# Patient Record
Sex: Male | Born: 1950
Health system: Southern US, Community
[De-identification: ages and names within clinical notes are randomized; demographics above are authoritative.]

## PROBLEM LIST (undated history)

## (undated) DIAGNOSIS — J309 Allergic rhinitis, unspecified: Secondary | ICD-10-CM

## (undated) DIAGNOSIS — T7840XA Allergy, unspecified, initial encounter: Secondary | ICD-10-CM

## (undated) DIAGNOSIS — M1990 Unspecified osteoarthritis, unspecified site: Secondary | ICD-10-CM

## (undated) DIAGNOSIS — F0390 Unspecified dementia without behavioral disturbance: Secondary | ICD-10-CM

## (undated) DIAGNOSIS — I10 Essential (primary) hypertension: Secondary | ICD-10-CM

## (undated) DIAGNOSIS — N189 Chronic kidney disease, unspecified: Secondary | ICD-10-CM

## (undated) DIAGNOSIS — E785 Hyperlipidemia, unspecified: Secondary | ICD-10-CM

## (undated) DIAGNOSIS — Z87442 Personal history of urinary calculi: Secondary | ICD-10-CM

## (undated) DIAGNOSIS — C61 Malignant neoplasm of prostate: Secondary | ICD-10-CM

## (undated) HISTORY — PX: TONSILLECTOMY: SHX5217

## (undated) HISTORY — DX: Hyperlipidemia, unspecified: E78.5

## (undated) HISTORY — DX: Malignant neoplasm of prostate: C61

## (undated) HISTORY — PX: EYE SURGERY: SHX253

## (undated) HISTORY — PX: OTHER SURGICAL HISTORY: SHX169

## (undated) HISTORY — DX: Essential (primary) hypertension: I10

## (undated) HISTORY — PX: JOINT REPLACEMENT: SHX530

## (undated) HISTORY — DX: Personal history of urinary calculi: Z87.442

## (undated) HISTORY — DX: Allergic rhinitis, unspecified: J30.9

## (undated) HISTORY — PX: TONSILLECTOMY: SUR1361

## (undated) HISTORY — PX: KNEE ARTHROSCOPY: SUR90

## (undated) HISTORY — PX: CATARACT EXTRACTION, BILATERAL: SHX1313

## (undated) HISTORY — DX: Allergy, unspecified, initial encounter: T78.40XA

## (undated) HISTORY — PX: HAND SURGERY: SHX662

## (undated) HISTORY — DX: Unspecified osteoarthritis, unspecified site: M19.90

## (undated) HISTORY — PX: SPINE SURGERY: SHX786

---

## 2005-04-03 ENCOUNTER — Ambulatory Visit: Payer: Self-pay

## 2007-02-22 DIAGNOSIS — C61 Malignant neoplasm of prostate: Secondary | ICD-10-CM

## 2007-02-22 HISTORY — DX: Malignant neoplasm of prostate: C61

## 2007-03-28 ENCOUNTER — Ambulatory Visit: Payer: Self-pay | Admitting: Gastroenterology

## 2008-11-23 HISTORY — PX: ELBOW SURGERY: SHX618

## 2010-08-13 ENCOUNTER — Ambulatory Visit: Payer: Self-pay | Admitting: Unknown Physician Specialty

## 2010-08-14 LAB — PATHOLOGY REPORT

## 2013-01-26 ENCOUNTER — Ambulatory Visit: Payer: Self-pay

## 2013-01-26 IMAGING — CR DG CHEST 2V
1 series · 3 of 3 positions shown · non-contrast
Comparison: none

REASON FOR EXAM: chest pain
COMMENTS:

[Series 1: pa · 0.17mm/px · 3 of 3 slices shown]
[im 1/3]
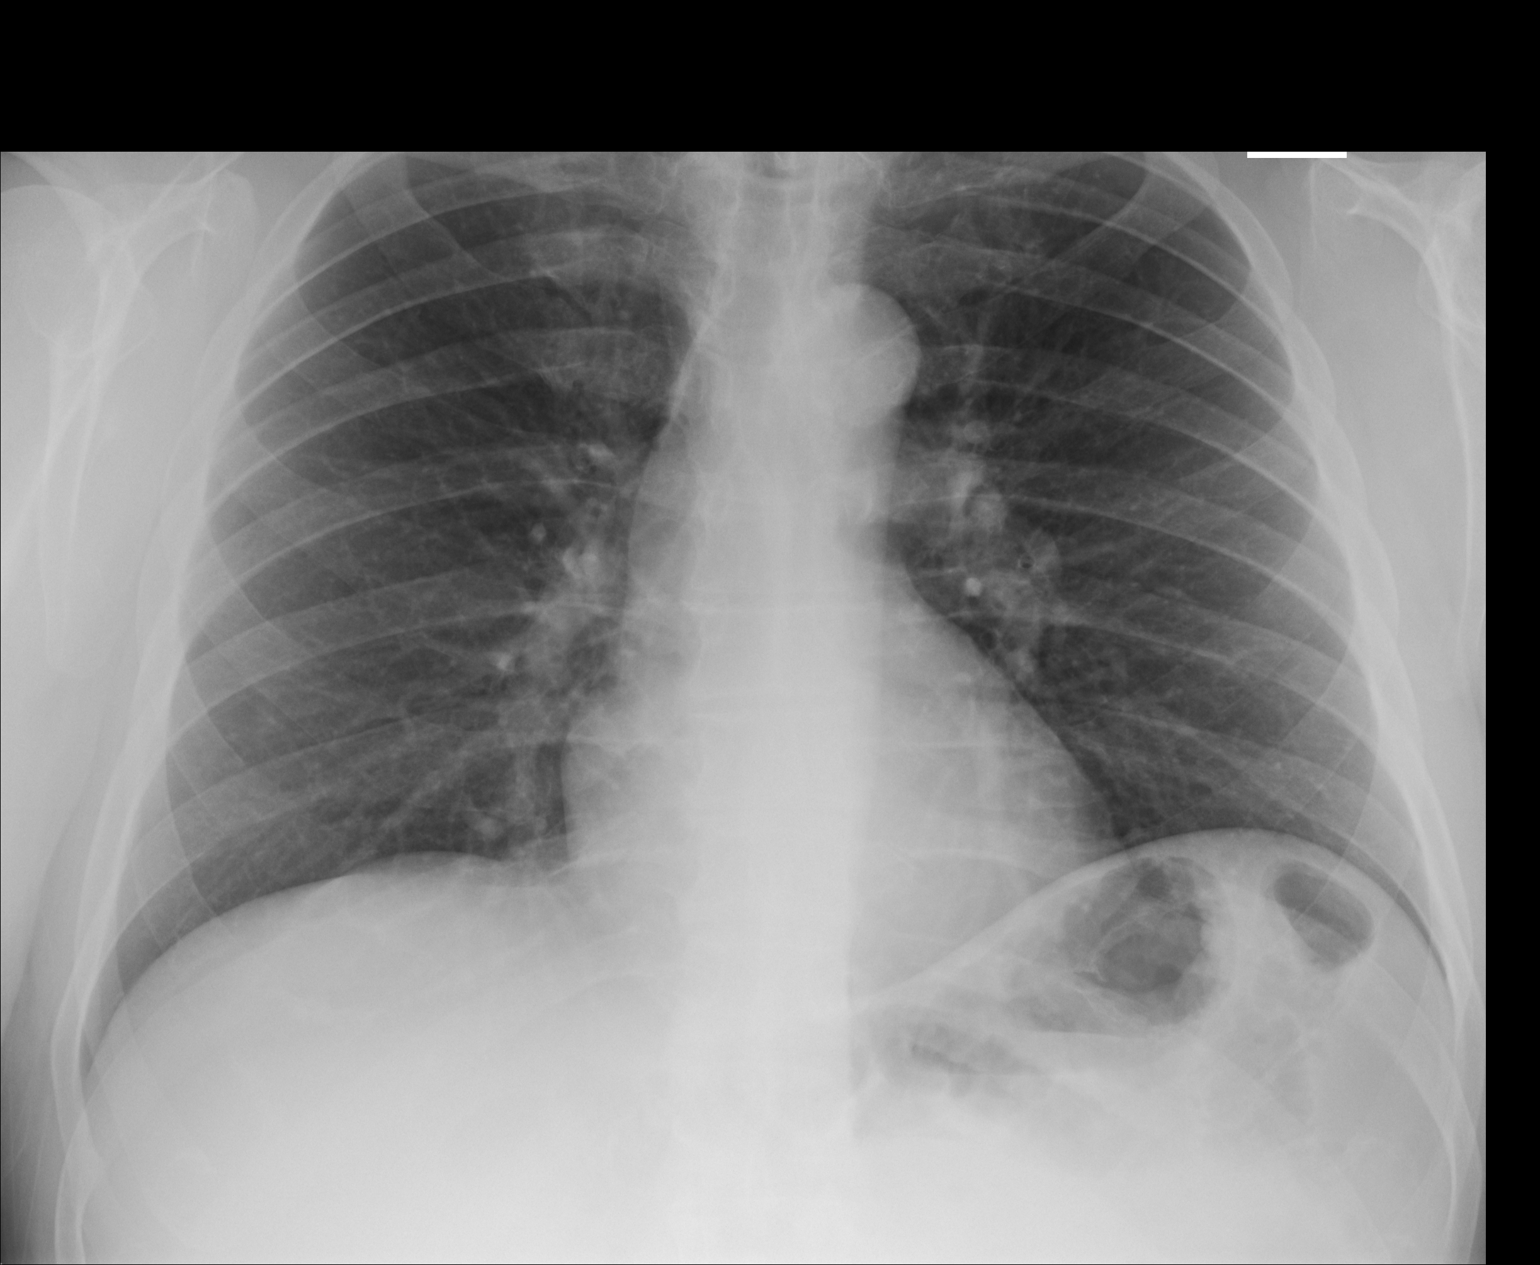
[im 2/3]
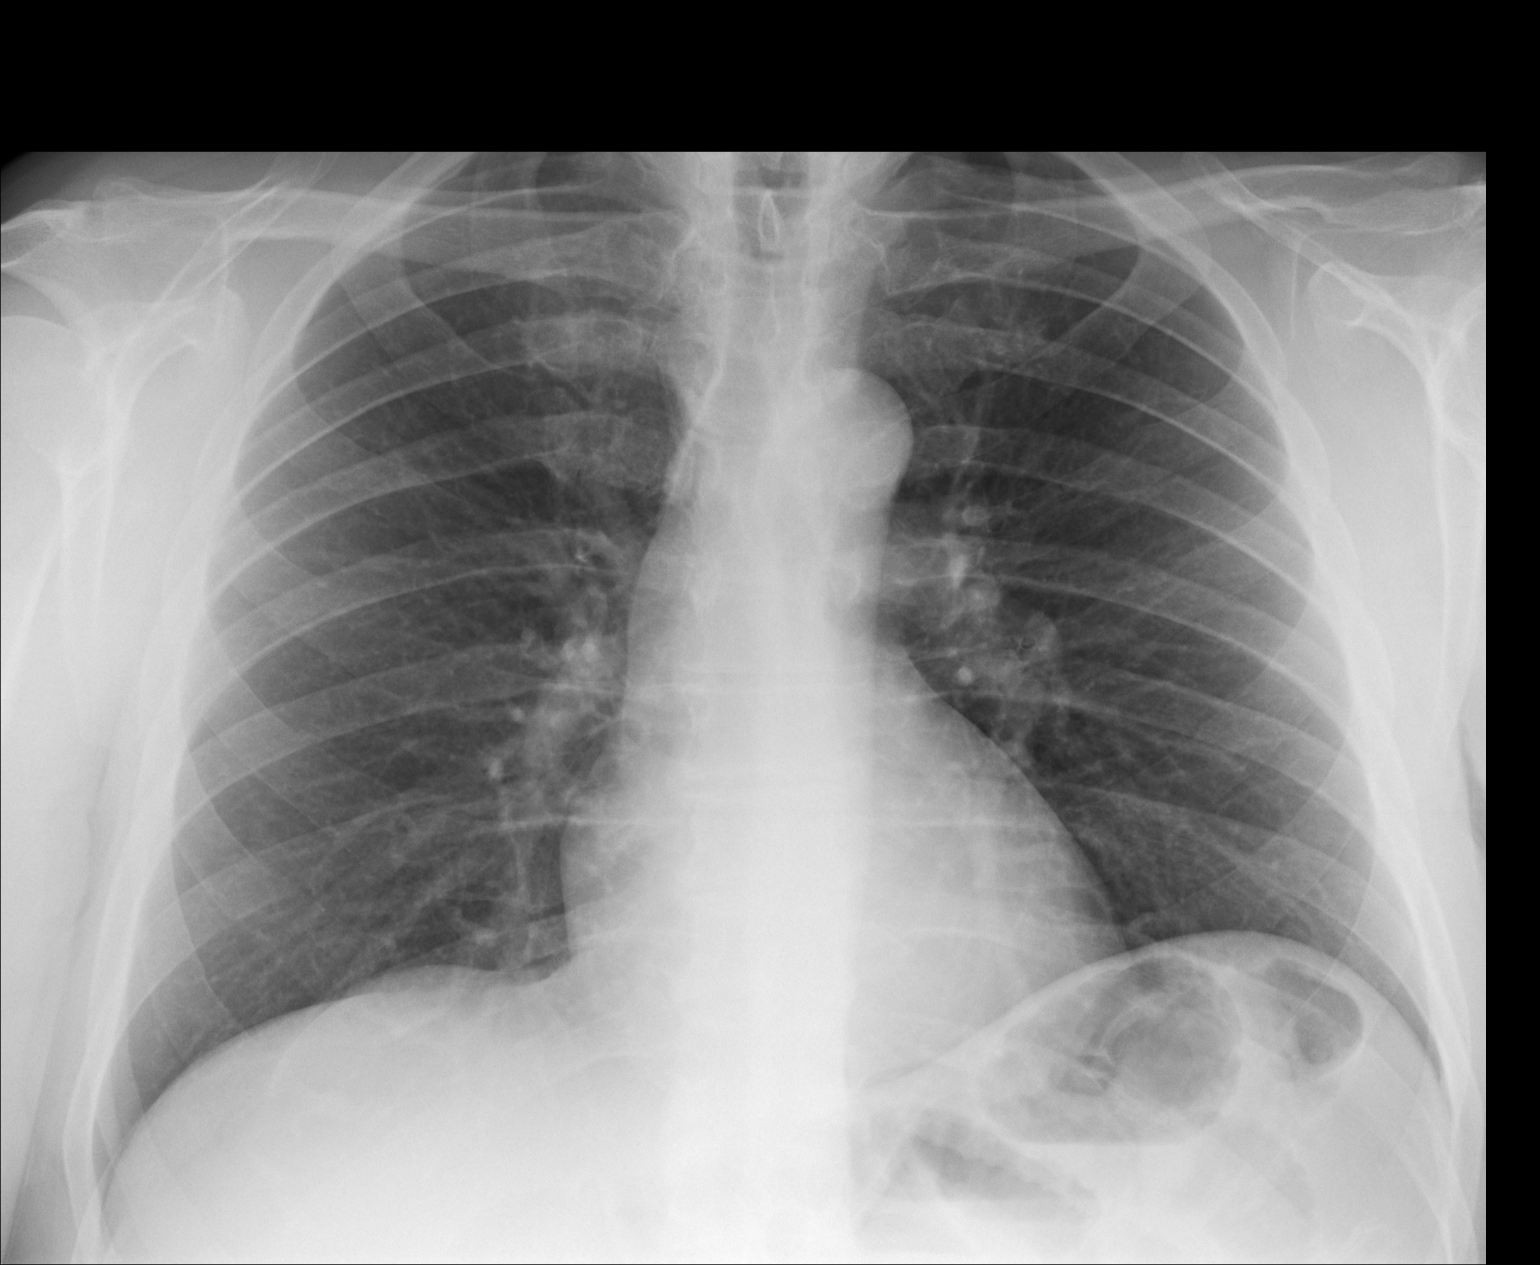
[im 3/3]
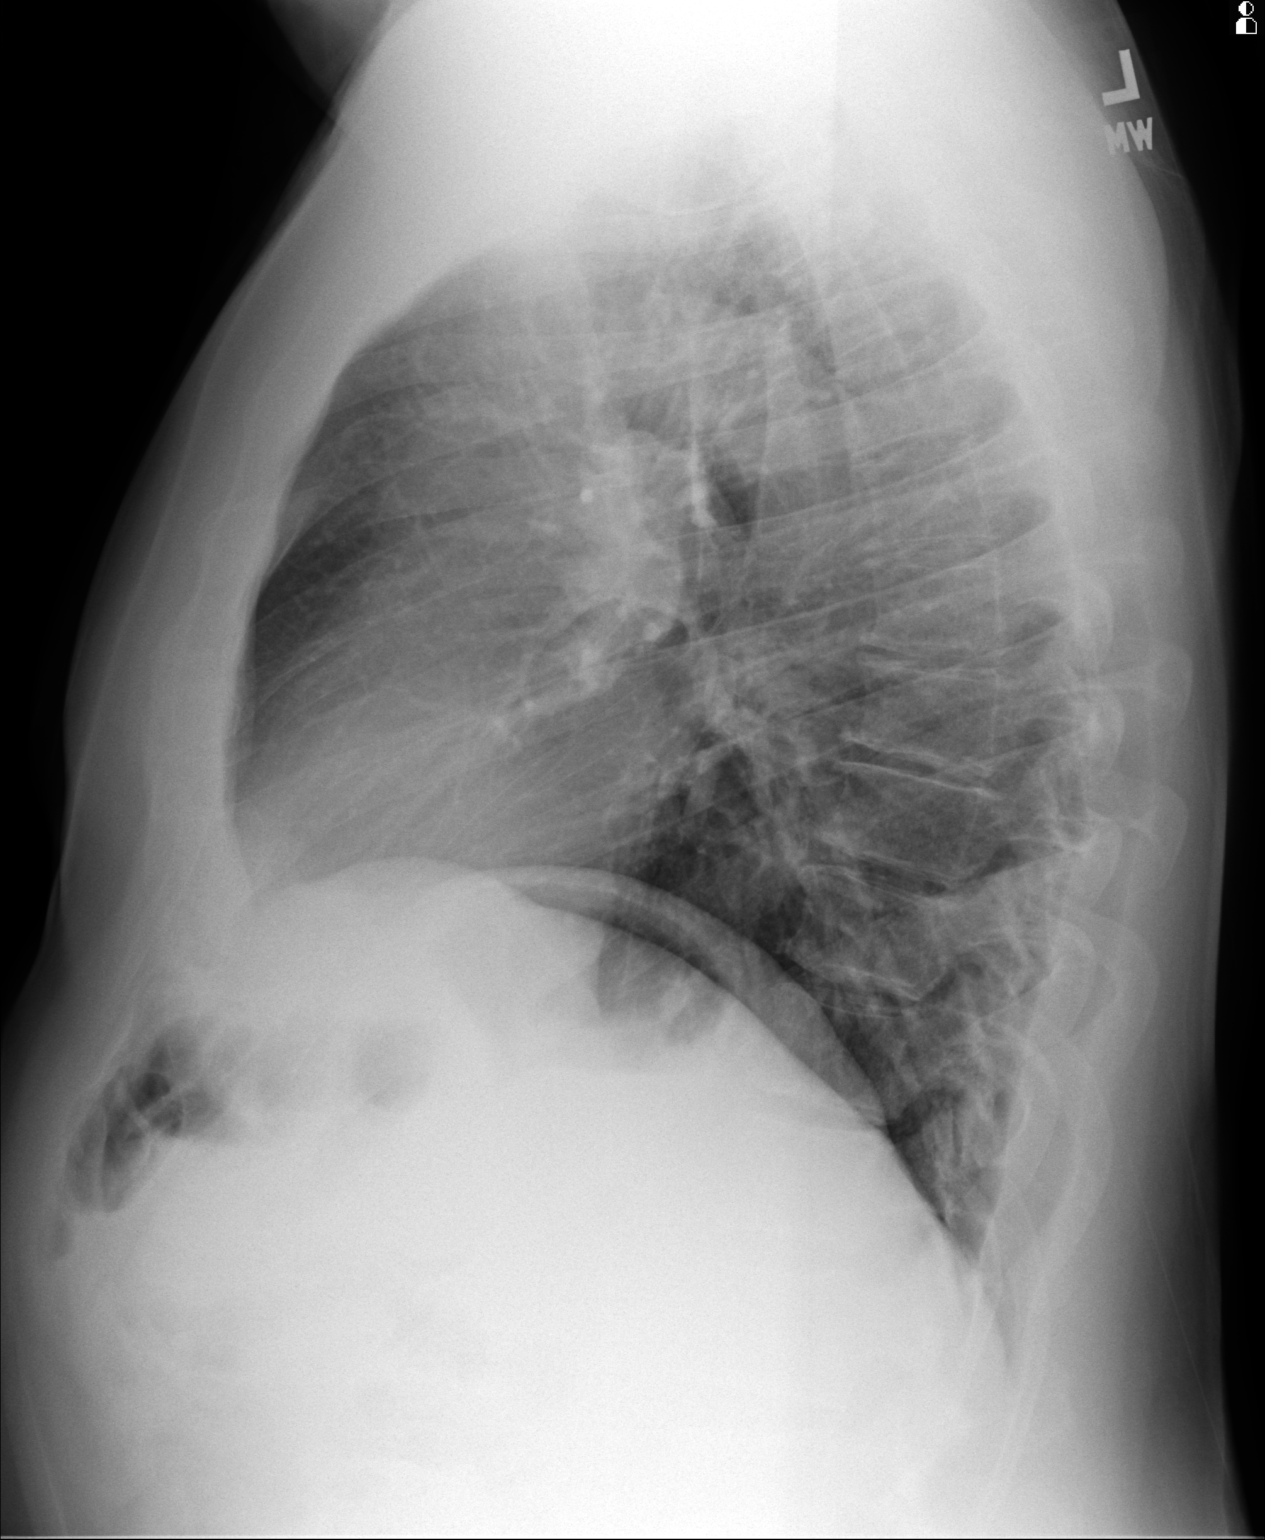

[3 of 3 positions shown; findings below may reference images not displayed]

PROCEDURE:     EDGAR MARCELO - EDGAR MARCELO CHEST PA (OR AP) AND LAT  - [DATE] [DATE]

RESULT:     The lungs are clear. The heart and pulmonary vessels are normal.
The bony and mediastinal structures are unremarkable. There is no effusion.
There is no pneumothorax or evidence of congestive failure.
IMPRESSION: No acute cardiopulmonary disease.

[REDACTED]

## 2013-05-05 ENCOUNTER — Ambulatory Visit: Payer: Self-pay | Admitting: Family Medicine

## 2013-05-05 IMAGING — CR DG LUMBAR SPINE 2-3V
1 series · 6 of 6 positions shown · non-contrast
Comparison: none

REASON FOR EXAM: hematuria rt facet arthopathy
COMMENTS:

[Series 1: ap · 0.17mm/px · 6 of 6 slices shown]
[im 1/6]
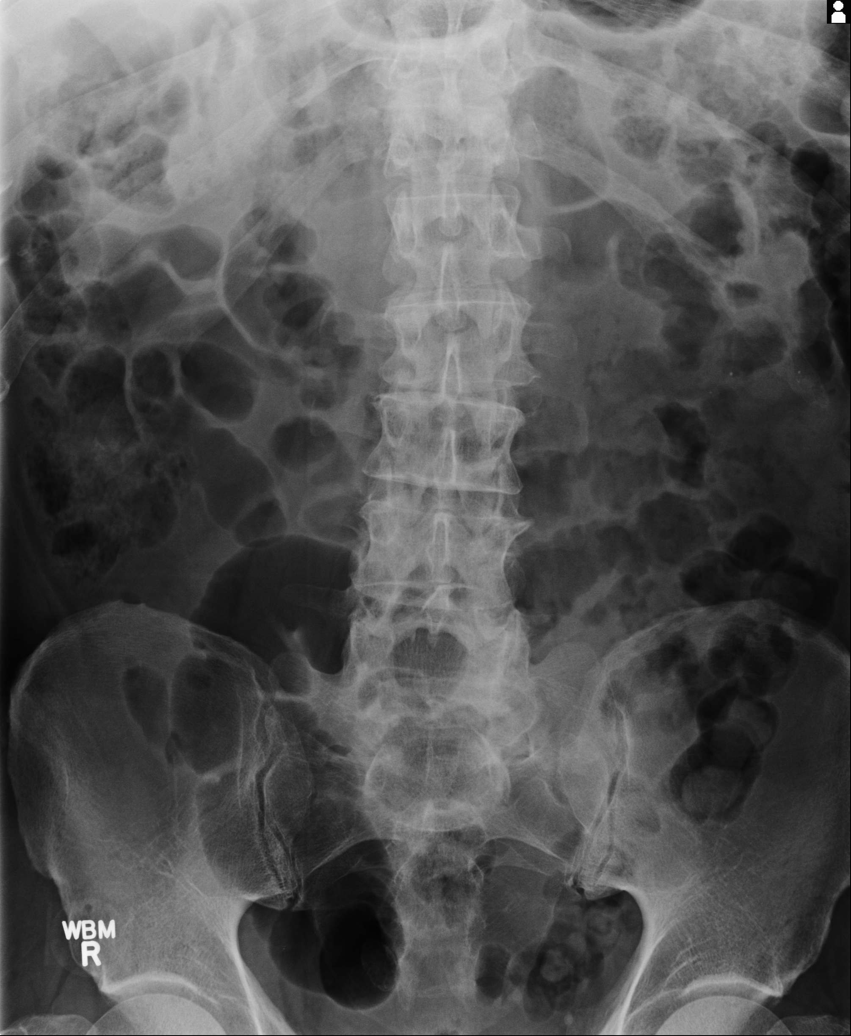
[im 2/6]
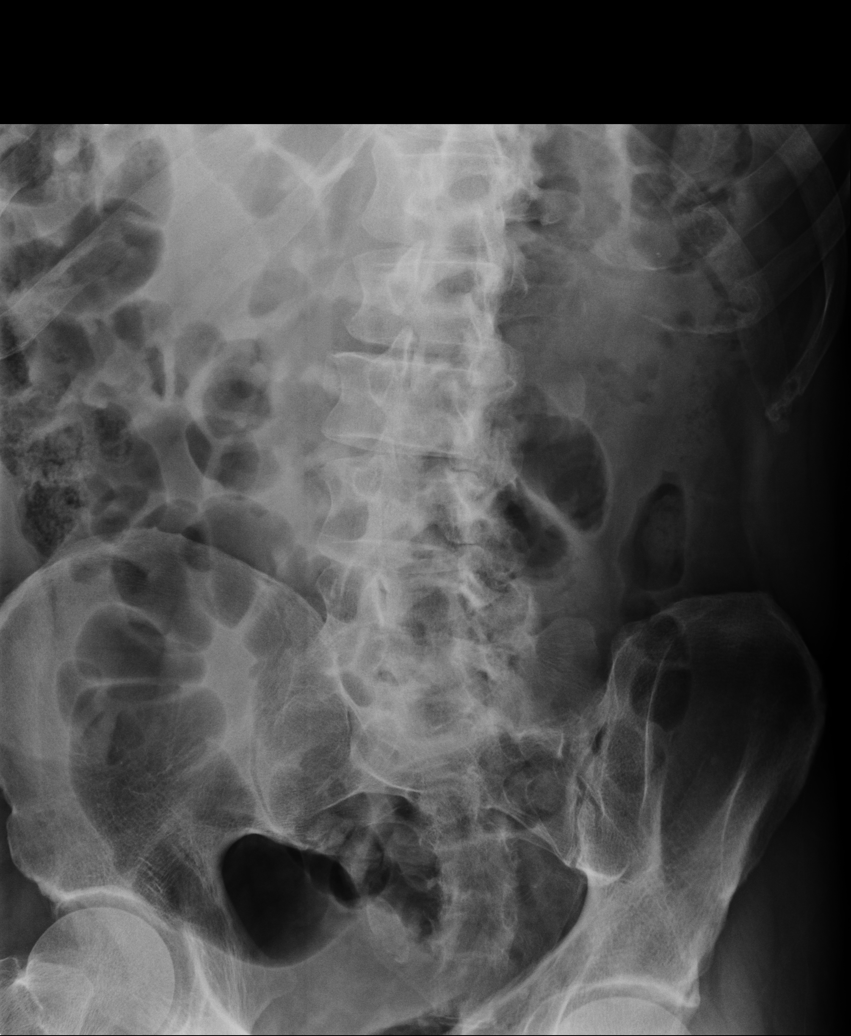
[im 3/6]
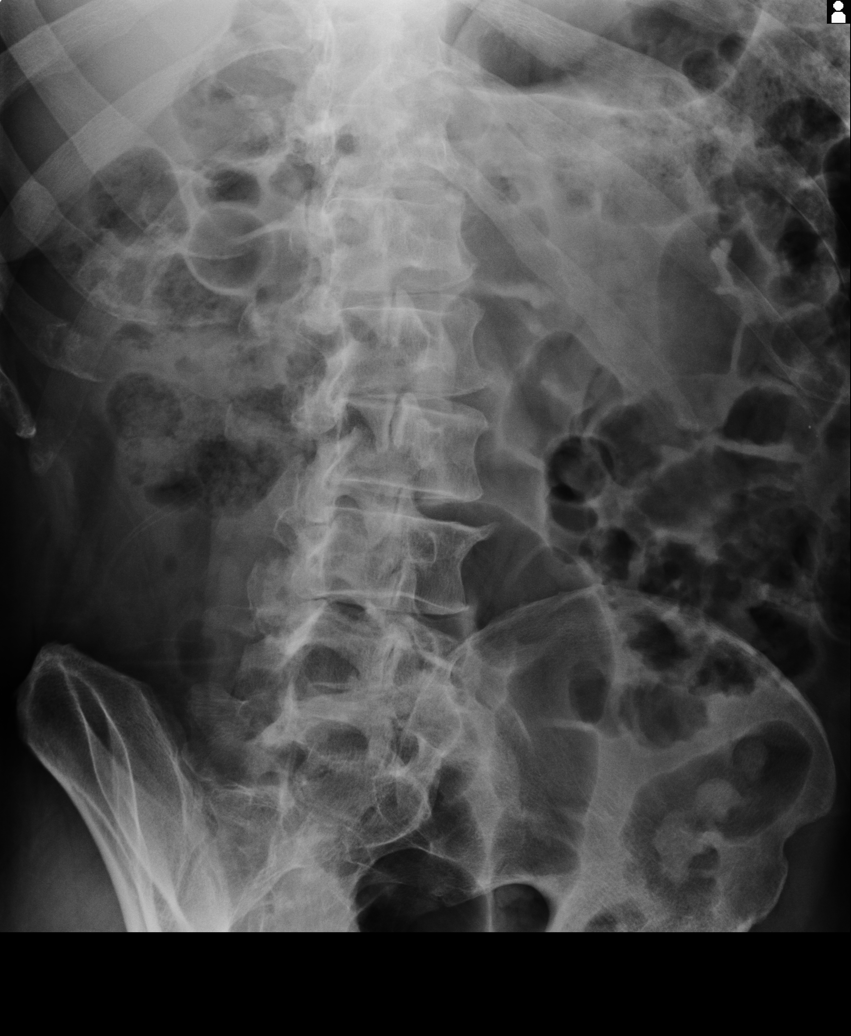
[im 4/6]
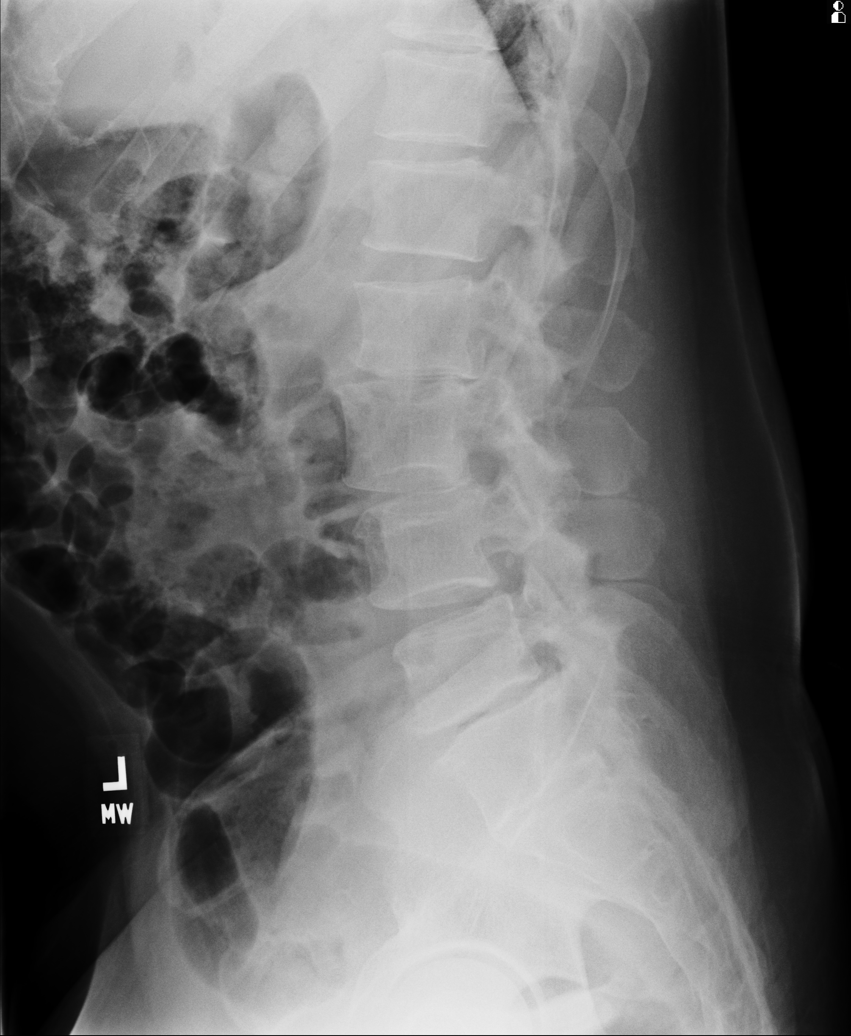
[im 5/6]
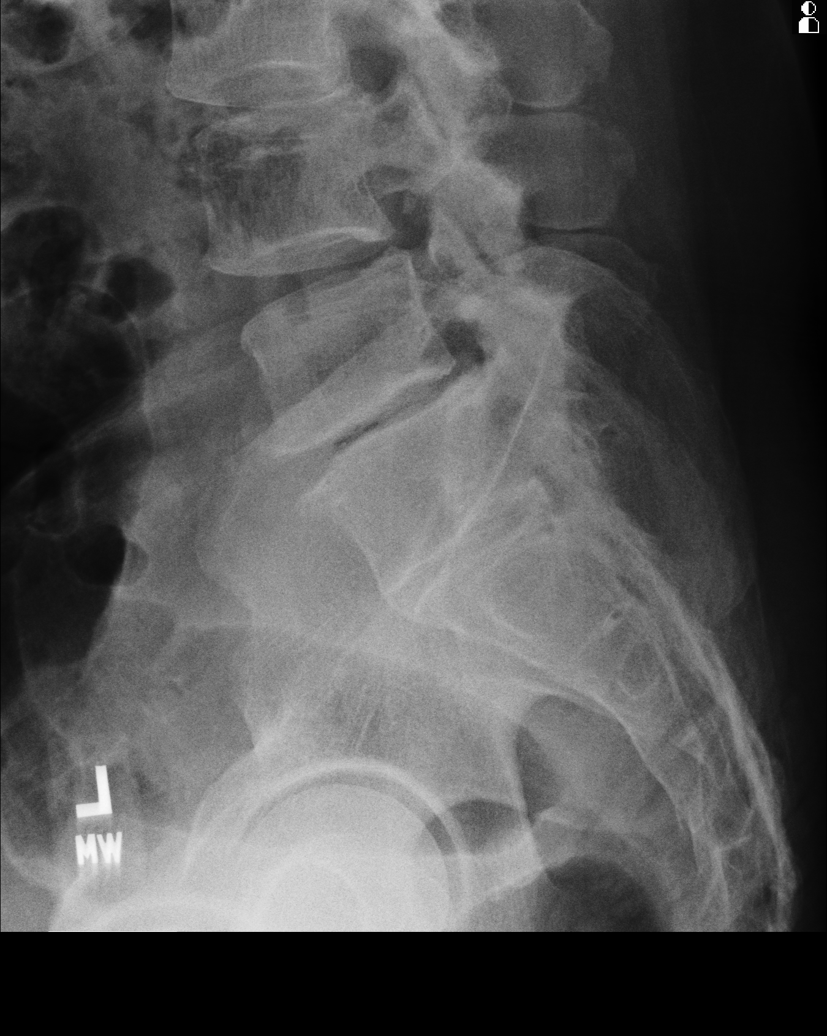
[im 6/6]
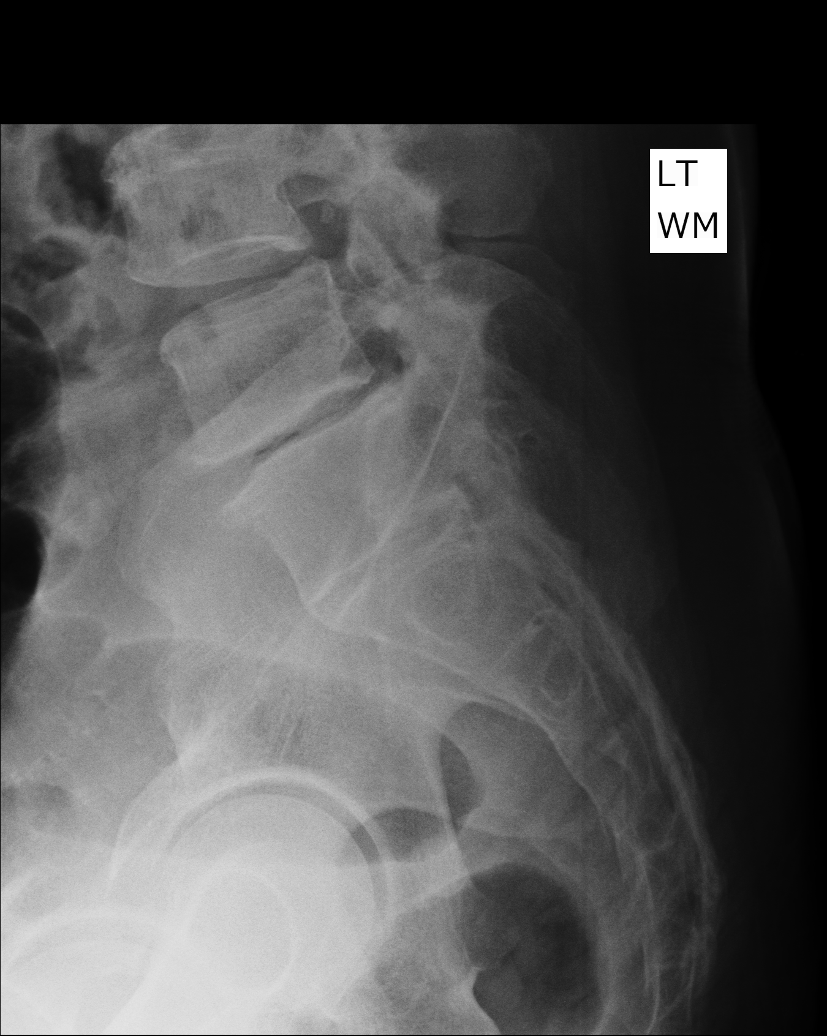

[6 of 6 positions shown; findings below may reference images not displayed]

PROCEDURE:     KUHALAINEN - KUHALAINEN LUMBAR SPINE AP AND LATERAL  - [DATE] [DATE]

RESULT:     The lumbar vertebral bodies are preserved in height. There is
disc space narrowing at L4-L5 and at L5-S1. There is minimal grade 1
anterolisthesis of L2 with respect to L1 likely on the basis of facet joint
degenerative change. The spinous processes appear intact. The pedicles and
transverse processes appear normal where visualized. S1 is transitional.
IMPRESSION: There are degenerative changes of the lumbar spine at
multiple levels. Followup MRI may be useful when the patient can tolerate
the procedure.

[REDACTED]

## 2013-05-05 IMAGING — CR DG ABDOMEN 1V
1 series · 1 of 1 positions shown · non-contrast
Comparison: none

REASON FOR EXAM: hematuria rt facet arthopathy
COMMENTS:

[ap]
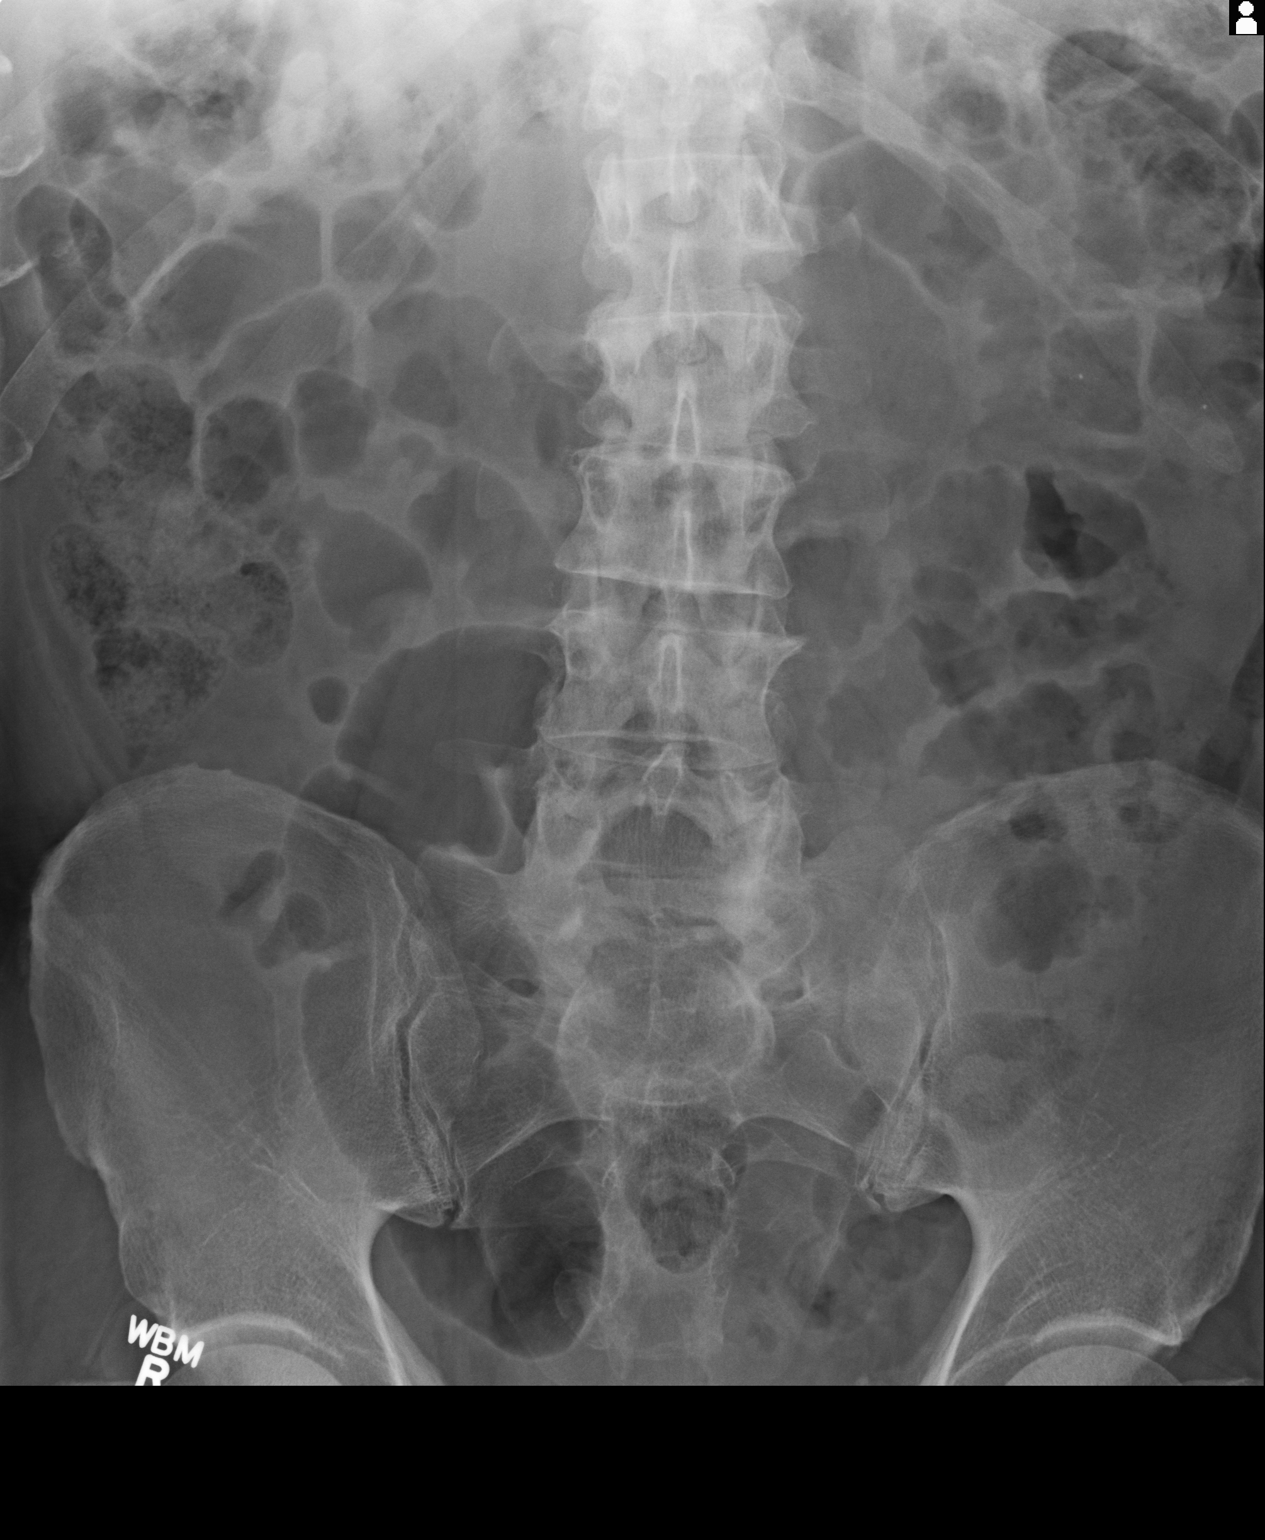

[1 of 1 positions shown; findings below may reference images not displayed]

PROCEDURE:     TIGER - TIGER KIDNEY URETER BLADDER  - [DATE] [DATE]

RESULT:     There is a large amount of gas within small bowel loops and to a
normal appearance of gas and stool within the colon. The pattern does not
suggest obstruction. There are faint calcific densities projecting over the
left mid abdomen which may lie within the kidney but could lie outside. I do
not see definite evidence of stones along the expected course of the
ureters. There are calcifications to the left of midline in the pelvis which
may reflect phleboliths. There are degenerative changes of the lumbar spine.
IMPRESSION: 1. There are calcifications on the left which may reflect urinary tract
stones but this is not a definite finding. Followup noncontrast abdominal
and pelvic CT scanning is recommended.
2. There is considerable gas within small bowel loops. The pattern does not
suggest obstruction however.
3. There are degenerative changes of the lumbar spine.

[REDACTED]

## 2013-05-11 ENCOUNTER — Ambulatory Visit: Payer: Self-pay | Admitting: Family Medicine

## 2013-05-11 IMAGING — CT CT ABD-PELV W/O CM
1 of 2 series · 15 of 32 positions shown, 20 images · non-contrast
Comparison: none

REASON FOR EXAM: Abn KUB Calcifications on Lt Kidney
COMMENTS:

PROCEDURE:     KCT - KCT ABDOMEN/PELVIS WO  - [DATE]  [DATE]
RESULT:     History: Renal stones.
Comparison Study: KUB of [DATE].

[Series 2: abd 3mm wo 3.0 i40f 3 · axial · 0.91mm/px · z∈[-1108,-658]mm · 15 of 166 slices shown, 20 images]
[im 8/166  soft-tissue]
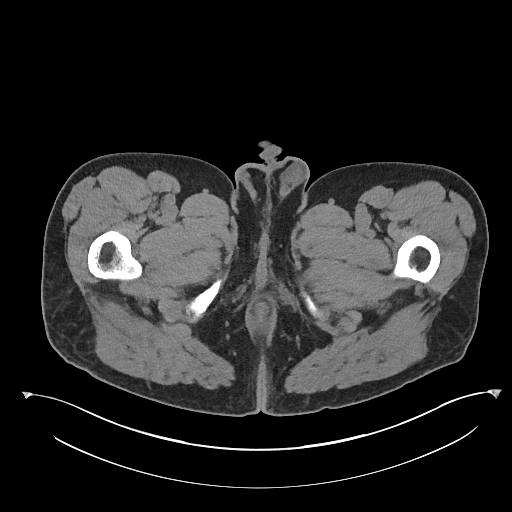
[im 8/166  bone]
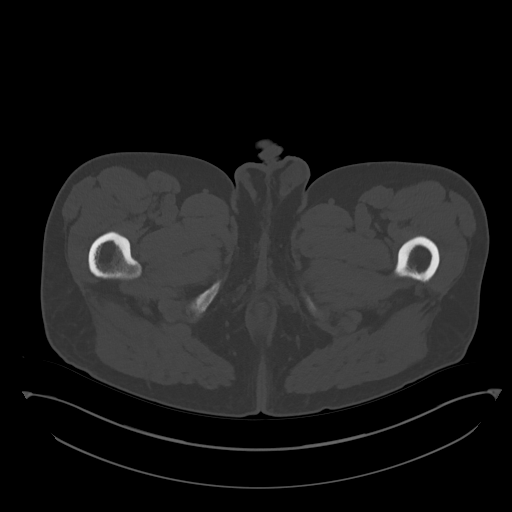
[im 22/166  soft-tissue]
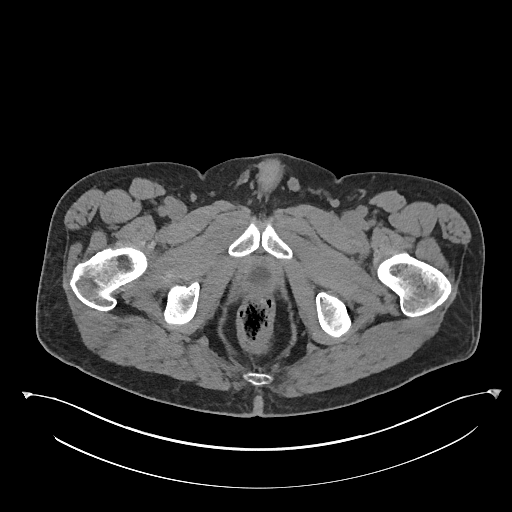
[im 29/166  soft-tissue]
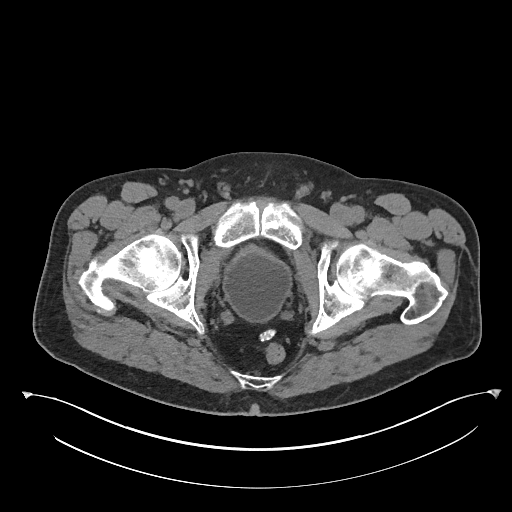
[im 44/166  soft-tissue]
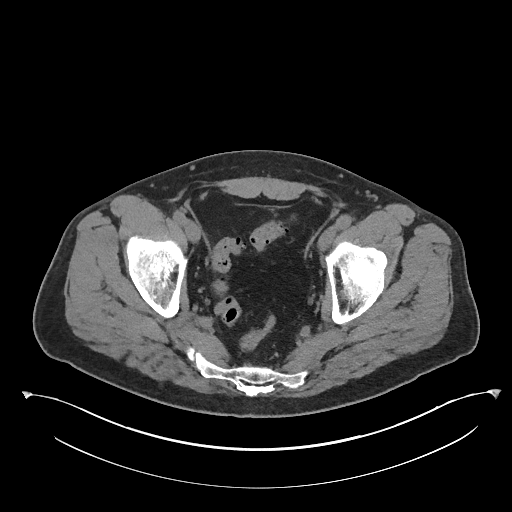
[im 58/166  soft-tissue]
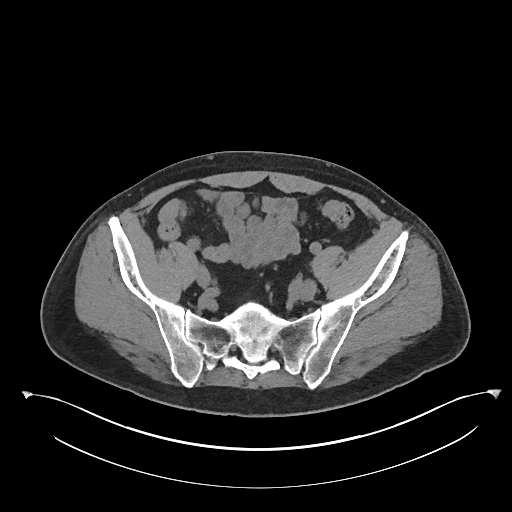
[im 65/166  soft-tissue]
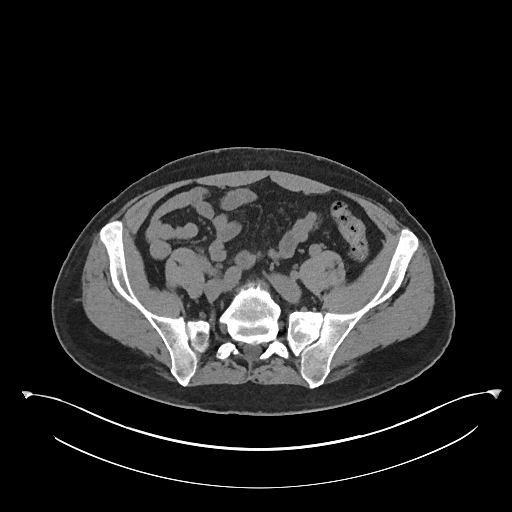
[im 79/166  soft-tissue]
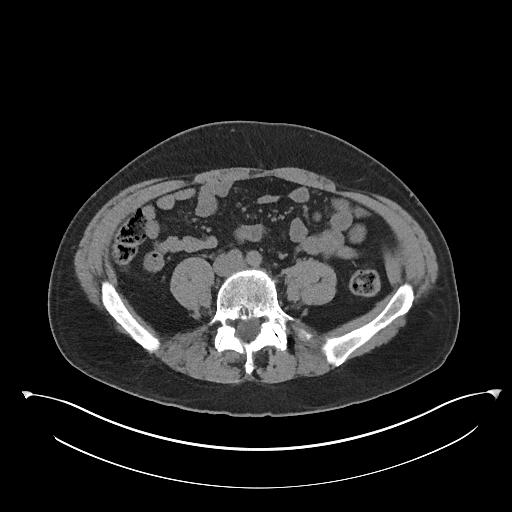
[im 87/166  soft-tissue]
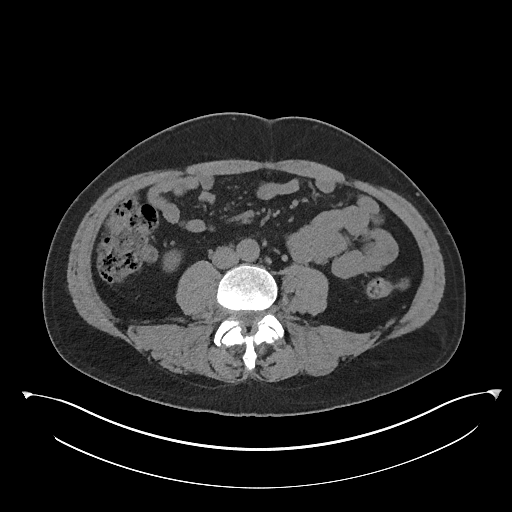
[im 101/166  soft-tissue]
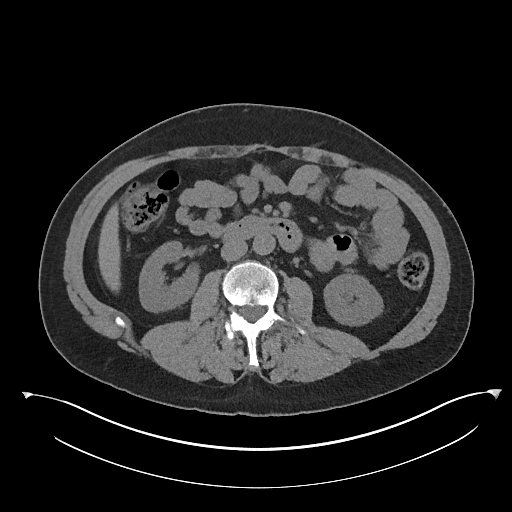
[im 101/166  bone]
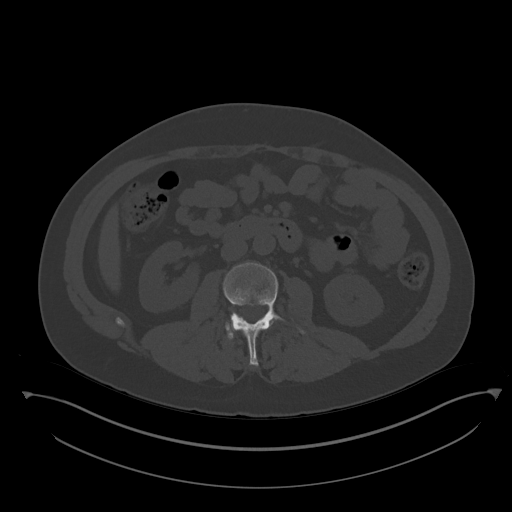
[im 108/166  soft-tissue]
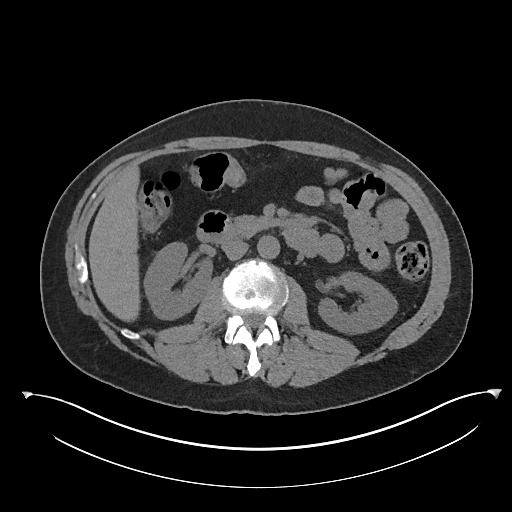
[im 122/166  soft-tissue]
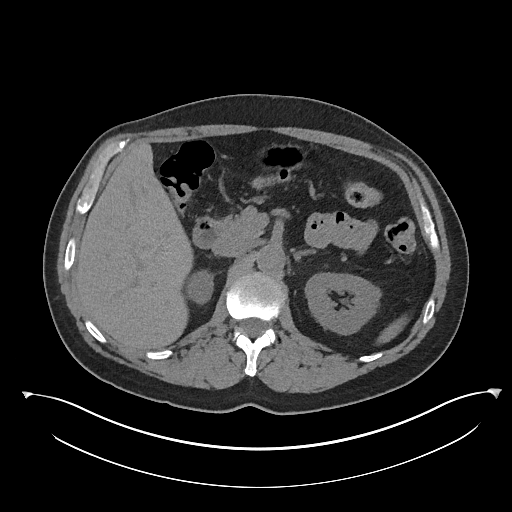
[im 137/166  soft-tissue]
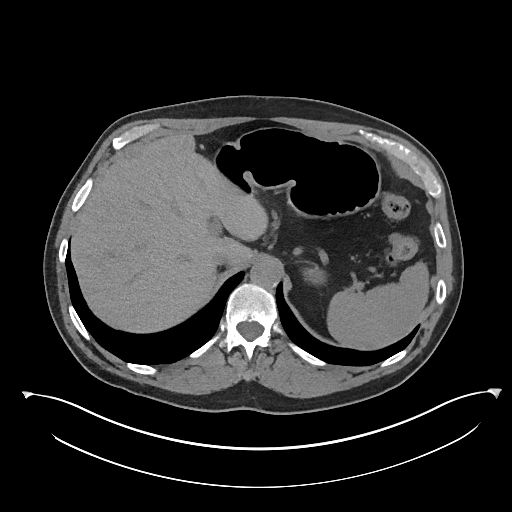
[im 137/166  lung]
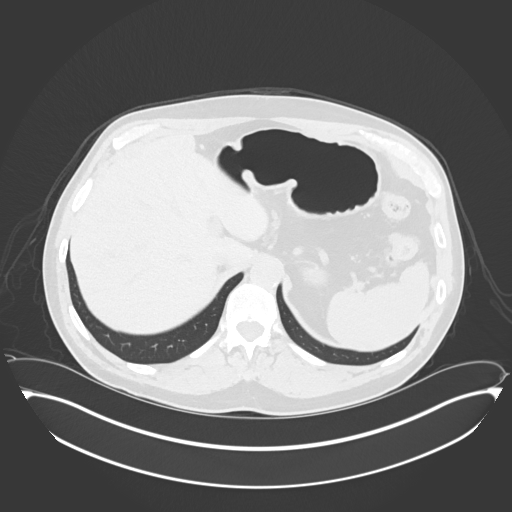
[im 144/166  soft-tissue]
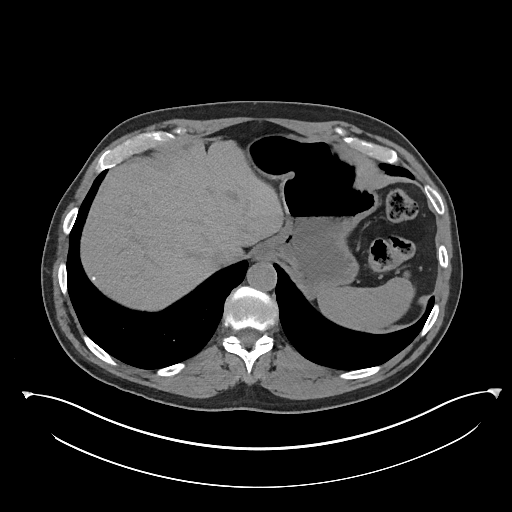
[im 144/166  lung]
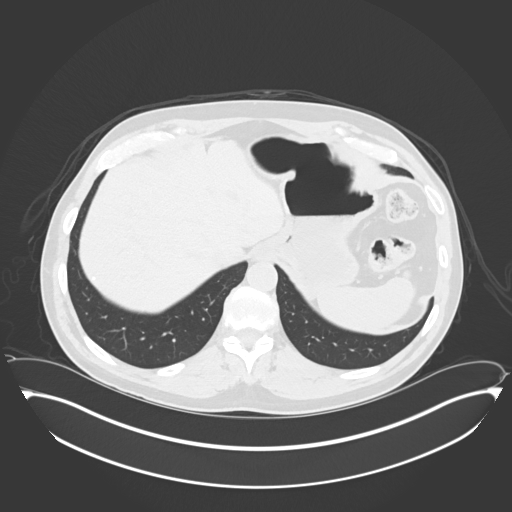
[im 151/166  lung]
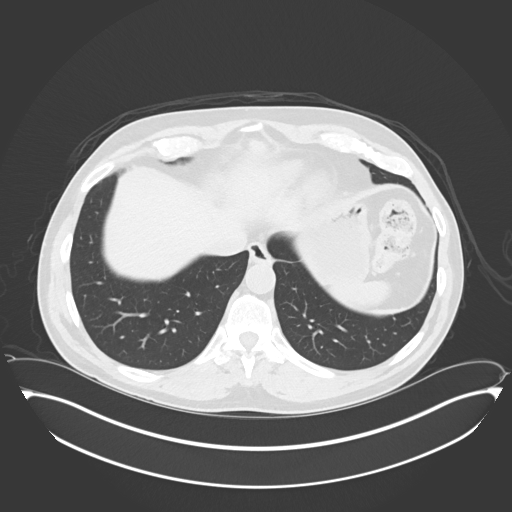
[im 158/166  soft-tissue]
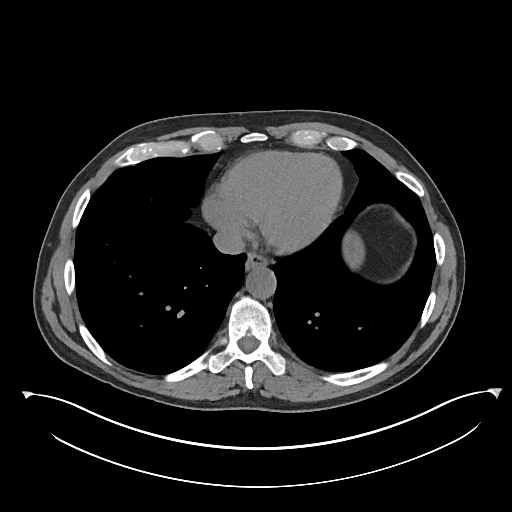
[im 158/166  lung]
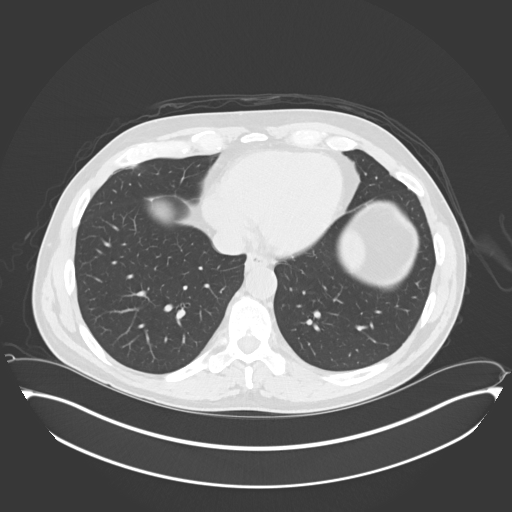

[15 of 32 positions shown; findings below may reference images not displayed]

FINDINGS: Standard nonenhanced CT obtained. Evaluation 3 dimensions
performed. Liver normal. Spleen normal. Gallbladder nondistended. No biliary
distention. Pancreas is normal. Adrenals normal. Kidneys normal. No evidence
of ureteral obstruction or hydronephrosis. Minimal thickening of the bladder
wall is present. The bladder is nondistended. Process such as cystitis
cannot be excluded. Appendix is normal. No bowel distention. No free air.
Lung bases are clear. Aorta normal caliber. No significant adenopathy. No
acute bony abnormality. Degenerative change of the spine and both hips.
Sacral bone island noted.
IMPRESSION: Mild thickened bladder wall. Cystitis cannot be excluded.

## 2014-08-29 ENCOUNTER — Ambulatory Visit: Payer: Self-pay | Admitting: Podiatry

## 2015-01-17 ENCOUNTER — Ambulatory Visit: Payer: Self-pay | Admitting: Family Medicine

## 2015-02-04 ENCOUNTER — Ambulatory Visit: Payer: Self-pay | Admitting: Physician Assistant

## 2015-02-04 IMAGING — CR LEFT THUMB 2+V
3 series · 3 of 3 positions shown · non-contrast
Comparison: None.

CLINICAL DATA: 63-year-old male with increased seen are eminence
pain secondary to over use. Initial encounter.

EXAM:
LEFT THUMB 2+V

[finger ap]
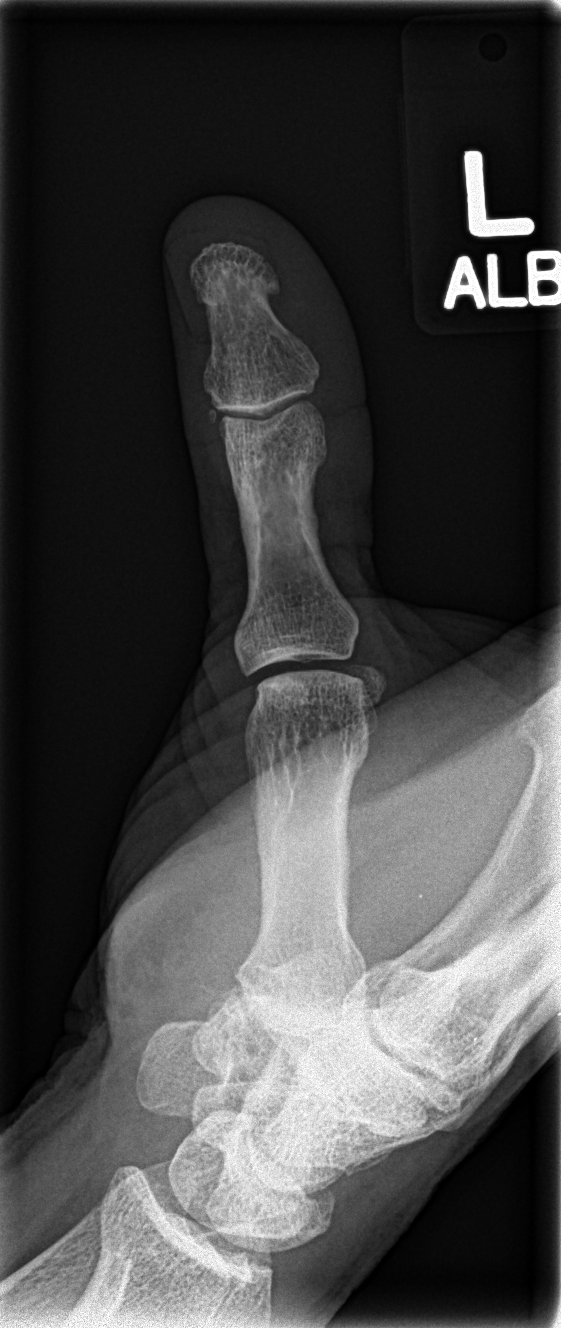

[finger obl]
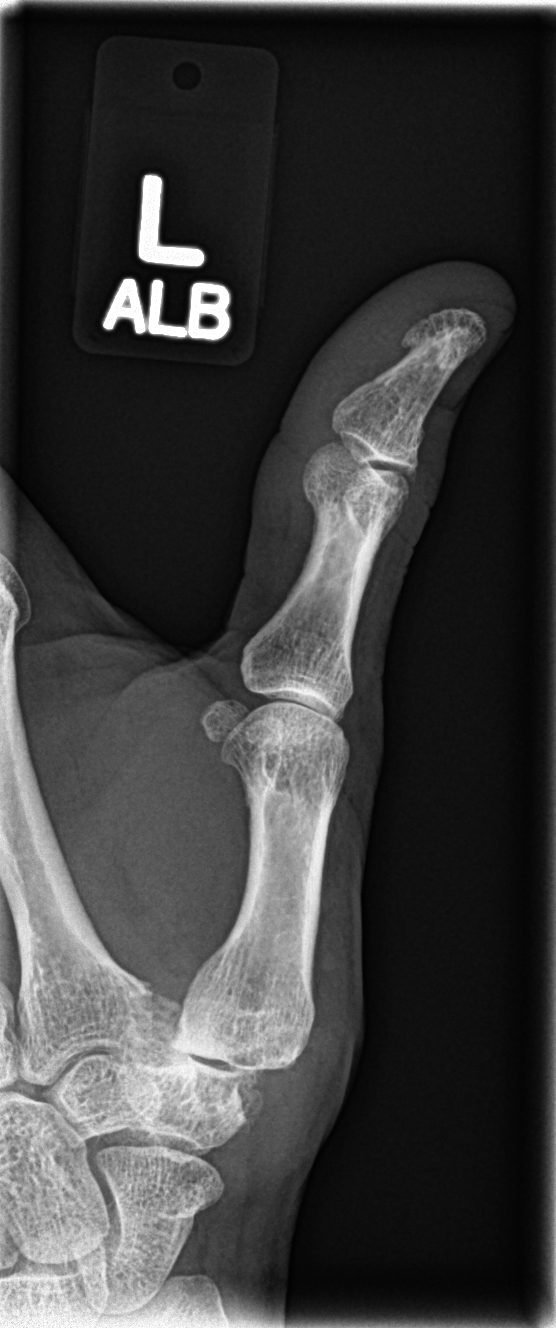

[finger lat]
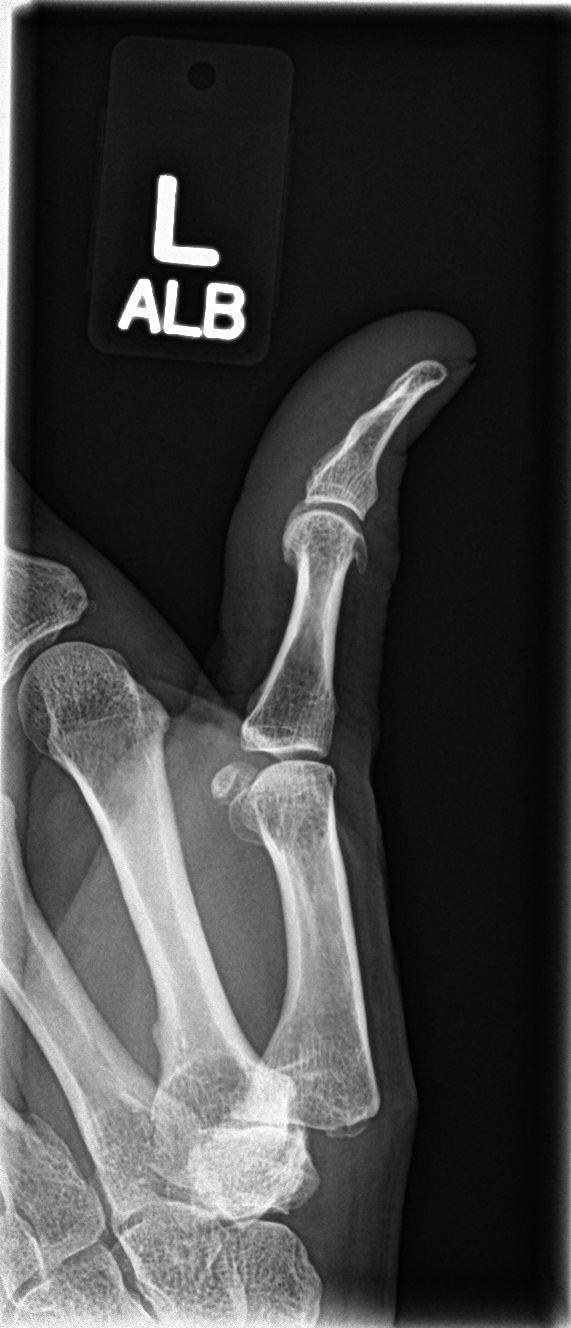

[3 of 3 positions shown; findings below may reference images not displayed]

FINDINGS: Bone mineralization is within normal limits for age. Degenerative
changes at the basal joint of the left thumb and also at the left
thumb IP joint, with subchondral sclerosis and osteophytosis. No
acute fracture or dislocation identified. No other arthropathic
changes in the visible osseous structures.
IMPRESSION: Osteoarthritis involving the basal joint and IP joint of the left
thumb.

## 2015-02-04 IMAGING — CR RIGHT THUMB 2+V
3 series · 3 of 3 positions shown · non-contrast
Comparison: Contralateral left thumb series from today reported
separately.

CLINICAL DATA: 63-year-old male with increasing pain right thenar
eminence. Initial encounter.

EXAM:
RIGHT THUMB 2+V

[finger ap]
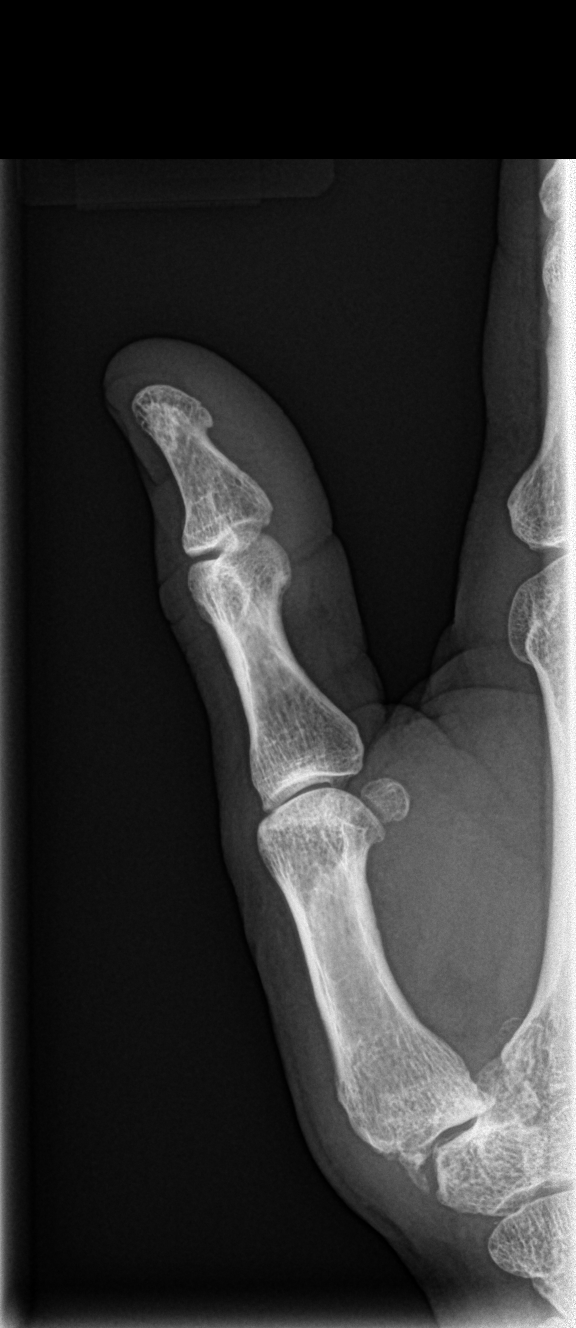

[finger obl]
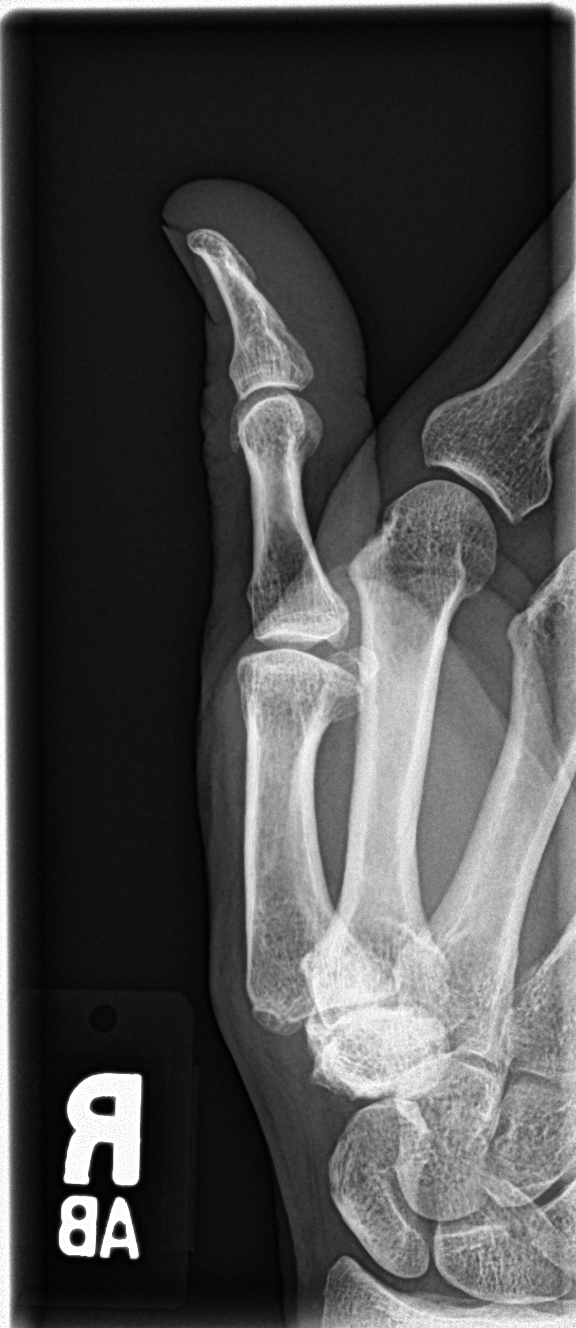

[finger lat]
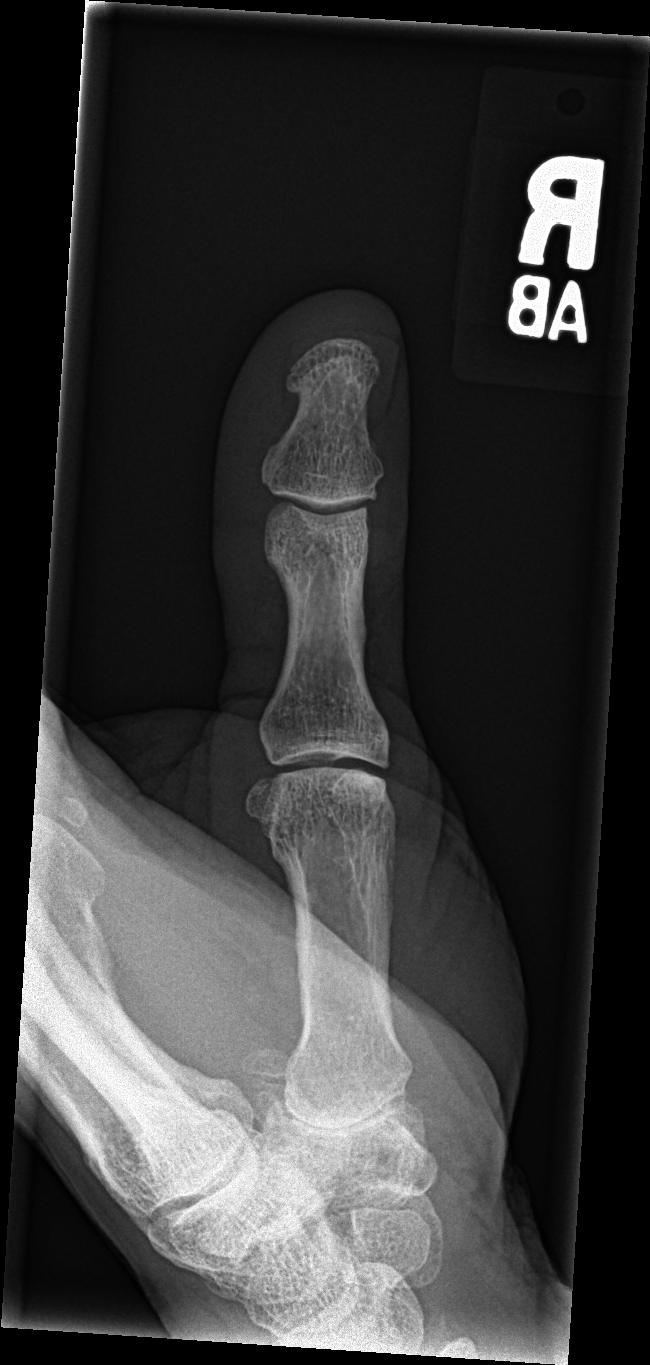

[3 of 3 positions shown; findings below may reference images not displayed]

FINDINGS: Bone mineralization is within normal limits. Degenerative changes at
the basal (carpometacarpal) joint of the right thumb with joint
space loss, osteophytosis and subchondral sclerosis. Comparatively
mild degenerative changes at the right thumb IP joint. No acute
fracture or dislocation identified. No other arthropathic changes
identified.
IMPRESSION: Posterior arthritis of the right thumb primarily affecting the basal
joint.

## 2015-02-06 ENCOUNTER — Ambulatory Visit: Payer: Self-pay | Admitting: Physician Assistant

## 2015-03-01 ENCOUNTER — Ambulatory Visit
Admit: 2015-03-01 | Disposition: A | Discharge status: Home / Self Care | Payer: Self-pay | Attending: Family Medicine | Admitting: Family Medicine

## 2015-03-27 DIAGNOSIS — J309 Allergic rhinitis, unspecified: Secondary | ICD-10-CM | POA: Insufficient documentation

## 2015-03-27 DIAGNOSIS — I10 Essential (primary) hypertension: Secondary | ICD-10-CM | POA: Insufficient documentation

## 2015-03-27 DIAGNOSIS — Z87442 Personal history of urinary calculi: Secondary | ICD-10-CM | POA: Insufficient documentation

## 2015-03-27 DIAGNOSIS — E785 Hyperlipidemia, unspecified: Secondary | ICD-10-CM | POA: Insufficient documentation

## 2015-03-27 DIAGNOSIS — C61 Malignant neoplasm of prostate: Secondary | ICD-10-CM | POA: Insufficient documentation

## 2015-03-27 DIAGNOSIS — Z8546 Personal history of malignant neoplasm of prostate: Secondary | ICD-10-CM | POA: Insufficient documentation

## 2015-05-09 ENCOUNTER — Encounter: Payer: Self-pay | Admitting: Family Medicine

## 2015-05-09 ENCOUNTER — Ambulatory Visit (INDEPENDENT_AMBULATORY_CARE_PROVIDER_SITE_OTHER): Payer: Private Health Insurance - Indemnity | Admitting: Family Medicine

## 2015-05-09 VITALS — BP 121/68 | HR 56 | Temp 98.0°F | Ht 72.5 in | Wt 212.0 lb

## 2015-05-09 DIAGNOSIS — I10 Essential (primary) hypertension: Secondary | ICD-10-CM | POA: Diagnosis not present

## 2015-05-09 DIAGNOSIS — Z0000 Encounter for general adult medical examination without abnormal findings: Secondary | ICD-10-CM

## 2015-05-09 DIAGNOSIS — E785 Hyperlipidemia, unspecified: Secondary | ICD-10-CM | POA: Diagnosis not present

## 2015-05-09 DIAGNOSIS — J302 Other seasonal allergic rhinitis: Secondary | ICD-10-CM | POA: Diagnosis not present

## 2015-05-09 LAB — URINALYSIS, ROUTINE W REFLEX MICROSCOPIC
BILIRUBIN UA: NEGATIVE
GLUCOSE, UA: NEGATIVE
KETONES UA: NEGATIVE
Leukocytes, UA: NEGATIVE
Nitrite, UA: NEGATIVE
PH UA: 5.5 (ref 5.0–7.5)
Protein, UA: NEGATIVE
Specific Gravity, UA: 1.025 (ref 1.005–1.030)
Urobilinogen, Ur: 0.2 mg/dL (ref 0.2–1.0)

## 2015-05-09 LAB — MICROSCOPIC EXAMINATION

## 2015-05-09 MED ORDER — LISINOPRIL 10 MG PO TABS: 10.0000 mg | ORAL_TABLET | Freq: Every day | ORAL | Status: DC

## 2015-05-09 MED ORDER — FLUTICASONE PROPIONATE 50 MCG/ACT NA SUSP: 2.0000 | Freq: Every day | NASAL | Status: DC

## 2015-05-09 NOTE — Assessment & Plan Note (Signed)
The current medical regimen is effective;  continue present plan and medications.  

## 2015-05-09 NOTE — Progress Notes (Signed)
BP 121/68 mmHg  Pulse 56  Temp(Src) 98 F (36.7 C)  Ht 6' 0.5" (1.842 m)  Wt 212 lb (96.163 kg)  BMI 28.34 kg/m2  SpO2 98%   Subjective:    Patient ID: Christopher Huang, male    DOB: 11/01/1951, 64 y.o.   MRN: [id number]  HPI: Christopher Huang is a 64 y.o. male  Chief Complaint  Patient presents with  . Annual Exam  Doing well but over the last few mo has had word finding issues with meds. Concerned simvastatin causing. BP doing well  Relevant past medical, surgical, family and social history reviewed and updated as indicated. Interim medical history since our last visit reviewed. Allergies and medications reviewed and updated.  Review of Systems  Constitutional: Negative.   HENT: Negative.   Eyes: Negative.   Respiratory: Negative.   Cardiovascular: Negative.   Endocrine: Negative.   Musculoskeletal: Negative.   Skin: Negative.   Allergic/Immunologic: Negative.   Neurological: Negative.   Hematological: Negative.   Psychiatric/Behavioral: Negative.     Per HPI unless specifically indicated above     Objective:    BP 121/68 mmHg  Pulse 56  Temp(Src) 98 F (36.7 C)  Ht 6' 0.5" (1.842 m)  Wt 212 lb (96.163 kg)  BMI 28.34 kg/m2  SpO2 98%  Wt Readings from Last 3 Encounters:  05/09/15 212 lb (96.163 kg)  11/06/14 221 lb (100.245 kg)    Physical Exam  Constitutional: He is oriented to person, place, and time. He appears well-developed and well-nourished.  HENT:  Head: Normocephalic and atraumatic.  Right Ear: External ear normal.  Left Ear: External ear normal.  Eyes: Conjunctivae and EOM are normal. Pupils are equal, round, and reactive to light.  Neck: Normal range of motion. Neck supple.  Cardiovascular: Normal rate, regular rhythm, normal heart sounds and intact distal pulses.   Pulmonary/Chest: Effort normal and breath sounds normal.  Abdominal: Soft. Bowel sounds are normal. There is no splenomegaly or hepatomegaly.  Genitourinary: Rectum  normal and penis normal.  Prostate removed  Musculoskeletal: Normal range of motion.  Neurological: He is alert and oriented to person, place, and time. He has normal reflexes.  Skin: No rash noted. No erythema.  Psychiatric: He has a normal mood and affect. His behavior is normal. Judgment and thought content normal.        Assessment & Plan:   Problem List Items Addressed This Visit      Cardiovascular and Mediastinum   Hypertension - Primary    The current medical regimen is effective;  continue present plan and medications.       Relevant Medications   lisinopril (PRINIVIL,ZESTRIL) 10 MG tablet     Respiratory   Allergic rhinitis    The current medical regimen is effective;  continue present plan and medications.       Relevant Medications   fluticasone (FLONASE) 50 MCG/ACT nasal spray     Other   Hyperlipidemia    With concern about word finding will hold simvastatin for several weeks and obs sx. Consider starting other meds       Relevant Medications   lisinopril (PRINIVIL,ZESTRIL) 10 MG tablet    Other Visit Diagnoses    PE (physical exam), annual        Relevant Orders    CBC with Differential/Platelet    Urinalysis, Routine w reflex microscopic (not at [facility])    TSH    PSA    Lipid panel  Comprehensive metabolic panel        Follow up plan: Return in about 7 months (around 12/09/2015) for Physical Exam welcome to medicare.

## 2015-05-09 NOTE — Assessment & Plan Note (Signed)
With concern about word finding will hold simvastatin for several weeks and obs sx. Consider starting other meds

## 2015-05-10 LAB — COMPREHENSIVE METABOLIC PANEL
A/G RATIO: 2.2 (ref 1.1–2.5)
ALK PHOS: 68 IU/L (ref 39–117)
ALT: 19 IU/L (ref 0–44)
AST: 22 IU/L (ref 0–40)
Albumin: 4.3 g/dL (ref 3.6–4.8)
BUN/Creatinine Ratio: 21 (ref 10–22)
BUN: 18 mg/dL (ref 8–27)
Bilirubin Total: 0.6 mg/dL (ref 0.0–1.2)
CO2: 28 mmol/L (ref 18–29)
CREATININE: 0.85 mg/dL (ref 0.76–1.27)
Calcium: 9.5 mg/dL (ref 8.6–10.2)
Chloride: 102 mmol/L (ref 97–108)
GFR calc Af Amer: 106 mL/min/{1.73_m2} (ref >59)
GFR, EST NON AFRICAN AMERICAN: 92 mL/min/{1.73_m2} (ref >59)
GLOBULIN, TOTAL: 2 g/dL (ref 1.5–4.5)
Glucose: 99 mg/dL (ref 65–99)
Potassium: 4.9 mmol/L (ref 3.5–5.2)
SODIUM: 142 mmol/L (ref 134–144)
Total Protein: 6.3 g/dL (ref 6.0–8.5)

## 2015-05-10 LAB — LIPID PANEL
CHOL/HDL RATIO: 3.3 ratio (ref 0.0–5.0)
Cholesterol, Total: 186 mg/dL (ref 100–199)
HDL: 56 mg/dL (ref >39)
LDL Calculated: 103 mg/dL — ABNORMAL HIGH (ref 0–99)
Triglycerides: 137 mg/dL (ref 0–149)
VLDL Cholesterol Cal: 27 mg/dL (ref 5–40)

## 2015-05-10 LAB — PSA: Prostate Specific Ag, Serum: 0.2 ng/mL (ref 0.0–4.0)

## 2015-05-10 LAB — CBC WITH DIFFERENTIAL/PLATELET
BASOS: 1 %
Basophils Absolute: 0 10*3/uL (ref 0.0–0.2)
EOS (ABSOLUTE): 0.2 10*3/uL (ref 0.0–0.4)
EOS: 5 %
HEMOGLOBIN: 14.6 g/dL (ref 12.6–17.7)
Hematocrit: 43.1 % (ref 37.5–51.0)
IMMATURE GRANULOCYTES: 0 %
Immature Grans (Abs): 0 10*3/uL (ref 0.0–0.1)
LYMPHS ABS: 1.2 10*3/uL (ref 0.7–3.1)
Lymphs: 27 %
MCH: 31.1 pg (ref 26.6–33.0)
MCHC: 33.9 g/dL (ref 31.5–35.7)
MCV: 92 fL (ref 79–97)
MONOS ABS: 0.4 10*3/uL (ref 0.1–0.9)
Monocytes: 9 %
NEUTROS ABS: 2.6 10*3/uL (ref 1.4–7.0)
NEUTROS PCT: 58 %
Platelets: 245 10*3/uL (ref 150–379)
RBC: 4.69 x10E6/uL (ref 4.14–5.80)
RDW: 14.4 % (ref 12.3–15.4)
WBC: 4.4 10*3/uL (ref 3.4–10.8)

## 2015-05-10 LAB — TSH: TSH: 1.59 u[IU]/mL (ref 0.450–4.500)

## 2015-07-17 ENCOUNTER — Other Ambulatory Visit: Payer: Self-pay | Admitting: Family Medicine

## 2015-10-15 DIAGNOSIS — L905 Scar conditions and fibrosis of skin: Secondary | ICD-10-CM | POA: Diagnosis not present

## 2015-10-15 DIAGNOSIS — C44519 Basal cell carcinoma of skin of other part of trunk: Secondary | ICD-10-CM | POA: Diagnosis not present

## 2015-10-21 ENCOUNTER — Other Ambulatory Visit: Payer: Self-pay

## 2015-10-21 DIAGNOSIS — I10 Essential (primary) hypertension: Secondary | ICD-10-CM

## 2015-10-21 MED ORDER — MELOXICAM 15 MG PO TABS: 15.0000 mg | ORAL_TABLET | Freq: Every day | ORAL | Status: DC

## 2015-10-21 NOTE — Telephone Encounter (Signed)
LAST VISIT: 05/09/2015 NEXT APPT: 11/28/2015  Request for Meloxicam 15mg 

## 2015-10-23 ENCOUNTER — Telehealth: Payer: Self-pay | Admitting: Family Medicine

## 2015-10-23 DIAGNOSIS — I10 Essential (primary) hypertension: Secondary | ICD-10-CM

## 2015-10-23 NOTE — Telephone Encounter (Signed)
Pt needs all RX's switched to 90 day supplies and sent via Mail Order. Please call [facility] ASAP.

## 2015-10-23 NOTE — Telephone Encounter (Signed)
Which mail order?

## 2015-10-23 NOTE — Telephone Encounter (Signed)
[facility]

## 2015-10-28 MED ORDER — MELOXICAM 15 MG PO TABS: 15.0000 mg | ORAL_TABLET | Freq: Every day | ORAL | Status: DC

## 2015-10-28 MED ORDER — LISINOPRIL 10 MG PO TABS: 10.0000 mg | ORAL_TABLET | Freq: Every day | ORAL | Status: DC

## 2015-10-28 MED ORDER — SIMVASTATIN 20 MG PO TABS: 20.0000 mg | ORAL_TABLET | Freq: Every day | ORAL | Status: DC

## 2015-11-06 ENCOUNTER — Other Ambulatory Visit: Payer: Self-pay | Admitting: Family Medicine

## 2015-11-28 ENCOUNTER — Encounter: Payer: Self-pay | Admitting: Family Medicine

## 2015-11-28 ENCOUNTER — Ambulatory Visit (INDEPENDENT_AMBULATORY_CARE_PROVIDER_SITE_OTHER): Payer: Medicare Other | Admitting: Family Medicine

## 2015-11-28 VITALS — BP 138/74 | HR 81 | Temp 98.4°F | Ht 72.0 in | Wt 230.0 lb

## 2015-11-28 DIAGNOSIS — J302 Other seasonal allergic rhinitis: Secondary | ICD-10-CM

## 2015-11-28 DIAGNOSIS — C61 Malignant neoplasm of prostate: Secondary | ICD-10-CM | POA: Diagnosis not present

## 2015-11-28 DIAGNOSIS — Z0000 Encounter for general adult medical examination without abnormal findings: Secondary | ICD-10-CM

## 2015-11-28 DIAGNOSIS — I10 Essential (primary) hypertension: Secondary | ICD-10-CM

## 2015-11-28 DIAGNOSIS — Z23 Encounter for immunization: Secondary | ICD-10-CM | POA: Diagnosis not present

## 2015-11-28 DIAGNOSIS — E785 Hyperlipidemia, unspecified: Secondary | ICD-10-CM | POA: Diagnosis not present

## 2015-11-28 DIAGNOSIS — S61402A Unspecified open wound of left hand, initial encounter: Secondary | ICD-10-CM | POA: Diagnosis not present

## 2015-11-28 MED ORDER — FLUTICASONE PROPIONATE 50 MCG/ACT NA SUSP: 2.0000 | Freq: Every day | NASAL | Status: DC

## 2015-11-28 MED ORDER — LISINOPRIL 10 MG PO TABS: 10.0000 mg | ORAL_TABLET | Freq: Every day | ORAL | Status: DC

## 2015-11-28 MED ORDER — SIMVASTATIN 20 MG PO TABS: 20.0000 mg | ORAL_TABLET | Freq: Every day | ORAL | Status: DC

## 2015-11-28 NOTE — Assessment & Plan Note (Signed)
The current medical regimen is effective;  continue present plan and medications.  

## 2015-11-28 NOTE — Progress Notes (Signed)
BP 138/74 mmHg  Pulse 81  Temp(Src) 98.4 F (36.9 C)  Ht 6' (1.829 m)  Wt 230 lb (104.327 kg)  BMI 31.19 kg/m2  SpO2 96%   Subjective:    Patient ID: Christopher Huang, male    DOB: 22-Feb-1951, 65 y.o.   MRN: [id number]  HPI: Christopher Huang is a 65 y.o. male  Chief Complaint  Patient presents with  . Welcome to [employer]  . Td due- we have vaccine   Patient with wound on his left hand thumb happened yesterday healing well no redness will need tetanus shot Patient's blood pressure cholesterol taking medications with no side effects and faithfully Because of means doing okay for arthritis which is primarily in his left and right knees. Patient's taking meloxicam has been sprinkling on some Advil and Aleve which were discussed and patient will do any more  Relevant past medical, surgical, family and social history reviewed and updated as indicated. Interim medical history since our last visit reviewed. Allergies and medications reviewed and updated.  Review of Systems  Constitutional: Negative.   HENT: Negative.   Eyes: Negative.   Respiratory: Negative.   Cardiovascular: Negative.   Gastrointestinal: Negative.   Endocrine: Negative.   Genitourinary: Negative.   Musculoskeletal: Negative.   Skin: Negative.   Allergic/Immunologic: Negative.   Neurological: Negative.   Hematological: Negative.   Psychiatric/Behavioral: Negative.     Per HPI unless specifically indicated above     Objective:    BP 138/74 mmHg  Pulse 81  Temp(Src) 98.4 F (36.9 C)  Ht 6' (1.829 m)  Wt 230 lb (104.327 kg)  BMI 31.19 kg/m2  SpO2 96%  Wt Readings from Last 3 Encounters:  11/28/15 230 lb (104.327 kg)  05/09/15 212 lb (96.163 kg)  11/06/14 221 lb (100.245 kg)    Physical Exam  Constitutional: He is oriented to person, place, and time. He appears well-developed and well-nourished.  HENT:  Head: Normocephalic and atraumatic.  Right Ear: External ear normal.  Left Ear:  External ear normal.  Eyes: Conjunctivae and EOM are normal. Pupils are equal, round, and reactive to light.  Neck: Normal range of motion. Neck supple.  Cardiovascular: Normal rate, regular rhythm, normal heart sounds and intact distal pulses.   Pulmonary/Chest: Effort normal and breath sounds normal.  Abdominal: Soft. Bowel sounds are normal. There is no splenomegaly or hepatomegaly.  Genitourinary: Rectum normal and penis normal.  Prostate removed  Musculoskeletal: Normal range of motion.  Neurological: He is alert and oriented to person, place, and time. He has normal reflexes.  Skin: No rash noted. No erythema.  Psychiatric: He has a normal mood and affect. His behavior is normal. Judgment and thought content normal.    Results for orders placed or performed in visit on 05/09/15  Microscopic Examination  Result Value Ref Range   WBC, UA 0-5 0 -  5 /hpf   RBC, UA 0-2 0 -  2 /hpf   Epithelial Cells (non renal) 0-10 0 - 10 /hpf   Mucus, UA Present Not Estab.   Bacteria, UA Few None seen/Few  CBC with Differential/Platelet  Result Value Ref Range   WBC 4.4 3.4 - 10.8 x10E3/uL   RBC 4.69 4.14 - 5.80 x10E6/uL   Hemoglobin 14.6 12.6 - 17.7 g/dL   Hematocrit 43.1 37.5 - 51.0 %   MCV 92 79 - 97 fL   MCH 31.1 26.6 - 33.0 pg   MCHC 33.9 31.5 - 35.7 g/dL   RDW  14.4 12.3 - 15.4 %   Platelets 245 150 - 379 x10E3/uL   Neutrophils 58 %   Lymphs 27 %   Monocytes 9 %   Eos 5 %   Basos 1 %   Neutrophils Absolute 2.6 1.4 - 7.0 x10E3/uL   Lymphocytes Absolute 1.2 0.7 - 3.1 x10E3/uL   Monocytes Absolute 0.4 0.1 - 0.9 x10E3/uL   EOS (ABSOLUTE) 0.2 0.0 - 0.4 x10E3/uL   Basophils Absolute 0.0 0.0 - 0.2 x10E3/uL   Immature Granulocytes 0 %   Immature Grans (Abs) 0.0 0.0 - 0.1 x10E3/uL  Urinalysis, Routine w reflex microscopic (not at [facility])  Result Value Ref Range   Specific Gravity, UA 1.025 1.005 - 1.030   pH, UA 5.5 5.0 - 7.5   Color, UA Yellow Yellow   Appearance Ur Clear Clear    Leukocytes, UA Negative Negative   Protein, UA Negative Negative/Trace   Glucose, UA Negative Negative   Ketones, UA Negative Negative   RBC, UA Trace (A) Negative   Bilirubin, UA Negative Negative   Urobilinogen, Ur 0.2 0.2 - 1.0 mg/dL   Nitrite, UA Negative Negative   Microscopic Examination See below:   TSH  Result Value Ref Range   TSH 1.590 0.450 - 4.500 uIU/mL  PSA  Result Value Ref Range   Prostate Specific Ag, Serum 0.2 0.0 - 4.0 ng/mL  Lipid panel  Result Value Ref Range   Cholesterol, Total 186 100 - 199 mg/dL   Triglycerides 137 0 - 149 mg/dL   HDL 56 >39 mg/dL   VLDL Cholesterol Cal 27 5 - 40 mg/dL   LDL Calculated 103 (H) 0 - 99 mg/dL   Chol/HDL Ratio 3.3 0.0 - 5.0 ratio units  Comprehensive metabolic panel  Result Value Ref Range   Glucose 99 65 - 99 mg/dL   BUN 18 8 - 27 mg/dL   Creatinine, Ser 0.85 0.76 - 1.27 mg/dL   GFR calc non Af Amer 92 >59 mL/min/1.73   GFR calc Af Amer 106 >59 mL/min/1.73   BUN/Creatinine Ratio 21 10 - 22   Sodium 142 134 - 144 mmol/L   Potassium 4.9 3.5 - 5.2 mmol/L   Chloride 102 97 - 108 mmol/L   CO2 28 18 - 29 mmol/L   Calcium 9.5 8.6 - 10.2 mg/dL   Total Protein 6.3 6.0 - 8.5 g/dL   Albumin 4.3 3.6 - 4.8 g/dL   Globulin, Total 2.0 1.5 - 4.5 g/dL   Albumin/Globulin Ratio 2.2 1.1 - 2.5   Bilirubin Total 0.6 0.0 - 1.2 mg/dL   Alkaline Phosphatase 68 39 - 117 IU/L   AST 22 0 - 40 IU/L   ALT 19 0 - 44 IU/L      Assessment & Plan:   Problem List Items Addressed This Visit      Cardiovascular and Mediastinum   Hypertension    The current medical regimen is effective;  continue present plan and medications.       Relevant Medications   aspirin EC 81 MG tablet   simvastatin (ZOCOR) 20 MG tablet   lisinopril (PRINIVIL,ZESTRIL) 10 MG tablet     Respiratory   Allergic rhinitis   Relevant Medications   fluticasone (FLONASE) 50 MCG/ACT nasal spray     Other   Hyperlipidemia    The current medical regimen is effective;   continue present plan and medications.       Relevant Medications   aspirin EC 81 MG tablet   simvastatin (ZOCOR)  20 MG tablet   lisinopril (PRINIVIL,ZESTRIL) 10 MG tablet    Other Visit Diagnoses    Welcome to Medicare preventive visit    -  Primary    Relevant Orders    US Aorta Initial Medicare Screen    EKG 12-Lead (Completed)    Open wound of left hand, initial encounter        Wound care given and tetanus shot    Relevant Orders    Td vaccine greater than or equal to 7yo preservative free IM (Completed)      welcome to Medicare Metrix met discussed living will etc.  Follow up plan: Return in about 6 months (around 05/27/2016), or if symptoms worsen or fail to improve, for BMP lipids, alt, ast.

## 2015-11-29 ENCOUNTER — Telehealth: Payer: Self-pay | Admitting: Family Medicine

## 2015-11-29 DIAGNOSIS — D72829 Elevated white blood cell count, unspecified: Secondary | ICD-10-CM

## 2015-11-29 LAB — LIPID PANEL
Chol/HDL Ratio: 3.5 ratio units (ref 0.0–5.0)
Cholesterol, Total: 229 mg/dL — ABNORMAL HIGH (ref 100–199)
HDL: 66 mg/dL (ref >39)
LDL Calculated: 145 mg/dL — ABNORMAL HIGH (ref 0–99)
Triglycerides: 92 mg/dL (ref 0–149)
VLDL Cholesterol Cal: 18 mg/dL (ref 5–40)

## 2015-11-29 LAB — CBC WITH DIFFERENTIAL/PLATELET
BASOS ABS: 0 10*3/uL (ref 0.0–0.2)
Basos: 0 %
EOS (ABSOLUTE): 0 10*3/uL (ref 0.0–0.4)
Eos: 0 %
Hematocrit: 44 % (ref 37.5–51.0)
Hemoglobin: 15 g/dL (ref 12.6–17.7)
IMMATURE GRANS (ABS): 0 10*3/uL (ref 0.0–0.1)
Immature Granulocytes: 0 %
LYMPHS: 9 %
Lymphocytes Absolute: 1.4 10*3/uL (ref 0.7–3.1)
MCH: 30.3 pg (ref 26.6–33.0)
MCHC: 34.1 g/dL (ref 31.5–35.7)
MCV: 89 fL (ref 79–97)
MONOS ABS: 0.7 10*3/uL (ref 0.1–0.9)
Monocytes: 5 %
NEUTROS ABS: 13 10*3/uL — AB (ref 1.4–7.0)
NEUTROS PCT: 86 %
PLATELETS: 288 10*3/uL (ref 150–379)
RBC: 4.95 x10E6/uL (ref 4.14–5.80)
RDW: 13.8 % (ref 12.3–15.4)
WBC: 15.2 10*3/uL — ABNORMAL HIGH (ref 3.4–10.8)

## 2015-11-29 LAB — URINALYSIS, ROUTINE W REFLEX MICROSCOPIC
Bilirubin, UA: NEGATIVE
GLUCOSE, UA: NEGATIVE
KETONES UA: NEGATIVE
LEUKOCYTES UA: NEGATIVE
Nitrite, UA: NEGATIVE
PROTEIN UA: NEGATIVE
RBC, UA: NEGATIVE
Specific Gravity, UA: 1.01 (ref 1.005–1.030)
UUROB: 0.2 mg/dL (ref 0.2–1.0)
pH, UA: 7 (ref 5.0–7.5)

## 2015-11-29 LAB — TSH: TSH: 1 u[IU]/mL (ref 0.450–4.500)

## 2015-11-29 LAB — COMPREHENSIVE METABOLIC PANEL
A/G RATIO: 1.9 (ref 1.1–2.5)
ALT: 26 IU/L (ref 0–44)
AST: 23 IU/L (ref 0–40)
Albumin: 4.4 g/dL (ref 3.6–4.8)
Alkaline Phosphatase: 83 IU/L (ref 39–117)
BILIRUBIN TOTAL: 0.3 mg/dL (ref 0.0–1.2)
BUN/Creatinine Ratio: 22 (ref 10–22)
BUN: 17 mg/dL (ref 8–27)
CHLORIDE: 100 mmol/L (ref 96–106)
CO2: 26 mmol/L (ref 18–29)
Calcium: 9.6 mg/dL (ref 8.6–10.2)
Creatinine, Ser: 0.78 mg/dL (ref 0.76–1.27)
GFR calc non Af Amer: 95 mL/min/{1.73_m2} (ref >59)
GFR, EST AFRICAN AMERICAN: 110 mL/min/{1.73_m2} (ref >59)
GLOBULIN, TOTAL: 2.3 g/dL (ref 1.5–4.5)
Glucose: 115 mg/dL — ABNORMAL HIGH (ref 65–99)
POTASSIUM: 4.8 mmol/L (ref 3.5–5.2)
SODIUM: 143 mmol/L (ref 134–144)
TOTAL PROTEIN: 6.7 g/dL (ref 6.0–8.5)

## 2015-11-29 LAB — PSA: PROSTATE SPECIFIC AG, SERUM: 0.2 ng/mL (ref 0.0–4.0)

## 2015-11-29 NOTE — Telephone Encounter (Signed)
Phone call Discussed patient's labs are normal except for elevated white count patient's had little sinus congestion drainage and cough which seems to be getting better Discussed for recheck CBC 1 month to confirm white count returning to normal.

## 2015-11-29 NOTE — Telephone Encounter (Signed)
-----   Message from Wynn Maudlin, [place] sent at 11/29/2015 11:16 AM EST ----- labs

## 2015-12-02 ENCOUNTER — Encounter: Payer: Private Health Insurance - Indemnity | Admitting: Family Medicine

## 2015-12-12 ENCOUNTER — Other Ambulatory Visit: Payer: Medicare Other

## 2015-12-12 DIAGNOSIS — D72829 Elevated white blood cell count, unspecified: Secondary | ICD-10-CM | POA: Diagnosis not present

## 2015-12-13 LAB — CBC WITH DIFFERENTIAL/PLATELET
BASOS ABS: 0 10*3/uL (ref 0.0–0.2)
Basos: 1 %
EOS (ABSOLUTE): 0.2 10*3/uL (ref 0.0–0.4)
Eos: 4 %
HEMOGLOBIN: 15.2 g/dL (ref 12.6–17.7)
Hematocrit: 44.1 % (ref 37.5–51.0)
IMMATURE GRANULOCYTES: 0 %
Immature Grans (Abs): 0 10*3/uL (ref 0.0–0.1)
LYMPHS: 31 %
Lymphocytes Absolute: 1.8 10*3/uL (ref 0.7–3.1)
MCH: 30.8 pg (ref 26.6–33.0)
MCHC: 34.5 g/dL (ref 31.5–35.7)
MCV: 89 fL (ref 79–97)
MONOCYTES: 6 %
Monocytes Absolute: 0.4 10*3/uL (ref 0.1–0.9)
NEUTROS ABS: 3.4 10*3/uL (ref 1.4–7.0)
NEUTROS PCT: 58 %
PLATELETS: 214 10*3/uL (ref 150–379)
RBC: 4.94 x10E6/uL (ref 4.14–5.80)
RDW: 13.8 % (ref 12.3–15.4)
WBC: 5.9 10*3/uL (ref 3.4–10.8)

## 2015-12-31 DIAGNOSIS — Z85828 Personal history of other malignant neoplasm of skin: Secondary | ICD-10-CM | POA: Diagnosis not present

## 2015-12-31 DIAGNOSIS — X32XXXA Exposure to sunlight, initial encounter: Secondary | ICD-10-CM | POA: Diagnosis not present

## 2015-12-31 DIAGNOSIS — L820 Inflamed seborrheic keratosis: Secondary | ICD-10-CM | POA: Diagnosis not present

## 2015-12-31 DIAGNOSIS — L821 Other seborrheic keratosis: Secondary | ICD-10-CM | POA: Diagnosis not present

## 2015-12-31 DIAGNOSIS — L570 Actinic keratosis: Secondary | ICD-10-CM | POA: Diagnosis not present

## 2015-12-31 DIAGNOSIS — S70922A Unspecified superficial injury of left thigh, initial encounter: Secondary | ICD-10-CM | POA: Diagnosis not present

## 2015-12-31 DIAGNOSIS — L538 Other specified erythematous conditions: Secondary | ICD-10-CM | POA: Diagnosis not present

## 2016-01-02 ENCOUNTER — Other Ambulatory Visit: Payer: Medicare Other

## 2016-01-17 ENCOUNTER — Ambulatory Visit (INDEPENDENT_AMBULATORY_CARE_PROVIDER_SITE_OTHER): Payer: Medicare Other | Admitting: Family Medicine

## 2016-01-17 VITALS — BP 142/76 | HR 78 | Temp 98.5°F | Resp 16 | Ht 74.0 in | Wt 236.0 lb

## 2016-01-17 DIAGNOSIS — J320 Chronic maxillary sinusitis: Secondary | ICD-10-CM | POA: Diagnosis not present

## 2016-01-17 DIAGNOSIS — I10 Essential (primary) hypertension: Secondary | ICD-10-CM | POA: Diagnosis not present

## 2016-01-17 DIAGNOSIS — J302 Other seasonal allergic rhinitis: Secondary | ICD-10-CM | POA: Diagnosis not present

## 2016-01-17 MED ORDER — SALINE SPRAY 0.65 % NA SOLN: 2.0000 | NASAL | Status: DC | PRN

## 2016-01-17 MED ORDER — LEVOFLOXACIN 500 MG PO TABS: 500.0000 mg | ORAL_TABLET | Freq: Every day | ORAL | Status: DC

## 2016-01-17 MED ORDER — OXYMETAZOLINE HCL 0.05 % NA SOLN: 1.0000 | Freq: Two times a day (BID) | NASAL | Status: DC

## 2016-01-17 MED ORDER — CETIRIZINE HCL 10 MG PO TABS: 10.0000 mg | ORAL_TABLET | Freq: Every day | ORAL | Status: DC

## 2016-01-17 MED ORDER — DM-GUAIFENESIN ER 30-600 MG PO TB12: 1.0000 | ORAL_TABLET | Freq: Two times a day (BID) | ORAL | Status: DC

## 2016-01-17 NOTE — Patient Instructions (Addendum)
Your symptoms are consistent with a viral upper respiratory infection. At this time there is no need for antibiotics.  If your symptoms persist for > 10 days or get better and than worsen please let me know. You may have a secondary bacterial infection.  Try saline nasal spray to help with your sinuses. Take your flonase daily. Try a zyrtec over the counter. Take mucinex DM to help with drainage. Avoid frequent use of over the counter decongestants such as Tylenol Sinus due to your blood pressure.   IF you develop a fever, facial swelling over the weekend, please fill the Levaquin.

## 2016-01-17 NOTE — Progress Notes (Signed)
Subjective:    Patient ID: Christopher Huang, male    DOB: 10-04-1951, 65 y.o.   MRN: [id number]  HPI: Christopher Huang is a 65 y.o. male presenting on 01/17/2016 for Sinusitis   HPI  Pt presents for possible sinusitis. Symptoms started yesterday afternoon after working outside. Has sinus pressure behind bilateral eyes, around nose, and forehead, worse on the left. Has PND. No itchy/watery eyes. Rhinorrhea clear and thick. Yellow productive sputum with cough. Bilateral ear pressure, worse on the left. No trouble breathing, chest tightness. No fever/chills. Kept up all night with runny nose. Only sick contact was his father-in-law, who has had sinus symptoms for 2 weeks. Took mucinex last night, which helped some. Pt has a history of chronic sinusitis, which has required levaquin in the past.   Past Medical History  Diagnosis Date  . Allergic rhinitis   . Hyperlipidemia   . Hypertension   . Personal history of kidney stones   . Cancer of prostate ([place]) 4/08    s/p surgery    Current Outpatient Prescriptions on File Prior to Visit  Medication Sig  . aspirin EC 81 MG tablet Take 81 mg by mouth daily.  Marland Kitchen co-enzyme Q-10 30 MG capsule Take 30 mg by mouth 3 (three) times daily.  . fluticasone (FLONASE) 50 MCG/ACT nasal spray Place 2 sprays into both nostrils daily.  . Glucosamine-Chondroit-Vit C-Mn (GLUCOSAMINE CHONDR 1500 COMPLX) CAPS Take by mouth.  Marland Kitchen lisinopril (PRINIVIL,ZESTRIL) 10 MG tablet Take 1 tablet (10 mg total) by mouth daily.  . meloxicam (MOBIC) 15 MG tablet Take 1 tablet (15 mg total) by mouth daily.  . Multiple Vitamin (MULTIVITAMIN WITH MINERALS) TABS tablet Take 1 tablet by mouth daily.  . simvastatin (ZOCOR) 20 MG tablet Take 1 tablet (20 mg total) by mouth at bedtime.   No current facility-administered medications on file prior to visit.    Review of Systems  Constitutional: Negative for fever, chills, activity change, appetite change and fatigue.  HENT: Positive  for congestion, ear pain, postnasal drip, rhinorrhea and sinus pressure. Negative for dental problem, facial swelling, hearing loss, sore throat and trouble swallowing.   Eyes: Negative for pain, discharge, itching and visual disturbance.  Respiratory: Positive for cough. Negative for chest tightness and shortness of breath.   Cardiovascular: Negative for chest pain.  Gastrointestinal: Negative for nausea, vomiting and abdominal pain.  Genitourinary: Negative for dysuria and difficulty urinating.  Musculoskeletal: Negative for back pain, joint swelling, arthralgias, neck pain and neck stiffness.  Skin: Negative for color change.  Neurological: Positive for headaches. Negative for dizziness, weakness and light-headedness.  Hematological: Negative for adenopathy.  Psychiatric/Behavioral: Negative for behavioral problems and agitation.   Per HPI unless specifically indicated above     Objective:    BP 142/76 mmHg  Pulse 78  Temp(Src) 98.5 F (36.9 C) (Oral)  Resp 16  Ht 6\' 2"  (1.88 m)  Wt 236 lb (107.049 kg)  BMI 30.29 kg/m2  SpO2 96%  Wt Readings from Last 3 Encounters:  01/17/16 236 lb (107.049 kg)  11/28/15 230 lb (104.327 kg)  05/09/15 212 lb (96.163 kg)    Physical Exam  Constitutional: He is oriented to person, place, and time. He appears well-developed.  HENT:  Head: Normocephalic and atraumatic.  Right Ear: There is tenderness. Tympanic membrane is not erythematous and not bulging.  Left Ear: There is tenderness. Tympanic membrane is retracted. Tympanic membrane is not erythematous and not bulging. A middle ear effusion is present.  Nose: Mucosal edema and rhinorrhea present. No nose lacerations. Right sinus exhibits no maxillary sinus tenderness and no frontal sinus tenderness. Left sinus exhibits maxillary sinus tenderness and frontal sinus tenderness.  Mouth/Throat: Uvula is midline, oropharynx is clear and moist and mucous membranes are normal. No oropharyngeal exudate,  posterior oropharyngeal edema or posterior oropharyngeal erythema.  White spot on frenulum that dentist is following  Eyes: Conjunctivae are normal.  Neck: Normal range of motion.  Cardiovascular: Normal rate, regular rhythm and normal heart sounds.  Exam reveals no gallop and no friction rub.   No murmur heard. Pulmonary/Chest: Effort normal and breath sounds normal. No respiratory distress.  Musculoskeletal: Normal range of motion.  Lymphadenopathy:    He has no cervical adenopathy.  Neurological: He is alert and oriented to person, place, and time.  Skin: Skin is warm and dry.   Results for orders placed or performed in visit on 12/12/15  CBC with Differential/Platelet  Result Value Ref Range   WBC 5.9 3.4 - 10.8 x10E3/uL   RBC 4.94 4.14 - 5.80 x10E6/uL   Hemoglobin 15.2 12.6 - 17.7 g/dL   Hematocrit 44.1 37.5 - 51.0 %   MCV 89 79 - 97 fL   MCH 30.8 26.6 - 33.0 pg   MCHC 34.5 31.5 - 35.7 g/dL   RDW 13.8 12.3 - 15.4 %   Platelets 214 150 - 379 x10E3/uL   Neutrophils 58 %   Lymphs 31 %   Monocytes 6 %   Eos 4 %   Basos 1 %   Neutrophils Absolute 3.4 1.4 - 7.0 x10E3/uL   Lymphocytes Absolute 1.8 0.7 - 3.1 x10E3/uL   Monocytes Absolute 0.4 0.1 - 0.9 x10E3/uL   EOS (ABSOLUTE) 0.2 0.0 - 0.4 x10E3/uL   Basophils Absolute 0.0 0.0 - 0.2 x10E3/uL   Immature Granulocytes 0 %   Immature Grans (Abs) 0.0 0.0 - 0.1 x10E3/uL      Assessment & Plan:   Problem List Items Addressed This Visit      Cardiovascular and Mediastinum   Hypertension    BP mildly elevated today. Pt encouraged to check at home. Avoid OTC oral decongestants due to hypertension.  Follow-up with PCP.         Respiratory   Allergic rhinitis - Primary    Sinus symptoms likely allergy mediated. Encouraged daily use of flonase and OTC claritin and zyrtec to help control symptoms.       Relevant Medications   sodium chloride (OCEAN) 0.65 % SOLN nasal spray   cetirizine (ZYRTEC) 10 MG tablet   Chronic  maxillary sinusitis    Treat supportively at home due to short duration of symptoms. Afrin for decongestant, flonase, saline rinses, tylenol and ibuprofen. Paper prescription for levaquin given for facial swelling and fever over the weekend. Antibiotic indications reviewed.       Relevant Medications   sodium chloride (OCEAN) 0.65 % SOLN nasal spray   dextromethorphan-guaiFENesin (MUCINEX DM) 30-600 MG 12hr tablet   cetirizine (ZYRTEC) 10 MG tablet   oxymetazoline (AFRIN NASAL SPRAY) 0.05 % nasal spray   levofloxacin (LEVAQUIN) 500 MG tablet      Meds ordered this encounter  Medications  . sodium chloride (OCEAN) 0.65 % SOLN nasal spray    Sig: Place 2 sprays into both nostrils as needed for congestion.    Refill:  0    Order Specific Question:  Supervising Provider    Answer:  Arlis Porta [id number]  . dextromethorphan-guaiFENesin ([place] DM) 30-600 MG  12hr tablet    Sig: Take 1 tablet by mouth 2 (two) times daily.    Dispense:  20 tablet    Refill:  0    Order Specific Question:  Supervising Provider    Answer:  Arlis Porta [phone]  . cetirizine (ZYRTEC) 10 MG tablet    Sig: Take 1 tablet (10 mg total) by mouth daily.    Dispense:  30 tablet    Refill:  11    Order Specific Question:  Supervising Provider    Answer:  Arlis Porta [phone]  . oxymetazoline (AFRIN NASAL SPRAY) 0.05 % nasal spray    Sig: Place 1 spray into both nostrils 2 (two) times daily. Three days and three days only.    Dispense:  30 mL    Refill:  0    Order Specific Question:  Supervising Provider    Answer:  Arlis Porta [id number]  . levofloxacin (LEVAQUIN) 500 MG tablet    Sig: Take 1 tablet (500 mg total) by mouth daily.    Dispense:  7 tablet    Refill:  0    Order Specific Question:  Supervising Provider    Answer:  Arlis Porta [id number]      Follow up plan: Return if symptoms worsen or fail to improve, for Follow up with PCP if not improving. Marland Kitchen

## 2016-01-17 NOTE — Assessment & Plan Note (Signed)
BP mildly elevated today. Pt encouraged to check at home. Avoid OTC oral decongestants due to hypertension.  Follow-up with PCP.

## 2016-01-17 NOTE — Assessment & Plan Note (Signed)
Treat supportively at home due to short duration of symptoms. Afrin for decongestant, flonase, saline rinses, tylenol and ibuprofen. Paper prescription for levaquin given for facial swelling and fever over the weekend. Antibiotic indications reviewed.

## 2016-01-17 NOTE — Assessment & Plan Note (Signed)
Sinus symptoms likely allergy mediated. Encouraged daily use of flonase and OTC claritin and zyrtec to help control symptoms.

## 2016-04-24 ENCOUNTER — Other Ambulatory Visit: Payer: Self-pay | Admitting: Family Medicine

## 2016-06-29 DIAGNOSIS — L218 Other seborrheic dermatitis: Secondary | ICD-10-CM | POA: Diagnosis not present

## 2016-06-29 DIAGNOSIS — L821 Other seborrheic keratosis: Secondary | ICD-10-CM | POA: Diagnosis not present

## 2016-06-29 DIAGNOSIS — Z85828 Personal history of other malignant neoplasm of skin: Secondary | ICD-10-CM | POA: Diagnosis not present

## 2016-06-29 DIAGNOSIS — Z08 Encounter for follow-up examination after completed treatment for malignant neoplasm: Secondary | ICD-10-CM | POA: Diagnosis not present

## 2016-07-07 ENCOUNTER — Ambulatory Visit (INDEPENDENT_AMBULATORY_CARE_PROVIDER_SITE_OTHER): Payer: Medicare Other | Admitting: Family Medicine

## 2016-07-07 ENCOUNTER — Encounter: Payer: Self-pay | Admitting: Family Medicine

## 2016-07-07 VITALS — BP 124/71 | HR 82 | Temp 98.0°F | Ht 75.0 in | Wt 231.0 lb

## 2016-07-07 DIAGNOSIS — E785 Hyperlipidemia, unspecified: Secondary | ICD-10-CM | POA: Diagnosis not present

## 2016-07-07 DIAGNOSIS — I10 Essential (primary) hypertension: Secondary | ICD-10-CM | POA: Diagnosis not present

## 2016-07-07 DIAGNOSIS — Z23 Encounter for immunization: Secondary | ICD-10-CM

## 2016-07-07 LAB — LP+ALT+AST PICCOLO, WAIVED
ALT (SGPT) PICCOLO, WAIVED: 23 U/L (ref 10–47)
AST (SGOT) PICCOLO, WAIVED: 28 U/L (ref 11–38)
CHOL/HDL RATIO PICCOLO,WAIVE: 4 mg/dL
CHOLESTEROL PICCOLO, WAIVED: 206 mg/dL — AB (ref <200)
HDL Chol Piccolo, Waived: 52 mg/dL — ABNORMAL LOW (ref >59)
LDL Chol Calc Piccolo Waived: 106 mg/dL — ABNORMAL HIGH (ref <100)
TRIGLYCERIDES PICCOLO,WAIVED: 241 mg/dL — AB (ref <150)
VLDL Chol Calc Piccolo,Waive: 48 mg/dL — ABNORMAL HIGH (ref <30)

## 2016-07-07 MED ORDER — MELOXICAM 15 MG PO TABS: 15.0000 mg | ORAL_TABLET | Freq: Every day | ORAL | 1 refills | Status: DC

## 2016-07-07 MED ORDER — LISINOPRIL 10 MG PO TABS: 10.0000 mg | ORAL_TABLET | Freq: Every day | ORAL | 1 refills | Status: DC

## 2016-07-07 NOTE — Patient Instructions (Addendum)
Pneumococcal Conjugate Vaccine (PCV13)   1. Why get vaccinated?  Vaccination can protect both children and adults from pneumococcal disease.  Pneumococcal disease is caused by bacteria that can spread from person to person through close contact. It can cause ear infections, and it can also lead to more serious infections of the:  · Lungs (pneumonia),  · Blood (bacteremia), and  · Covering of the brain and spinal cord (meningitis).  Pneumococcal pneumonia is most common among adults. Pneumococcal meningitis can cause deafness and brain damage, and it kills about 1 child in 10 who get it.  Anyone can get pneumococcal disease, but children under 2 years of age and adults 65 years and older, people with certain medical conditions, and cigarette smokers are at the highest risk.  Before there was a vaccine, the [place] saw:  · more than 700 cases of meningitis,  · about 13,000 blood infections,  · about 5 million ear infections, and  · about 200 deaths  in children under 5 each year from pneumococcal disease. Since vaccine became available, severe pneumococcal disease in these children has fallen by 88%.  About 18,000 older adults die of pneumococcal disease each year in the [place].  Treatment of pneumococcal infections with penicillin and other drugs is not as effective as it used to be, because some strains of the disease have become resistant to these drugs. This makes prevention of the disease, through vaccination, even more important.  2. PCV13 vaccine  Pneumococcal conjugate vaccine (called PCV13) protects against 13 types of pneumococcal bacteria.  PCV13 is routinely given to children at 2, 4, 6, and 12-15 months of age. It is also recommended for children and adults 2 to 64 years of age with certain health conditions, and for all adults 65 years of age and older. Your doctor can give you details.  3. Some people should not get this vaccine  Anyone who has ever had a life-threatening allergic reaction  to a dose of this vaccine, to an earlier pneumococcal vaccine called PCV7, or to any vaccine containing diphtheria toxoid (for example, DTaP), should not get PCV13.  Anyone with a severe allergy to any component of PCV13 should not get the vaccine. Tell your doctor if the person being vaccinated has any severe allergies.  If the person scheduled for vaccination is not feeling well, your healthcare provider might decide to reschedule the shot on another day.  4. Risks of a vaccine reaction  With any medicine, including vaccines, there is a chance of reactions. These are usually mild and go away on their own, but serious reactions are also possible.  Problems reported following PCV13 varied by age and dose in the series. The most common problems reported among children were:  · About half became drowsy after the shot, had a temporary loss of appetite, or had redness or tenderness where the shot was given.  · About 1 out of 3 had swelling where the shot was given.  · About 1 out of 3 had a mild fever, and about 1 in 20 had a fever over 102.2°F.  · Up to about 8 out of 10 became fussy or irritable.  Adults have reported pain, redness, and swelling where the shot was given; also mild fever, fatigue, headache, chills, or muscle pain.  Young children who get PCV13 along with inactivated flu vaccine at the same time may be at increased risk for seizures caused by fever. Ask your doctor for more information.  Problems that   could happen after any vaccine:  · People sometimes faint after a medical procedure, including vaccination. Sitting or lying down for about 15 minutes can help prevent fainting, and injuries caused by a fall. Tell your doctor if you feel dizzy, or have vision changes or ringing in the ears.  · Some older children and adults get severe pain in the shoulder and have difficulty moving the arm where a shot was given. This happens very rarely.  · Any medication can cause a severe allergic reaction. Such  reactions from a vaccine are very rare, estimated at about 1 in a million doses, and would happen within a few minutes to a few hours after the vaccination.  As with any medicine, there is a very small chance of a vaccine causing a serious injury or death.  The safety of vaccines is always being monitored. For more information, visit: www.cdc.gov/vaccinesafety/  5. What if there is a serious reaction?  What should I look for?  · Look for anything that concerns you, such as signs of a severe allergic reaction, very high fever, or unusual behavior.  Signs of a severe allergic reaction can include hives, swelling of the face and throat, difficulty breathing, a fast heartbeat, dizziness, and weakness-usually within a few minutes to a few hours after the vaccination.  What should I do?  · If you think it is a severe allergic reaction or other emergency that can't wait, call 9-1-1 or get the person to the nearest hospital. Otherwise, call your doctor.  Reactions should be reported to the Vaccine Adverse Event Reporting System (VAERS). Your doctor should file this report, or you can do it yourself through the VAERS web site at www.vaers.hhs.gov, or by calling 1-800-822-7967.  VAERS does not give medical advice.  6. The National Vaccine Injury Compensation Program  The National Vaccine Injury Compensation Program (VICP) is a federal program that was created to compensate people who may have been injured by certain vaccines.  Persons who believe they may have been injured by a vaccine can learn about the program and about filing a claim by calling 1-800-338-2382 or visiting the VICP website at www.hrsa.gov/vaccinecompensation. There is a time limit to file a claim for compensation.  7. How can I learn more?  · Ask your healthcare provider. He or she can give you the vaccine package insert or suggest other sources of information.  · Call your local or state health department.  · Contact the Centers for Disease Control and  Prevention (CDC):    Call 1-800-232-4636 (1-800-CDC-INFO) or    Visit CDC's website at www.cdc.gov/vaccines  Vaccine Information Statement  PCV13 Vaccine (09/27/2014)     This information is not intended to replace advice given to you by your health care provider. Make sure you discuss any questions you have with your health care provider.     Document Released: 09/06/2006 Document Revised: 11/30/2014 Document Reviewed: 10/04/2014  Elsevier Interactive Patient Education ©2016 [employer] Inc.

## 2016-07-07 NOTE — Progress Notes (Signed)
BP 124/71 (BP Location: Left Arm, Patient Position: Sitting, Cuff Size: Normal)   Pulse 82   Temp 98 F (36.7 C)   Ht 6\' 3"  (1.905 m) Comment: with shoes  Wt 231 lb (104.8 kg) Comment: with shoes  SpO2 97%   BMI 28.87 kg/m    Subjective:    Patient ID: Christopher Huang, male    DOB: July 01, 1951, 65 y.o.   MRN: [id number]  HPI: Christopher Huang is a 65 y.o. male  Chief Complaint  Patient presents with  . Hypertension  . Hyperlipidemia   Patient recheck medication hypertension doing well no complaints from medications taken faithfully without side effects Cholesterol doing well taking simvastatin 20 no side effects started co q10 and doing well  Relevant past medical, surgical, family and social history reviewed and updated as indicated. Interim medical history since our last visit reviewed. Allergies and medications reviewed and updated.  Review of Systems  Constitutional: Negative.   Respiratory: Negative.   Cardiovascular: Negative.     Per HPI unless specifically indicated above     Objective:    BP 124/71 (BP Location: Left Arm, Patient Position: Sitting, Cuff Size: Normal)   Pulse 82   Temp 98 F (36.7 C)   Ht 6\' 3"  (1.905 m) Comment: with shoes  Wt 231 lb (104.8 kg) Comment: with shoes  SpO2 97%   BMI 28.87 kg/m   Wt Readings from Last 3 Encounters:  07/07/16 231 lb (104.8 kg)  01/17/16 236 lb (107 kg)  11/28/15 230 lb (104.3 kg)    Physical Exam  Constitutional: He is oriented to person, place, and time. He appears well-developed and well-nourished. No distress.  HENT:  Head: Normocephalic and atraumatic.  Right Ear: Hearing normal.  Left Ear: Hearing normal.  Nose: Nose normal.  Eyes: Conjunctivae and lids are normal. Right eye exhibits no discharge. Left eye exhibits no discharge. No scleral icterus.  Cardiovascular: Normal rate, regular rhythm and normal heart sounds.   Pulmonary/Chest: Effort normal and breath sounds normal. No respiratory  distress.  Musculoskeletal: Normal range of motion.  Neurological: He is alert and oriented to person, place, and time.  Skin: Skin is intact. No rash noted.  Psychiatric: He has a normal mood and affect. His speech is normal and behavior is normal. Judgment and thought content normal. Cognition and memory are normal.    Results for orders placed or performed in visit on 12/12/15  CBC with Differential/Platelet  Result Value Ref Range   WBC 5.9 3.4 - 10.8 x10E3/uL   RBC 4.94 4.14 - 5.80 x10E6/uL   Hemoglobin 15.2 12.6 - 17.7 g/dL   Hematocrit 44.1 37.5 - 51.0 %   MCV 89 79 - 97 fL   MCH 30.8 26.6 - 33.0 pg   MCHC 34.5 31.5 - 35.7 g/dL   RDW 13.8 12.3 - 15.4 %   Platelets 214 150 - 379 x10E3/uL   Neutrophils 58 %   Lymphs 31 %   Monocytes 6 %   Eos 4 %   Basos 1 %   Neutrophils Absolute 3.4 1.4 - 7.0 x10E3/uL   Lymphocytes Absolute 1.8 0.7 - 3.1 x10E3/uL   Monocytes Absolute 0.4 0.1 - 0.9 x10E3/uL   EOS (ABSOLUTE) 0.2 0.0 - 0.4 x10E3/uL   Basophils Absolute 0.0 0.0 - 0.2 x10E3/uL   Immature Granulocytes 0 %   Immature Grans (Abs) 0.0 0.0 - 0.1 x10E3/uL      Assessment & Plan:   Problem List Items Addressed  This Visit      Cardiovascular and Mediastinum   Hypertension   Relevant Medications   lisinopril (PRINIVIL,ZESTRIL) 10 MG tablet   Other Relevant Orders   LP+ALT+AST Piccolo, Waived   Basic metabolic panel     Other   Hyperlipidemia - Primary   Relevant Medications   lisinopril (PRINIVIL,ZESTRIL) 10 MG tablet   Other Relevant Orders   LP+ALT+AST Piccolo, Waived   Basic metabolic panel    Other Visit Diagnoses    Need for pneumococcal vaccination       Relevant Orders   Pneumococcal conjugate vaccine 13-valent IM (Completed)       Follow up plan: Return in about 6 months (around 01/07/2017) for Physical Exam.

## 2016-07-08 ENCOUNTER — Encounter: Payer: Self-pay | Admitting: Family Medicine

## 2016-07-08 LAB — BASIC METABOLIC PANEL
BUN/Creatinine Ratio: 25 — ABNORMAL HIGH (ref 10–24)
BUN: 21 mg/dL (ref 8–27)
CALCIUM: 9.1 mg/dL (ref 8.6–10.2)
CO2: 25 mmol/L (ref 18–29)
CREATININE: 0.83 mg/dL (ref 0.76–1.27)
Chloride: 100 mmol/L (ref 96–106)
GFR calc Af Amer: 107 mL/min/{1.73_m2} (ref >59)
GFR calc non Af Amer: 92 mL/min/{1.73_m2} (ref >59)
GLUCOSE: 95 mg/dL (ref 65–99)
POTASSIUM: 4.3 mmol/L (ref 3.5–5.2)
SODIUM: 142 mmol/L (ref 134–144)

## 2016-07-22 ENCOUNTER — Other Ambulatory Visit: Payer: Self-pay | Admitting: Family Medicine

## 2016-07-22 DIAGNOSIS — J302 Other seasonal allergic rhinitis: Secondary | ICD-10-CM

## 2016-08-31 ENCOUNTER — Telehealth: Payer: Self-pay

## 2016-08-31 ENCOUNTER — Telehealth: Payer: Self-pay | Admitting: Family Medicine

## 2016-08-31 MED ORDER — SIMVASTATIN 20 MG PO TABS: 20.0000 mg | ORAL_TABLET | Freq: Every day | ORAL | 3 refills | Status: DC

## 2016-08-31 NOTE — Telephone Encounter (Signed)
Patient is requesting a 90 day supply of Simvastatin 20mg  to be sent to [facility]

## 2016-08-31 NOTE — Telephone Encounter (Signed)
Pt is at the pharmacy now wants to get a shingles vaccine. [facility] [place] needs [facility] for the vaccine sent to the pharmacy before vaccine can be administered. Please send RX ASAP. Pt waiting at the pharmacy. Thanks.

## 2016-09-01 NOTE — Telephone Encounter (Signed)
[facility] faxed to [facility] 08/31/16. Thanks.

## 2016-11-19 ENCOUNTER — Other Ambulatory Visit: Payer: Self-pay | Admitting: Family Medicine

## 2016-11-20 ENCOUNTER — Other Ambulatory Visit: Payer: Self-pay

## 2016-11-20 MED ORDER — MELOXICAM 15 MG PO TABS: 15.0000 mg | ORAL_TABLET | Freq: Every day | ORAL | 1 refills | Status: DC

## 2016-11-20 NOTE — Telephone Encounter (Signed)
Last OV: 07/07/16 Next OV: 01/14/17

## 2016-12-30 DIAGNOSIS — L570 Actinic keratosis: Secondary | ICD-10-CM | POA: Diagnosis not present

## 2016-12-30 DIAGNOSIS — Z08 Encounter for follow-up examination after completed treatment for malignant neoplasm: Secondary | ICD-10-CM | POA: Diagnosis not present

## 2016-12-30 DIAGNOSIS — D485 Neoplasm of uncertain behavior of skin: Secondary | ICD-10-CM | POA: Diagnosis not present

## 2016-12-30 DIAGNOSIS — L538 Other specified erythematous conditions: Secondary | ICD-10-CM | POA: Diagnosis not present

## 2016-12-30 DIAGNOSIS — C44519 Basal cell carcinoma of skin of other part of trunk: Secondary | ICD-10-CM | POA: Diagnosis not present

## 2016-12-30 DIAGNOSIS — Z85828 Personal history of other malignant neoplasm of skin: Secondary | ICD-10-CM | POA: Diagnosis not present

## 2016-12-30 DIAGNOSIS — L820 Inflamed seborrheic keratosis: Secondary | ICD-10-CM | POA: Diagnosis not present

## 2017-01-12 ENCOUNTER — Other Ambulatory Visit: Payer: Self-pay | Admitting: Family Medicine

## 2017-01-12 NOTE — Telephone Encounter (Signed)
  Last routine OV: 07/07/16 Next OV: 01/14/17

## 2017-01-14 ENCOUNTER — Ambulatory Visit (INDEPENDENT_AMBULATORY_CARE_PROVIDER_SITE_OTHER): Payer: PPO | Admitting: Family Medicine

## 2017-01-14 ENCOUNTER — Encounter: Payer: Self-pay | Admitting: Family Medicine

## 2017-01-14 VITALS — BP 132/78 | HR 71 | Ht 73.62 in | Wt 230.0 lb

## 2017-01-14 DIAGNOSIS — Z1329 Encounter for screening for other suspected endocrine disorder: Secondary | ICD-10-CM | POA: Diagnosis not present

## 2017-01-14 DIAGNOSIS — Z0000 Encounter for general adult medical examination without abnormal findings: Secondary | ICD-10-CM

## 2017-01-14 DIAGNOSIS — Z1159 Encounter for screening for other viral diseases: Secondary | ICD-10-CM

## 2017-01-14 DIAGNOSIS — C61 Malignant neoplasm of prostate: Secondary | ICD-10-CM

## 2017-01-14 DIAGNOSIS — I10 Essential (primary) hypertension: Secondary | ICD-10-CM | POA: Diagnosis not present

## 2017-01-14 DIAGNOSIS — E785 Hyperlipidemia, unspecified: Secondary | ICD-10-CM | POA: Diagnosis not present

## 2017-01-14 LAB — URINALYSIS, ROUTINE W REFLEX MICROSCOPIC
Bilirubin, UA: NEGATIVE
GLUCOSE, UA: NEGATIVE
KETONES UA: NEGATIVE
LEUKOCYTES UA: NEGATIVE
NITRITE UA: NEGATIVE
Protein, UA: NEGATIVE
RBC UA: NEGATIVE
SPEC GRAV UA: 1.025 (ref 1.005–1.030)
UUROB: 0.2 mg/dL (ref 0.2–1.0)
pH, UA: 6 (ref 5.0–7.5)

## 2017-01-14 MED ORDER — SIMVASTATIN 20 MG PO TABS: 20.0000 mg | ORAL_TABLET | Freq: Every day | ORAL | 4 refills | Status: DC

## 2017-01-14 MED ORDER — LISINOPRIL 10 MG PO TABS: 10.0000 mg | ORAL_TABLET | Freq: Every day | ORAL | 4 refills | Status: DC

## 2017-01-14 MED ORDER — MELOXICAM 15 MG PO TABS: 15.0000 mg | ORAL_TABLET | Freq: Every day | ORAL | 3 refills | Status: DC

## 2017-01-14 NOTE — Progress Notes (Signed)
BP 132/78 (BP Location: Left Arm)   Pulse 71   Ht 6' 1.62" (1.87 m)   Wt 230 lb (104.3 kg)   SpO2 98%   BMI 29.83 kg/m    Subjective:    Patient ID: Christopher Huang, male    DOB: Aug 02, 1951, 66 y.o.   MRN: [id number]  HPI: Christopher Huang is a 66 y.o. male  Chief Complaint  Patient presents with  . Annual Exam  Patient all in all doing well with ongoing musculoskeletal complaints takes meloxicam for some ongoing back complaints has had thumb surgery both thumbs and wearing thumb splint on the left thumb. Otherwise doing well. Takes simvastatin without problems or issues also blood pressure lisinopril without problems or issues.  Relevant past medical, surgical, family and social history reviewed and updated as indicated. Interim medical history since our last visit reviewed. Allergies and medications reviewed and updated.  Review of Systems  Constitutional: Negative.   HENT: Negative.   Eyes: Negative.   Respiratory: Negative.   Cardiovascular: Negative.   Gastrointestinal: Negative.   Endocrine: Negative.   Genitourinary: Negative.   Musculoskeletal: Negative.   Skin: Negative.   Allergic/Immunologic: Negative.   Neurological: Negative.   Hematological: Negative.   Psychiatric/Behavioral: Negative.     Per HPI unless specifically indicated above     Objective:    BP 132/78 (BP Location: Left Arm)   Pulse 71   Ht 6' 1.62" (1.87 m)   Wt 230 lb (104.3 kg)   SpO2 98%   BMI 29.83 kg/m   Wt Readings from Last 3 Encounters:  01/14/17 230 lb (104.3 kg)  07/07/16 231 lb (104.8 kg)  01/17/16 236 lb (107 kg)    Physical Exam  Constitutional: He is oriented to person, place, and time. He appears well-developed and well-nourished.  HENT:  Head: Normocephalic and atraumatic.  Right Ear: External ear normal.  Left Ear: External ear normal.  Eyes: Conjunctivae and EOM are normal. Pupils are equal, round, and reactive to light.  Neck: Normal range of motion.  Neck supple.  Cardiovascular: Normal rate, regular rhythm, normal heart sounds and intact distal pulses.   Pulmonary/Chest: Effort normal and breath sounds normal.  Abdominal: Soft. Bowel sounds are normal. There is no splenomegaly or hepatomegaly.  Genitourinary: Rectum normal, prostate normal and penis normal.  Musculoskeletal: Normal range of motion.  Neurological: He is alert and oriented to person, place, and time. He has normal reflexes.  Skin: No rash noted. No erythema.  Psychiatric: He has a normal mood and affect. His behavior is normal. Judgment and thought content normal.    Results for orders placed or performed in visit on 07/07/16  LP+ALT+AST Piccolo, [employer]  Result Value Ref Range   ALT (SGPT) Piccolo, Waived 23 10 - 47 U/L   AST (SGOT) Piccolo, Waived 28 11 - 38 U/L   Cholesterol Piccolo, Waived 206 (H) <200 mg/dL   HDL Chol Piccolo, Waived 52 (L) >59 mg/dL   Triglycerides Piccolo,Waived 241 (H) <150 mg/dL   Chol/HDL Ratio Piccolo,Waive 4.0 mg/dL   LDL Chol Calc Piccolo Waived 106 (H) <100 mg/dL   VLDL Chol Calc Piccolo,Waive 48 (H) <30 mg/dL  Basic metabolic panel  Result Value Ref Range   Glucose 95 65 - 99 mg/dL   BUN 21 8 - 27 mg/dL   Creatinine, Ser 0.83 0.76 - 1.27 mg/dL   GFR calc non Af Amer 92 >59 mL/min/1.73   GFR calc Af Amer 107 >59 mL/min/1.73  BUN/Creatinine Ratio 25 (H) 10 - 24   Sodium 142 134 - 144 mmol/L   Potassium 4.3 3.5 - 5.2 mmol/L   Chloride 100 96 - 106 mmol/L   CO2 25 18 - 29 mmol/L   Calcium 9.1 8.6 - 10.2 mg/dL      Assessment & Plan:   Problem List Items Addressed This Visit      Cardiovascular and Mediastinum   Hypertension    The current medical regimen is effective;  continue present plan and medications.       Relevant Medications   lisinopril (PRINIVIL,ZESTRIL) 10 MG tablet   simvastatin (ZOCOR) 20 MG tablet   Other Relevant Orders   CBC with Differential/Platelet   Comprehensive metabolic panel   Urinalysis,  Routine w reflex microscopic     Genitourinary   Cancer of prostate ([place])    stable      Relevant Orders   PSA     Other   Hyperlipidemia    The current medical regimen is effective;  continue present plan and medications.       Relevant Medications   lisinopril (PRINIVIL,ZESTRIL) 10 MG tablet   simvastatin (ZOCOR) 20 MG tablet   Other Relevant Orders   CBC with Differential/Platelet   Lipid panel   Comprehensive metabolic panel   Urinalysis, Routine w reflex microscopic    Other Visit Diagnoses    Annual physical exam    -  Primary   Relevant Orders   CBC with Differential/Platelet   Lipid panel   Comprehensive metabolic panel   PSA   TSH   Urinalysis, Routine w reflex microscopic   Hepatitis C Antibody   Thyroid disorder screen       Relevant Orders   TSH   Need for hepatitis C screening test       Relevant Orders   Hepatitis C Antibody       Follow up plan: Return in about 6 months (around 07/14/2017) for BMP,  Lipids, ALT, AST.

## 2017-01-14 NOTE — Assessment & Plan Note (Signed)
The current medical regimen is effective;  continue present plan and medications.  

## 2017-01-14 NOTE — Assessment & Plan Note (Signed)
stable °

## 2017-01-15 LAB — COMPREHENSIVE METABOLIC PANEL
ALBUMIN: 4.3 g/dL (ref 3.6–4.8)
ALT: 22 IU/L (ref 0–44)
AST: 22 IU/L (ref 0–40)
Albumin/Globulin Ratio: 2.3 — ABNORMAL HIGH (ref 1.2–2.2)
Alkaline Phosphatase: 79 IU/L (ref 39–117)
BILIRUBIN TOTAL: 0.5 mg/dL (ref 0.0–1.2)
BUN / CREAT RATIO: 25 — AB (ref 10–24)
BUN: 22 mg/dL (ref 8–27)
CHLORIDE: 101 mmol/L (ref 96–106)
CO2: 26 mmol/L (ref 18–29)
Calcium: 9.1 mg/dL (ref 8.6–10.2)
Creatinine, Ser: 0.88 mg/dL (ref 0.76–1.27)
GFR, EST AFRICAN AMERICAN: 103 (ref >59)
GFR, EST NON AFRICAN AMERICAN: 90 (ref >59)
GLOBULIN, TOTAL: 1.9 (ref 1.5–4.5)
Glucose: 98 mg/dL (ref 65–99)
POTASSIUM: 4.5 mmol/L (ref 3.5–5.2)
SODIUM: 141 mmol/L (ref 134–144)
TOTAL PROTEIN: 6.2 g/dL (ref 6.0–8.5)

## 2017-01-15 LAB — CBC WITH DIFFERENTIAL/PLATELET
BASOS ABS: 0 10*3/uL (ref 0.0–0.2)
Basos: 1 %
EOS (ABSOLUTE): 0.2 10*3/uL (ref 0.0–0.4)
EOS: 5 %
HEMATOCRIT: 43.2 % (ref 37.5–51.0)
Hemoglobin: 14.8 g/dL (ref 13.0–17.7)
IMMATURE GRANULOCYTES: 0 %
Immature Grans (Abs): 0 10*3/uL (ref 0.0–0.1)
Lymphocytes Absolute: 1.5 10*3/uL (ref 0.7–3.1)
Lymphs: 33 %
MCH: 30.5 pg (ref 26.6–33.0)
MCHC: 34.3 g/dL (ref 31.5–35.7)
MCV: 89 fL (ref 79–97)
MONOS ABS: 0.3 10*3/uL (ref 0.1–0.9)
Monocytes: 8 %
NEUTROS PCT: 53 %
Neutrophils Absolute: 2.4 10*3/uL (ref 1.4–7.0)
PLATELETS: 245 10*3/uL (ref 150–379)
RBC: 4.86 x10E6/uL (ref 4.14–5.80)
RDW: 14.7 % (ref 12.3–15.4)
WBC: 4.4 10*3/uL (ref 3.4–10.8)

## 2017-01-15 LAB — TSH: TSH: 2.44 u[IU]/mL (ref 0.450–4.500)

## 2017-01-15 LAB — LIPID PANEL
CHOL/HDL RATIO: 4.2 (ref 0.0–5.0)
CHOLESTEROL TOTAL: 184 mg/dL (ref 100–199)
HDL: 44 mg/dL (ref >39)
LDL Calculated: 96 (ref 0–99)
Triglycerides: 221 mg/dL — ABNORMAL HIGH (ref 0–149)
VLDL Cholesterol Cal: 44 — ABNORMAL HIGH (ref 5–40)

## 2017-01-15 LAB — PSA: Prostate Specific Ag, Serum: 0.2 ng/mL (ref 0.0–4.0)

## 2017-01-15 LAB — HEPATITIS C ANTIBODY: Hep C Virus Ab: <0.1 s/co ratio (ref 0.0–0.9)

## 2017-01-18 ENCOUNTER — Encounter: Payer: Self-pay | Admitting: Family Medicine

## 2017-02-05 DIAGNOSIS — X32XXXA Exposure to sunlight, initial encounter: Secondary | ICD-10-CM | POA: Diagnosis not present

## 2017-02-05 DIAGNOSIS — C44519 Basal cell carcinoma of skin of other part of trunk: Secondary | ICD-10-CM | POA: Diagnosis not present

## 2017-02-05 DIAGNOSIS — L570 Actinic keratosis: Secondary | ICD-10-CM | POA: Diagnosis not present

## 2017-02-09 ENCOUNTER — Telehealth: Payer: Self-pay | Admitting: Family Medicine

## 2017-02-09 NOTE — Telephone Encounter (Signed)
[facility] [employer] uses [facility]. Pharmacy changed in system.

## 2017-02-09 NOTE — Telephone Encounter (Signed)
Patient has changed insurance coverage and needs for [place] to have his pharmacy information changed to the following...  Patient can be reached at [phone] or [phone] a message can be left at either  [place] [facility]  Fax [phone] Phone [phone]  It can also be done electronically---[phone]  Thanks

## 2017-04-11 ENCOUNTER — Other Ambulatory Visit: Payer: Self-pay | Admitting: Family Medicine

## 2017-05-18 DIAGNOSIS — L570 Actinic keratosis: Secondary | ICD-10-CM | POA: Diagnosis not present

## 2017-05-18 DIAGNOSIS — X32XXXA Exposure to sunlight, initial encounter: Secondary | ICD-10-CM | POA: Diagnosis not present

## 2017-05-18 DIAGNOSIS — D225 Melanocytic nevi of trunk: Secondary | ICD-10-CM | POA: Diagnosis not present

## 2017-05-18 DIAGNOSIS — D2261 Melanocytic nevi of right upper limb, including shoulder: Secondary | ICD-10-CM | POA: Diagnosis not present

## 2017-05-18 DIAGNOSIS — Z85828 Personal history of other malignant neoplasm of skin: Secondary | ICD-10-CM | POA: Diagnosis not present

## 2017-05-18 DIAGNOSIS — D2272 Melanocytic nevi of left lower limb, including hip: Secondary | ICD-10-CM | POA: Diagnosis not present

## 2017-07-13 ENCOUNTER — Other Ambulatory Visit: Payer: Self-pay | Admitting: Family Medicine

## 2017-07-13 DIAGNOSIS — J302 Other seasonal allergic rhinitis: Secondary | ICD-10-CM

## 2017-07-15 ENCOUNTER — Encounter: Payer: Self-pay | Admitting: Family Medicine

## 2017-07-15 ENCOUNTER — Ambulatory Visit (INDEPENDENT_AMBULATORY_CARE_PROVIDER_SITE_OTHER): Payer: PPO | Admitting: Family Medicine

## 2017-07-15 VITALS — BP 129/78 | HR 79 | Wt 233.0 lb

## 2017-07-15 DIAGNOSIS — E785 Hyperlipidemia, unspecified: Secondary | ICD-10-CM | POA: Diagnosis not present

## 2017-07-15 DIAGNOSIS — I10 Essential (primary) hypertension: Secondary | ICD-10-CM

## 2017-07-15 LAB — LP+ALT+AST PICCOLO, WAIVED
ALT (SGPT) PICCOLO, WAIVED: 21 U/L (ref 10–47)
AST (SGOT) Piccolo, Waived: 31 U/L (ref 11–38)
CHOLESTEROL PICCOLO, WAIVED: 185 mg/dL (ref <200)
Chol/HDL Ratio Piccolo,Waive: 3.5 mg/dL
HDL CHOL PICCOLO, WAIVED: 53 mg/dL — AB (ref >59)
LDL CHOL CALC PICCOLO WAIVED: 88 mg/dL (ref <100)
Triglycerides Piccolo,Waived: 223 mg/dL — ABNORMAL HIGH (ref <150)
VLDL CHOL CALC PICCOLO,WAIVE: 45 mg/dL — AB (ref <30)

## 2017-07-15 NOTE — Assessment & Plan Note (Signed)
The current medical regimen is effective;  continue present plan and medications.  

## 2017-07-15 NOTE — Progress Notes (Signed)
BP 129/78   Pulse 79   Wt 233 lb (105.7 kg)   SpO2 99%   BMI 30.22 kg/m    Subjective:    Patient ID: Christopher Huang, male    DOB: 1951/06/22, 66 y.o.   MRN: [id number]  HPI: Christopher Huang is a 66 y.o. male  Chief Complaint  Patient presents with  . Follow-up   Patient all in all doing well blood pressure good control no issues with medications takes faithfully. Cholesterol also doing well takes faithfully without side effects or issues. Takes meloxicam from time to time for arthralgias especially with his thumbs. Patient's had surgery on both thumbs and is all in all doing well. Relevant past medical, surgical, family and social history reviewed and updated as indicated. Interim medical history since our last visit reviewed. Allergies and medications reviewed and updated.  Review of Systems  Constitutional: Negative.   Respiratory: Negative.   Cardiovascular: Negative.     Per HPI unless specifically indicated above     Objective:    BP 129/78   Pulse 79   Wt 233 lb (105.7 kg)   SpO2 99%   BMI 30.22 kg/m   Wt Readings from Last 3 Encounters:  07/15/17 233 lb (105.7 kg)  01/14/17 230 lb (104.3 kg)  07/07/16 231 lb (104.8 kg)    Physical Exam  Constitutional: He is oriented to person, place, and time. He appears well-developed and well-nourished.  HENT:  Head: Normocephalic and atraumatic.  Eyes: Conjunctivae and EOM are normal.  Neck: Normal range of motion.  Cardiovascular: Normal rate, regular rhythm and normal heart sounds.   Pulmonary/Chest: Effort normal and breath sounds normal.  Musculoskeletal: Normal range of motion.  Neurological: He is alert and oriented to person, place, and time.  Skin: No erythema.  Psychiatric: He has a normal mood and affect. His behavior is normal. Judgment and thought content normal.    Results for orders placed or performed in visit on 01/14/17  CBC with Differential/Platelet  Result Value Ref Range   WBC 4.4  3.4 - 10.8 x10E3/uL   RBC 4.86 4.14 - 5.80 x10E6/uL   Hemoglobin 14.8 13.0 - 17.7 g/dL   Hematocrit 43.2 37.5 - 51.0 %   MCV 89 79 - 97 fL   MCH 30.5 26.6 - 33.0 pg   MCHC 34.3 31.5 - 35.7 g/dL   RDW 14.7 12.3 - 15.4 %   Platelets 245 150 - 379 x10E3/uL   Neutrophils 53 Not Estab. %   Lymphs 33 Not Estab. %   Monocytes 8 Not Estab. %   Eos 5 Not Estab. %   Basos 1 Not Estab. %   Neutrophils Absolute 2.4 1.4 - 7.0 x10E3/uL   Lymphocytes Absolute 1.5 0.7 - 3.1 x10E3/uL   Monocytes Absolute 0.3 0.1 - 0.9 x10E3/uL   EOS (ABSOLUTE) 0.2 0.0 - 0.4 x10E3/uL   Basophils Absolute 0.0 0.0 - 0.2 x10E3/uL   Immature Granulocytes 0 Not Estab. %   Immature Grans (Abs) 0.0 0.0 - 0.1 x10E3/uL  Lipid panel  Result Value Ref Range   Cholesterol, Total 184 100 - 199 mg/dL   Triglycerides 221 (H) 0 - 149 mg/dL   HDL 44 >39 mg/dL   VLDL Cholesterol Cal 44 (H) 5 - 40   LDL Calculated 96 0 - 99   Chol/HDL Ratio 4.2 0.0 - 5.0  Comprehensive metabolic panel  Result Value Ref Range   Glucose 98 65 - 99 mg/dL   BUN 22  8 - 27 mg/dL   Creatinine, Ser 0.88 0.76 - 1.27 mg/dL   GFR calc non Af Amer 90 >59   GFR calc Af Amer 103 >59   BUN/Creatinine Ratio 25 (H) 10 - 24   Sodium 141 134 - 144 mmol/L   Potassium 4.5 3.5 - 5.2 mmol/L   Chloride 101 96 - 106 mmol/L   CO2 26 18 - 29 mmol/L   Calcium 9.1 8.6 - 10.2 mg/dL   Total Protein 6.2 6.0 - 8.5 g/dL   Albumin 4.3 3.6 - 4.8 g/dL   Globulin, Total 1.9 1.5 - 4.5   Albumin/Globulin Ratio 2.3 (H) 1.2 - 2.2   Bilirubin Total 0.5 0.0 - 1.2 mg/dL   Alkaline Phosphatase 79 39 - 117 IU/L   AST 22 0 - 40 IU/L   ALT 22 0 - 44 IU/L  PSA  Result Value Ref Range   Prostate Specific Ag, Serum 0.2 0.0 - 4.0 ng/mL  TSH  Result Value Ref Range   TSH 2.440 0.450 - 4.500 uIU/mL  Urinalysis, Routine w reflex microscopic  Result Value Ref Range   Specific Gravity, UA 1.025 1.005 - 1.030   pH, UA 6.0 5.0 - 7.5   Color, UA Yellow Yellow   Appearance Ur Clear  Clear   Leukocytes, UA Negative Negative   Protein, UA Negative Negative/Trace   Glucose, UA Negative Negative   Ketones, UA Negative Negative   RBC, UA Negative Negative   Bilirubin, UA Negative Negative   Urobilinogen, Ur 0.2 0.2 - 1.0 mg/dL   Nitrite, UA Negative Negative  Hepatitis C Antibody  Result Value Ref Range   Hep C Virus Ab <0.1 0.0 - 0.9 s/co ratio      Assessment & Plan:   Problem List Items Addressed This Visit      Cardiovascular and Mediastinum   Hypertension - Primary    The current medical regimen is effective;  continue present plan and medications.       Relevant Orders   Basic metabolic panel   LP+ALT+AST Piccolo, Waived     Other   Hyperlipidemia    The current medical regimen is effective;  continue present plan and medications.       Relevant Orders   Basic metabolic panel   LP+ALT+AST Piccolo, Waived       Follow up plan: Return in about 6 months (around 01/15/2018) for Physical Exam.

## 2017-07-16 LAB — BASIC METABOLIC PANEL
BUN / CREAT RATIO: 20 (ref 10–24)
BUN: 17 mg/dL (ref 8–27)
CHLORIDE: 103 mmol/L (ref 96–106)
CO2: 25 mmol/L (ref 20–29)
Calcium: 9.3 mg/dL (ref 8.6–10.2)
Creatinine, Ser: 0.86 mg/dL (ref 0.76–1.27)
GFR calc non Af Amer: 90 mL/min/{1.73_m2} (ref >59)
GFR, EST AFRICAN AMERICAN: 104 mL/min/{1.73_m2} (ref >59)
Glucose: 95 mg/dL (ref 65–99)
POTASSIUM: 4.7 mmol/L (ref 3.5–5.2)
Sodium: 141 mmol/L (ref 134–144)

## 2017-07-18 ENCOUNTER — Encounter: Payer: Self-pay | Admitting: Family Medicine

## 2017-08-18 ENCOUNTER — Telehealth: Payer: Self-pay | Admitting: Family Medicine

## 2017-08-18 DIAGNOSIS — Z1211 Encounter for screening for malignant neoplasm of colon: Secondary | ICD-10-CM

## 2017-08-18 NOTE — Telephone Encounter (Signed)
Patient called in regards to being due for his next colonoscopy. Patient wanted to know how he could go about getting it done in regards to if he needs a referral or anything.   Patient also wanted to know when his last flu shot was.   Looked in Chart on my end:  Showing in chart that colonoscopy due : 03/27/2017 Showing in chart that Influenza due 058/11/2016  Please Advise.  Thank you

## 2017-08-18 NOTE — Telephone Encounter (Signed)
Colonoscopy order placed. Called and left message to make pt aware. Also left last flu shot info and advised pt that he can come by for flu vaccine if he wishes.

## 2017-08-19 ENCOUNTER — Telehealth: Payer: Self-pay | Admitting: Family Medicine

## 2017-08-19 DIAGNOSIS — Z23 Encounter for immunization: Secondary | ICD-10-CM | POA: Diagnosis not present

## 2017-08-19 NOTE — Telephone Encounter (Signed)
Called and spoke to patient. I let him know that he is due for his flu shot and that he could stop by for his flu shot whenever he would like to.

## 2017-08-19 NOTE — Telephone Encounter (Signed)
Would like to know when he is due for flu shot. So he can go ahead and get it do it done this morning with his wife. Looked in chart and looks like he is due 06/23/2017.  Please Advise.  Thank you

## 2017-09-17 ENCOUNTER — Other Ambulatory Visit: Payer: Self-pay

## 2017-09-17 ENCOUNTER — Telehealth: Payer: Self-pay

## 2017-09-17 DIAGNOSIS — Z1211 Encounter for screening for malignant neoplasm of colon: Secondary | ICD-10-CM

## 2017-09-17 DIAGNOSIS — Z1212 Encounter for screening for malignant neoplasm of rectum: Principal | ICD-10-CM

## 2017-09-17 NOTE — Telephone Encounter (Signed)
Gastroenterology Pre-Procedure Review  Request Date:  Requesting Physician: Dr.   PATIENT REVIEW QUESTIONS: The patient responded to the following health history questions as indicated:    1. Are you having any GI issues? no 2. Do you have a personal history of Polyps? no 3. Do you have a family history of Colon Cancer or Polyps? no 4. Diabetes Mellitus? no 5. Joint replacements in the past 12 months?no 6. Major health problems in the past 3 months?no 7. Any artificial heart valves, MVP, or defibrillator?no    MEDICATIONS & ALLERGIES:    Patient reports the following regarding taking any anticoagulation/antiplatelet therapy:   Plavix, Coumadin, Eliquis, Xarelto, Lovenox, Pradaxa, Brilinta, or Effient? no Aspirin? no  Patient confirms/reports the following medications:  Current Outpatient Prescriptions  Medication Sig Dispense Refill  . aspirin EC 81 MG tablet Take 81 mg by mouth daily.    . cetirizine (ZYRTEC) 10 MG tablet Take 1 tablet (10 mg total) by mouth daily. (Patient taking differently: Take 10 mg by mouth as needed. ) 30 tablet 11  . co-enzyme Q-10 30 MG capsule Take 30 mg by mouth daily.     . fluticasone (FLONASE) 50 MCG/ACT nasal spray PLACE 2 SPRAYS INTO EACH NOSTRIL DAILY 16 g 12  . lisinopril (PRINIVIL,ZESTRIL) 10 MG tablet Take 1 tablet (10 mg total) by mouth daily. 90 tablet 4  . meloxicam (MOBIC) 15 MG tablet Take 1 tablet (15 mg total) by mouth daily. 90 tablet 3  . Multiple Vitamin (MULTIVITAMIN WITH MINERALS) TABS tablet Take 1 tablet by mouth daily.    . simvastatin (ZOCOR) 20 MG tablet Take 1 tablet (20 mg total) by mouth at bedtime. 90 tablet 4  . sodium chloride (OCEAN) 0.65 % SOLN nasal spray Place 2 sprays into both nostrils as needed for congestion.  0   No current facility-administered medications for this visit.     Patient confirms/reports the following allergies:  Allergies  Allergen Reactions  . Percocet [Oxycodone-Acetaminophen]   . Tramadol    . Vicodin [Hydrocodone-Acetaminophen]     No orders of the defined types were placed in this encounter.   AUTHORIZATION INFORMATION Primary Insurance: 1D#: Group #:  Secondary Insurance: 1D#: Group #:  SCHEDULE INFORMATION: Date:  Time: Location:

## 2017-10-06 ENCOUNTER — Telehealth: Payer: Self-pay | Admitting: Family Medicine

## 2017-10-06 ENCOUNTER — Other Ambulatory Visit: Payer: Self-pay

## 2017-10-06 DIAGNOSIS — J302 Other seasonal allergic rhinitis: Secondary | ICD-10-CM

## 2017-10-06 MED ORDER — PEG 3350-KCL-NA BICARB-NACL 420 G PO SOLR: 4000.0000 mL | Freq: Once | ORAL | 0 refills | Status: AC

## 2017-10-06 MED ORDER — FLUTICASONE PROPIONATE 50 MCG/ACT NA SUSP: NASAL | 12 refills | Status: DC

## 2017-10-06 NOTE — Telephone Encounter (Signed)
Copied from [place]. Topic: General - Other >> Oct 06, 2017  8:30 AM Yvette Rack wrote: Reason for CRM:  medicine refill on Fluticasone please send to the [facility] in [place]

## 2017-11-01 ENCOUNTER — Telehealth: Payer: Self-pay

## 2017-11-01 NOTE — Telephone Encounter (Signed)
Colonoscopy rescheduled til the 18th due to weather.  Endo notified. Instructions will be mailed once back in office.

## 2017-11-05 ENCOUNTER — Ambulatory Visit: Payer: Self-pay | Admitting: *Deleted

## 2017-11-05 NOTE — Telephone Encounter (Signed)
Given home care advice. Instructed to call back if he is not better or gets worse.   Verbalizes understanding. He is already using Flonase.  I instructed him to keep using it.  He is going to try some over the counter medications I suggested to him from the Home Care advice.    Reason for Disposition . Cold with no complications  Answer Assessment - Initial Assessment Questions 1. ONSET: "When did the nasal discharge start?"      Started about lunch time today.  It came on quick. 2. AMOUNT: "How much discharge is there?"      Runny nose but I'm congested.    Clear-yellow mucus 3. COUGH: "Do you have a cough?" If yes, ask: "Describe the color of your sputum" (clear, white, yellow, green)     Not now 4. RESPIRATORY DISTRESS: "Describe your breathing."      Fine mostly my head throbbing. 5. FEVER: "Do you have a fever?" If so, ask: "What is your temperature, how was it measured, and when did it start?"     Maybe a low grade 6. SEVERITY: "Overall, how bad are you feeling right now?" (e.g., doesn't interfere with normal activities, staying home from school/work, staying in bed)      I'm feeling so bad in my head.   It just started at lunch. 7. OTHER SYMPTOMS: "Do you have any other symptoms?" (e.g., sore throat, earache, wheezing, vomiting)     My left ear has been bothering me for the last wk.    8. PREGNANCY: "Is there any chance you are pregnant?" "When was your last menstrual period?"     N/A  Protocols used: COMMON COLD-A-AH

## 2017-11-08 ENCOUNTER — Encounter: Payer: Self-pay | Admitting: *Deleted

## 2017-11-08 ENCOUNTER — Ambulatory Visit: Payer: PPO

## 2017-11-09 ENCOUNTER — Ambulatory Visit
Admission: RE | Admit: 2017-11-09 | Discharge: 2017-11-09 | Disposition: A | Discharge status: Home / Self Care | Payer: PPO | Source: Ambulatory Visit | Attending: Gastroenterology | Admitting: Gastroenterology

## 2017-11-09 ENCOUNTER — Encounter: Payer: Self-pay | Admitting: *Deleted

## 2017-11-09 ENCOUNTER — Encounter
Admission: RE | Disposition: A | Discharge status: Home / Self Care | Payer: Self-pay | Source: Ambulatory Visit | Attending: Gastroenterology

## 2017-11-09 ENCOUNTER — Ambulatory Visit: Payer: PPO | Admitting: Anesthesiology

## 2017-11-09 DIAGNOSIS — Z860100 Personal history of colon polyps, unspecified: Secondary | ICD-10-CM

## 2017-11-09 DIAGNOSIS — D125 Benign neoplasm of sigmoid colon: Secondary | ICD-10-CM | POA: Diagnosis not present

## 2017-11-09 DIAGNOSIS — K640 First degree hemorrhoids: Secondary | ICD-10-CM | POA: Insufficient documentation

## 2017-11-09 DIAGNOSIS — N189 Chronic kidney disease, unspecified: Secondary | ICD-10-CM | POA: Insufficient documentation

## 2017-11-09 DIAGNOSIS — D126 Benign neoplasm of colon, unspecified: Secondary | ICD-10-CM | POA: Diagnosis not present

## 2017-11-09 DIAGNOSIS — Z79899 Other long term (current) drug therapy: Secondary | ICD-10-CM | POA: Diagnosis not present

## 2017-11-09 DIAGNOSIS — Z1211 Encounter for screening for malignant neoplasm of colon: Secondary | ICD-10-CM | POA: Diagnosis not present

## 2017-11-09 DIAGNOSIS — Z791 Long term (current) use of non-steroidal anti-inflammatories (NSAID): Secondary | ICD-10-CM | POA: Diagnosis not present

## 2017-11-09 DIAGNOSIS — K5730 Diverticulosis of large intestine without perforation or abscess without bleeding: Secondary | ICD-10-CM | POA: Insufficient documentation

## 2017-11-09 DIAGNOSIS — D120 Benign neoplasm of cecum: Secondary | ICD-10-CM

## 2017-11-09 DIAGNOSIS — Z8546 Personal history of malignant neoplasm of prostate: Secondary | ICD-10-CM | POA: Insufficient documentation

## 2017-11-09 DIAGNOSIS — D123 Benign neoplasm of transverse colon: Secondary | ICD-10-CM | POA: Diagnosis not present

## 2017-11-09 DIAGNOSIS — I129 Hypertensive chronic kidney disease with stage 1 through stage 4 chronic kidney disease, or unspecified chronic kidney disease: Secondary | ICD-10-CM | POA: Insufficient documentation

## 2017-11-09 DIAGNOSIS — E785 Hyperlipidemia, unspecified: Secondary | ICD-10-CM | POA: Insufficient documentation

## 2017-11-09 DIAGNOSIS — Z1212 Encounter for screening for malignant neoplasm of rectum: Secondary | ICD-10-CM

## 2017-11-09 DIAGNOSIS — K635 Polyp of colon: Secondary | ICD-10-CM

## 2017-11-09 DIAGNOSIS — Z85828 Personal history of other malignant neoplasm of skin: Secondary | ICD-10-CM | POA: Diagnosis not present

## 2017-11-09 DIAGNOSIS — Z7982 Long term (current) use of aspirin: Secondary | ICD-10-CM | POA: Insufficient documentation

## 2017-11-09 HISTORY — PX: COLONOSCOPY WITH PROPOFOL: SHX5780

## 2017-11-09 HISTORY — DX: Chronic kidney disease, unspecified: N18.9

## 2017-11-09 SURGERY — COLONOSCOPY WITH PROPOFOL
Anesthesia: General

## 2017-11-09 MED ORDER — FENTANYL CITRATE (PF) 100 MCG/2ML IJ SOLN
INTRAMUSCULAR | Status: DC | PRN
  Administered 2017-11-09 (×2): 50 ug via INTRAVENOUS

## 2017-11-09 MED ORDER — MIDAZOLAM HCL 2 MG/2ML IJ SOLN
INTRAMUSCULAR | Status: DC | PRN
  Administered 2017-11-09 (×2): 1 mg via INTRAVENOUS

## 2017-11-09 MED ORDER — SODIUM CHLORIDE 0.9 % IV SOLN
INTRAVENOUS | Status: DC
  Administered 2017-11-09: 08:00:00 via INTRAVENOUS

## 2017-11-09 MED ORDER — PROPOFOL 500 MG/50ML IV EMUL
INTRAVENOUS | Status: DC | PRN
  Administered 2017-11-09: 120 ug/kg/min via INTRAVENOUS

## 2017-11-09 MED ORDER — PROPOFOL 10 MG/ML IV BOLUS
INTRAVENOUS | Status: DC | PRN
Start: 2017-11-09 — End: 2017-11-09
  Administered 2017-11-09: 20 mg via INTRAVENOUS
  Administered 2017-11-09: 30 mg via INTRAVENOUS

## 2017-11-09 NOTE — Transfer of Care (Signed)
Immediate Anesthesia Transfer of Care Note  Patient: Christopher Huang  Procedure(s) Performed: COLONOSCOPY WITH PROPOFOL (N/A )  Patient Location: PACU  Anesthesia Type:General  Level of Consciousness: awake  Airway & Oxygen Therapy: Patient Spontanous Breathing and Patient connected to nasal cannula oxygen  Post-op Assessment: Report given to RN and Post -op Vital signs reviewed and stable  Post vital signs: Reviewed  Last Vitals:  Vitals:   11/09/17 0709  BP: (!) 147/80  Pulse: 73  Resp: 18  Temp: (!) 35.1 C  SpO2: 98%    Last Pain:  Vitals:   11/09/17 0709  TempSrc: Tympanic         Complications: No apparent anesthesia complications

## 2017-11-09 NOTE — Anesthesia Post-op Follow-up Note (Signed)
Anesthesia QCDR form completed.        

## 2017-11-09 NOTE — H&P (Signed)
Christopher Lame, MD Christopher Huang., Christopher Huang, Christopher Huang [id number] [phone] Fax : [phone]  Primary Care Physician:  Christopher Maple, MD Primary Gastroenterologist:  Dr. Allen Huang  Pre-Procedure History & Physical: HPI:  Christopher Huang is a 66 y.o. male is here for an colonoscopy.   Past Medical History:  Diagnosis Date  . Allergic rhinitis   . Cancer of prostate ([place]) 4/08   s/p surgery  . Chronic kidney disease   . Hyperlipidemia   . Hypertension   . Personal history of kidney stones     Past Surgical History:  Procedure Laterality Date  . basal skin cancers    . ELBOW SURGERY Right 2010  . KNEE ARTHROSCOPY    . prostate cancer removed    . TONSILLECTOMY    . TONSILLECTOMY      Prior to Admission medications   Medication Sig Start Date End Date Taking? Authorizing Provider  aspirin EC 81 MG tablet Take 81 mg by mouth daily.   Yes [provider]  cetirizine (ZYRTEC) 10 MG tablet Take 1 tablet (10 mg total) by mouth daily. Patient taking differently: Take 10 mg by mouth as needed.  01/17/16  Yes Krebs, Amy Lauren, NP  co-enzyme Q-10 30 MG capsule Take 30 mg by mouth daily.    Yes [provider]  fluticasone (FLONASE) 50 MCG/ACT nasal spray PLACE 2 SPRAYS INTO EACH NOSTRIL DAILY 10/06/17  Yes Crissman, Jeannette How, MD  lisinopril (PRINIVIL,ZESTRIL) 10 MG tablet Take 1 tablet (10 mg total) by mouth daily. 01/14/17  Yes Crissman, Jeannette How, MD  meloxicam (MOBIC) 15 MG tablet Take 1 tablet (15 mg total) by mouth daily. 01/14/17  Yes Crissman, Jeannette How, MD  Multiple Vitamin (MULTIVITAMIN WITH MINERALS) TABS tablet Take 1 tablet by mouth daily.   Yes [provider]  simvastatin (ZOCOR) 20 MG tablet Take 1 tablet (20 mg total) by mouth at bedtime. 01/14/17  Yes Crissman, Jeannette How, MD  sodium chloride (OCEAN) 0.65 % SOLN nasal spray Place 2 sprays into both nostrils as needed for congestion. 01/17/16  Yes Christopher Axe, NP    Allergies as of  09/19/2017 - Review Complete 07/15/2017  Allergen Reaction Noted  . Percocet [oxycodone-acetaminophen]  03/27/2015  . Tramadol  03/27/2015  . Vicodin [hydrocodone-acetaminophen]  03/27/2015    Family History  Problem Relation Age of Onset  . Dementia Mother   . Cancer Father 71       prostate    Social History   Socioeconomic History  . Marital status: Married    Spouse name: Not on file  . Number of children: Not on file  . Years of education: Not on file  . Highest education level: Not on file  Social Needs  . Financial resource strain: Not on file  . Food insecurity - worry: Not on file  . Food insecurity - inability: Not on file  . Transportation needs - medical: Not on file  . Transportation needs - non-medical: Not on file  Occupational History  . Not on file  Tobacco Use  . Smoking status: Never Smoker  . Smokeless tobacco: Never Used  Substance and Sexual Activity  . Alcohol use: No  . Drug use: No  . Sexual activity: Yes  Other Topics Concern  . Not on file  Social History Narrative  . Not on file    Review of Systems: See HPI, otherwise negative ROS  Physical Exam: BP (!) 147/80   Pulse  73   Temp (!) 95.1 F (35.1 C) (Tympanic)   Resp 18   Ht 6\' 1"  (1.854 m)   Wt 233 lb (105.7 kg)   SpO2 98%   BMI 30.74 kg/m  General:   Alert,  pleasant and cooperative in NAD Head:  Normocephalic and atraumatic. Neck:  Supple; no masses or thyromegaly. Lungs:  Clear throughout to auscultation.    Heart:  Regular rate and rhythm. Abdomen:  Soft, nontender and nondistended. Normal bowel sounds, without guarding, and without rebound.   Neurologic:  Alert and  oriented x4;  grossly normal neurologically.  Impression/Plan: Christopher Huang is here for an colonoscopy to be performed for personal history of colon polyps  Risks, benefits, limitations, and alternatives regarding  colonoscopy have been reviewed with the patient.  Questions have been answered.  All  parties agreeable.   Christopher Lame, MD  11/09/2017, 7:54 AM

## 2017-11-09 NOTE — Op Note (Signed)
[facility] Gastroenterology Patient Name: Christopher Huang Procedure Date: 11/09/2017 7:37 AM MRN: [id number] Account #: [id number] Date of Birth: Jan 12, 1951 Admit Type: Outpatient Age: 66 Room: [facility] ENDO ROOM 4 Gender: Male Note Status: Finalized Procedure:            Colonoscopy Indications:          High risk colon cancer surveillance: Personal history                        of colonic polyps Providers:            Lucilla Lame MD, MD Referring MD:         Guadalupe Maple, MD (Referring MD) Medicines:            Propofol per Anesthesia Complications:        No immediate complications. Procedure:            Pre-Anesthesia Assessment:                       - Prior to the procedure, a History and Physical was                        performed, and patient medications and allergies were                        reviewed. The patient's tolerance of previous                        anesthesia was also reviewed. The risks and benefits of                        the procedure and the sedation options and risks were                        discussed with the patient. All questions were                        answered, and informed consent was obtained. Prior                        Anticoagulants: The patient has taken no previous                        anticoagulant or antiplatelet agents. ASA Grade                        Assessment: II - A patient with mild systemic disease.                        After reviewing the risks and benefits, the patient was                        deemed in satisfactory condition to undergo the                        procedure.                       After obtaining informed consent, the colonoscope was  passed under direct vision. Throughout the procedure,                        the patient's blood pressure, pulse, and oxygen                        saturations were monitored continuously. The                        Colonoscope  was introduced through the anus and                        advanced to the the cecum, identified by appendiceal                        orifice and ileocecal valve. The colonoscopy was                        performed without difficulty. The patient tolerated the                        procedure well. The quality of the bowel preparation                        was excellent. Findings:      The perianal and digital rectal examinations were normal.      A 3 mm polyp was found in the cecum. The polyp was sessile. The polyp       was removed with a cold biopsy forceps. Resection and retrieval were       complete.      A 6 mm polyp was found in the transverse colon. The polyp was sessile.       The polyp was removed with a cold snare. Resection and retrieval were       complete.      Two sessile polyps were found in the descending colon. The polyps were 6       to 7 mm in size. These polyps were removed with a cold snare. Resection       and retrieval were complete.      A 4 mm polyp was found in the sigmoid colon. The polyp was sessile. The       polyp was removed with a cold snare. Resection and retrieval were       complete.      Non-bleeding internal hemorrhoids were found during retroflexion. The       hemorrhoids were Grade I (internal hemorrhoids that do not prolapse).      A few small-mouthed diverticula were found in the sigmoid colon. Impression:           - One 3 mm polyp in the cecum, removed with a cold                        biopsy forceps. Resected and retrieved.                       - One 6 mm polyp in the transverse colon, removed with                        a cold snare. Resected and retrieved.                       -  Two 6 to 7 mm polyps in the descending colon, removed                        with a cold snare. Resected and retrieved.                       - One 4 mm polyp in the sigmoid colon, removed with a                        cold snare. Resected and retrieved.                        - Non-bleeding internal hemorrhoids.                       - Diverticulosis in the sigmoid colon. Recommendation:       - Discharge patient to home.                       - Resume previous diet.                       - Continue present medications.                       - Await pathology results.                       - Repeat colonoscopy in 5 years for surveillance. Procedure Code(s):    --- Professional ---                       [phone], Colonoscopy, flexible; with removal of tumor(s),                        polyp(s), or other lesion(s) by snare technique                       [id number], 75, Colonoscopy, flexible; with biopsy, single                        or multiple Diagnosis Code(s):    --- Professional ---                       Z86.010, Personal history of colonic polyps                       D12.0, Benign neoplasm of cecum                       D12.3, Benign neoplasm of transverse colon (hepatic                        flexure or splenic flexure)                       D12.5, Benign neoplasm of sigmoid colon                       D12.4, Benign neoplasm of descending colon CPT copyright 2016 American Medical Association. All rights reserved. The codes documented in this report are preliminary and upon coder review may  be revised to  meet current compliance requirements. Lucilla Lame MD, MD 11/09/2017 8:23:40 AM This report has been signed electronically. Number of Addenda: 0 Note Initiated On: 11/09/2017 7:37 AM Scope Withdrawal Time: 0 hours 10 minutes 49 seconds  Total Procedure Duration: 0 hours 19 minutes 40 seconds       [facility]

## 2017-11-09 NOTE — Anesthesia Postprocedure Evaluation (Signed)
Anesthesia Post Note  Patient: Christopher Huang  Procedure(s) Performed: COLONOSCOPY WITH PROPOFOL (N/A )  Patient location during evaluation: PACU Anesthesia Type: General Level of consciousness: awake Pain management: pain level controlled Vital Signs Assessment: post-procedure vital signs reviewed and stable Respiratory status: spontaneous breathing Cardiovascular status: stable Anesthetic complications: no     Last Vitals:  Vitals:   11/09/17 0850 11/09/17 0857  BP:  113/72  Pulse: (!) 31 60  Resp: 18 19  Temp:    SpO2: 95% 94%    Last Pain:  Vitals:   11/09/17 0826  TempSrc: Oral                 VAN STAVEREN,Makaylynn Bonillas

## 2017-11-09 NOTE — Anesthesia Preprocedure Evaluation (Signed)
Anesthesia Evaluation  Patient identified by MRN, date of birth, ID band Patient awake    Reviewed: Allergy & Precautions, NPO status , Patient's Chart, lab work & pertinent test results  Airway Mallampati: II       Dental  (+) Teeth Intact   Pulmonary neg pulmonary ROS,    breath sounds clear to auscultation       Cardiovascular Exercise Tolerance: Good hypertension, Pt. on medications  Rhythm:Regular     Neuro/Psych negative neurological ROS  negative psych ROS   GI/Hepatic negative GI ROS, Neg liver ROS,   Endo/Other  negative endocrine ROS  Renal/GU negative Renal ROS     Musculoskeletal   Abdominal Normal abdominal exam  (+)   Peds negative pediatric ROS (+)  Hematology negative hematology ROS (+)   Anesthesia Other Findings   Reproductive/Obstetrics                             Anesthesia Physical Anesthesia Plan  ASA: II  Anesthesia Plan:    Post-op Pain Management:    Induction: Intravenous  PONV Risk Score and Plan: 0  Airway Management Planned: Natural Airway and Nasal Cannula  Additional Equipment:   Intra-op Plan:   Post-operative Plan:   Informed Consent: I have reviewed the patients History and Physical, chart, labs and discussed the procedure including the risks, benefits and alternatives for the proposed anesthesia with the patient or authorized representative who has indicated his/her understanding and acceptance.     Plan Discussed with: Surgeon  Anesthesia Plan Comments:         Anesthesia Quick Evaluation

## 2017-11-10 ENCOUNTER — Ambulatory Visit: Payer: PPO

## 2017-11-10 ENCOUNTER — Encounter: Payer: Self-pay | Admitting: Gastroenterology

## 2017-11-10 LAB — SURGICAL PATHOLOGY

## 2017-11-12 ENCOUNTER — Ambulatory Visit (INDEPENDENT_AMBULATORY_CARE_PROVIDER_SITE_OTHER): Payer: PPO

## 2017-11-12 VITALS — BP 132/82 | HR 70 | Temp 97.9°F | Resp 16 | Ht 74.0 in | Wt 232.2 lb

## 2017-11-12 DIAGNOSIS — Z0000 Encounter for general adult medical examination without abnormal findings: Secondary | ICD-10-CM

## 2017-11-12 DIAGNOSIS — Z23 Encounter for immunization: Secondary | ICD-10-CM | POA: Diagnosis not present

## 2017-11-12 NOTE — Patient Instructions (Addendum)
Christopher Huang , Thank you for taking time to come for your Medicare Wellness Visit. I appreciate your ongoing commitment to your health goals. Please review the following plan we discussed and let me know if I can assist you in the future.   Screening recommendations/referrals: Colonoscopy: completed 11/09/2017 Recommended yearly ophthalmology/optometry visit for glaucoma screening and checkup Recommended yearly dental visit for hygiene and checkup  Vaccinations: Influenza vaccine: up to date  Pneumococcal vaccine: pneumovax 23 done Tdap vaccine: up to date Shingles vaccine: due, check with your insurance company for coverage   Advanced directives: Please bring a copy of your health care power of attorney and living will to the office at your convenience.  Conditions/risks identified: none  Next appointment: Follow up on 01/19/2017 8:00am with Dr.Crissman.  Follow up in one year for your annual wellness exam.   Preventive Care 65 Years and Older, Male Preventive care refers to lifestyle choices and visits with your health care provider that can promote health and wellness. What does preventive care include?  A yearly physical exam. This is also called an annual well check.  Dental exams once or twice a year.  Routine eye exams. Ask your health care provider how often you should have your eyes checked.  Personal lifestyle choices, including:  Daily care of your teeth and gums.  Regular physical activity.  Eating a healthy diet.  Avoiding tobacco and drug use.  Limiting alcohol use.  Practicing safe sex.  Taking low doses of aspirin every day.  Taking vitamin and mineral supplements as recommended by your health care provider. What happens during an annual well check? The services and screenings done by your health care provider during your annual well check will depend on your age, overall health, lifestyle risk factors, and family history of disease. Counseling  Your  health care provider may ask you questions about your:  Alcohol use.  Tobacco use.  Drug use.  Emotional well-being.  Home and relationship well-being.  Sexual activity.  Eating habits.  History of falls.  Memory and ability to understand (cognition).  Work and work Statistician. Screening  You may have the following tests or measurements:  Height, weight, and BMI.  Blood pressure.  Lipid and cholesterol levels. These may be checked every 5 years, or more frequently if you are over 88 years old.  Skin check.  Lung cancer screening. You may have this screening every year starting at age 20 if you have a 30-pack-year history of smoking and currently smoke or have quit within the past 15 years.  Fecal occult blood test (FOBT) of the stool. You may have this test every year starting at age 61.  Flexible sigmoidoscopy or colonoscopy. You may have a sigmoidoscopy every 5 years or a colonoscopy every 10 years starting at age 64.  Prostate cancer screening. Recommendations will vary depending on your family history and other risks.  Hepatitis C blood test.  Hepatitis B blood test.  Sexually transmitted disease (STD) testing.  Diabetes screening. This is done by checking your blood sugar (glucose) after you have not eaten for a while (fasting). You may have this done every 1-3 years.  Abdominal aortic aneurysm (AAA) screening. You may need this if you are a current or former smoker.  Osteoporosis. You may be screened starting at age 34 if you are at high risk. Talk with your health care provider about your test results, treatment options, and if necessary, the need for more tests. Vaccines  Your health care  provider may recommend certain vaccines, such as:  Influenza vaccine. This is recommended every year.  Tetanus, diphtheria, and acellular pertussis (Tdap, Td) vaccine. You may need a Td booster every 10 years.  Zoster vaccine. You may need this after age  8.  Pneumococcal 13-valent conjugate (PCV13) vaccine. One dose is recommended after age 63.  Pneumococcal polysaccharide (PPSV23) vaccine. One dose is recommended after age 31. Talk to your health care provider about which screenings and vaccines you need and how often you need them. This information is not intended to replace advice given to you by your health care provider. Make sure you discuss any questions you have with your health care provider. Document Released: 12/06/2015 Document Revised: 07/29/2016 Document Reviewed: 09/10/2015 Elsevier Interactive Patient Education  2017 [place] Prevention in the Home Falls can cause injuries. They can happen to people of all ages. There are many things you can do to make your home safe and to help prevent falls. What can I do on the outside of my home?  Regularly fix the edges of walkways and driveways and fix any cracks.  Remove anything that might make you trip as you walk through a door, such as a raised step or threshold.  Trim any bushes or trees on the path to your home.  Use bright outdoor lighting.  Clear any walking paths of anything that might make someone trip, such as rocks or tools.  Regularly check to see if handrails are loose or broken. Make sure that both sides of any steps have handrails.  Any raised decks and porches should have guardrails on the edges.  Have any leaves, snow, or ice cleared regularly.  Use sand or salt on walking paths during winter.  Clean up any spills in your garage right away. This includes oil or grease spills. What can I do in the bathroom?  Use night lights.  Install grab bars by the toilet and in the tub and shower. Do not use towel bars as grab bars.  Use non-skid mats or decals in the tub or shower.  If you need to sit down in the shower, use a plastic, non-slip stool.  Keep the floor dry. Clean up any water that spills on the floor as soon as it happens.  Remove  soap buildup in the tub or shower regularly.  Attach bath mats securely with double-sided non-slip rug tape.  Do not have throw rugs and other things on the floor that can make you trip. What can I do in the bedroom?  Use night lights.  Make sure that you have a light by your bed that is easy to reach.  Do not use any sheets or blankets that are too big for your bed. They should not hang down onto the floor.  Have a firm chair that has side arms. You can use this for support while you get dressed.  Do not have throw rugs and other things on the floor that can make you trip. What can I do in the kitchen?  Clean up any spills right away.  Avoid walking on wet floors.  Keep items that you use a lot in easy-to-reach places.  If you need to reach something above you, use a strong step stool that has a grab bar.  Keep electrical cords out of the way.  Do not use floor polish or wax that makes floors slippery. If you must use wax, use non-skid floor wax.  Do not have throw  rugs and other things on the floor that can make you trip. What can I do with my stairs?  Do not leave any items on the stairs.  Make sure that there are handrails on both sides of the stairs and use them. Fix handrails that are broken or loose. Make sure that handrails are as long as the stairways.  Check any carpeting to make sure that it is firmly attached to the stairs. Fix any carpet that is loose or worn.  Avoid having throw rugs at the top or bottom of the stairs. If you do have throw rugs, attach them to the floor with carpet tape.  Make sure that you have a light switch at the top of the stairs and the bottom of the stairs. If you do not have them, ask someone to add them for you. What else can I do to help prevent falls?  Wear shoes that:  Do not have high heels.  Have rubber bottoms.  Are comfortable and fit you well.  Are closed at the toe. Do not wear sandals.  If you use a  stepladder:  Make sure that it is fully opened. Do not climb a closed stepladder.  Make sure that both sides of the stepladder are locked into place.  Ask someone to hold it for you, if possible.  Clearly mark and make sure that you can see:  Any grab bars or handrails.  First and last steps.  Where the edge of each step is.  Use tools that help you move around (mobility aids) if they are needed. These include:  Canes.  Walkers.  Scooters.  Crutches.  Turn on the lights when you go into a dark area. Replace any light bulbs as soon as they burn out.  Set up your furniture so you have a clear path. Avoid moving your furniture around.  If any of your floors are uneven, fix them.  If there are any pets around you, be aware of where they are.  Review your medicines with your doctor. Some medicines can make you feel dizzy. This can increase your chance of falling. Ask your doctor what other things that you can do to help prevent falls. This information is not intended to replace advice given to you by your health care provider. Make sure you discuss any questions you have with your health care provider. Document Released: 09/05/2009 Document Revised: 04/16/2016 Document Reviewed: 12/14/2014 Elsevier Interactive Patient Education  2017 [employer].

## 2017-11-12 NOTE — Progress Notes (Signed)
Subjective:   Christopher Huang is a 66 y.o. male who presents for Medicare Annual/Subsequent preventive examination.  Review of Systems:   Cardiac Risk Factors include: male gender;dyslipidemia;advanced age (>31men, >43 women);hypertension     Objective:    Vitals: BP 132/82 (BP Location: Left Arm, Patient Position: Sitting)   Pulse 70   Temp 97.9 F (36.6 C) (Temporal)   Resp 16   Ht 6\' 2"  (1.88 m)   Wt 232 lb 3.2 oz (105.3 kg)   BMI 29.81 kg/m   Body mass index is 29.81 kg/m.  Advanced Directives 11/12/2017 11/09/2017  Does Patient Have a Medical Advance Directive? Yes No  Type of Paramedic of [place];Living will -  Copy of [place] in Chart? No - copy requested -  Would patient like information on creating a medical advance directive? - No - Patient declined    Tobacco Social History   Tobacco Use  Smoking Status Never Smoker  Smokeless Tobacco Never Used     Counseling given: Not Answered   Clinical Intake:  Pre-visit preparation completed: Yes  Pain : No/denies pain Pain Score: 0-No pain     Nutritional Status: BMI 25 -29 Overweight Nutritional Risks: None Diabetes: No  How often do you need to have someone help you when you read instructions, pamphlets, or other written materials from your doctor or pharmacy?: 1 - Never What is the last grade level you completed in school?: some college  Interpreter Needed?: No  Information entered by :: Larwence Tu,LPN   Past Medical History:  Diagnosis Date  . Allergic rhinitis   . Cancer of prostate ([place]) 4/08   s/p surgery  . Chronic kidney disease   . Hyperlipidemia   . Hypertension   . Personal history of kidney stones    Past Surgical History:  Procedure Laterality Date  . basal skin cancers    . COLONOSCOPY WITH PROPOFOL N/A 11/09/2017   Procedure: COLONOSCOPY WITH PROPOFOL;  Surgeon: Lucilla Lame, MD;  Location[facility] ENDOSCOPY;  Service: Endoscopy;   Laterality: N/A;  . ELBOW SURGERY Right 2010  . KNEE ARTHROSCOPY    . prostate cancer removed    . TONSILLECTOMY    . TONSILLECTOMY     Family History  Problem Relation Age of Onset  . Dementia Mother   . Cancer Father 71       prostate   Social History   Socioeconomic History  . Marital status: Married    Spouse name: None  . Number of children: None  . Years of education: None  . Highest education level: None  Social Needs  . Financial resource strain: Not hard at all  . Food insecurity - worry: Never true  . Food insecurity - inability: Never true  . Transportation needs - medical: No  . Transportation needs - non-medical: No  Occupational History  . None  Tobacco Use  . Smoking status: Never Smoker  . Smokeless tobacco: Never Used  Substance and Sexual Activity  . Alcohol use: No  . Drug use: No  . Sexual activity: Yes  Other Topics Concern  . None  Social History Narrative  . None    Outpatient Encounter Medications as of 11/12/2017  Medication Sig  . cetirizine (ZYRTEC) 10 MG tablet Take 1 tablet (10 mg total) by mouth daily. (Patient taking differently: Take 10 mg by mouth as needed. )  . fluticasone (FLONASE) 50 MCG/ACT nasal spray PLACE 2 SPRAYS INTO EACH NOSTRIL  DAILY  . lisinopril (PRINIVIL,ZESTRIL) 10 MG tablet Take 1 tablet (10 mg total) by mouth daily.  . meloxicam (MOBIC) 15 MG tablet Take 1 tablet (15 mg total) by mouth daily.  . Multiple Vitamin (MULTIVITAMIN WITH MINERALS) TABS tablet Take 1 tablet by mouth daily.  . simvastatin (ZOCOR) 20 MG tablet Take 1 tablet (20 mg total) by mouth at bedtime.  . sodium chloride (OCEAN) 0.65 % SOLN nasal spray Place 2 sprays into both nostrils as needed for congestion.  Marland Kitchen aspirin EC 81 MG tablet Take 81 mg by mouth daily.  Marland Kitchen co-enzyme Q-10 30 MG capsule Take 30 mg by mouth daily.    No facility-administered encounter medications on file as of 11/12/2017.     Activities of Daily Living In your present  state of health, do you have any difficulty performing the following activities: 11/12/2017  Hearing? N  Vision? N  Difficulty concentrating or making decisions? N  Walking or climbing stairs? N  Dressing or bathing? N  Doing errands, shopping? N  Preparing Food and eating ? N  Using the Toilet? N  In the past six months, have you accidently leaked urine? N  Do you have problems with loss of bowel control? N  Managing your Medications? N  Managing your Finances? N  Housekeeping or managing your Housekeeping? N  Some recent data might be hidden    Patient Care Team: Guadalupe Maple, MD as PCP - General (Family Medicine) Roseanne Kaufman, MD as Consulting Physician (Orthopedic Surgery) Lucilla Lame, MD as Consulting Physician (Gastroenterology)   Assessment:   This is a routine wellness examination for [employer]  Exercise Activities and Dietary recommendations Current Exercise Habits: The patient does not participate in regular exercise at present, Exercise limited by: None identified  Goals    . DIET - INCREASE WATER INTAKE     Recommend drinking at least 6-8 glasses of water a day        Fall Risk Fall Risk  11/12/2017 07/15/2017 01/14/2017 11/28/2015  Falls in the past year? Yes No No No  Number falls in past yr: 1 - - -  Injury with Fall? No - - -   Is the patient's home free of loose throw rugs in walkways, pet beds, electrical cords, etc?   yes      Grab bars in the bathroom? no      Handrails on the stairs?   yes      Adequate lighting?   yes  Timed Get Up and Go Performed: completed in 6 seconds with no use of assistive devices, steady gait. No intervention needed   Depression Screen PHQ 2/9 Scores 11/12/2017 07/15/2017 01/14/2017 11/28/2015  PHQ - 2 Score 0 0 0 0    Cognitive Function     6CIT Screen 11/12/2017  What Year? 0 points  What month? 0 points  What time? 0 points  Count back from 20 0 points  Months in reverse 0 points  Repeat phrase 0 points    Total Score 0    Immunization History  Administered Date(s) Administered  . Influenza-Unspecified 11/06/2014, 09/06/2015  . Pneumococcal Conjugate-13 07/07/2016  . Pneumococcal Polysaccharide-23 11/12/2017  . Td 11/28/2015    Qualifies for Shingles Vaccine? Discussed options   Screening Tests Health Maintenance  Topic Date Due  . COLONOSCOPY  11/09/2022  . TETANUS/TDAP  11/27/2025  . INFLUENZA VACCINE  Completed  . Hepatitis C Screening  Completed  . PNA vac Low Risk Adult  Completed  Cancer Screenings: Lung: Low Dose CT Chest recommended if Age 15-80 years, 30 pack-year currently smoking OR have quit w/in 15years. Patient does not qualify. Colorectal: completed 11/09/2017  Additional Screenings:  Hepatitis B/HIV/Syphillis: not indicated Hepatitis C Screening: completed 01/14/2017    Plan:    I have personally reviewed and addressed the Medicare Annual Wellness questionnaire and have noted the following in the patient's chart:  A. Medical and social history B. Use of alcohol, tobacco or illicit drugs  C. Current medications and supplements D. Functional ability and status E.  Nutritional status F.  Physical activity G. Advance directives H. List of other physicians I.  Hospitalizations, surgeries, and ER visits in previous 12 months J.  [place] such as hearing and vision if needed, cognitive and depression L. Referrals and appointments   In addition, I have reviewed and discussed with patient certain preventive protocols, quality metrics, and best practice recommendations. A written personalized care plan for preventive services as well as general preventive health recommendations were provided to patient.   Signed,  Tyler Aas, LPN Nurse Health Advisor   Nurse Notes: none

## 2017-11-22 ENCOUNTER — Other Ambulatory Visit: Payer: Self-pay | Admitting: Family Medicine

## 2017-11-22 DIAGNOSIS — J302 Other seasonal allergic rhinitis: Secondary | ICD-10-CM

## 2017-11-22 MED ORDER — FLUTICASONE PROPIONATE 50 MCG/ACT NA SUSP: NASAL | 3 refills | Status: DC

## 2017-11-22 NOTE — Telephone Encounter (Signed)
Patient would like Dr Jeananne Rama to send in a new script for the following medication  Fluticasone prop 50 mcg  90 day supply   [facility]  Thanks

## 2017-12-28 ENCOUNTER — Encounter: Payer: Self-pay | Admitting: Family Medicine

## 2018-01-19 ENCOUNTER — Encounter: Payer: PPO | Admitting: Family Medicine

## 2018-02-15 ENCOUNTER — Encounter: Payer: Self-pay | Admitting: Family Medicine

## 2018-02-15 ENCOUNTER — Ambulatory Visit (INDEPENDENT_AMBULATORY_CARE_PROVIDER_SITE_OTHER): Payer: PPO | Admitting: Family Medicine

## 2018-02-15 VITALS — BP 138/80 | HR 73 | Ht 71.4 in | Wt 228.2 lb

## 2018-02-15 DIAGNOSIS — D2272 Melanocytic nevi of left lower limb, including hip: Secondary | ICD-10-CM | POA: Diagnosis not present

## 2018-02-15 DIAGNOSIS — C61 Malignant neoplasm of prostate: Secondary | ICD-10-CM

## 2018-02-15 DIAGNOSIS — Z1329 Encounter for screening for other suspected endocrine disorder: Secondary | ICD-10-CM

## 2018-02-15 DIAGNOSIS — E785 Hyperlipidemia, unspecified: Secondary | ICD-10-CM

## 2018-02-15 DIAGNOSIS — L821 Other seborrheic keratosis: Secondary | ICD-10-CM | POA: Diagnosis not present

## 2018-02-15 DIAGNOSIS — I10 Essential (primary) hypertension: Secondary | ICD-10-CM

## 2018-02-15 DIAGNOSIS — D225 Melanocytic nevi of trunk: Secondary | ICD-10-CM | POA: Diagnosis not present

## 2018-02-15 DIAGNOSIS — E782 Mixed hyperlipidemia: Secondary | ICD-10-CM | POA: Diagnosis not present

## 2018-02-15 DIAGNOSIS — D2261 Melanocytic nevi of right upper limb, including shoulder: Secondary | ICD-10-CM | POA: Diagnosis not present

## 2018-02-15 DIAGNOSIS — C44519 Basal cell carcinoma of skin of other part of trunk: Secondary | ICD-10-CM | POA: Diagnosis not present

## 2018-02-15 DIAGNOSIS — Z85828 Personal history of other malignant neoplasm of skin: Secondary | ICD-10-CM | POA: Diagnosis not present

## 2018-02-15 DIAGNOSIS — D485 Neoplasm of uncertain behavior of skin: Secondary | ICD-10-CM | POA: Diagnosis not present

## 2018-02-15 LAB — URINALYSIS, ROUTINE W REFLEX MICROSCOPIC
BILIRUBIN UA: NEGATIVE
GLUCOSE, UA: NEGATIVE
Leukocytes, UA: NEGATIVE
NITRITE UA: NEGATIVE
Protein, UA: NEGATIVE
RBC UA: NEGATIVE
SPEC GRAV UA: 1.02 (ref 1.005–1.030)
UUROB: 0.2 mg/dL (ref 0.2–1.0)
pH, UA: 5.5 (ref 5.0–7.5)

## 2018-02-15 MED ORDER — SIMVASTATIN 20 MG PO TABS: 20.0000 mg | ORAL_TABLET | Freq: Every day | ORAL | 4 refills | Status: DC

## 2018-02-15 MED ORDER — LISINOPRIL 10 MG PO TABS: 10.0000 mg | ORAL_TABLET | Freq: Every day | ORAL | 4 refills | Status: DC

## 2018-02-15 MED ORDER — MELOXICAM 15 MG PO TABS: 15.0000 mg | ORAL_TABLET | Freq: Every day | ORAL | 3 refills | Status: DC

## 2018-02-15 NOTE — Assessment & Plan Note (Signed)
The current medical regimen is effective;  continue present plan and medications.  

## 2018-02-15 NOTE — Progress Notes (Signed)
BP 138/80 (BP Location: Left Arm)   Pulse 73   Ht 5' 11.4" (1.814 m)   Wt 228 lb 4 oz (103.5 kg)   SpO2 97%   BMI 31.48 kg/m    Subjective:    Patient ID: Christopher Huang, male    DOB: May 23, 1951, 67 y.o.   MRN: [id number]  HPI: Christopher Huang is a 67 y.o. male  Chief Complaint  Patient presents with  . Annual Exam  Patient recheck doing well for cholesterol medications with no issues takes faithfully. Same with blood pressure medications no problems or issues. Takes meloxicam not on the weekends does well for arthritis type complaints. Takes other medications as on the list.  Relevant past medical, surgical, family and social history reviewed and updated as indicated. Interim medical history since our last visit reviewed. Allergies and medications reviewed and updated.  Review of Systems  Constitutional: Negative.   HENT: Negative.   Eyes: Negative.   Respiratory: Negative.   Cardiovascular: Negative.   Gastrointestinal: Negative.   Endocrine: Negative.   Genitourinary: Negative.   Musculoskeletal: Negative.   Skin: Negative.   Allergic/Immunologic: Negative.   Neurological: Negative.   Hematological: Negative.   Psychiatric/Behavioral: Negative.     Per HPI unless specifically indicated above     Objective:    BP 138/80 (BP Location: Left Arm)   Pulse 73   Ht 5' 11.4" (1.814 m)   Wt 228 lb 4 oz (103.5 kg)   SpO2 97%   BMI 31.48 kg/m   Wt Readings from Last 3 Encounters:  02/15/18 228 lb 4 oz (103.5 kg)  11/12/17 232 lb 3.2 oz (105.3 kg)  11/09/17 233 lb (105.7 kg)    Physical Exam  Constitutional: He is oriented to person, place, and time. He appears well-developed and well-nourished.  HENT:  Head: Normocephalic and atraumatic.  Right Ear: External ear normal.  Left Ear: External ear normal.  Eyes: Pupils are equal, round, and reactive to light. Conjunctivae and EOM are normal.  Neck: Normal range of motion. Neck supple.  Cardiovascular: Normal  rate, regular rhythm, normal heart sounds and intact distal pulses.  Pulmonary/Chest: Effort normal and breath sounds normal.  Abdominal: Soft. Bowel sounds are normal. There is no splenomegaly or hepatomegaly.  Genitourinary: Rectum normal, prostate normal and penis normal.  Musculoskeletal: Normal range of motion.  Neurological: He is alert and oriented to person, place, and time. He has normal reflexes.  Skin: No rash noted. No erythema.  Psychiatric: He has a normal mood and affect. His behavior is normal. Judgment and thought content normal.        Assessment & Plan:   Problem List Items Addressed This Visit      Cardiovascular and Mediastinum   Hypertension - Primary    The current medical regimen is effective;  continue present plan and medications.       Relevant Medications   simvastatin (ZOCOR) 20 MG tablet   lisinopril (PRINIVIL,ZESTRIL) 10 MG tablet   Other Relevant Orders   CBC with Differential/Platelet   Comprehensive metabolic panel   Urinalysis, Routine w reflex microscopic     Genitourinary   Cancer of prostate ([place])   Relevant Orders   PSA     Other   Hyperlipidemia    The current medical regimen is effective;  continue present plan and medications.       Relevant Medications   simvastatin (ZOCOR) 20 MG tablet   lisinopril (PRINIVIL,ZESTRIL) 10 MG tablet  Other Relevant Orders   CBC with Differential/Platelet   Comprehensive metabolic panel   Lipid panel   Urinalysis, Routine w reflex microscopic    Other Visit Diagnoses    Thyroid disorder screen       Relevant Orders   TSH   Urinalysis, Routine w reflex microscopic       Follow up plan: Return in about 6 months (around 08/18/2018) for BMP.

## 2018-02-16 ENCOUNTER — Encounter: Payer: Self-pay | Admitting: Family Medicine

## 2018-02-16 LAB — COMPREHENSIVE METABOLIC PANEL
ALBUMIN: 4.4 g/dL (ref 3.6–4.8)
ALT: 19 IU/L (ref 0–44)
AST: 23 IU/L (ref 0–40)
Albumin/Globulin Ratio: 2.1 (ref 1.2–2.2)
Alkaline Phosphatase: 86 IU/L (ref 39–117)
BILIRUBIN TOTAL: 0.6 mg/dL (ref 0.0–1.2)
BUN / CREAT RATIO: 22 (ref 10–24)
BUN: 18 mg/dL (ref 8–27)
CALCIUM: 9.2 mg/dL (ref 8.6–10.2)
CHLORIDE: 101 mmol/L (ref 96–106)
CO2: 25 mmol/L (ref 20–29)
CREATININE: 0.82 mg/dL (ref 0.76–1.27)
GFR, EST AFRICAN AMERICAN: 106 mL/min/{1.73_m2} (ref >59)
GFR, EST NON AFRICAN AMERICAN: 92 mL/min/{1.73_m2} (ref >59)
GLUCOSE: 88 mg/dL (ref 65–99)
Globulin, Total: 2.1 g/dL (ref 1.5–4.5)
Potassium: 4.3 mmol/L (ref 3.5–5.2)
Sodium: 141 mmol/L (ref 134–144)
TOTAL PROTEIN: 6.5 g/dL (ref 6.0–8.5)

## 2018-02-16 LAB — CBC WITH DIFFERENTIAL/PLATELET
BASOS ABS: 0.1 10*3/uL (ref 0.0–0.2)
Basos: 1 %
EOS (ABSOLUTE): 0.2 10*3/uL (ref 0.0–0.4)
Eos: 4 %
Hematocrit: 44.1 % (ref 37.5–51.0)
Hemoglobin: 15.2 g/dL (ref 13.0–17.7)
IMMATURE GRANS (ABS): 0 10*3/uL (ref 0.0–0.1)
IMMATURE GRANULOCYTES: 0 %
LYMPHS: 30 %
Lymphocytes Absolute: 1.6 10*3/uL (ref 0.7–3.1)
MCH: 30 pg (ref 26.6–33.0)
MCHC: 34.5 g/dL (ref 31.5–35.7)
MCV: 87 fL (ref 79–97)
MONOCYTES: 7 %
Monocytes Absolute: 0.4 10*3/uL (ref 0.1–0.9)
NEUTROS ABS: 3.2 10*3/uL (ref 1.4–7.0)
Neutrophils: 58 %
PLATELETS: 248 10*3/uL (ref 150–379)
RBC: 5.07 x10E6/uL (ref 4.14–5.80)
RDW: 14.4 % (ref 12.3–15.4)
WBC: 5.5 10*3/uL (ref 3.4–10.8)

## 2018-02-16 LAB — LIPID PANEL
CHOL/HDL RATIO: 4.4 ratio (ref 0.0–5.0)
Cholesterol, Total: 207 mg/dL — ABNORMAL HIGH (ref 100–199)
HDL: 47 mg/dL (ref >39)
LDL CALC: 122 mg/dL — AB (ref 0–99)
Triglycerides: 192 mg/dL — ABNORMAL HIGH (ref 0–149)
VLDL CHOLESTEROL CAL: 38 mg/dL (ref 5–40)

## 2018-02-16 LAB — TSH: TSH: 2.63 u[IU]/mL (ref 0.450–4.500)

## 2018-02-16 LAB — PSA: PROSTATE SPECIFIC AG, SERUM: 0.2 ng/mL (ref 0.0–4.0)

## 2018-03-09 DIAGNOSIS — C44519 Basal cell carcinoma of skin of other part of trunk: Secondary | ICD-10-CM | POA: Diagnosis not present

## 2018-04-27 ENCOUNTER — Encounter: Payer: Self-pay | Admitting: Emergency Medicine

## 2018-04-27 ENCOUNTER — Ambulatory Visit
Admission: EM | Admit: 2018-04-27 | Discharge: 2018-04-27 | Disposition: A | Discharge status: Home / Self Care | Payer: PPO | Attending: Family Medicine | Admitting: Family Medicine

## 2018-04-27 ENCOUNTER — Other Ambulatory Visit: Payer: Self-pay

## 2018-04-27 ENCOUNTER — Ambulatory Visit (INDEPENDENT_AMBULATORY_CARE_PROVIDER_SITE_OTHER): Payer: PPO

## 2018-04-27 ENCOUNTER — Ambulatory Visit: Payer: Self-pay | Admitting: *Deleted

## 2018-04-27 DIAGNOSIS — S6991XA Unspecified injury of right wrist, hand and finger(s), initial encounter: Secondary | ICD-10-CM | POA: Diagnosis not present

## 2018-04-27 DIAGNOSIS — S60454A Superficial foreign body of right ring finger, initial encounter: Secondary | ICD-10-CM | POA: Diagnosis not present

## 2018-04-27 IMAGING — CR DG FINGER RING 2+V*R*
3 series · 3 of 3 positions shown · non-contrast
Comparison: None.

CLINICAL DATA: Fishhook on finger today.

EXAM:
RIGHT RING FINGER 2+V

[finger ap]
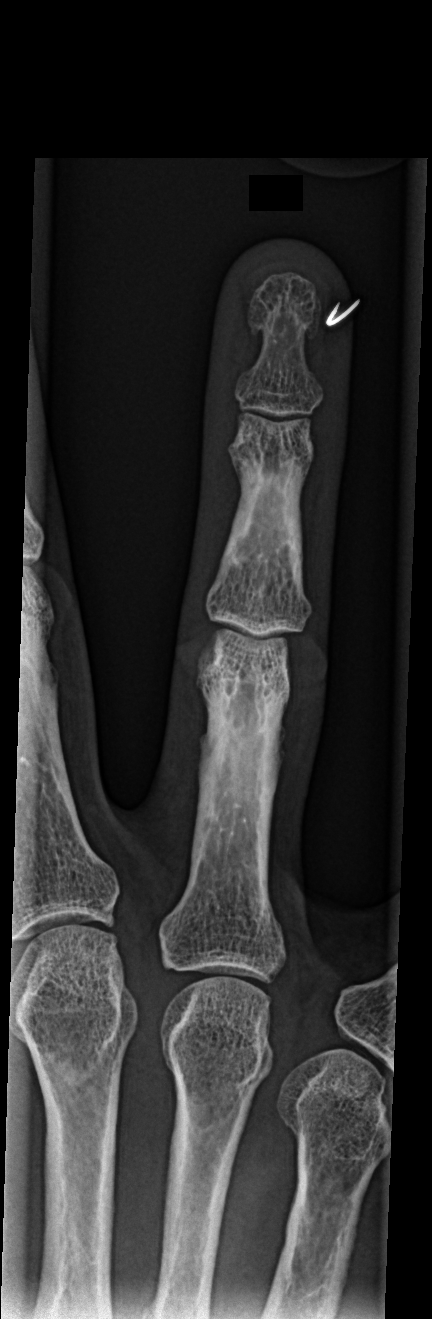

[finger obl]
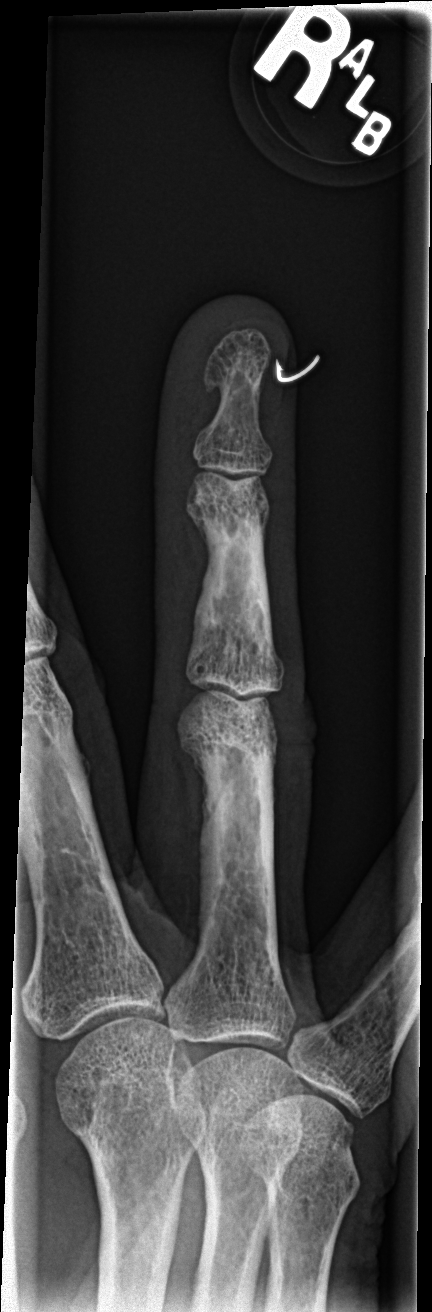

[finger lat]
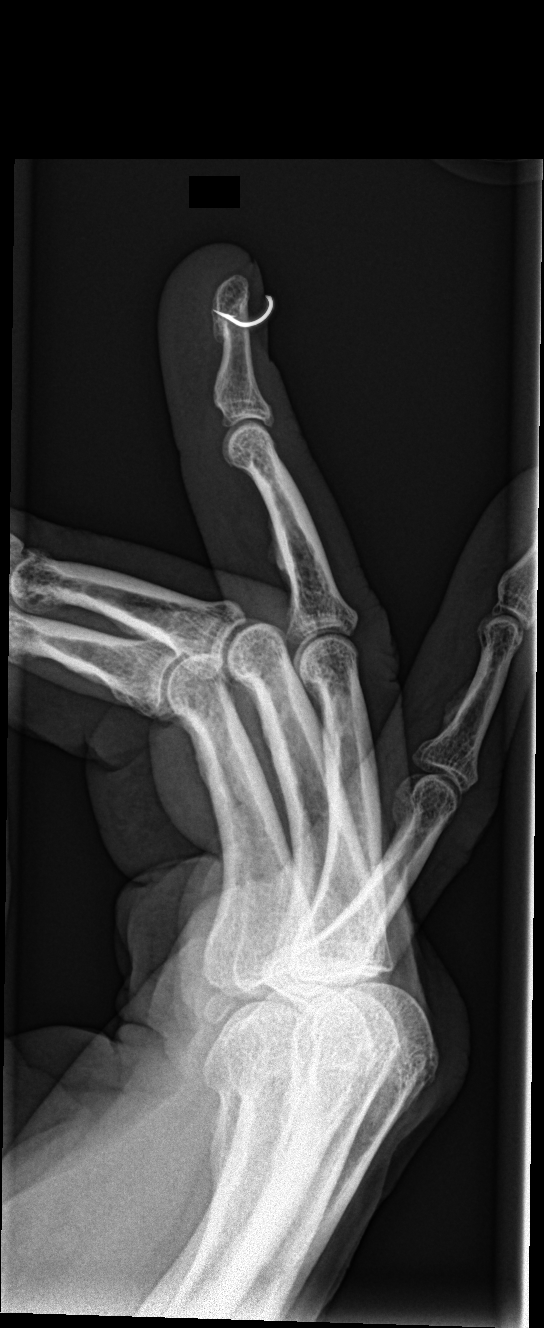

[3 of 3 positions shown; findings below may reference images not displayed]

FINDINGS: There is no evidence of fracture or dislocation. There is no
evidence of arthropathy or other focal bone abnormality. Barbed fish
hook metallic foreign body within medial aspect distal phalanx soft
tissues. No subcutaneous gas.
IMPRESSION: Barbed fish hook within medial fourth distal phalanx soft tissues.
No acute osseous process.

## 2018-04-27 MED ORDER — CEPHALEXIN 500 MG PO CAPS: 500.0000 mg | ORAL_CAPSULE | Freq: Three times a day (TID) | ORAL | 0 refills | Status: AC

## 2018-04-27 MED ORDER — BUPIVACAINE HCL (PF) 0.25 % IJ SOLN: 5.0000 mL | Freq: Once | INTRAMUSCULAR | Status: DC

## 2018-04-27 MED ORDER — MUPIROCIN 2 % EX OINT: TOPICAL_OINTMENT | CUTANEOUS | 0 refills | Status: DC

## 2018-04-27 MED ORDER — BUPIVACAINE HCL (PF) 0.5 % IJ SOLN: 5.0000 mL | Freq: Once | INTRAMUSCULAR | Status: DC

## 2018-04-27 NOTE — ED Triage Notes (Signed)
Patient in today due to having a fishing hook stuck in his right ring finger. The injury occurred today ~12:00pm.

## 2018-04-27 NOTE — Telephone Encounter (Signed)
Pt's wife called that her husband had a fish hook caught in his right ring finger beside the nail. Unable to remove it because it has a bar on the hook. He was taking the last fish off the hook and it got caught. Just returning from his fishing trip. Notified flow at [facility] and advised he go to urgent care to be treated.  Pt voiced understanding.  Reason for Disposition . [1] MODERATE-SEVERE pain AND [2] blood present under a nail  Answer Assessment - Initial Assessment Questions 1. MECHANISM: "How did the injury happen?"      Trying to take the fish off the hook 2.ust happened ONSET: "When did the injury happen?" (Minutes or hours ago)      Just happened 3. LOCATION: "What part of the finger is injured?" "Is the nail damaged?"      Ring finger on the right hand, bloody around the nail 4. APPEARANCE of the INJURY: "What does the injury look like?"      Can part of the hook and blood 5. SEVERITY: "Can you use the hand normally?"  "Can you bend your fingers into a ball and then fully open them?"     Can use his hand 6. SIZE: For cuts, bruises, or swelling, ask: "How large is it?" (e.g., inches or centimeters;  entire finger)      Not big 7. PAIN: "Is there pain?" If so, ask: "How bad is the pain?"    (e.g., Scale 1-10; or mild, moderate, severe)     yes 8. TETANUS: For any breaks in the skin, ask: "When was the last tetanus booster?"     Not sure 9. OTHER SYMPTOMS: "Do you have any other symptoms?"     no 10. PREGNANCY: "Is there any chance you are pregnant?" "When was your last menstrual period?"       no  Protocols used: FINGER INJURY-A-AH

## 2018-04-27 NOTE — Discharge Instructions (Signed)
Take medication as prescribed. Rest. Drink plenty of fluids. Keep clean as discussed.   Follow up with your primary care physician this week as needed. Return to Urgent care for new or worsening concerns.

## 2018-04-27 NOTE — ED Triage Notes (Signed)
Last tetanus 2017.

## 2018-04-27 NOTE — ED Provider Notes (Signed)
[facility] ____________________________________________  Time seen: Approximately 3:30 PM  I have reviewed the triage vital signs and the nursing notes.   HISTORY  Chief Complaint Finger Injury (right ring finger)   HPI Christopher Huang is a 67 y.o. male presenting with wife at bedside for fishhook to right fourth digit.  Patient reports that this occurred at about 12:00 this afternoon.  States that he was fishing with his brother-in-law, and in a quick attempt to remove the hook, the fish jumped and caused the  hooked to then impale lateral right fourth digit.  Reports his family tried to remove once at home unsuccessfully.  Reports last tetanus immunization was 2 years ago.  Denies decreased range of motion, pain radiation paresthesias.  Reports otherwise feels well denies other injuries.Denies recent sickness. Denies recent antibiotic use.   Guadalupe Maple, MD: PCP   Past Medical History:  Diagnosis Date  . Allergic rhinitis   . Cancer of prostate ([place]) 4/08   s/p surgery  . Chronic kidney disease   . Hyperlipidemia   . Hypertension   . Personal history of kidney stones     Patient Active Problem List   Diagnosis Date Noted  . Personal history of colonic polyps   . Benign neoplasm of cecum   . Benign neoplasm of transverse colon   . Polyp of sigmoid colon   . Allergic rhinitis   . Cancer of prostate ([place])   . Hyperlipidemia   . Hypertension   . Personal history of kidney stones     Past Surgical History:  Procedure Laterality Date  . basal skin cancers    . COLONOSCOPY WITH PROPOFOL N/A 11/09/2017   Procedure: COLONOSCOPY WITH PROPOFOL;  Surgeon: Lucilla Lame, MD;  Location: [facility] ENDOSCOPY;  Service: Endoscopy;  Laterality: N/A;  . ELBOW SURGERY Right 2010  . KNEE ARTHROSCOPY    . prostate cancer removed    . TONSILLECTOMY    . TONSILLECTOMY        Current Facility-Administered Medications:  .  bupivacaine (PF) (MARCAINE) 0.25 % injection 5  mL, 5 mL, Infiltration, Once, Marylene Land, NP  Current Outpatient Medications:  .  aspirin EC 81 MG tablet, Take 81 mg by mouth daily., Disp: , Rfl:  .  cetirizine (ZYRTEC) 10 MG tablet, Take 1 tablet (10 mg total) by mouth daily. (Patient taking differently: Take 10 mg by mouth as needed. ), Disp: 30 tablet, Rfl: 11 .  co-enzyme Q-10 30 MG capsule, Take 30 mg by mouth daily. , Disp: , Rfl:  .  fluticasone (FLONASE) 50 MCG/ACT nasal spray, PLACE 2 SPRAYS INTO EACH NOSTRIL DAILY, Disp: 48 g, Rfl: 3 .  lisinopril (PRINIVIL,ZESTRIL) 10 MG tablet, Take 1 tablet (10 mg total) by mouth daily., Disp: 90 tablet, Rfl: 4 .  meloxicam (MOBIC) 15 MG tablet, Take 1 tablet (15 mg total) by mouth daily., Disp: 90 tablet, Rfl: 3 .  Multiple Vitamin (MULTIVITAMIN WITH MINERALS) TABS tablet, Take 1 tablet by mouth daily., Disp: , Rfl:  .  simvastatin (ZOCOR) 20 MG tablet, Take 1 tablet (20 mg total) by mouth at bedtime., Disp: 90 tablet, Rfl: 4 .  cephALEXin (KEFLEX) 500 MG capsule, Take 1 capsule (500 mg total) by mouth 3 (three) times daily for 7 days., Disp: 21 capsule, Rfl: 0 .  mupirocin ointment (BACTROBAN) 2 %, Apply two times a day for 7 days., Disp: 22 g, Rfl: 0  Allergies Percocet [oxycodone-acetaminophen]; Tramadol; and Vicodin [hydrocodone-acetaminophen]  Family History  Problem Relation Age of Onset  . Dementia Mother   . Cancer Father 76       prostate    Social History Social History   Tobacco Use  . Smoking status: Never Smoker  . Smokeless tobacco: Never Used  Substance Use Topics  . Alcohol use: No  . Drug use: No    Review of Systems Constitutional: No fever/chills Cardiovascular: Denies chest pain. Respiratory: Denies shortness of breath. Gastrointestinal: No abdominal pain.   Skin: As above.    ____________________________________________   PHYSICAL EXAM:  VITAL SIGNS: ED Triage Vitals [04/27/18 1400]  Enc Vitals Group     BP 131/71     Pulse Rate 82      Resp 16     Temp 98 F (36.7 C)     Temp Source Oral     SpO2 98 %     Weight 230 lb (104.3 kg)     Height 6\' 1"  (1.854 m)     Head Circumference      Peak Flow      Pain Score 0     Pain Loc      Pain Edu?      Excl. in [place]?     Constitutional: Alert and oriented. Well appearing and in no acute distress. ENT      Head: Normocephalic and atraumatic. Cardiovascular: Normal rate, regular rhythm. Grossly normal heart sounds.  Good peripheral circulation. Respiratory: Normal respiratory effort without tachypnea nor retractions. Breath sounds are clear and equal bilaterally. No wheezes, rales, rhonchi. Musculoskeletal:Steady gait.   Neurologic:  Normal speech and language.  Speech is normal. No gait instability.  Skin:  Skin is warm, dry.  Except: Right fourth digit distal phalanx lateral nail patient hook present, mild direct tenderness, full range of motion present, no motor or tendon deficit, normal distal sensation and capillary refill, no further foreign body noted. Psychiatric: Mood and affect are normal. Speech and behavior are normal. Patient exhibits appropriate insight and judgment   ___________________________________________   LABS (all labs ordered are listed, but only abnormal results are displayed)  Labs Reviewed - No data to display ____________________________________________  RADIOLOGY  Dg Finger Ring Right  Result Date: 04/27/2018 CLINICAL DATA:  Fishhook on finger today. EXAM: RIGHT RING FINGER 2+V COMPARISON:  None. FINDINGS: There is no evidence of fracture or dislocation. There is no evidence of arthropathy or other focal bone abnormality. Barbed fish hook metallic foreign body within medial aspect distal phalanx soft tissues. No subcutaneous gas. IMPRESSION: Barbed fish hook within medial fourth distal phalanx soft tissues. No acute osseous process. Electronically Signed   By: Elon Alas M.D.   On: 04/27/2018 14:27    ____________________________________________   PROCEDURES Procedures  Procedure(s) performed:  Procedure explained and verbal consent obtained. Consent: Verbal consent obtained. Written consent not obtained. Risks and benefits: risks, benefits and alternatives were discussed Patient identity confirmed: verbally with patient and hospital-assigned identification number  Consent given by: patient   Foreign body removal. Location: Right fourth digit Foreign bodies: Fishhook Tendon involvement: none Nerve involvement: none Preparation: Patient was prepped and draped in the usual sterile fashion. Anesthesia with digital block 0.25% bupivacaine Irrigation solution: saline and Betadine Irrigation method: jet lavage Amount of cleaning: copious Foreign body removed with hemostats No repair indicated post removal Patient tolerate well.  Antibiotic ointment and dressing applied.  Wound care instructions provided.  Observe for any signs of infection or other problems.      INITIAL IMPRESSION /  ASSESSMENT AND PLAN / ED COURSE  Pertinent labs & imaging results that were available during my care of the patient were reviewed by me and considered in my medical decision making (see chart for details).  Well appearing patient.  Patient with fishhook to right fourth digit.  X-ray as above.  Removed also as above.  Patient tolerated well.  Post removal, patient viewed fish hook, and declined any concern of retained foreign body, and declined follow-up x-ray.  Will empirically place patient on oral Keflex as well as topical Bactroban.  Encouraged rest, fluids, keeping clean and supportive care.  Discussed strict follow-up and return parameters.  Discussed follow up with Primary care physician this week. Discussed follow up and return parameters including no resolution or any worsening concerns. Patient verbalized understanding and agreed to plan.    ____________________________________________   FINAL CLINICAL IMPRESSION(S) / ED DIAGNOSES  Final diagnoses:  Fish hook injury of finger of right hand, initial encounter     ED Discharge Orders        Ordered    cephALEXin (KEFLEX) 500 MG capsule  3 times daily     04/27/18 1623    mupirocin ointment (BACTROBAN) 2 %     04/27/18 1623       Note: This dictation was prepared with Dragon dictation along with smaller phrase technology. Any transcriptional errors that result from this process are unintentional.         Marylene Land, NP 04/27/18 1824

## 2018-05-24 DIAGNOSIS — H2513 Age-related nuclear cataract, bilateral: Secondary | ICD-10-CM | POA: Diagnosis not present

## 2018-05-24 DIAGNOSIS — H4389 Other disorders of vitreous body: Secondary | ICD-10-CM | POA: Diagnosis not present

## 2018-05-24 DIAGNOSIS — H35373 Puckering of macula, bilateral: Secondary | ICD-10-CM | POA: Diagnosis not present

## 2018-07-14 DIAGNOSIS — Z85828 Personal history of other malignant neoplasm of skin: Secondary | ICD-10-CM | POA: Diagnosis not present

## 2018-07-14 DIAGNOSIS — Z08 Encounter for follow-up examination after completed treatment for malignant neoplasm: Secondary | ICD-10-CM | POA: Diagnosis not present

## 2018-07-14 DIAGNOSIS — D2261 Melanocytic nevi of right upper limb, including shoulder: Secondary | ICD-10-CM | POA: Diagnosis not present

## 2018-07-14 DIAGNOSIS — D2271 Melanocytic nevi of right lower limb, including hip: Secondary | ICD-10-CM | POA: Diagnosis not present

## 2018-07-14 DIAGNOSIS — D2272 Melanocytic nevi of left lower limb, including hip: Secondary | ICD-10-CM | POA: Diagnosis not present

## 2018-07-14 DIAGNOSIS — L821 Other seborrheic keratosis: Secondary | ICD-10-CM | POA: Diagnosis not present

## 2018-07-14 DIAGNOSIS — X32XXXA Exposure to sunlight, initial encounter: Secondary | ICD-10-CM | POA: Diagnosis not present

## 2018-07-14 DIAGNOSIS — L570 Actinic keratosis: Secondary | ICD-10-CM | POA: Diagnosis not present

## 2018-07-14 DIAGNOSIS — D2262 Melanocytic nevi of left upper limb, including shoulder: Secondary | ICD-10-CM | POA: Diagnosis not present

## 2018-08-25 ENCOUNTER — Encounter: Payer: Self-pay | Admitting: Family Medicine

## 2018-08-25 ENCOUNTER — Ambulatory Visit (INDEPENDENT_AMBULATORY_CARE_PROVIDER_SITE_OTHER): Payer: PPO | Admitting: Family Medicine

## 2018-08-25 VITALS — BP 137/70 | HR 58 | Temp 97.8°F | Ht 71.4 in | Wt 221.5 lb

## 2018-08-25 DIAGNOSIS — J309 Allergic rhinitis, unspecified: Secondary | ICD-10-CM

## 2018-08-25 DIAGNOSIS — I10 Essential (primary) hypertension: Secondary | ICD-10-CM | POA: Diagnosis not present

## 2018-08-25 DIAGNOSIS — E782 Mixed hyperlipidemia: Secondary | ICD-10-CM

## 2018-08-25 NOTE — Assessment & Plan Note (Signed)
The current medical regimen is effective;  continue present plan and medications.  

## 2018-08-25 NOTE — Progress Notes (Signed)
BP 137/70 (BP Location: Left Arm, Patient Position: Sitting, Cuff Size: Normal)   Pulse (!) 58   Temp 97.8 F (36.6 C)   Ht 5' 11.4" (1.814 m)   Wt 221 lb 8 oz (100.5 kg)   SpO2 98%   BMI 30.55 kg/m    Subjective:    Patient ID: Christopher Huang, male    DOB: 1950-12-03, 67 y.o.   MRN: [id number]  HPI: Christopher Huang is a 67 y.o. male  Chief Complaint  Patient presents with  . Hypertension  Hypercholesterol Patient follow-up hypertension hypercholesterol doing well no complaints.  Taking medications without problems and good control. Taking meloxicam occasional weekend off for arthritis does well without problems.  Good control. Allergies well controlled with Flonase and Zyrtec.  Relevant past medical, surgical, family and social history reviewed and updated as indicated. Interim medical history since our last visit reviewed. Allergies and medications reviewed and updated.  Review of Systems  Constitutional: Negative.   Respiratory: Negative.   Cardiovascular: Negative.     Per HPI unless specifically indicated above     Objective:    BP 137/70 (BP Location: Left Arm, Patient Position: Sitting, Cuff Size: Normal)   Pulse (!) 58   Temp 97.8 F (36.6 C)   Ht 5' 11.4" (1.814 m)   Wt 221 lb 8 oz (100.5 kg)   SpO2 98%   BMI 30.55 kg/m   Wt Readings from Last 3 Encounters:  08/25/18 221 lb 8 oz (100.5 kg)  04/27/18 230 lb (104.3 kg)  02/15/18 228 lb 4 oz (103.5 kg)    Physical Exam  Constitutional: He is oriented to person, place, and time. He appears well-developed and well-nourished.  HENT:  Head: Normocephalic and atraumatic.  Eyes: Conjunctivae and EOM are normal.  Neck: Normal range of motion.  Cardiovascular: Normal rate, regular rhythm and normal heart sounds.  Pulmonary/Chest: Effort normal and breath sounds normal.  Musculoskeletal: Normal range of motion.  Neurological: He is alert and oriented to person, place, and time.  Skin: No erythema.    Psychiatric: He has a normal mood and affect. His behavior is normal. Judgment and thought content normal.    Results for orders placed or performed in visit on 02/15/18  CBC with Differential/Platelet  Result Value Ref Range   WBC 5.5 3.4 - 10.8 x10E3/uL   RBC 5.07 4.14 - 5.80 x10E6/uL   Hemoglobin 15.2 13.0 - 17.7 g/dL   Hematocrit 44.1 37.5 - 51.0 %   MCV 87 79 - 97 fL   MCH 30.0 26.6 - 33.0 pg   MCHC 34.5 31.5 - 35.7 g/dL   RDW 14.4 12.3 - 15.4 %   Platelets 248 150 - 379 x10E3/uL   Neutrophils 58 Not Estab. %   Lymphs 30 Not Estab. %   Monocytes 7 Not Estab. %   Eos 4 Not Estab. %   Basos 1 Not Estab. %   Neutrophils Absolute 3.2 1.4 - 7.0 x10E3/uL   Lymphocytes Absolute 1.6 0.7 - 3.1 x10E3/uL   Monocytes Absolute 0.4 0.1 - 0.9 x10E3/uL   EOS (ABSOLUTE) 0.2 0.0 - 0.4 x10E3/uL   Basophils Absolute 0.1 0.0 - 0.2 x10E3/uL   Immature Granulocytes 0 Not Estab. %   Immature Grans (Abs) 0.0 0.0 - 0.1 x10E3/uL  Comprehensive metabolic panel  Result Value Ref Range   Glucose 88 65 - 99 mg/dL   BUN 18 8 - 27 mg/dL   Creatinine, Ser 0.82 0.76 - 1.27 mg/dL  GFR calc non Af Amer 92 >59 mL/min/1.73   GFR calc Af Amer 106 >59 mL/min/1.73   BUN/Creatinine Ratio 22 10 - 24   Sodium 141 134 - 144 mmol/L   Potassium 4.3 3.5 - 5.2 mmol/L   Chloride 101 96 - 106 mmol/L   CO2 25 20 - 29 mmol/L   Calcium 9.2 8.6 - 10.2 mg/dL   Total Protein 6.5 6.0 - 8.5 g/dL   Albumin 4.4 3.6 - 4.8 g/dL   Globulin, Total 2.1 1.5 - 4.5 g/dL   Albumin/Globulin Ratio 2.1 1.2 - 2.2   Bilirubin Total 0.6 0.0 - 1.2 mg/dL   Alkaline Phosphatase 86 39 - 117 IU/L   AST 23 0 - 40 IU/L   ALT 19 0 - 44 IU/L  Lipid panel  Result Value Ref Range   Cholesterol, Total 207 (H) 100 - 199 mg/dL   Triglycerides 192 (H) 0 - 149 mg/dL   HDL 47 >39 mg/dL   VLDL Cholesterol Cal 38 5 - 40 mg/dL   LDL Calculated 122 (H) 0 - 99 mg/dL   Chol/HDL Ratio 4.4 0.0 - 5.0 ratio  PSA  Result Value Ref Range   Prostate  Specific Ag, Serum 0.2 0.0 - 4.0 ng/mL  TSH  Result Value Ref Range   TSH 2.630 0.450 - 4.500 uIU/mL  Urinalysis, Routine w reflex microscopic  Result Value Ref Range   Specific Gravity, UA 1.020 1.005 - 1.030   pH, UA 5.5 5.0 - 7.5   Color, UA Yellow Yellow   Appearance Ur Clear Clear   Leukocytes, UA Negative Negative   Protein, UA Negative Negative/Trace   Glucose, UA Negative Negative   Ketones, UA 2+ (A) Negative   RBC, UA Negative Negative   Bilirubin, UA Negative Negative   Urobilinogen, Ur 0.2 0.2 - 1.0 mg/dL   Nitrite, UA Negative Negative      Assessment & Plan:   Problem List Items Addressed This Visit      Cardiovascular and Mediastinum   Hypertension - Primary    The current medical regimen is effective;  continue present plan and medications.       Relevant Orders   Basic metabolic panel     Respiratory   Allergic rhinitis    The current medical regimen is effective;  continue present plan and medications.         Other   Hyperlipidemia    The current medical regimen is effective;  continue present plan and medications.       Relevant Orders   Lipid panel   ALT   AST       Follow up plan: Return in about 6 months (around 02/24/2019) for Physical Exam.

## 2018-08-26 LAB — BASIC METABOLIC PANEL
BUN/Creatinine Ratio: 19 (ref 10–24)
BUN: 18 mg/dL (ref 8–27)
CALCIUM: 9.1 mg/dL (ref 8.6–10.2)
CO2: 26 mmol/L (ref 20–29)
CREATININE: 0.94 mg/dL (ref 0.76–1.27)
Chloride: 103 mmol/L (ref 96–106)
GFR calc Af Amer: 97 mL/min/{1.73_m2} (ref >59)
GFR calc non Af Amer: 84 mL/min/{1.73_m2} (ref >59)
Glucose: 100 mg/dL — ABNORMAL HIGH (ref 65–99)
Potassium: 4.9 mmol/L (ref 3.5–5.2)
SODIUM: 143 mmol/L (ref 134–144)

## 2018-11-07 ENCOUNTER — Encounter: Payer: Self-pay | Admitting: Family Medicine

## 2018-11-07 ENCOUNTER — Ambulatory Visit (INDEPENDENT_AMBULATORY_CARE_PROVIDER_SITE_OTHER): Payer: PPO | Admitting: Family Medicine

## 2018-11-07 VITALS — BP 147/77 | HR 52 | Temp 98.5°F | Wt 220.5 lb

## 2018-11-07 DIAGNOSIS — R0981 Nasal congestion: Secondary | ICD-10-CM

## 2018-11-07 LAB — VERITOR FLU A/B WAIVED
Influenza A: NEGATIVE
Influenza B: NEGATIVE

## 2018-11-07 MED ORDER — PREDNISONE 50 MG PO TABS: 50.0000 mg | ORAL_TABLET | Freq: Every day | ORAL | 0 refills | Status: DC

## 2018-11-07 NOTE — Patient Instructions (Signed)

## 2018-11-07 NOTE — Progress Notes (Signed)
BP (!) 147/77   Pulse (!) 52   Temp 98.5 F (36.9 C) (Oral)   Wt 220 lb 8 oz (100 kg)   SpO2 97%   BMI 30.41 kg/m    Subjective:    Patient ID: Christopher Huang, male    DOB: 12/22/1950, 67 y.o.   MRN: [id number]  HPI: Christopher Huang is a 67 y.o. male  Chief Complaint  Patient presents with  . URI    pt states he has had a headache, sinus pressure and congestion since yesterday    UPPER RESPIRATORY TRACT INFECTION Duration: 1 day Worst symptom: headache and congestion Fever: no Cough: no Shortness of breath: no Wheezing: no Chest pain: no Chest tightness: no Chest congestion: no Nasal congestion: yes Runny nose: no Post nasal drip: no Sneezing: yes Sore throat: no Swollen glands: no Sinus pressure: yes Headache: yes Face pain: yes Toothache: no Ear pain: yes  Ear pressure: yes bilateral Eyes red/itching:no Eye drainage/crusting: no  Vomiting: no Rash: no Fatigue: yes Sick contacts: no Strep contacts: no  Context: stable Recurrent sinusitis: no Relief with OTC cold/cough medications: no  Treatments attempted: mucinex, tylenol   Relevant past medical, surgical, family and social history reviewed and updated as indicated. Interim medical history since our last visit reviewed. Allergies and medications reviewed and updated.  Review of Systems  Constitutional: Positive for fatigue. Negative for activity change, appetite change, chills, diaphoresis, fever and unexpected weight change.  HENT: Positive for congestion, postnasal drip, rhinorrhea, sinus pressure and sinus pain. Negative for dental problem, drooling, ear discharge, ear pain, facial swelling, hearing loss, mouth sores, nosebleeds and sneezing.   Eyes: Negative.   Respiratory: Negative.   Cardiovascular: Negative.   Psychiatric/Behavioral: Negative.     Per HPI unless specifically indicated above     Objective:    BP (!) 147/77   Pulse (!) 52   Temp 98.5 F (36.9 C) (Oral)   Wt 220 lb 8  oz (100 kg)   SpO2 97%   BMI 30.41 kg/m   Wt Readings from Last 3 Encounters:  11/07/18 220 lb 8 oz (100 kg)  08/25/18 221 lb 8 oz (100.5 kg)  04/27/18 230 lb (104.3 kg)    Physical Exam Vitals signs and nursing note reviewed.  Constitutional:      General: He is not in acute distress.    Appearance: Normal appearance. He is not ill-appearing, toxic-appearing or diaphoretic.  HENT:     Head: Normocephalic and atraumatic.     Right Ear: Tympanic membrane, ear canal and external ear normal. There is no impacted cerumen.     Left Ear: Tympanic membrane, ear canal and external ear normal. There is no impacted cerumen.     Nose: Congestion and rhinorrhea present.     Mouth/Throat:     Mouth: Mucous membranes are moist.     Pharynx: Oropharynx is clear. No oropharyngeal exudate or posterior oropharyngeal erythema.  Eyes:     General: No scleral icterus.       Right eye: No discharge.        Left eye: No discharge.     Extraocular Movements: Extraocular movements intact.     Conjunctiva/sclera: Conjunctivae normal.     Pupils: Pupils are equal, round, and reactive to light.  Neck:     Musculoskeletal: Normal range of motion and neck supple. No neck rigidity or muscular tenderness.     Vascular: No carotid bruit.  Cardiovascular:     Rate  and Rhythm: Normal rate and regular rhythm.     Pulses: Normal pulses.     Heart sounds: Normal heart sounds. No murmur. No friction rub. No gallop.   Pulmonary:     Effort: Pulmonary effort is normal. No respiratory distress.     Breath sounds: Normal breath sounds. No stridor. No wheezing, rhonchi or rales.  Chest:     Chest wall: No tenderness.  Musculoskeletal: Normal range of motion.  Lymphadenopathy:     Cervical: Cervical adenopathy present.  Skin:    General: Skin is warm and dry.     Capillary Refill: Capillary refill takes less than 2 seconds.     Coloration: Skin is not jaundiced or pale.     Findings: No bruising, erythema, lesion  or rash.  Neurological:     General: No focal deficit present.     Mental Status: He is alert and oriented to person, place, and time. Mental status is at baseline.     Cranial Nerves: No cranial nerve deficit.     Sensory: No sensory deficit.     Motor: No weakness.     Coordination: Coordination normal.     Gait: Gait normal.     Deep Tendon Reflexes: Reflexes normal.  Psychiatric:        Mood and Affect: Mood normal.        Behavior: Behavior normal.        Thought Content: Thought content normal.        Judgment: Judgment normal.     Results for orders placed or performed in visit on 17/61/60  Basic metabolic panel  Result Value Ref Range   Glucose 100 (H) 65 - 99 mg/dL   BUN 18 8 - 27 mg/dL   Creatinine, Ser 0.94 0.76 - 1.27 mg/dL   GFR calc non Af Amer 84 >59 mL/min/1.73   GFR calc Af Amer 97 >59 mL/min/1.73   BUN/Creatinine Ratio 19 10 - 24   Sodium 143 134 - 144 mmol/L   Potassium 4.9 3.5 - 5.2 mmol/L   Chloride 103 96 - 106 mmol/L   CO2 26 20 - 29 mmol/L   Calcium 9.1 8.6 - 10.2 mg/dL      Assessment & Plan:   Problem List Items Addressed This Visit    None    Visit Diagnoses    Congestion of nasal sinus    -  Primary   Flu negative. No sign of bacterial infection. Will treat congestion with prednisone burst. Call if not getting better or getting worse.    Relevant Orders   Veritor Flu A/B Waived       Follow up plan: Return if symptoms worsen or fail to improve.

## 2018-11-14 ENCOUNTER — Ambulatory Visit: Payer: Self-pay

## 2018-11-14 ENCOUNTER — Other Ambulatory Visit: Payer: Self-pay | Admitting: Family Medicine

## 2018-11-14 MED ORDER — PREDNISONE 50 MG PO TABS: 50.0000 mg | ORAL_TABLET | Freq: Every day | ORAL | 0 refills | Status: DC

## 2018-11-14 NOTE — Telephone Encounter (Signed)
Called and left patient a VM letting him know that a refill was sent in and that he would need to be seen if not improving.

## 2018-11-14 NOTE — Telephone Encounter (Addendum)
Sent in a refill but will need to be seen if not improving with this round or if getting worse at any point    Message from Berneta Levins sent at 11/14/2018 9:27 AM EST   Summary: additional prednisone?   Pt states that he was seen last week and given predniSONE (DELTASONE) 50 MG tablet. Pt states that he took it all and it helped, but since he has been off he is feeling the same symptoms come again. Pt wants to know if he could have an additional predniSONE (DELTASONE) 50 MG tablet script called in so he can get through the holidays feeling well. Pt uses [id number] - [place], [place] - [address] [phone] (Phone) [phone] (Fax)           Attempted to call pt to discuss symptoms but did not receive an answer.  Phone was picked up but no one responded verbally.

## 2018-11-14 NOTE — Telephone Encounter (Signed)
Call placed to patient. Phone went dead/no answer.

## 2018-11-14 NOTE — Telephone Encounter (Signed)
Copied from [place] [id number]. Topic: Quick Communication - Rx Refill/Question >> Nov 14, 2018 12:34 PM Blase Mess A wrote: Medication: predniSONE (DELTASONE) 50 MG tablet [[id number]]   Has the patient contacted their pharmacy? Yes  (Agent: If no, request that the patient contact the pharmacy for the refill.) (Agent: If yes, when and what did the pharmacy advise?)  Preferred Pharmacy (with phone number or street name): [id number] - [place], [place] [address] [phone] (Phone) [phone] (Fax)    Agent: Please be advised that RX refills may take up to 3 business days. We ask that you follow-up with your pharmacy.

## 2018-11-25 ENCOUNTER — Encounter: Payer: Self-pay | Admitting: Nurse Practitioner

## 2018-11-25 ENCOUNTER — Ambulatory Visit (INDEPENDENT_AMBULATORY_CARE_PROVIDER_SITE_OTHER): Payer: PPO | Admitting: Nurse Practitioner

## 2018-11-25 DIAGNOSIS — J0100 Acute maxillary sinusitis, unspecified: Secondary | ICD-10-CM | POA: Insufficient documentation

## 2018-11-25 DIAGNOSIS — M19049 Primary osteoarthritis, unspecified hand: Secondary | ICD-10-CM | POA: Insufficient documentation

## 2018-11-25 MED ORDER — AMOXICILLIN-POT CLAVULANATE 875-125 MG PO TABS: 1.0000 | ORAL_TABLET | Freq: Two times a day (BID) | ORAL | 0 refills | Status: AC

## 2018-11-25 NOTE — Patient Instructions (Signed)

## 2018-11-25 NOTE — Progress Notes (Signed)
BP (!) 161/68   Pulse 72   Temp 98.9 F (37.2 C) (Oral)   Ht 5' 11.4" (1.814 m)   Wt 223 lb (101.2 kg)   SpO2 97%   BMI 30.75 kg/m    Subjective:    Patient ID: [name], male    DOB: 09-Sep-1951, 68 y.o.   MRN: [id number]  HPI: TRESTAN VAHLE is a 68 y.o. male  Chief Complaint  Patient presents with  . Sinus Problem    left sided head ache, ear congestion    UPPER RESPIRATORY TRACT INFECTION Was seen on 11/07/18 for similar issues and treated with short Prednisone burst.  Report improvement with Prednisone, but "it did not last long".   Had a second burst called in and "same thing happened, worked for a short period".   Has had similar issue before several years ago, which required abx therapy to improve. Worst symptom: Sinus pressure and ear pain Fever: no Cough: no Shortness of breath: no Wheezing: no Chest pain: no Chest tightness: no Chest congestion: no Nasal congestion: yes Runny nose: yes Post nasal drip: yes Sneezing: no Sore throat: no Swollen glands: no Sinus pressure: yes Headache: yes Face pain: yes Toothache: yes Ear pain: none Ear pressure: yes bilateral Eyes red/itching:no Eye drainage/crusting: no  Vomiting: no Rash: no Fatigue: yes Sick contacts: no Strep contacts: no  Context: worse Recurrent sinusitis: no Relief with OTC cold/cough medications: no  Treatments attempted: cold/sinus   Relevant past medical, surgical, family and social history reviewed and updated as indicated. Interim medical history since our last visit reviewed. Allergies and medications reviewed and updated.  Review of Systems  Constitutional: Negative for activity change, diaphoresis, fatigue and fever.  HENT: Positive for congestion, postnasal drip, rhinorrhea, sinus pressure and sinus pain. Negative for ear discharge, ear pain, sneezing, sore throat and voice change.   Eyes: Negative for pain and visual disturbance.  Respiratory: Negative for cough, chest  tightness, shortness of breath and wheezing.   Cardiovascular: Negative for chest pain, palpitations and leg swelling.  Gastrointestinal: Negative for abdominal distention, abdominal pain, constipation, diarrhea, nausea and vomiting.  Endocrine: Negative for cold intolerance, heat intolerance, polydipsia, polyphagia and polyuria.  Musculoskeletal: Negative.   Skin: Negative.   Neurological: Negative for dizziness, syncope, weakness, light-headedness, numbness and headaches.  Psychiatric/Behavioral: Negative.     Per HPI unless specifically indicated above     Objective:    BP (!) 161/68   Pulse 72   Temp 98.9 F (37.2 C) (Oral)   Ht 5' 11.4" (1.814 m)   Wt 223 lb (101.2 kg)   SpO2 97%   BMI 30.75 kg/m   Wt Readings from Last 3 Encounters:  11/25/18 223 lb (101.2 kg)  11/07/18 220 lb 8 oz (100 kg)  08/25/18 221 lb 8 oz (100.5 kg)    Physical Exam Vitals signs and nursing note reviewed.  Constitutional:      General: He is awake.     Appearance: He is well-developed. He is not ill-appearing.  HENT:     Head: Normocephalic and atraumatic.     Comments: Mild edema around exterior eyes bilaterally    Right Ear: Hearing, ear canal and external ear normal. No drainage. A middle ear effusion is present.     Left Ear: Hearing, ear canal and external ear normal. No drainage. A middle ear effusion is present.     Nose: Mucosal edema and rhinorrhea present. Rhinorrhea is clear.     Right  Sinus: Maxillary sinus tenderness present. No frontal sinus tenderness.     Left Sinus: Maxillary sinus tenderness present. No frontal sinus tenderness.     Mouth/Throat:     Lips: Pink.     Pharynx: Oropharynx is clear. Uvula midline. No oropharyngeal exudate or posterior oropharyngeal erythema.  Eyes:     General: Lids are normal.        Right eye: No discharge.        Left eye: No discharge.     Conjunctiva/sclera: Conjunctivae normal.     Pupils: Pupils are equal, round, and reactive to  light.  Neck:     Musculoskeletal: Normal range of motion and neck supple.     Thyroid: No thyromegaly.     Vascular: No carotid bruit or JVD.     Trachea: Trachea normal.  Cardiovascular:     Rate and Rhythm: Normal rate and regular rhythm.     Heart sounds: Normal heart sounds, S1 normal and S2 normal. No murmur. No gallop.   Pulmonary:     Effort: Pulmonary effort is normal.     Breath sounds: Normal breath sounds.  Abdominal:     General: Bowel sounds are normal.     Palpations: Abdomen is soft. There is no hepatomegaly or splenomegaly.  Musculoskeletal: Normal range of motion.  Lymphadenopathy:     Head:     Right side of head: No submental, submandibular or tonsillar adenopathy.     Left side of head: No submental, submandibular or tonsillar adenopathy.     Cervical: No cervical adenopathy.  Skin:    General: Skin is warm and dry.     Capillary Refill: Capillary refill takes less than 2 seconds.     Findings: No rash.  Neurological:     Mental Status: He is alert and oriented to person, place, and time.     Deep Tendon Reflexes: Reflexes are normal and symmetric.  Psychiatric:        Mood and Affect: Mood normal.        Behavior: Behavior normal. Behavior is cooperative.        Thought Content: Thought content normal.        Judgment: Judgment normal.     Results for orders placed or performed in visit on 11/07/18  Veritor Flu A/B Waived  Result Value Ref Range   Influenza A Negative Negative   Influenza B Negative Negative      Assessment & Plan:   Problem List Items Addressed This Visit      Respiratory   Sinusitis, acute, maxillary    Due to length of time and ongoing symptoms script for Augmentin sent to pharmacy.  Recommended taking along with probiotic yogurt or tablet for GI protection.  Humidifier at home.  May take Zyrtec at home for symptom control.  Return for worsening or continued symptoms.          Follow up plan: Return if symptoms worsen  or fail to improve.

## 2018-11-25 NOTE — Assessment & Plan Note (Signed)
Due to length of time and ongoing symptoms script for Augmentin sent to pharmacy.  Recommended taking along with probiotic yogurt or tablet for GI protection.  Humidifier at home.  May take Zyrtec at home for symptom control.  Return for worsening or continued symptoms.

## 2018-12-07 DIAGNOSIS — G8929 Other chronic pain: Secondary | ICD-10-CM | POA: Diagnosis not present

## 2018-12-07 DIAGNOSIS — M25562 Pain in left knee: Secondary | ICD-10-CM | POA: Diagnosis not present

## 2018-12-07 DIAGNOSIS — M1712 Unilateral primary osteoarthritis, left knee: Secondary | ICD-10-CM | POA: Diagnosis not present

## 2018-12-14 DIAGNOSIS — M1712 Unilateral primary osteoarthritis, left knee: Secondary | ICD-10-CM | POA: Diagnosis not present

## 2019-01-18 DIAGNOSIS — D2271 Melanocytic nevi of right lower limb, including hip: Secondary | ICD-10-CM | POA: Diagnosis not present

## 2019-01-18 DIAGNOSIS — Z85828 Personal history of other malignant neoplasm of skin: Secondary | ICD-10-CM | POA: Diagnosis not present

## 2019-01-18 DIAGNOSIS — D2262 Melanocytic nevi of left upper limb, including shoulder: Secondary | ICD-10-CM | POA: Diagnosis not present

## 2019-01-18 DIAGNOSIS — Z08 Encounter for follow-up examination after completed treatment for malignant neoplasm: Secondary | ICD-10-CM | POA: Diagnosis not present

## 2019-01-18 DIAGNOSIS — L570 Actinic keratosis: Secondary | ICD-10-CM | POA: Diagnosis not present

## 2019-01-18 DIAGNOSIS — D2272 Melanocytic nevi of left lower limb, including hip: Secondary | ICD-10-CM | POA: Diagnosis not present

## 2019-01-18 DIAGNOSIS — D2261 Melanocytic nevi of right upper limb, including shoulder: Secondary | ICD-10-CM | POA: Diagnosis not present

## 2019-01-18 DIAGNOSIS — L821 Other seborrheic keratosis: Secondary | ICD-10-CM | POA: Diagnosis not present

## 2019-01-18 DIAGNOSIS — L820 Inflamed seborrheic keratosis: Secondary | ICD-10-CM | POA: Diagnosis not present

## 2019-01-18 DIAGNOSIS — D225 Melanocytic nevi of trunk: Secondary | ICD-10-CM | POA: Diagnosis not present

## 2019-01-18 DIAGNOSIS — X32XXXA Exposure to sunlight, initial encounter: Secondary | ICD-10-CM | POA: Diagnosis not present

## 2019-01-26 ENCOUNTER — Ambulatory Visit (INDEPENDENT_AMBULATORY_CARE_PROVIDER_SITE_OTHER): Payer: PPO

## 2019-01-26 VITALS — BP 130/76 | HR 66 | Temp 98.0°F | Resp 15 | Ht 72.0 in | Wt 229.8 lb

## 2019-01-26 DIAGNOSIS — R5383 Other fatigue: Secondary | ICD-10-CM

## 2019-01-26 DIAGNOSIS — E782 Mixed hyperlipidemia: Secondary | ICD-10-CM

## 2019-01-26 DIAGNOSIS — C61 Malignant neoplasm of prostate: Secondary | ICD-10-CM | POA: Diagnosis not present

## 2019-01-26 DIAGNOSIS — I10 Essential (primary) hypertension: Secondary | ICD-10-CM

## 2019-01-26 DIAGNOSIS — Z0000 Encounter for general adult medical examination without abnormal findings: Secondary | ICD-10-CM

## 2019-01-26 DIAGNOSIS — E785 Hyperlipidemia, unspecified: Secondary | ICD-10-CM | POA: Diagnosis not present

## 2019-01-26 LAB — URINALYSIS, ROUTINE W REFLEX MICROSCOPIC
Bilirubin, UA: NEGATIVE
Glucose, UA: NEGATIVE
Ketones, UA: NEGATIVE
LEUKOCYTES UA: NEGATIVE
Nitrite, UA: NEGATIVE
Protein, UA: NEGATIVE
RBC, UA: NEGATIVE
Specific Gravity, UA: 1.01 (ref 1.005–1.030)
Urobilinogen, Ur: 0.2 mg/dL (ref 0.2–1.0)
pH, UA: 5.5 (ref 5.0–7.5)

## 2019-01-26 NOTE — Progress Notes (Signed)
Subjective:   Christopher Huang is a 68 y.o. male who presents for Medicare Annual/Subsequent preventive examination.  Review of Systems:   Cardiac Risk Factors include: advanced age (>41men, >5 women);hypertension;male gender;dyslipidemia     Objective:    Vitals: BP 130/76 (BP Location: Left Arm, Patient Position: Sitting, Cuff Size: Normal)   Pulse 66   Temp 98 F (36.7 C) (Oral)   Resp 15   Ht 6' (1.829 m)   Wt 229 lb 12.8 oz (104.2 kg)   BMI 31.17 kg/m   Body mass index is 31.17 kg/m.  Advanced Directives 01/26/2019 11/12/2017 11/09/2017  Does Patient Have a Medical Advance Directive? Yes Yes No  Type of Paramedic of [place];Living will [place];Living will -  Copy of [place] in Chart? No - copy requested No - copy requested -  Would patient like information on creating a medical advance directive? - - No - Patient declined    Tobacco Social History   Tobacco Use  Smoking Status Never Smoker  Smokeless Tobacco Never Used     Counseling given: Not Answered   Clinical Intake:  Pre-visit preparation completed: Yes  Pain : 0-10 Pain Score: 1  Pain Type: Chronic pain Pain Location: Knee Pain Orientation: Left Pain Descriptors / Indicators: Aching Pain Onset: More than a month ago Pain Frequency: Constant Pain Relieving Factors: meloxicam   Pain Relieving Factors: meloxicam   Nutritional Status: BMI > 30  Obese Nutritional Risks: None Diabetes: No  How often do you need to have someone help you when you read instructions, pamphlets, or other written materials from your doctor or pharmacy?: 1 - Never What is the last grade level you completed in school?: high school, some college in service   Interpreter Needed?: No  Information entered by ::  ,LPN   Past Medical History:  Diagnosis Date  . Allergic rhinitis   . Cancer of prostate ([place]) 4/08   s/p surgery  . Chronic kidney  disease   . Hyperlipidemia   . Hypertension   . Personal history of kidney stones    Past Surgical History:  Procedure Laterality Date  . basal skin cancers    . COLONOSCOPY WITH PROPOFOL N/A 11/09/2017   Procedure: COLONOSCOPY WITH PROPOFOL;  Surgeon: Lucilla Lame, MD;  Location[facility]COPY;  Service: Endoscopy;  Laterality: N/A;  . ELBOW SURGERY Right 2010  . KNEE ARTHROSCOPY    . prostate cancer removed    . TONSILLECTOMY    . TONSILLECTOMY     Family History  Problem Relation Age of Onset  . Dementia Mother   . Cancer Father 12       prostate   Social History   Socioeconomic History  . Marital status: Married    Spouse name: Not on file  . Number of children: Not on file  . Years of education: Not on file  . Highest education level: High school graduate  Occupational History  . Occupation: retired   Scientific laboratory technician  . Financial resource strain: Not hard at all  . Food insecurity:    Worry: Never true    Inability: Never true  . Transportation needs:    Medical: No    Non-medical: No  Tobacco Use  . Smoking status: Never Smoker  . Smokeless tobacco: Never Used  Substance and Sexual Activity  . Alcohol use: No  . Drug use: No  . Sexual activity: Yes  Lifestyle  . Physical  activity:    Days per week: 0 days    Minutes per session: 0 min  . Stress: Not at all  Relationships  . Social connections:    Talks on phone: Once a week    Gets together: More than three times a week    Attends religious service: More than 4 times per year    Active member of club or organization: Yes    Attends meetings of clubs or organizations: More than 4 times per year    Relationship status: Married  Other Topics Concern  . Not on file  Social History Narrative   Goes fishing and hunting    Meets with friends every morning for breakfast    Church     Outpatient Encounter Medications as of 01/26/2019  Medication Sig  . aspirin EC 81 MG tablet Take 81 mg by mouth daily.    . cetirizine (ZYRTEC) 10 MG tablet Take 1 tablet (10 mg total) by mouth daily. (Patient taking differently: Take 10 mg by mouth as needed. )  . co-enzyme Q-10 30 MG capsule Take 30 mg by mouth daily.   . fluticasone (FLONASE) 50 MCG/ACT nasal spray PLACE 2 SPRAYS INTO EACH NOSTRIL DAILY  . lisinopril (PRINIVIL,ZESTRIL) 10 MG tablet Take 1 tablet (10 mg total) by mouth daily.  . meloxicam (MOBIC) 15 MG tablet Take 1 tablet (15 mg total) by mouth daily.  . Multiple Vitamin (MULTIVITAMIN WITH MINERALS) TABS tablet Take 1 tablet by mouth daily.  . simvastatin (ZOCOR) 20 MG tablet Take 1 tablet (20 mg total) by mouth at bedtime.   No facility-administered encounter medications on file as of 01/26/2019.     Activities of Daily Living In your present state of health, do you have any difficulty performing the following activities: 01/26/2019  Hearing? N  Vision? Y  Comment sometimes   Difficulty concentrating or making decisions? N  Walking or climbing stairs? Y  Comment knee pain   Dressing or bathing? N  Doing errands, shopping? N  Preparing Food and eating ? N  Using the Toilet? N  In the past six months, have you accidently leaked urine? N  Do you have problems with loss of bowel control? N  Managing your Medications? N  Managing your Finances? N  Housekeeping or managing your Housekeeping? N  Some recent data might be hidden    Patient Care Team: Guadalupe Maple, MD as PCP - General (Family Medicine) Roseanne Kaufman, MD as Consulting Physician (Orthopedic Surgery) Lucilla Lame, MD as Consulting Physician (Gastroenterology)   Assessment:   This is a routine wellness examination for [employer]  Exercise Activities and Dietary recommendations Current Exercise Habits: The patient does not participate in regular exercise at present, Exercise limited by: None identified  Goals    . DIET - INCREASE WATER INTAKE     Recommend drinking at least 6-8 glasses of water a day        Fall  Risk Fall Risk  01/26/2019 11/12/2017 07/15/2017 01/14/2017 11/28/2015  Falls in the past year? 0 Yes No No No  Number falls in past yr: - 1 - - -  Injury with Fall? - No - - -   FALL RISK PREVENTION PERTAINING TO THE HOME:  Any stairs in or around the home? Yes  If so, are there any without handrails? No   Home free of loose throw rugs in walkways, pet beds, electrical cords, etc? Yes  Adequate lighting in your home to reduce risk of  falls? Yes   ASSISTIVE DEVICES UTILIZED TO PREVENT FALLS:  Life alert? No  Use of a cane, walker or w/c? No  Grab bars in the bathroom? No  Shower chair or bench in shower? No  Elevated toilet seat or a handicapped toilet? No   DME ORDERS:  DME order needed?  No   TIMED UP AND GO:  Was the test performed? Yes .  Length of time to ambulate 10 feet: 10 sec.   GAIT:  Appearance of gait: Gait stead-fast without the use of an assistive device.  Education: Fall risk prevention has been discussed.  Intervention(s) required? No   Depression Screen PHQ 2/9 Scores 01/26/2019 02/15/2018 11/12/2017 07/15/2017  PHQ - 2 Score 0 0 0 0  PHQ- 9 Score - 0 - -    Cognitive Function     6CIT Screen 01/26/2019 11/12/2017  What Year? 0 points 0 points  What month? 0 points 0 points  What time? 0 points 0 points  Count back from 20 0 points 0 points  Months in reverse 0 points 0 points  Repeat phrase 0 points 0 points  Total Score 0 0    Immunization History  Administered Date(s) Administered  . Influenza, High Dose Seasonal PF 08/11/2018  . Influenza-Unspecified 11/06/2014, 09/06/2015  . Pneumococcal Conjugate-13 07/07/2016  . Pneumococcal Polysaccharide-23 11/12/2017  . Td 11/28/2015    Qualifies for Shingles Vaccine? Yes  Zostavax completed n/a. Due for Shingrix. Education has been provided regarding the importance of this vaccine. Pt has been advised to call insurance company to determine out of pocket expense. Advised may also receive vaccine at local  pharmacy or Health Dept. Verbalized acceptance and understanding.  Tdap: up to date   Flu Vaccine: up to date   Pneumococcal Vaccine: up to date  Screening Tests Health Maintenance  Topic Date Due  . COLONOSCOPY  11/09/2022  . TETANUS/TDAP  11/27/2025  . INFLUENZA VACCINE  Completed  . Hepatitis C Screening  Completed  . PNA vac Low Risk Adult  Completed   Cancer Screenings:  Colorectal Screening: Completed 11/09/2017. Repeat every 5 years   Lung Cancer Screening: (Low Dose CT Chest recommended if Age 75-80 years, 30 pack-year currently smoking OR have quit w/in 15years.) does not qualify.    Additional Screening:  Hepatitis C Screening: does qualify; Completed 01/14/2017  Vision Screening: Recommended annual ophthalmology exams for early detection of glaucoma and other disorders of the eye. Is the patient up to date with their annual eye exam?  Yes  Who is the provider or what is the name of the office in which the pt attends annual eye exams? [facility]   Dental Screening: Recommended annual dental exams for proper oral hygiene  Community Resource Referral:  CRR required this visit?  No       Plan:    I have personally reviewed and addressed the Medicare Annual Wellness questionnaire and have noted the following in the patient's chart:  A. Medical and social history B. Use of alcohol, tobacco or illicit drugs  C. Current medications and supplements D. Functional ability and status E.  Nutritional status F.  Physical activity G. Advance directives H. List of other physicians I.  Hospitalizations, surgeries, and ER visits in previous 12 months J.  [place] such as hearing and vision if needed, cognitive and depression L. Referrals and appointments   In addition, I have reviewed and discussed with patient certain preventive protocols, quality metrics, and best practice recommendations. A  written personalized care plan for preventive services as well as  general preventive health recommendations were provided to patient.   Signed,  Tyler Aas, LPN Nurse Health Advisor   Nurse Notes: none

## 2019-01-26 NOTE — Patient Instructions (Addendum)
[name] , Thank you for taking time to come for your Medicare Wellness Visit. I appreciate your ongoing commitment to your health goals. Please review the following plan we discussed and let me know if I can assist you in the future.   Screening recommendations/referrals: Colonoscopy: completed 10/30/2017  Recommended yearly ophthalmology/optometry visit for glaucoma screening and checkup Recommended yearly dental visit for hygiene and checkup  Vaccinations: Influenza vaccine: up to date  Pneumococcal vaccine: up to date  Tdap vaccine: up to date  Shingles vaccine: shingrix eligible, check with your insurance company for coverage   Advanced directives: Please bring a copy of your health care power of attorney and living will to the office at your convenience.  Conditions/risks identified: none   Next appointment: Follow up in one year for your annual wellness exam.   Preventive Care 69 Years and Older, Male Preventive care refers to lifestyle choices and visits with your health care provider that can promote health and wellness. What does preventive care include?  A yearly physical exam. This is also called an annual well check.  Dental exams once or twice a year.  Routine eye exams. Ask your health care provider how often you should have your eyes checked.  Personal lifestyle choices, including:  Daily care of your teeth and gums.  Regular physical activity.  Eating a healthy diet.  Avoiding tobacco and drug use.  Limiting alcohol use.  Practicing safe sex.  Taking low doses of aspirin every day.  Taking vitamin and mineral supplements as recommended by your health care provider. What happens during an annual well check? The services and screenings done by your health care provider during your annual well check will depend on your age, overall health, lifestyle risk factors, and family history of disease. Counseling  Your health care provider may ask you questions  about your:  Alcohol use.  Tobacco use.  Drug use.  Emotional well-being.  Home and relationship well-being.  Sexual activity.  Eating habits.  History of falls.  Memory and ability to understand (cognition).  Work and work Statistician. Screening  You may have the following tests or measurements:  Height, weight, and BMI.  Blood pressure.  Lipid and cholesterol levels. These may be checked every 5 years, or more frequently if you are over 69 years old.  Skin check.  Lung cancer screening. You may have this screening every year starting at age 69 if you have a 30-pack-year history of smoking and currently smoke or have quit within the past 15 years.  Fecal occult blood test (FOBT) of the stool. You may have this test every year starting at age 30.  Flexible sigmoidoscopy or colonoscopy. You may have a sigmoidoscopy every 5 years or a colonoscopy every 10 years starting at age 69.  Prostate cancer screening. Recommendations will vary depending on your family history and other risks.  Hepatitis C blood test.  Hepatitis B blood test.  Sexually transmitted disease (STD) testing.  Diabetes screening. This is done by checking your blood sugar (glucose) after you have not eaten for a while (fasting). You may have this done every 1-3 years.  Abdominal aortic aneurysm (AAA) screening. You may need this if you are a current or former smoker.  Osteoporosis. You may be screened starting at age 69 if you are at high risk. Talk with your health care provider about your test results, treatment options, and if necessary, the need for more tests. Vaccines  Your health care provider may recommend  certain vaccines, such as:  Influenza vaccine. This is recommended every year.  Tetanus, diphtheria, and acellular pertussis (Tdap, Td) vaccine. You may need a Td booster every 10 years.  Zoster vaccine. You may need this after age 23.  Pneumococcal 13-valent conjugate (PCV13)  vaccine. One dose is recommended after age 69.  Pneumococcal polysaccharide (PPSV23) vaccine. One dose is recommended after age 69. Talk to your health care provider about which screenings and vaccines you need and how often you need them. This information is not intended to replace advice given to you by your health care provider. Make sure you discuss any questions you have with your health care provider. Document Released: 12/06/2015 Document Revised: 07/29/2016 Document Reviewed: 09/10/2015 Elsevier Interactive Patient Education  2017 [place] Prevention in the Home Falls can cause injuries. They can happen to people of all ages. There are many things you can do to make your home safe and to help prevent falls. What can I do on the outside of my home?  Regularly fix the edges of walkways and driveways and fix any cracks.  Remove anything that might make you trip as you walk through a door, such as a raised step or threshold.  Trim any bushes or trees on the path to your home.  Use bright outdoor lighting.  Clear any walking paths of anything that might make someone trip, such as rocks or tools.  Regularly check to see if handrails are loose or broken. Make sure that both sides of any steps have handrails.  Any raised decks and porches should have guardrails on the edges.  Have any leaves, snow, or ice cleared regularly.  Use sand or salt on walking paths during winter.  Clean up any spills in your garage right away. This includes oil or grease spills. What can I do in the bathroom?  Use night lights.  Install grab bars by the toilet and in the tub and shower. Do not use towel bars as grab bars.  Use non-skid mats or decals in the tub or shower.  If you need to sit down in the shower, use a plastic, non-slip stool.  Keep the floor dry. Clean up any water that spills on the floor as soon as it happens.  Remove soap buildup in the tub or shower  regularly.  Attach bath mats securely with double-sided non-slip rug tape.  Do not have throw rugs and other things on the floor that can make you trip. What can I do in the bedroom?  Use night lights.  Make sure that you have a light by your bed that is easy to reach.  Do not use any sheets or blankets that are too big for your bed. They should not hang down onto the floor.  Have a firm chair that has side arms. You can use this for support while you get dressed.  Do not have throw rugs and other things on the floor that can make you trip. What can I do in the kitchen?  Clean up any spills right away.  Avoid walking on wet floors.  Keep items that you use a lot in easy-to-reach places.  If you need to reach something above you, use a strong step stool that has a grab bar.  Keep electrical cords out of the way.  Do not use floor polish or wax that makes floors slippery. If you must use wax, use non-skid floor wax.  Do not have throw rugs and other  things on the floor that can make you trip. What can I do with my stairs?  Do not leave any items on the stairs.  Make sure that there are handrails on both sides of the stairs and use them. Fix handrails that are broken or loose. Make sure that handrails are as long as the stairways.  Check any carpeting to make sure that it is firmly attached to the stairs. Fix any carpet that is loose or worn.  Avoid having throw rugs at the top or bottom of the stairs. If you do have throw rugs, attach them to the floor with carpet tape.  Make sure that you have a light switch at the top of the stairs and the bottom of the stairs. If you do not have them, ask someone to add them for you. What else can I do to help prevent falls?  Wear shoes that:  Do not have high heels.  Have rubber bottoms.  Are comfortable and fit you well.  Are closed at the toe. Do not wear sandals.  If you use a stepladder:  Make sure that it is fully  opened. Do not climb a closed stepladder.  Make sure that both sides of the stepladder are locked into place.  Ask someone to hold it for you, if possible.  Clearly mark and make sure that you can see:  Any grab bars or handrails.  First and last steps.  Where the edge of each step is.  Use tools that help you move around (mobility aids) if they are needed. These include:  Canes.  Walkers.  Scooters.  Crutches.  Turn on the lights when you go into a dark area. Replace any light bulbs as soon as they burn out.  Set up your furniture so you have a clear path. Avoid moving your furniture around.  If any of your floors are uneven, fix them.  If there are any pets around you, be aware of where they are.  Review your medicines with your doctor. Some medicines can make you feel dizzy. This can increase your chance of falling. Ask your doctor what other things that you can do to help prevent falls. This information is not intended to replace advice given to you by your health care provider. Make sure you discuss any questions you have with your health care provider. Document Released: 09/05/2009 Document Revised: 04/16/2016 Document Reviewed: 12/14/2014 Elsevier Interactive Patient Education  2017 [employer].

## 2019-01-27 LAB — COMPREHENSIVE METABOLIC PANEL
ALBUMIN: 3.9 g/dL (ref 3.8–4.8)
ALK PHOS: 85 IU/L (ref 39–117)
ALT: 18 IU/L (ref 0–44)
AST: 20 IU/L (ref 0–40)
Albumin/Globulin Ratio: 1.9 (ref 1.2–2.2)
BILIRUBIN TOTAL: 0.4 mg/dL (ref 0.0–1.2)
BUN / CREAT RATIO: 27 — AB (ref 10–24)
BUN: 20 mg/dL (ref 8–27)
CHLORIDE: 102 mmol/L (ref 96–106)
CO2: 24 mmol/L (ref 20–29)
CREATININE: 0.73 mg/dL — AB (ref 0.76–1.27)
Calcium: 9.1 mg/dL (ref 8.6–10.2)
GFR calc non Af Amer: 95 mL/min/{1.73_m2} (ref >59)
GFR, EST AFRICAN AMERICAN: 110 mL/min/{1.73_m2} (ref >59)
Globulin, Total: 2.1 g/dL (ref 1.5–4.5)
Glucose: 102 mg/dL — ABNORMAL HIGH (ref 65–99)
Potassium: 4.6 mmol/L (ref 3.5–5.2)
Sodium: 140 mmol/L (ref 134–144)
Total Protein: 6 g/dL (ref 6.0–8.5)

## 2019-01-27 LAB — CBC WITH DIFFERENTIAL/PLATELET
BASOS: 2 %
Basophils Absolute: 0.1 10*3/uL (ref 0.0–0.2)
EOS (ABSOLUTE): 0.2 10*3/uL (ref 0.0–0.4)
EOS: 5 %
HEMATOCRIT: 42.3 % (ref 37.5–51.0)
Hemoglobin: 14.8 g/dL (ref 13.0–17.7)
IMMATURE GRANS (ABS): 0 10*3/uL (ref 0.0–0.1)
IMMATURE GRANULOCYTES: 0 %
Lymphocytes Absolute: 1.5 10*3/uL (ref 0.7–3.1)
Lymphs: 31 %
MCH: 30.8 pg (ref 26.6–33.0)
MCHC: 35 g/dL (ref 31.5–35.7)
MCV: 88 fL (ref 79–97)
MONOS ABS: 0.4 10*3/uL (ref 0.1–0.9)
Monocytes: 9 %
NEUTROS ABS: 2.5 10*3/uL (ref 1.4–7.0)
NEUTROS PCT: 53 %
Platelets: 231 10*3/uL (ref 150–450)
RBC: 4.81 x10E6/uL (ref 4.14–5.80)
RDW: 13.5 % (ref 11.6–15.4)
WBC: 4.7 10*3/uL (ref 3.4–10.8)

## 2019-01-27 LAB — LIPID PANEL W/O CHOL/HDL RATIO
CHOLESTEROL TOTAL: 187 mg/dL (ref 100–199)
HDL: 41 mg/dL (ref >39)
LDL CALC: 85 mg/dL (ref 0–99)
Triglycerides: 307 mg/dL — ABNORMAL HIGH (ref 0–149)
VLDL CHOLESTEROL CAL: 61 mg/dL — AB (ref 5–40)

## 2019-01-27 LAB — TSH: TSH: 2.3 u[IU]/mL (ref 0.450–4.500)

## 2019-01-27 LAB — PSA: Prostate Specific Ag, Serum: 0.2 ng/mL (ref 0.0–4.0)

## 2019-01-30 ENCOUNTER — Encounter: Payer: Self-pay | Admitting: Family Medicine

## 2019-02-02 ENCOUNTER — Other Ambulatory Visit: Payer: Self-pay

## 2019-02-02 ENCOUNTER — Ambulatory Visit (INDEPENDENT_AMBULATORY_CARE_PROVIDER_SITE_OTHER): Payer: PPO | Admitting: Family Medicine

## 2019-02-02 ENCOUNTER — Encounter: Payer: Self-pay | Admitting: Family Medicine

## 2019-02-02 VITALS — BP 132/66 | HR 78 | Temp 97.8°F | Ht 72.0 in | Wt 226.4 lb

## 2019-02-02 DIAGNOSIS — Z7189 Other specified counseling: Secondary | ICD-10-CM | POA: Insufficient documentation

## 2019-02-02 DIAGNOSIS — I10 Essential (primary) hypertension: Secondary | ICD-10-CM

## 2019-02-02 DIAGNOSIS — C61 Malignant neoplasm of prostate: Secondary | ICD-10-CM

## 2019-02-02 DIAGNOSIS — E785 Hyperlipidemia, unspecified: Secondary | ICD-10-CM

## 2019-02-02 MED ORDER — MELOXICAM 15 MG PO TABS: 15.0000 mg | ORAL_TABLET | Freq: Every day | ORAL | 3 refills | Status: DC

## 2019-02-02 MED ORDER — LISINOPRIL 10 MG PO TABS: 10.0000 mg | ORAL_TABLET | Freq: Every day | ORAL | 4 refills | Status: DC

## 2019-02-02 MED ORDER — SIMVASTATIN 20 MG PO TABS: 20.0000 mg | ORAL_TABLET | Freq: Every day | ORAL | 4 refills | Status: DC

## 2019-02-02 NOTE — Assessment & Plan Note (Signed)
The current medical regimen is effective;  continue present plan and medications.  

## 2019-02-02 NOTE — Progress Notes (Signed)
BP 132/66 (BP Location: Left Arm, Patient Position: Sitting, Cuff Size: Large)   Pulse 78   Temp 97.8 F (36.6 C)   Ht 6' (1.829 m)   Wt 226 lb 6 oz (102.7 kg)   SpO2 96%   BMI 30.70 kg/m    Subjective:    Patient ID: Christopher Huang, male    DOB: 05-28-1951, 68 y.o.   MRN: [id number]  HPI: Christopher Huang is a 68 y.o. male  Chief Complaint  Patient presents with  . Annual Exam  Patient's with left knee degenerative arthritis to the point of bone-on-bone skin to be having knee replacement surgery at some point may be even this year. No prostate issues especially after removal. Blood pressure cholesterol doing well with medication and no complaints.  Relevant past medical, surgical, family and social history reviewed and updated as indicated. Interim medical history since our last visit reviewed. Allergies and medications reviewed and updated.  Review of Systems  Constitutional: Negative.   HENT: Negative.   Eyes: Negative.   Respiratory: Negative.   Cardiovascular: Negative.   Gastrointestinal: Negative.   Endocrine: Negative.   Genitourinary: Negative.   Musculoskeletal: Negative.   Skin: Negative.   Allergic/Immunologic: Negative.   Neurological: Negative.   Hematological: Negative.   Psychiatric/Behavioral: Negative.     Per HPI unless specifically indicated above     Objective:    BP 132/66 (BP Location: Left Arm, Patient Position: Sitting, Cuff Size: Large)   Pulse 78   Temp 97.8 F (36.6 C)   Ht 6' (1.829 m)   Wt 226 lb 6 oz (102.7 kg)   SpO2 96%   BMI 30.70 kg/m   Wt Readings from Last 3 Encounters:  02/02/19 226 lb 6 oz (102.7 kg)  01/26/19 229 lb 12.8 oz (104.2 kg)  11/25/18 223 lb (101.2 kg)    Physical Exam Constitutional:      Appearance: He is well-developed.  HENT:     Head: Normocephalic and atraumatic.     Right Ear: External ear normal.     Left Ear: External ear normal.  Eyes:     Conjunctiva/sclera: Conjunctivae normal.   Pupils: Pupils are equal, round, and reactive to light.  Neck:     Musculoskeletal: Normal range of motion and neck supple.  Cardiovascular:     Rate and Rhythm: Normal rate and regular rhythm.     Heart sounds: Normal heart sounds.  Pulmonary:     Effort: Pulmonary effort is normal.     Breath sounds: Normal breath sounds.  Abdominal:     General: Bowel sounds are normal.     Palpations: Abdomen is soft. There is no hepatomegaly or splenomegaly.  Genitourinary:    Penis: Normal.      Rectum: Normal.     Comments: No prostate exam Musculoskeletal: Normal range of motion.  Skin:    Findings: No erythema or rash.  Neurological:     Mental Status: He is alert and oriented to person, place, and time.     Deep Tendon Reflexes: Reflexes are normal and symmetric.  Psychiatric:        Behavior: Behavior normal.        Thought Content: Thought content normal.        Judgment: Judgment normal.     Results for orders placed or performed in visit on 01/26/19  CBC with Differential  Result Value Ref Range   WBC 4.7 3.4 - 10.8 x10E3/uL   RBC 4.81 4.14 -  5.80 x10E6/uL   Hemoglobin 14.8 13.0 - 17.7 g/dL   Hematocrit 42.3 37.5 - 51.0 %   MCV 88 79 - 97 fL   MCH 30.8 26.6 - 33.0 pg   MCHC 35.0 31.5 - 35.7 g/dL   RDW 13.5 11.6 - 15.4 %   Platelets 231 150 - 450 x10E3/uL   Neutrophils 53 Not Estab. %   Lymphs 31 Not Estab. %   Monocytes 9 Not Estab. %   Eos 5 Not Estab. %   Basos 2 Not Estab. %   Neutrophils Absolute 2.5 1.4 - 7.0 x10E3/uL   Lymphocytes Absolute 1.5 0.7 - 3.1 x10E3/uL   Monocytes Absolute 0.4 0.1 - 0.9 x10E3/uL   EOS (ABSOLUTE) 0.2 0.0 - 0.4 x10E3/uL   Basophils Absolute 0.1 0.0 - 0.2 x10E3/uL   Immature Granulocytes 0 Not Estab. %   Immature Grans (Abs) 0.0 0.0 - 0.1 x10E3/uL  Comp Met (CMET)  Result Value Ref Range   Glucose 102 (H) 65 - 99 mg/dL   BUN 20 8 - 27 mg/dL   Creatinine, Ser 0.73 (L) 0.76 - 1.27 mg/dL   GFR calc non Af Amer 95 >59 mL/min/1.73   GFR  calc Af Amer 110 >59 mL/min/1.73   BUN/Creatinine Ratio 27 (H) 10 - 24   Sodium 140 134 - 144 mmol/L   Potassium 4.6 3.5 - 5.2 mmol/L   Chloride 102 96 - 106 mmol/L   CO2 24 20 - 29 mmol/L   Calcium 9.1 8.6 - 10.2 mg/dL   Total Protein 6.0 6.0 - 8.5 g/dL   Albumin 3.9 3.8 - 4.8 g/dL   Globulin, Total 2.1 1.5 - 4.5 g/dL   Albumin/Globulin Ratio 1.9 1.2 - 2.2   Bilirubin Total 0.4 0.0 - 1.2 mg/dL   Alkaline Phosphatase 85 39 - 117 IU/L   AST 20 0 - 40 IU/L   ALT 18 0 - 44 IU/L  Lipid Panel w/o Chol/HDL Ratio  Result Value Ref Range   Cholesterol, Total 187 100 - 199 mg/dL   Triglycerides 307 (H) 0 - 149 mg/dL   HDL 41 >39 mg/dL   VLDL Cholesterol Cal 61 (H) 5 - 40 mg/dL   LDL Calculated 85 0 - 99 mg/dL  PSA  Result Value Ref Range   Prostate Specific Ag, Serum 0.2 0.0 - 4.0 ng/mL  TSH  Result Value Ref Range   TSH 2.300 0.450 - 4.500 uIU/mL  Urinalysis, Routine w reflex microscopic  Result Value Ref Range   Specific Gravity, UA 1.010 1.005 - 1.030   pH, UA 5.5 5.0 - 7.5   Color, UA Yellow Yellow   Appearance Ur Clear Clear   Leukocytes, UA Negative Negative   Protein, UA Negative Negative/Trace   Glucose, UA Negative Negative   Ketones, UA Negative Negative   RBC, UA Negative Negative   Bilirubin, UA Negative Negative   Urobilinogen, Ur 0.2 0.2 - 1.0 mg/dL   Nitrite, UA Negative Negative      Assessment & Plan:   Problem List Items Addressed This Visit      Cardiovascular and Mediastinum   Hypertension    The current medical regimen is effective;  continue present plan and medications.         Genitourinary   Cancer of prostate ([place])    stable        Other   Hyperlipidemia    The current medical regimen is effective;  continue present plan and medications.  Follow up plan: No follow-ups on file.

## 2019-02-02 NOTE — Assessment & Plan Note (Signed)
A voluntary discussion about advanced care planning including explanation and discussion of advanced directives was extentively discussed with the patient.  Explained about the healthcare proxy and living will was reviewed and packet with forms with expiration of how to fill them out was given.  Time spent: Encounter 16+ min individuals present: Patient 

## 2019-02-02 NOTE — Assessment & Plan Note (Signed)
stable °

## 2019-02-16 ENCOUNTER — Other Ambulatory Visit: Payer: Self-pay

## 2019-02-16 DIAGNOSIS — J302 Other seasonal allergic rhinitis: Secondary | ICD-10-CM

## 2019-02-16 MED ORDER — FLUTICASONE PROPIONATE 50 MCG/ACT NA SUSP: NASAL | 3 refills | Status: DC

## 2019-02-16 NOTE — Telephone Encounter (Signed)
[facility] faxed a RX request for fluticasone prop 74mcg

## 2019-02-27 ENCOUNTER — Encounter: Payer: PPO | Admitting: Family Medicine

## 2019-06-08 DIAGNOSIS — H2513 Age-related nuclear cataract, bilateral: Secondary | ICD-10-CM | POA: Diagnosis not present

## 2019-06-08 DIAGNOSIS — H35373 Puckering of macula, bilateral: Secondary | ICD-10-CM | POA: Diagnosis not present

## 2019-06-08 DIAGNOSIS — H43813 Vitreous degeneration, bilateral: Secondary | ICD-10-CM | POA: Diagnosis not present

## 2019-07-20 DIAGNOSIS — D2262 Melanocytic nevi of left upper limb, including shoulder: Secondary | ICD-10-CM | POA: Diagnosis not present

## 2019-07-20 DIAGNOSIS — X32XXXA Exposure to sunlight, initial encounter: Secondary | ICD-10-CM | POA: Diagnosis not present

## 2019-07-20 DIAGNOSIS — L570 Actinic keratosis: Secondary | ICD-10-CM | POA: Diagnosis not present

## 2019-07-20 DIAGNOSIS — L538 Other specified erythematous conditions: Secondary | ICD-10-CM | POA: Diagnosis not present

## 2019-07-20 DIAGNOSIS — D2272 Melanocytic nevi of left lower limb, including hip: Secondary | ICD-10-CM | POA: Diagnosis not present

## 2019-07-20 DIAGNOSIS — L298 Other pruritus: Secondary | ICD-10-CM | POA: Diagnosis not present

## 2019-07-20 DIAGNOSIS — D2261 Melanocytic nevi of right upper limb, including shoulder: Secondary | ICD-10-CM | POA: Diagnosis not present

## 2019-07-20 DIAGNOSIS — Z85828 Personal history of other malignant neoplasm of skin: Secondary | ICD-10-CM | POA: Diagnosis not present

## 2019-07-20 DIAGNOSIS — L820 Inflamed seborrheic keratosis: Secondary | ICD-10-CM | POA: Diagnosis not present

## 2019-07-20 DIAGNOSIS — L821 Other seborrheic keratosis: Secondary | ICD-10-CM | POA: Diagnosis not present

## 2019-08-04 ENCOUNTER — Ambulatory Visit (INDEPENDENT_AMBULATORY_CARE_PROVIDER_SITE_OTHER): Payer: PPO | Admitting: Family Medicine

## 2019-08-04 ENCOUNTER — Encounter: Payer: Self-pay | Admitting: Family Medicine

## 2019-08-04 ENCOUNTER — Other Ambulatory Visit: Payer: Self-pay

## 2019-08-04 VITALS — BP 129/78 | HR 65 | Temp 98.6°F

## 2019-08-04 DIAGNOSIS — E785 Hyperlipidemia, unspecified: Secondary | ICD-10-CM | POA: Diagnosis not present

## 2019-08-04 DIAGNOSIS — Z23 Encounter for immunization: Secondary | ICD-10-CM

## 2019-08-04 DIAGNOSIS — I10 Essential (primary) hypertension: Secondary | ICD-10-CM

## 2019-08-04 LAB — MICROALBUMIN, URINE WAIVED
Creatinine, Urine Waived: 100 mg/dL (ref 10–300)
Microalb, Ur Waived: 10 mg/L (ref 0–19)
Microalb/Creat Ratio: <30 mg/g (ref <30)

## 2019-08-04 NOTE — Addendum Note (Signed)
Addended by: Gerrit Halls L on: 08/04/2019 04:01 PM   Modules accepted: Orders

## 2019-08-04 NOTE — Assessment & Plan Note (Signed)
Under good control on current regimen. Continue current regimen. Continue to monitor. Call with any concerns. Refills given. Labs drawn.   

## 2019-08-04 NOTE — Progress Notes (Signed)
BP 129/78   Pulse 65   Temp 98.6 F (37 C)   SpO2 97%    Subjective:    Patient ID: Christopher Huang, male    DOB: 31-May-1951, 68 y.o.   MRN: [id number]  HPI: Christopher Huang is a 68 y.o. male  Chief Complaint  Patient presents with  . Hypertension  . Hyperlipidemia   HYPERTENSION / HYPERLIPIDEMIA Satisfied with current treatment? yes Duration of hypertension: chronic BP monitoring frequency: not checking BP medication side effects: no Past BP meds: lisinopril Duration of hyperlipidemia: chronic Cholesterol medication side effects: no Cholesterol supplements: none Past cholesterol medications: simvastatin Medication compliance: excellent compliance Aspirin: yes Recent stressors: no Recurrent headaches: no Visual changes: no Palpitations: no Dyspnea: no Chest pain: no Lower extremity edema: no Dizzy/lightheaded: no  Relevant past medical, surgical, family and social history reviewed and updated as indicated. Interim medical history since our last visit reviewed. Allergies and medications reviewed and updated.  Review of Systems  Constitutional: Negative.   Respiratory: Negative.   Cardiovascular: Negative.   Musculoskeletal: Positive for arthralgias. Negative for back pain, gait problem, joint swelling, myalgias, neck pain and neck stiffness.  Skin: Negative.   Psychiatric/Behavioral: Negative.     Per HPI unless specifically indicated above     Objective:    BP 129/78   Pulse 65   Temp 98.6 F (37 C)   SpO2 97%   Wt Readings from Last 3 Encounters:  02/02/19 226 lb 6 oz (102.7 kg)  01/26/19 229 lb 12.8 oz (104.2 kg)  11/25/18 223 lb (101.2 kg)    Physical Exam Vitals signs and nursing note reviewed.  Constitutional:      General: He is not in acute distress.    Appearance: Normal appearance. He is not ill-appearing, toxic-appearing or diaphoretic.  HENT:     Head: Normocephalic and atraumatic.     Right Ear: External ear normal.     Left Ear:  External ear normal.     Nose: Nose normal.     Mouth/Throat:     Mouth: Mucous membranes are moist.     Pharynx: Oropharynx is clear.  Eyes:     General: No scleral icterus.       Right eye: No discharge.        Left eye: No discharge.     Extraocular Movements: Extraocular movements intact.     Conjunctiva/sclera: Conjunctivae normal.     Pupils: Pupils are equal, round, and reactive to light.  Neck:     Musculoskeletal: Normal range of motion and neck supple.  Cardiovascular:     Rate and Rhythm: Normal rate and regular rhythm.     Pulses: Normal pulses.     Heart sounds: Normal heart sounds. No murmur. No friction rub. No gallop.   Pulmonary:     Effort: Pulmonary effort is normal. No respiratory distress.     Breath sounds: Normal breath sounds. No stridor. No wheezing, rhonchi or rales.  Chest:     Chest wall: No tenderness.  Musculoskeletal: Normal range of motion.  Skin:    General: Skin is warm and dry.     Capillary Refill: Capillary refill takes less than 2 seconds.     Coloration: Skin is not jaundiced or pale.     Findings: No bruising, erythema, lesion or rash.  Neurological:     General: No focal deficit present.     Mental Status: He is alert and oriented to person, place, and time. Mental  status is at baseline.  Psychiatric:        Mood and Affect: Mood normal.        Behavior: Behavior normal.        Thought Content: Thought content normal.        Judgment: Judgment normal.     Results for orders placed or performed in visit on 01/26/19  CBC with Differential  Result Value Ref Range   WBC 4.7 3.4 - 10.8 x10E3/uL   RBC 4.81 4.14 - 5.80 x10E6/uL   Hemoglobin 14.8 13.0 - 17.7 g/dL   Hematocrit 42.3 37.5 - 51.0 %   MCV 88 79 - 97 fL   MCH 30.8 26.6 - 33.0 pg   MCHC 35.0 31.5 - 35.7 g/dL   RDW 13.5 11.6 - 15.4 %   Platelets 231 150 - 450 x10E3/uL   Neutrophils 53 Not Estab. %   Lymphs 31 Not Estab. %   Monocytes 9 Not Estab. %   Eos 5 Not Estab. %    Basos 2 Not Estab. %   Neutrophils Absolute 2.5 1.4 - 7.0 x10E3/uL   Lymphocytes Absolute 1.5 0.7 - 3.1 x10E3/uL   Monocytes Absolute 0.4 0.1 - 0.9 x10E3/uL   EOS (ABSOLUTE) 0.2 0.0 - 0.4 x10E3/uL   Basophils Absolute 0.1 0.0 - 0.2 x10E3/uL   Immature Granulocytes 0 Not Estab. %   Immature Grans (Abs) 0.0 0.0 - 0.1 x10E3/uL  Comp Met (CMET)  Result Value Ref Range   Glucose 102 (H) 65 - 99 mg/dL   BUN 20 8 - 27 mg/dL   Creatinine, Ser 0.73 (L) 0.76 - 1.27 mg/dL   GFR calc non Af Amer 95 >59 mL/min/1.73   GFR calc Af Amer 110 >59 mL/min/1.73   BUN/Creatinine Ratio 27 (H) 10 - 24   Sodium 140 134 - 144 mmol/L   Potassium 4.6 3.5 - 5.2 mmol/L   Chloride 102 96 - 106 mmol/L   CO2 24 20 - 29 mmol/L   Calcium 9.1 8.6 - 10.2 mg/dL   Total Protein 6.0 6.0 - 8.5 g/dL   Albumin 3.9 3.8 - 4.8 g/dL   Globulin, Total 2.1 1.5 - 4.5 g/dL   Albumin/Globulin Ratio 1.9 1.2 - 2.2   Bilirubin Total 0.4 0.0 - 1.2 mg/dL   Alkaline Phosphatase 85 39 - 117 IU/L   AST 20 0 - 40 IU/L   ALT 18 0 - 44 IU/L  Lipid Panel w/o Chol/HDL Ratio  Result Value Ref Range   Cholesterol, Total 187 100 - 199 mg/dL   Triglycerides 307 (H) 0 - 149 mg/dL   HDL 41 >39 mg/dL   VLDL Cholesterol Cal 61 (H) 5 - 40 mg/dL   LDL Calculated 85 0 - 99 mg/dL  PSA  Result Value Ref Range   Prostate Specific Ag, Serum 0.2 0.0 - 4.0 ng/mL  TSH  Result Value Ref Range   TSH 2.300 0.450 - 4.500 uIU/mL  Urinalysis, Routine w reflex microscopic  Result Value Ref Range   Specific Gravity, UA 1.010 1.005 - 1.030   pH, UA 5.5 5.0 - 7.5   Color, UA Yellow Yellow   Appearance Ur Clear Clear   Leukocytes, UA Negative Negative   Protein, UA Negative Negative/Trace   Glucose, UA Negative Negative   Ketones, UA Negative Negative   RBC, UA Negative Negative   Bilirubin, UA Negative Negative   Urobilinogen, Ur 0.2 0.2 - 1.0 mg/dL   Nitrite, UA Negative Negative  Assessment & Plan:   Problem List Items Addressed This Visit       Cardiovascular and Mediastinum   Hypertension - Primary    Under good control on current regimen. Continue current regimen. Continue to monitor. Call with any concerns. Refills given. Labs drawn.        Relevant Orders   Microalbumin, Urine Waived   Lipid Panel w/o Chol/HDL Ratio   Comprehensive metabolic panel     Other   Hyperlipidemia    Under good control on current regimen. Continue current regimen. Continue to monitor. Call with any concerns. Refills given. Labs drawn.        Relevant Orders   Lipid Panel w/o Chol/HDL Ratio   Comprehensive metabolic panel       Follow up plan: Return in about 6 months (around 02/01/2020) for Wellness/physical.

## 2019-08-05 LAB — COMPREHENSIVE METABOLIC PANEL
ALT: 17 IU/L (ref 0–44)
AST: 18 IU/L (ref 0–40)
Albumin/Globulin Ratio: 2.2 (ref 1.2–2.2)
Albumin: 4.3 g/dL (ref 3.8–4.8)
Alkaline Phosphatase: 102 IU/L (ref 39–117)
BUN/Creatinine Ratio: 21 (ref 10–24)
BUN: 17 mg/dL (ref 8–27)
Bilirubin Total: 0.3 mg/dL (ref 0.0–1.2)
CO2: 27 mmol/L (ref 20–29)
Calcium: 9.3 mg/dL (ref 8.6–10.2)
Chloride: 103 mmol/L (ref 96–106)
Creatinine, Ser: 0.82 mg/dL (ref 0.76–1.27)
GFR calc Af Amer: 105 mL/min/{1.73_m2} (ref >59)
GFR calc non Af Amer: 91 mL/min/{1.73_m2} (ref >59)
Globulin, Total: 2 g/dL (ref 1.5–4.5)
Glucose: 95 mg/dL (ref 65–99)
Potassium: 4.9 mmol/L (ref 3.5–5.2)
Sodium: 141 mmol/L (ref 134–144)
Total Protein: 6.3 g/dL (ref 6.0–8.5)

## 2019-08-05 LAB — LIPID PANEL W/O CHOL/HDL RATIO
Cholesterol, Total: 176 mg/dL (ref 100–199)
HDL: 44 mg/dL (ref >39)
LDL Chol Calc (NIH): 88 mg/dL (ref 0–99)
Triglycerides: 263 mg/dL — ABNORMAL HIGH (ref 0–149)
VLDL Cholesterol Cal: 44 mg/dL — ABNORMAL HIGH (ref 5–40)

## 2019-08-06 ENCOUNTER — Encounter: Payer: Self-pay | Admitting: Family Medicine

## 2019-08-07 ENCOUNTER — Ambulatory Visit: Payer: PPO | Admitting: Family Medicine

## 2019-08-08 ENCOUNTER — Ambulatory Visit: Payer: PPO

## 2019-08-11 ENCOUNTER — Other Ambulatory Visit: Payer: Self-pay | Admitting: Orthopedic Surgery

## 2019-08-11 DIAGNOSIS — M1712 Unilateral primary osteoarthritis, left knee: Secondary | ICD-10-CM

## 2019-08-17 ENCOUNTER — Ambulatory Visit
Admission: RE | Admit: 2019-08-17 | Discharge: 2019-08-17 | Disposition: A | Discharge status: Home / Self Care | Payer: PPO | Source: Ambulatory Visit | Attending: Orthopedic Surgery | Admitting: Orthopedic Surgery

## 2019-08-17 ENCOUNTER — Other Ambulatory Visit: Payer: Self-pay

## 2019-08-17 DIAGNOSIS — M1712 Unilateral primary osteoarthritis, left knee: Secondary | ICD-10-CM

## 2019-08-17 IMAGING — CT CT KNEE*L* W/O CM
1 of 3 series · 7 of 14 positions shown, 9 images · non-contrast
Comparison: None.

CLINICAL DATA: Left knee pain for years

EXAM:
CT OF THE LEFT KNEE WITHOUT CONTRAST
TECHNIQUE: Multidetector CT imaging of the LEFT knee was performed according to
the standard protocol. Multiplanar CT image reconstructions were
also generated.

[Series 12: axial st · axial · 0.37mm/px · z∈[+419,+659]mm · 7 of 320 slices shown, 9 images]
[im 40/320  soft-tissue]
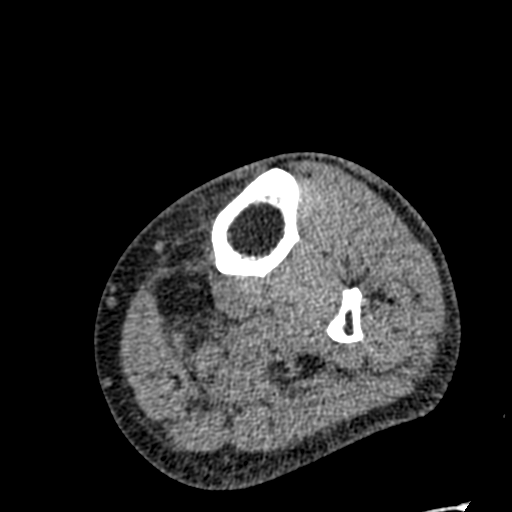
[im 40/320  bone]
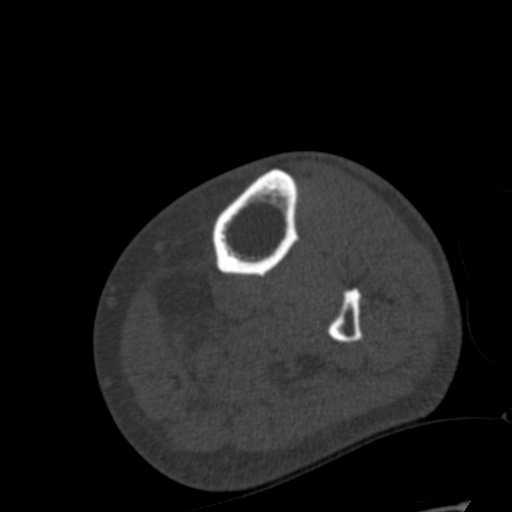
[im 80/320  bone]
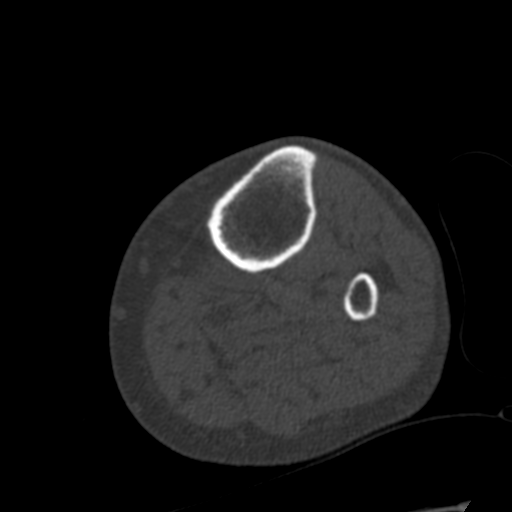
[im 120/320  bone]
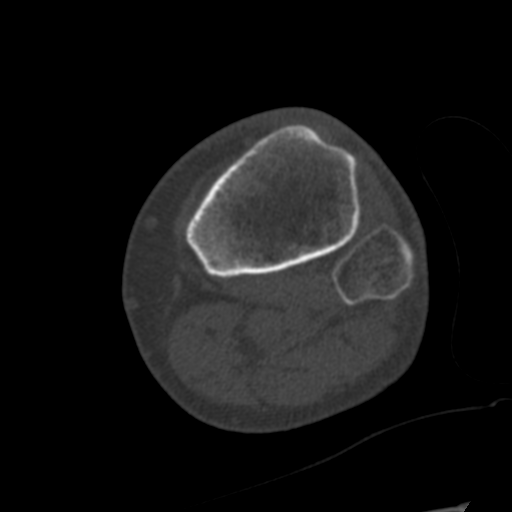
[im 160/320  bone]
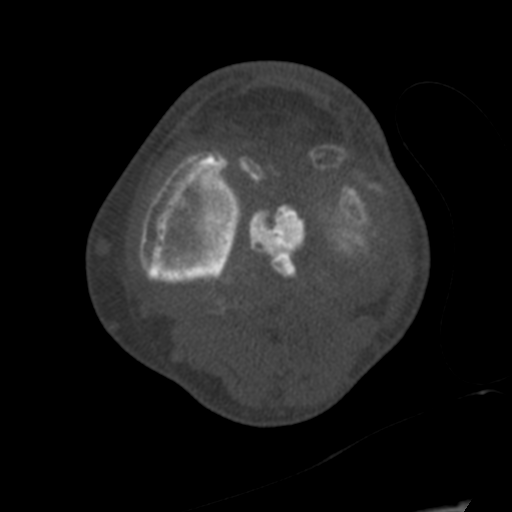
[im 200/320  soft-tissue]
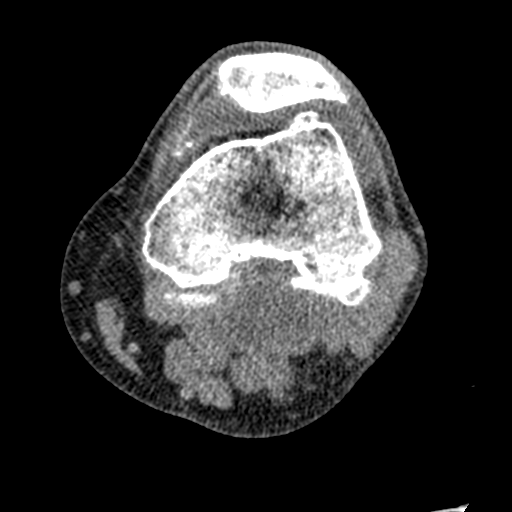
[im 200/320  bone]
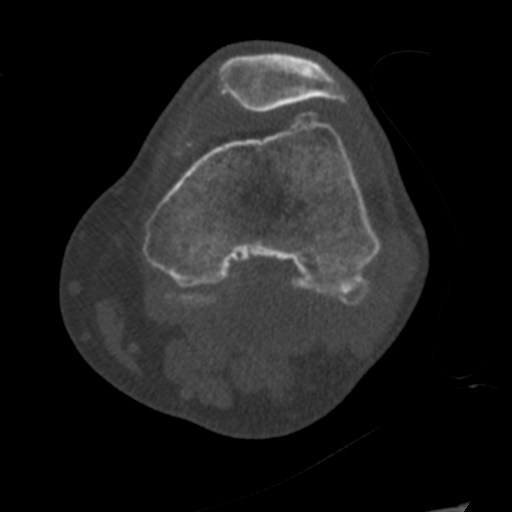
[im 240/320  bone]
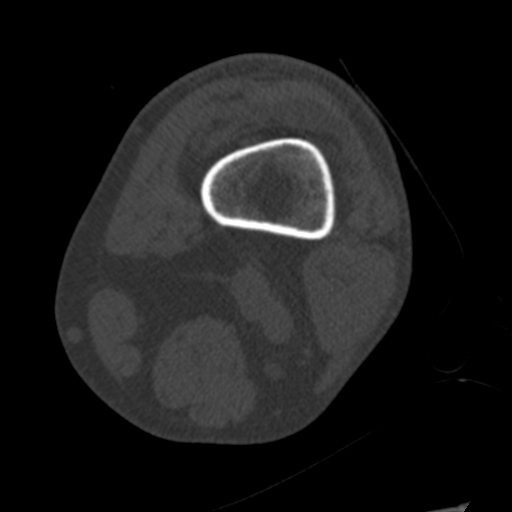
[im 280/320  bone]
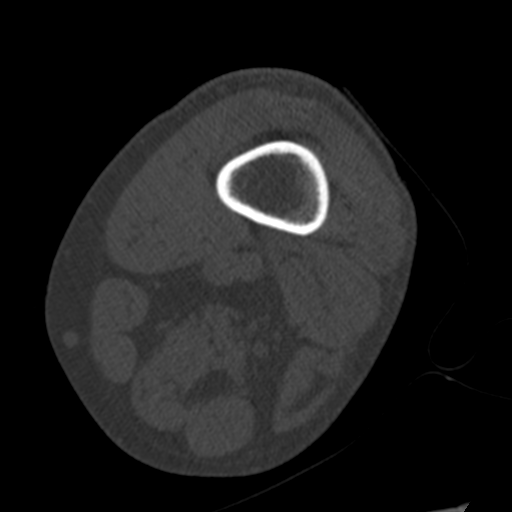

[7 of 14 positions shown; findings below may reference images not displayed]

FINDINGS: Bones/Joint/Cartilage

No fracture or dislocation. Normal alignment.

No aggressive osseous lesion. No periosteal reaction or bone
destruction. Moderate tricompartmental joint space narrowing with
subchondral sclerosis and marginal osteophytes most consistent with
osteoarthritis most severe in the medial femorotibial compartment.
Small joint effusion. Moderate-sized Baker's cyst.

Mild osteoarthritis of the left sacroiliac joint. Left hip joint
space is maintained. No acute osseous abnormality of the left hip.

No acute osseous abnormality of the ankle. Mild osteoarthritis of
the talonavicular joint. Moderate osteoarthritis of the
navicular-cuneiform joint. Severe osteoarthritis of the second
tarsometatarsal joint. Mild osteoarthritis of the first
tarsometatarsal joint.

Ligaments

Ligaments are suboptimally evaluated by CT.

Muscles and Tendons
Muscles are normal.  No muscle atrophy.

Soft tissue
No fluid collection or hematoma.  No soft tissue mass.
IMPRESSION: 1. Tricompartmental osteoarthritis of left knee.

## 2019-10-10 ENCOUNTER — Telehealth: Payer: Self-pay

## 2019-10-10 ENCOUNTER — Other Ambulatory Visit: Payer: Self-pay | Admitting: Orthopedic Surgery

## 2019-10-10 NOTE — Telephone Encounter (Signed)
Patient dropped off an updated medication list. Med list updated and placed letter in chart to be scanned.

## 2019-10-11 NOTE — Telephone Encounter (Signed)
Pt called to confirm you did receive the med list.

## 2019-10-24 ENCOUNTER — Other Ambulatory Visit: Admission: RE | Admit: 2019-10-24 | Payer: PPO | Source: Ambulatory Visit

## 2019-10-27 ENCOUNTER — Other Ambulatory Visit: Admission: RE | Admit: 2019-10-27 | Payer: PPO | Source: Ambulatory Visit

## 2019-10-31 ENCOUNTER — Encounter: Admission: RE | Payer: Self-pay | Source: Home / Self Care

## 2019-10-31 ENCOUNTER — Inpatient Hospital Stay: Admission: RE | Admit: 2019-10-31 | Payer: PPO | Source: Home / Self Care | Admitting: Orthopedic Surgery

## 2019-10-31 SURGERY — ARTHROPLASTY, KNEE, TOTAL
Anesthesia: Choice | Site: Knee | Laterality: Left

## 2019-11-30 DIAGNOSIS — H35373 Puckering of macula, bilateral: Secondary | ICD-10-CM | POA: Diagnosis not present

## 2019-11-30 DIAGNOSIS — H43813 Vitreous degeneration, bilateral: Secondary | ICD-10-CM | POA: Diagnosis not present

## 2019-11-30 DIAGNOSIS — H2513 Age-related nuclear cataract, bilateral: Secondary | ICD-10-CM | POA: Diagnosis not present

## 2019-12-30 DIAGNOSIS — K1370 Unspecified lesions of oral mucosa: Secondary | ICD-10-CM | POA: Diagnosis not present

## 2020-01-12 ENCOUNTER — Other Ambulatory Visit: Payer: Self-pay | Admitting: Orthopedic Surgery

## 2020-01-15 DIAGNOSIS — H2513 Age-related nuclear cataract, bilateral: Secondary | ICD-10-CM | POA: Diagnosis not present

## 2020-01-15 DIAGNOSIS — H35033 Hypertensive retinopathy, bilateral: Secondary | ICD-10-CM | POA: Diagnosis not present

## 2020-01-15 DIAGNOSIS — H25013 Cortical age-related cataract, bilateral: Secondary | ICD-10-CM | POA: Diagnosis not present

## 2020-01-15 DIAGNOSIS — H3554 Dystrophies primarily involving the retinal pigment epithelium: Secondary | ICD-10-CM | POA: Diagnosis not present

## 2020-01-15 DIAGNOSIS — H2512 Age-related nuclear cataract, left eye: Secondary | ICD-10-CM | POA: Diagnosis not present

## 2020-01-15 DIAGNOSIS — H35373 Puckering of macula, bilateral: Secondary | ICD-10-CM | POA: Diagnosis not present

## 2020-01-17 DIAGNOSIS — K0510 Chronic gingivitis, plaque induced: Secondary | ICD-10-CM | POA: Diagnosis not present

## 2020-01-18 ENCOUNTER — Other Ambulatory Visit: Payer: PPO

## 2020-01-22 ENCOUNTER — Other Ambulatory Visit: Payer: PPO

## 2020-01-23 ENCOUNTER — Other Ambulatory Visit: Payer: PPO

## 2020-01-23 DIAGNOSIS — X32XXXA Exposure to sunlight, initial encounter: Secondary | ICD-10-CM | POA: Diagnosis not present

## 2020-01-23 DIAGNOSIS — L821 Other seborrheic keratosis: Secondary | ICD-10-CM | POA: Diagnosis not present

## 2020-01-23 DIAGNOSIS — D2272 Melanocytic nevi of left lower limb, including hip: Secondary | ICD-10-CM | POA: Diagnosis not present

## 2020-01-23 DIAGNOSIS — L820 Inflamed seborrheic keratosis: Secondary | ICD-10-CM | POA: Diagnosis not present

## 2020-01-23 DIAGNOSIS — Z85828 Personal history of other malignant neoplasm of skin: Secondary | ICD-10-CM | POA: Diagnosis not present

## 2020-01-23 DIAGNOSIS — L570 Actinic keratosis: Secondary | ICD-10-CM | POA: Diagnosis not present

## 2020-01-23 DIAGNOSIS — D2261 Melanocytic nevi of right upper limb, including shoulder: Secondary | ICD-10-CM | POA: Diagnosis not present

## 2020-01-23 DIAGNOSIS — D2262 Melanocytic nevi of left upper limb, including shoulder: Secondary | ICD-10-CM | POA: Diagnosis not present

## 2020-01-23 DIAGNOSIS — L538 Other specified erythematous conditions: Secondary | ICD-10-CM | POA: Diagnosis not present

## 2020-01-25 ENCOUNTER — Inpatient Hospital Stay: Admit: 2020-01-25 | Payer: PPO | Admitting: Orthopedic Surgery

## 2020-01-25 SURGERY — ARTHROPLASTY, KNEE, TOTAL
Anesthesia: Choice | Site: Knee | Laterality: Left

## 2020-01-29 ENCOUNTER — Ambulatory Visit (INDEPENDENT_AMBULATORY_CARE_PROVIDER_SITE_OTHER): Payer: PPO

## 2020-01-29 VITALS — BP 138/75 | Ht 72.0 in | Wt 219.0 lb

## 2020-01-29 DIAGNOSIS — Z0000 Encounter for general adult medical examination without abnormal findings: Secondary | ICD-10-CM | POA: Diagnosis not present

## 2020-01-29 NOTE — Progress Notes (Signed)
Subjective:   Christopher Huang is a 69 y.o. male who presents for Medicare Annual/Subsequent preventive examination.  This visit is being conducted via phone call  - after an attmept to do on video chat - due to the COVID-19 pandemic. This patient has given me verbal consent via phone to conduct this visit, patient states they are participating from their home address. Some vital signs may be absent or patient reported.   Patient identification: identified by name, DOB, and current address.    Review of Systems:   Cardiac Risk Factors include: advanced age (>59men, >60 women);male gender;dyslipidemia;hypertension     Objective:    Vitals: BP 138/75   Ht 6' (1.829 m)   Wt 219 lb (99.3 kg)   BMI 29.70 kg/m   Body mass index is 29.7 kg/m.  Advanced Directives 01/29/2020 01/26/2019 11/12/2017 11/09/2017  Does Patient Have a Medical Advance Directive? Yes Yes Yes No  Type of Advance Directive Living will;Healthcare Power of [place];Living will [facility];Living will -  Copy of [place] in Chart? No - copy requested No - copy requested No - copy requested -  Would patient like information on creating a medical advance directive? - - - No - Patient declined    Tobacco Social History   Tobacco Use  Smoking Status Never Smoker  Smokeless Tobacco Never Used     Counseling given: Not Answered   Clinical Intake:  Pre-visit preparation completed: Yes  Pain : 0-10 Pain Score: 8  Pain Type: Chronic pain Pain Location: Knee Pain Orientation: Left Pain Descriptors / Indicators: Aching Pain Onset: More than a month ago Pain Frequency: Constant     Nutritional Status: BMI 25 -29 Overweight Nutritional Risks: None Diabetes: No  How often do you need to have someone help you when you read instructions, pamphlets, or other written materials from your doctor or pharmacy?: 1 - Never  Interpreter Needed?:  No  Information entered by :: Tiffan Patsy Zaragoza,LPN  Past Medical History:  Diagnosis Date  . Allergic rhinitis   . Cancer of prostate ([place]) 4/08   s/p surgery  . Chronic kidney disease   . Hyperlipidemia   . Hypertension   . Personal history of kidney stones    Past Surgical History:  Procedure Laterality Date  . basal skin cancers    . COLONOSCOPY WITH PROPOFOL N/A 11/09/2017   Procedure: COLONOSCOPY WITH PROPOFOL;  Surgeon: Lucilla Lame, MD;  Location: [facility] ENDOSCOPY;  Service: Endoscopy;  Laterality: N/A;  . ELBOW SURGERY Right 2010  . KNEE ARTHROSCOPY    . prostate cancer removed    . TONSILLECTOMY    . TONSILLECTOMY     Family History  Problem Relation Age of Onset  . Dementia Mother   . Prostate cancer Father    Social History   Socioeconomic History  . Marital status: Married    Spouse name: Not on file  . Number of children: Not on file  . Years of education: Not on file  . Highest education level: High school graduate  Occupational History  . Occupation: retired   Tobacco Use  . Smoking status: Never Smoker  . Smokeless tobacco: Never Used  Substance and Sexual Activity  . Alcohol use: No  . Drug use: No  . Sexual activity: Yes  Other Topics Concern  . Not on file  Social History Narrative   Goes fishing and hunting    Meets with friends every morning  for breakfast    [employer]    Social Determinants of Health   Financial Resource Strain: Low Risk   . Difficulty of Paying Living Expenses: Not hard at all  Food Insecurity: No Food Insecurity  . Worried About Charity fundraiser in the Last Year: Never true  . Ran Out of Food in the Last Year: Never true  Transportation Needs: No Transportation Needs  . Lack of Transportation (Medical): No  . Lack of Transportation (Non-Medical): No  Physical Activity: Inactive  . Days of Exercise per Week: 0 days  . Minutes of Exercise per Session: 0 min  Stress:   . Feeling of Stress : Not on file  Social  Connections: Slightly Isolated  . Frequency of Communication with Friends and Family: More than three times a week  . Frequency of Social Gatherings with Friends and Family: Never  . Attends Religious Services: More than 4 times per year  . Active Member of Clubs or Organizations: No  . Attends Archivist Meetings: Never  . Marital Status: Married    Outpatient Encounter Medications as of 01/29/2020  Medication Sig  . acetaminophen (TYLENOL) 500 MG tablet Take 500 mg by mouth every 6 (six) hours as needed (for pain.).   Marland Kitchen aspirin EC 81 MG tablet Take 81 mg by mouth daily.  . cetirizine (ZYRTEC) 10 MG tablet Take 1 tablet (10 mg total) by mouth daily. (Patient taking differently: Take 10 mg by mouth every Monday, Tuesday, Wednesday, Thursday, and Friday. )  . Cholecalciferol (VITAMIN D3) 50 MCG (2000 UT) TABS Take 2,000 Units by mouth 2 (two) times a week. Once a day  . Coenzyme Q10 100 MG TABS Take 100 mg by mouth daily.  . diphenhydrAMINE (BENADRYL) 25 MG tablet Take 25 mg by mouth at bedtime.   . fluticasone (FLONASE) 50 MCG/ACT nasal spray PLACE 2 SPRAYS INTO EACH NOSTRIL DAILY (Patient taking differently: Place 2 sprays into both nostrils daily. Every morning)  . ibuprofen (ADVIL) 200 MG tablet Take 400 mg by mouth every 8 (eight) hours as needed (for pain.).  Marland Kitchen lisinopril (PRINIVIL,ZESTRIL) 10 MG tablet Take 1 tablet (10 mg total) by mouth daily. (Patient taking differently: Take 10 mg by mouth every Monday, Tuesday, Wednesday, Thursday, and Friday. )  . meloxicam (MOBIC) 15 MG tablet Take 1 tablet (15 mg total) by mouth daily. (Patient taking differently: Take 15 mg by mouth every Monday, Tuesday, Wednesday, Thursday, and Friday. )  . Multiple Vitamin (MULTIVITAMIN WITH MINERALS) TABS tablet Take 1 tablet by mouth daily. Men's Multivitamin 50+  . simvastatin (ZOCOR) 20 MG tablet Take 1 tablet (20 mg total) by mouth at bedtime. (Patient taking differently: Take 20 mg by mouth every  Monday, Tuesday, Wednesday, Thursday, and Friday. AT BEDTIME)   No facility-administered encounter medications on file as of 01/29/2020.    Activities of Daily Living In your present state of health, do you have any difficulty performing the following activities: 01/29/2020  Hearing? N  Comment no hearing aids  Vision? Y  Comment eyeglasses, has cataract on both eyes. goes to dr.woodard and Dr.Hecter  Difficulty concentrating or making decisions? N  Walking or climbing stairs? Y  Comment knee pain  Dressing or bathing? N  Doing errands, shopping? N  Preparing Food and eating ? N  Using the Toilet? N  In the past six months, have you accidently leaked urine? N  Do you have problems with loss of bowel control? N  Managing your Medications?  N  Managing your Finances? N  Housekeeping or managing your Housekeeping? N  Some recent data might be hidden    Patient Care Team: Venita Lick, NP as PCP - General (Nurse Practitioner) Roseanne Kaufman, MD as Consulting Physician (Orthopedic Surgery) Lucilla Lame, MD as Consulting Physician (Gastroenterology)   Assessment:   This is a routine wellness examination for [employer]  Exercise Activities and Dietary recommendations Current Exercise Habits: The patient does not participate in regular exercise at present, Exercise limited by: orthopedic condition(s)  Goals Addressed   None     Fall Risk: Fall Risk  01/29/2020 08/04/2019 01/26/2019 11/12/2017 07/15/2017  Falls in the past year? 0 0 0 Yes No  Number falls in past yr: 0 0 - 1 -  Injury with Fall? 0 0 - No -    FALL RISK PREVENTION PERTAINING TO THE HOME:  Any stairs in or around the home? Yes  If so, are there any without handrails? No   Home free of loose throw rugs in walkways, pet beds, electrical cords, etc? Yes  Adequate lighting in your home to reduce risk of falls? Yes   ASSISTIVE DEVICES UTILIZED TO PREVENT FALLS:  Life alert? No  Use of a cane, walker or w/c? No   Grab bars in the bathroom? No  Shower chair or bench in shower? No  Elevated toilet seat or a handicapped toilet? Yes   TIMED UP AND GO:  Unable to perform   Depression Screen PHQ 2/9 Scores 01/29/2020 01/26/2019 02/15/2018 11/12/2017  PHQ - 2 Score 0 0 0 0  PHQ- 9 Score - - 0 -    Cognitive Function     6CIT Screen 01/26/2019 11/12/2017  What Year? 0 points 0 points  What month? 0 points 0 points  What time? 0 points 0 points  Count back from 20 0 points 0 points  Months in reverse 0 points 0 points  Repeat phrase 0 points 0 points  Total Score 0 0    Immunization History  Administered Date(s) Administered  . Fluad Quad(high Dose 65+) 08/04/2019  . Influenza, High Dose Seasonal PF 08/11/2018  . Influenza-Unspecified 11/06/2014, 09/06/2015  . PFIZER SARS-COV-2 Vaccination 01/03/2020, 01/24/2020  . Pneumococcal Conjugate-13 07/07/2016  . Pneumococcal Polysaccharide-23 11/12/2017  . Td 11/28/2015    Qualifies for Shingles Vaccine? Yes  Zostavax completed n/a. Due for Shingrix. Education has been provided regarding the importance of this vaccine. Pt has been advised to call insurance company to determine out of pocket expense. Advised may also receive vaccine at local pharmacy or Health Dept. Verbalized acceptance and understanding.  Tdap: up to date   Flu Vaccine: up to date  Pneumococcal Vaccine: up to date    Covid-19 Vaccine: completed series   Screening Tests Health Maintenance  Topic Date Due  . COLONOSCOPY  11/09/2022  . TETANUS/TDAP  11/27/2025  . INFLUENZA VACCINE  Completed  . Hepatitis C Screening  Completed  . PNA vac Low Risk Adult  Completed   Cancer Screenings:  Colorectal Screening: Completed 2018. Repeat every 5 years  Lung Cancer Screening: (Low Dose CT Chest recommended if Age 3-80 years, 30 pack-year currently smoking OR have quit w/in 15years.) does not qualify.    Additional Screening:  Hepatitis C Screening: does qualify; Completed  2018  Vision Screening: Recommended annual ophthalmology exams for early detection of glaucoma and other disorders of the eye. Is the patient up to date with their annual eye exam?  Yes  Who is the  provider or what is the name of the office in which the pt attends annual eye exams? Dr.Woodard   Dental Screening: Recommended annual dental exams for proper oral hygiene  Community Resource Referral:  CRR required this visit?  No        Plan:  I have personally reviewed and addressed the Medicare Annual Wellness questionnaire and have noted the following in the patient's chart:  A. Medical and social history B. Use of alcohol, tobacco or illicit drugs  C. Current medications and supplements D. Functional ability and status E.  Nutritional status F.  Physical activity G. Advance directives H. List of other physicians I.  Hospitalizations, surgeries, and ER visits in previous 12 months J.  [place] such as hearing and vision if needed, cognitive and depression L. Referrals and appointments   In addition, I have reviewed and discussed with patient certain preventive protocols, quality metrics, and best practice recommendations. A written personalized care plan for preventive services as well as general preventive health recommendations were provided to patient.   Signed,   Bevelyn Ngo, LPN  [id number] Nurse Health Advisor   Nurse Notes: none

## 2020-01-29 NOTE — Patient Instructions (Signed)
Christopher Huang , Thank you for taking time to come for your Medicare Wellness Visit. I appreciate your ongoing commitment to your health goals. Please review the following plan we discussed and let me know if I can assist you in the future.   Screening recommendations/referrals: Colonoscopy: completed 2018 due 2023 Recommended yearly ophthalmology/optometry visit for glaucoma screening and checkup Recommended yearly dental visit for hygiene and checkup  Vaccinations: Influenza vaccine: up to date Pneumococcal vaccine: up to date Tdap vaccine: up to date  Shingles vaccine: shingrix eligible    Covid-19: completed   Advanced directives: Please bring a copy of your health care power of attorney and living will to the office at your convenience.  Conditions/risks identified: discussed nurse case manager- Pam,RN.  Next appointment: Follow up in one year for your annual wellness visit   Preventive Care 65 Years and Older, Male Preventive care refers to lifestyle choices and visits with your health care provider that can promote health and wellness. What does preventive care include?  A yearly physical exam. This is also called an annual well check.  Dental exams once or twice a year.  Routine eye exams. Ask your health care provider how often you should have your eyes checked.  Personal lifestyle choices, including:  Daily care of your teeth and gums.  Regular physical activity.  Eating a healthy diet.  Avoiding tobacco and drug use.  Limiting alcohol use.  Practicing safe sex.  Taking low doses of aspirin every day.  Taking vitamin and mineral supplements as recommended by your health care provider. What happens during an annual well check? The services and screenings done by your health care provider during your annual well check will depend on your age, overall health, lifestyle risk factors, and family history of disease. Counseling  Your health care provider may ask you  questions about your:  Alcohol use.  Tobacco use.  Drug use.  Emotional well-being.  Home and relationship well-being.  Sexual activity.  Eating habits.  History of falls.  Memory and ability to understand (cognition).  Work and work Statistician. Screening  You may have the following tests or measurements:  Height, weight, and BMI.  Blood pressure.  Lipid and cholesterol levels. These may be checked every 5 years, or more frequently if you are over 80 years old.  Skin check.  Lung cancer screening. You may have this screening every year starting at age 13 if you have a 30-pack-year history of smoking and currently smoke or have quit within the past 15 years.  Fecal occult blood test (FOBT) of the stool. You may have this test every year starting at age 30.  Flexible sigmoidoscopy or colonoscopy. You may have a sigmoidoscopy every 5 years or a colonoscopy every 10 years starting at age 68.  Prostate cancer screening. Recommendations will vary depending on your family history and other risks.  Hepatitis C blood test.  Hepatitis B blood test.  Sexually transmitted disease (STD) testing.  Diabetes screening. This is done by checking your blood sugar (glucose) after you have not eaten for a while (fasting). You may have this done every 1-3 years.  Abdominal aortic aneurysm (AAA) screening. You may need this if you are a current or former smoker.  Osteoporosis. You may be screened starting at age 84 if you are at high risk. Talk with your health care provider about your test results, treatment options, and if necessary, the need for more tests. Vaccines  Your health care provider may recommend  certain vaccines, such as:  Influenza vaccine. This is recommended every year.  Tetanus, diphtheria, and acellular pertussis (Tdap, Td) vaccine. You may need a Td booster every 10 years.  Zoster vaccine. You may need this after age 52.  Pneumococcal 13-valent conjugate  (PCV13) vaccine. One dose is recommended after age 56.  Pneumococcal polysaccharide (PPSV23) vaccine. One dose is recommended after age 34. Talk to your health care provider about which screenings and vaccines you need and how often you need them. This information is not intended to replace advice given to you by your health care provider. Make sure you discuss any questions you have with your health care provider. Document Released: 12/06/2015 Document Revised: 07/29/2016 Document Reviewed: 09/10/2015 Elsevier Interactive Patient Education  2017 [employer] Prevention in the Home Falls can cause injuries. They can happen to people of all ages. There are many things you can do to make your home safe and to help prevent falls. What can I do on the outside of my home?  Regularly fix the edges of walkways and driveways and fix any cracks.  Remove anything that might make you trip as you walk through a door, such as a raised step or threshold.  Trim any bushes or trees on the path to your home.  Use bright outdoor lighting.  Clear any walking paths of anything that might make someone trip, such as rocks or tools.  Regularly check to see if handrails are loose or broken. Make sure that both sides of any steps have handrails.  Any raised decks and porches should have guardrails on the edges.  Have any leaves, snow, or ice cleared regularly.  Use sand or salt on walking paths during winter.  Clean up any spills in your garage right away. This includes oil or grease spills. What can I do in the bathroom?  Use night lights.  Install grab bars by the toilet and in the tub and shower. Do not use towel bars as grab bars.  Use non-skid mats or decals in the tub or shower.  If you need to sit down in the shower, use a plastic, non-slip stool.  Keep the floor dry. Clean up any water that spills on the floor as soon as it happens.  Remove soap buildup in the tub or shower  regularly.  Attach bath mats securely with double-sided non-slip rug tape.  Do not have throw rugs and other things on the floor that can make you trip. What can I do in the bedroom?  Use night lights.  Make sure that you have a light by your bed that is easy to reach.  Do not use any sheets or blankets that are too big for your bed. They should not hang down onto the floor.  Have a firm chair that has side arms. You can use this for support while you get dressed.  Do not have throw rugs and other things on the floor that can make you trip. What can I do in the kitchen?  Clean up any spills right away.  Avoid walking on wet floors.  Keep items that you use a lot in easy-to-reach places.  If you need to reach something above you, use a strong step stool that has a grab bar.  Keep electrical cords out of the way.  Do not use floor polish or wax that makes floors slippery. If you must use wax, use non-skid floor wax.  Do not have throw rugs and other  things on the floor that can make you trip. What can I do with my stairs?  Do not leave any items on the stairs.  Make sure that there are handrails on both sides of the stairs and use them. Fix handrails that are broken or loose. Make sure that handrails are as long as the stairways.  Check any carpeting to make sure that it is firmly attached to the stairs. Fix any carpet that is loose or worn.  Avoid having throw rugs at the top or bottom of the stairs. If you do have throw rugs, attach them to the floor with carpet tape.  Make sure that you have a light switch at the top of the stairs and the bottom of the stairs. If you do not have them, ask someone to add them for you. What else can I do to help prevent falls?  Wear shoes that:  Do not have high heels.  Have rubber bottoms.  Are comfortable and fit you well.  Are closed at the toe. Do not wear sandals.  If you use a stepladder:  Make sure that it is fully  opened. Do not climb a closed stepladder.  Make sure that both sides of the stepladder are locked into place.  Ask someone to hold it for you, if possible.  Clearly mark and make sure that you can see:  Any grab bars or handrails.  First and last steps.  Where the edge of each step is.  Use tools that help you move around (mobility aids) if they are needed. These include:  Canes.  Walkers.  Scooters.  Crutches.  Turn on the lights when you go into a dark area. Replace any light bulbs as soon as they burn out.  Set up your furniture so you have a clear path. Avoid moving your furniture around.  If any of your floors are uneven, fix them.  If there are any pets around you, be aware of where they are.  Review your medicines with your doctor. Some medicines can make you feel dizzy. This can increase your chance of falling. Ask your doctor what other things that you can do to help prevent falls. This information is not intended to replace advice given to you by your health care provider. Make sure you discuss any questions you have with your health care provider. Document Released: 09/05/2009 Document Revised: 04/16/2016 Document Reviewed: 12/14/2014 Elsevier Interactive Patient Education  2017 [employer].

## 2020-01-31 ENCOUNTER — Encounter
Admission: RE | Admit: 2020-01-31 | Discharge: 2020-01-31 | Disposition: A | Discharge status: Home / Self Care | Payer: PPO | Source: Ambulatory Visit | Attending: Orthopedic Surgery | Admitting: Orthopedic Surgery

## 2020-01-31 DIAGNOSIS — A4901 Methicillin susceptible Staphylococcus aureus infection, unspecified site: Secondary | ICD-10-CM | POA: Diagnosis not present

## 2020-01-31 DIAGNOSIS — Z01810 Encounter for preprocedural cardiovascular examination: Secondary | ICD-10-CM | POA: Diagnosis not present

## 2020-01-31 DIAGNOSIS — Z01812 Encounter for preprocedural laboratory examination: Secondary | ICD-10-CM | POA: Insufficient documentation

## 2020-01-31 DIAGNOSIS — I491 Atrial premature depolarization: Secondary | ICD-10-CM | POA: Insufficient documentation

## 2020-01-31 DIAGNOSIS — I10 Essential (primary) hypertension: Secondary | ICD-10-CM | POA: Insufficient documentation

## 2020-01-31 HISTORY — DX: Personal history of urinary calculi: Z87.442

## 2020-01-31 NOTE — Patient Instructions (Signed)
Your procedure is scheduled on: 02-09-20 FRIDAY Report to Same Day Surgery 2nd floor medical mall [facility] Entrance-take elevator on left to 2nd floor.  Check in with surgery information desk.) To find out your arrival time please call [phone] between 1PM - 3PM on 02-08-20 THURSDAY  Remember: Instructions that are not followed completely may result in serious medical risk, up to and including death, or upon the discretion of your surgeon and anesthesiologist your surgery may need to be rescheduled.    _x___ 1. Do not eat food after midnight the night before your procedure. NO GUM OR CANDY AFTER MIDNIGHT. You may drink clear liquids up to 2 hours before you are scheduled to arrive at the hospital for your procedure.  Do not drink clear liquids within 2 hours of your scheduled arrival to the hospital.  Clear liquids include  --Water or Apple juice without pulp  --Gatorade  --Black Coffee or Clear Tea (No milk, no creamers, do not add anything to the coffee or Tea   ____Ensure clear carbohydrate drink on the way to the hospital for bariatric patients  _X___Ensure clear carbohydrate drink -FINISH DRINK 2  HOURS PRIOR TO ARRIVAL [place]     __x__ 2. No Alcohol for 24 hours before or after surgery.   __x__3. No Smoking or e-cigarettes for 24 prior to surgery.  Do not use any chewable tobacco products for at least 6 hour prior to surgery   ____  4. Bring all medications with you on the day of surgery if instructed.    __x__ 5. Notify your doctor if there is any change in your medical condition     (cold, fever, infections).    x___6. On the morning of surgery brush your teeth with toothpaste and water.  You may rinse your mouth with mouth wash if you wish.  Do not swallow any toothpaste or mouthwash.   Do not wear jewelry, make-up, hairpins, clips or nail polish.  Do not wear lotions, powders, or perfumes.  Do not shave 48 hours prior to surgery. Men may shave face and  neck.  Do not bring valuables to the hospital.    [facility] is not responsible for any belongings or valuables.               Contacts, dentures or bridgework may not be worn into surgery.  Leave your suitcase in the car. After surgery it may be brought to your room.  For patients admitted to the hospital, discharge time is determined by your treatment team.  _  Patients discharged the day of surgery will not be allowed to drive home.  You will need someone to drive you home and stay with you the night of your procedure.    Please read over the following fact sheets that you were given:   [facility] Preparing for Surgery and  MRSA Information/INCENTIVE SPIROMETER INSTRUCTIONS  ____ Take anti-hypertensive listed below, cardiac, seizure, asthma, anti-reflux and psychiatric medicines. These include:  1. NONE  2.  3.  4.  5.  6.  ____Fleets enema or Magnesium Citrate as directed.   _x___ Use CHG Soap or sage wipes as directed on instruction sheet   ____ Use inhalers on the day of surgery and bring to hospital day of surgery  ____ Stop Metformin and Janumet 2 days prior to surgery.    ____ Take 1/2 of usual insulin dose the night before surgery and none on the morning surgery.   _X___  Follow recommendations from Cardiologist, Pulmonologist or PCP regarding stopping Aspirin, Coumadin, Plavix ,Eliquis, Effient, or Pradaxa, and Pletal-STOP ASPIRIN 7 DAYS PRIOR TO SURGERY  X____Stop Anti-inflammatories such as Advil, Aleve, Ibuprofen, Motrin, Naproxen, MELOXICAM (MOBIC) Naprosyn, Goodies powders or aspirin products 7 DAYS PRIOR TO SURGERY-OK to take Tylenol    _x___ Stop supplements until after surgery-STOP CO Q-10 7 DAYS PRIOR TO SURGERY-MAY RESUME AFTER SURGERY   ____ Bring C-Pap to the hospital.

## 2020-02-02 ENCOUNTER — Encounter
Admission: RE | Admit: 2020-02-02 | Discharge: 2020-02-02 | Disposition: A | Discharge status: Home / Self Care | Payer: PPO | Source: Ambulatory Visit | Attending: Orthopedic Surgery | Admitting: Orthopedic Surgery

## 2020-02-02 ENCOUNTER — Other Ambulatory Visit: Payer: Self-pay

## 2020-02-02 DIAGNOSIS — Z01812 Encounter for preprocedural laboratory examination: Secondary | ICD-10-CM | POA: Diagnosis not present

## 2020-02-02 DIAGNOSIS — I10 Essential (primary) hypertension: Secondary | ICD-10-CM | POA: Diagnosis not present

## 2020-02-02 LAB — SURGICAL PCR SCREEN
MRSA, PCR: NEGATIVE
Staphylococcus aureus: POSITIVE — AB

## 2020-02-02 NOTE — Pre-Procedure Instructions (Signed)
Left message with Delynn Flavin that pt needed clearance and that this was faxed to pcp and their office as well

## 2020-02-02 NOTE — Pre-Procedure Instructions (Signed)
Called Dr Kris Hartmann regarding EKG showing septal infarct and the only other ekg for comparison was in care everywhere and showed NSR-Called Kiersten at Dr Rudene Christians office and informed her of this. Faxed to PCP office [facility] and also to Dr Rudene Christians

## 2020-02-05 ENCOUNTER — Other Ambulatory Visit: Payer: Self-pay

## 2020-02-05 DIAGNOSIS — J302 Other seasonal allergic rhinitis: Secondary | ICD-10-CM

## 2020-02-05 MED ORDER — FLUTICASONE PROPIONATE 50 MCG/ACT NA SUSP: NASAL | 3 refills | Status: DC

## 2020-02-05 NOTE — Telephone Encounter (Signed)
LOV, 08/04/2019, NOV: 02/07/2020.

## 2020-02-06 NOTE — Pre-Procedure Instructions (Signed)
Left message for tiffancy at [facility] to say we still have no orders and medical clearance for this patient. The patient is scheduled for a COVID test tomorrow so we need orders for labs to be done.

## 2020-02-06 NOTE — Pre-Procedure Instructions (Signed)
Secure Chat to Dr. Rudene Christians about need for orders.

## 2020-02-06 NOTE — TOC Progression Note (Signed)
Transition of Care [facility]) - Progression Note    Patient Details  Name: Christopher Huang MRN: [id number] Date of Birth: 03-Mar-1951  [facility] of [facility]) CM/SW [place], RN Phone Number: 02/06/2020, 3:24 PM  Clinical Narrative:    Requested the lovenox price will notify the patient of the price once obtained        Expected Discharge Plan and Services                                                 Social Determinants of Health (SDOH) Interventions    Readmission Risk Interventions No flowsheet data found.

## 2020-02-07 ENCOUNTER — Telehealth: Payer: Self-pay

## 2020-02-07 ENCOUNTER — Other Ambulatory Visit: Payer: Self-pay | Admitting: Orthopedic Surgery

## 2020-02-07 ENCOUNTER — Other Ambulatory Visit: Payer: Self-pay

## 2020-02-07 ENCOUNTER — Telehealth (INDEPENDENT_AMBULATORY_CARE_PROVIDER_SITE_OTHER): Payer: PPO | Admitting: Nurse Practitioner

## 2020-02-07 ENCOUNTER — Encounter: Payer: Self-pay | Admitting: Nurse Practitioner

## 2020-02-07 ENCOUNTER — Encounter
Admission: RE | Admit: 2020-02-07 | Discharge: 2020-02-07 | Disposition: A | Discharge status: Home / Self Care | Payer: PPO | Source: Ambulatory Visit | Attending: Orthopedic Surgery | Admitting: Orthopedic Surgery

## 2020-02-07 ENCOUNTER — Encounter: Payer: PPO | Admitting: Nurse Practitioner

## 2020-02-07 ENCOUNTER — Encounter: Payer: PPO | Admitting: Family Medicine

## 2020-02-07 ENCOUNTER — Ambulatory Visit: Payer: PPO | Admitting: Nurse Practitioner

## 2020-02-07 VITALS — BP 136/70

## 2020-02-07 DIAGNOSIS — Z87442 Personal history of urinary calculi: Secondary | ICD-10-CM | POA: Diagnosis not present

## 2020-02-07 DIAGNOSIS — M25762 Osteophyte, left knee: Secondary | ICD-10-CM | POA: Diagnosis present

## 2020-02-07 DIAGNOSIS — Z96652 Presence of left artificial knee joint: Secondary | ICD-10-CM | POA: Diagnosis not present

## 2020-02-07 DIAGNOSIS — Z8546 Personal history of malignant neoplasm of prostate: Secondary | ICD-10-CM | POA: Diagnosis not present

## 2020-02-07 DIAGNOSIS — F5101 Primary insomnia: Secondary | ICD-10-CM

## 2020-02-07 DIAGNOSIS — I10 Essential (primary) hypertension: Secondary | ICD-10-CM

## 2020-02-07 DIAGNOSIS — M1712 Unilateral primary osteoarthritis, left knee: Secondary | ICD-10-CM | POA: Diagnosis present

## 2020-02-07 DIAGNOSIS — Z01818 Encounter for other preprocedural examination: Secondary | ICD-10-CM | POA: Insufficient documentation

## 2020-02-07 DIAGNOSIS — G4700 Insomnia, unspecified: Secondary | ICD-10-CM | POA: Insufficient documentation

## 2020-02-07 DIAGNOSIS — R9431 Abnormal electrocardiogram [ECG] [EKG]: Secondary | ICD-10-CM | POA: Diagnosis not present

## 2020-02-07 DIAGNOSIS — E785 Hyperlipidemia, unspecified: Secondary | ICD-10-CM

## 2020-02-07 DIAGNOSIS — Z20822 Contact with and (suspected) exposure to covid-19: Secondary | ICD-10-CM | POA: Diagnosis present

## 2020-02-07 DIAGNOSIS — E7800 Pure hypercholesterolemia, unspecified: Secondary | ICD-10-CM | POA: Diagnosis not present

## 2020-02-07 DIAGNOSIS — Z885 Allergy status to narcotic agent status: Secondary | ICD-10-CM | POA: Diagnosis not present

## 2020-02-07 DIAGNOSIS — Z01810 Encounter for preprocedural cardiovascular examination: Secondary | ICD-10-CM | POA: Diagnosis not present

## 2020-02-07 DIAGNOSIS — Z471 Aftercare following joint replacement surgery: Secondary | ICD-10-CM | POA: Diagnosis not present

## 2020-02-07 LAB — CBC WITH DIFFERENTIAL/PLATELET
Abs Immature Granulocytes: 0.02 10*3/uL (ref 0.00–0.07)
Basophils Absolute: 0.1 10*3/uL (ref 0.0–0.1)
Basophils Relative: 1 %
Eosinophils Absolute: 0.2 10*3/uL (ref 0.0–0.5)
Eosinophils Relative: 4 %
HCT: 47.6 % (ref 39.0–52.0)
Hemoglobin: 15.7 g/dL (ref 13.0–17.0)
Immature Granulocytes: 0 %
Lymphocytes Relative: 30 %
Lymphs Abs: 1.5 10*3/uL (ref 0.7–4.0)
MCH: 30.1 pg (ref 26.0–34.0)
MCHC: 33 g/dL (ref 30.0–36.0)
MCV: 91.2 fL (ref 80.0–100.0)
Monocytes Absolute: 0.4 10*3/uL (ref 0.1–1.0)
Monocytes Relative: 9 %
Neutro Abs: 2.7 10*3/uL (ref 1.7–7.7)
Neutrophils Relative %: 56 %
Platelets: 247 10*3/uL (ref 150–400)
RBC: 5.22 MIL/uL (ref 4.22–5.81)
RDW: 13.2 % (ref 11.5–15.5)
WBC: 4.8 10*3/uL (ref 4.0–10.5)
nRBC: 0 % (ref 0.0–0.2)

## 2020-02-07 LAB — COMPREHENSIVE METABOLIC PANEL
ALT: 17 U/L (ref 0–44)
AST: 20 U/L (ref 15–41)
Albumin: 4.2 g/dL (ref 3.5–5.0)
Alkaline Phosphatase: 73 U/L (ref 38–126)
Anion gap: 6 (ref 5–15)
BUN: 17 mg/dL (ref 8–23)
CO2: 33 mmol/L — ABNORMAL HIGH (ref 22–32)
Calcium: 9.5 mg/dL (ref 8.9–10.3)
Chloride: 103 mmol/L (ref 98–111)
Creatinine, Ser: 0.92 mg/dL (ref 0.61–1.24)
GFR calc Af Amer: >60 mL/min (ref >60)
GFR calc non Af Amer: >60 mL/min (ref >60)
Glucose, Bld: 97 mg/dL (ref 70–99)
Potassium: 4.4 mmol/L (ref 3.5–5.1)
Sodium: 142 mmol/L (ref 135–145)
Total Bilirubin: 0.9 mg/dL (ref 0.3–1.2)
Total Protein: 7 g/dL (ref 6.5–8.1)

## 2020-02-07 LAB — URINALYSIS, ROUTINE W REFLEX MICROSCOPIC
Bilirubin Urine: NEGATIVE
Glucose, UA: NEGATIVE mg/dL
Hgb urine dipstick: NEGATIVE
Ketones, ur: NEGATIVE mg/dL
Leukocytes,Ua: NEGATIVE
Nitrite: NEGATIVE
Protein, ur: NEGATIVE mg/dL
Specific Gravity, Urine: 1.019 (ref 1.005–1.030)
pH: 5 (ref 5.0–8.0)

## 2020-02-07 LAB — TYPE AND SCREEN
ABO/RH(D): O POS
Antibody Screen: NEGATIVE

## 2020-02-07 LAB — SARS CORONAVIRUS 2 (TAT 6-24 HRS): SARS Coronavirus 2: NEGATIVE

## 2020-02-07 MED ORDER — MELATONIN 10 MG PO TABS: 10.0000 mg | ORAL_TABLET | Freq: Every day | ORAL | 5 refills | Status: DC

## 2020-02-07 MED ORDER — SIMVASTATIN 20 MG PO TABS: 20.0000 mg | ORAL_TABLET | ORAL | 3 refills | Status: DC

## 2020-02-07 MED ORDER — LISINOPRIL 10 MG PO TABS: 10.0000 mg | ORAL_TABLET | ORAL | 3 refills | Status: DC

## 2020-02-07 NOTE — Assessment & Plan Note (Signed)
Chronic, ongoing with BP at goal on current regimen.  Continue Lisinopril 10 MG on M-T-W-TH-Fri.  Recommend checking BP at least three times a week at home and documenting.  Plan on labs next face to face visit.  Return in 6 months.

## 2020-02-07 NOTE — Telephone Encounter (Signed)
Called and spoke with Nira Conn, they do not need the from now.  Copied from [place] [phone]. Topic: General - Inquiry >> Feb 07, 2020  9:37 AM Reyne Dumas L wrote: Reason for CRM:  Heather with [facility] calling to find out if pre-admission clearance form has been received and when they can expect that back.  Needed because EKG was abnormal and anesthesia is requiring this. Nira Conn can be reached at [phone] Fax number:  [phone]

## 2020-02-07 NOTE — Progress Notes (Signed)
BP 136/70    Subjective:    Patient ID: Christopher Huang, male    DOB: [birth year], 69 y.o.   MRN: [id number]  HPI: Christopher Huang is a 69 y.o. male  Chief Complaint  Patient presents with  . Medication Refill    HYPERTENSION / HYPERLIPIDEMIA Continues on Lisinopril 10 MG on M-T-W-TH-Fri, taking Simvastatin on same schedule, ASA.  Is scheduled for TKA on Friday (02/09/2020).  Received paperwork from pre op at [facility] on Tuesday noting abnormal EKG with NSR, premature atrial complexes and possible septal infarct.  He denies any history of MI.  Has not seen cardiology.  Virtual visit today and unable to repeat EKG.  Have advised them to reach out to Dr. Rudene Christians office about this and will place urgent referral to cardiology to assess prior to surgery on Friday.  Denies any SOB, CP, orthopnea. Satisfied with current treatment? yes Duration of hypertension: chronic BP monitoring frequency: rarely BP range:  BP medication side effects: no Duration of hyperlipidemia: chronic Cholesterol medication side effects: no Cholesterol supplements: none Medication compliance: good compliance Aspirin: yes Recent stressors: no Recurrent headaches: no Visual changes: no Palpitations: no Dyspnea: no Chest pain: no Lower extremity edema: no Dizzy/lightheaded: no   INSOMNIA Currently uses Benadryl every evening.  Educated his wife and him on BEERS criteria and to avoid medications containing Benadryl.  Educated on Melatonin, Duration: chronic Satisfied with sleep quality: no Difficulty falling asleep: yes Difficulty staying asleep: yes Waking a few hours after sleep onset: yes Early morning awakenings: no Daytime hypersomnolence: no Wakes feeling refreshed: no Good sleep hygiene: no Apnea: no Snoring: no Depressed/anxious mood: no Recent stress: no Restless legs/nocturnal leg cramps: no Chronic pain/arthritis: yes History of sleep study: no Treatments attempted: benadryl   Relevant past  medical, surgical, family and social history reviewed and updated as indicated. Interim medical history since our last visit reviewed. Allergies and medications reviewed and updated.  Review of Systems  Constitutional: Negative for activity change, diaphoresis, fatigue and fever.  Respiratory: Negative for cough, chest tightness, shortness of breath and wheezing.   Cardiovascular: Negative for chest pain, palpitations and leg swelling.  Gastrointestinal: Negative.   Neurological: Negative.   Psychiatric/Behavioral: Positive for sleep disturbance. Negative for decreased concentration, self-injury and suicidal ideas. The patient is not nervous/anxious.    Per HPI unless specifically indicated above     Objective:    BP 136/70   Wt Readings from Last 3 Encounters:  01/29/20 219 lb (99.3 kg)  02/02/19 226 lb 6 oz (102.7 kg)  01/26/19 229 lb 12.8 oz (104.2 kg)    Physical Exam Vitals and nursing note reviewed.  Constitutional:      General: He is awake. He is not in acute distress.    Appearance: He is well-developed. He is not ill-appearing.  HENT:     Head: Normocephalic.     Right Ear: Hearing normal. No drainage.     Left Ear: Hearing normal. No drainage.  Eyes:     General: Lids are normal.        Right eye: No discharge.        Left eye: No discharge.     Conjunctiva/sclera: Conjunctivae normal.  Pulmonary:     Effort: Pulmonary effort is normal. No accessory muscle usage or respiratory distress.  Musculoskeletal:     Cervical back: Normal range of motion.  Neurological:     Mental Status: He is alert and oriented to person, place, and time.  Psychiatric:        Mood and Affect: Mood normal.        Behavior: Behavior normal. Behavior is cooperative.        Thought Content: Thought content normal.        Judgment: Judgment normal.     Results for orders placed or performed during the hospital encounter of 02/07/20  CBC WITH DIFFERENTIAL  Result Value Ref Range    WBC 4.8 4.0 - 10.5 K/uL   RBC 5.22 4.22 - 5.81 MIL/uL   Hemoglobin 15.7 13.0 - 17.0 g/dL   HCT 47.6 39.0 - 52.0 %   MCV 91.2 80.0 - 100.0 fL   MCH 30.1 26.0 - 34.0 pg   MCHC 33.0 30.0 - 36.0 g/dL   RDW 13.2 11.5 - 15.5 %   Platelets 247 150 - 400 K/uL   nRBC 0.0 0.0 - 0.2 %   Neutrophils Relative % 56 %   Neutro Abs 2.7 1.7 - 7.7 K/uL   Lymphocytes Relative 30 %   Lymphs Abs 1.5 0.7 - 4.0 K/uL   Monocytes Relative 9 %   Monocytes Absolute 0.4 0.1 - 1.0 K/uL   Eosinophils Relative 4 %   Eosinophils Absolute 0.2 0.0 - 0.5 K/uL   Basophils Relative 1 %   Basophils Absolute 0.1 0.0 - 0.1 K/uL   Immature Granulocytes 0 %   Abs Immature Granulocytes 0.02 0.00 - 0.07 K/uL  Comprehensive metabolic panel  Result Value Ref Range   Sodium 142 135 - 145 mmol/L   Potassium 4.4 3.5 - 5.1 mmol/L   Chloride 103 98 - 111 mmol/L   CO2 33 (H) 22 - 32 mmol/L   Glucose, Bld 97 70 - 99 mg/dL   BUN 17 8 - 23 mg/dL   Creatinine, Ser 0.92 0.61 - 1.24 mg/dL   Calcium 9.5 8.9 - 10.3 mg/dL   Total Protein 7.0 6.5 - 8.1 g/dL   Albumin 4.2 3.5 - 5.0 g/dL   AST 20 15 - 41 U/L   ALT 17 0 - 44 U/L   Alkaline Phosphatase 73 38 - 126 U/L   Total Bilirubin 0.9 0.3 - 1.2 mg/dL   GFR calc non Af Amer >60 >60 mL/min   GFR calc Af Amer >60 >60 mL/min   Anion gap 6 5 - 15  Urinalysis, Routine w reflex microscopic  Result Value Ref Range   Color, Urine YELLOW (A) YELLOW   APPearance HAZY (A) CLEAR   Specific Gravity, Urine 1.019 1.005 - 1.030   pH 5.0 5.0 - 8.0   Glucose, UA NEGATIVE NEGATIVE mg/dL   Hgb urine dipstick NEGATIVE NEGATIVE   Bilirubin Urine NEGATIVE NEGATIVE   Ketones, ur NEGATIVE NEGATIVE mg/dL   Protein, ur NEGATIVE NEGATIVE mg/dL   Nitrite NEGATIVE NEGATIVE   Leukocytes,Ua NEGATIVE NEGATIVE  Type and screen Order type and screen if day of surgery is less than 15 days from draw of preadmission visit or order morning of surgery if day of surgery is greater than 6 days from preadmission  visit.  Result Value Ref Range   ABO/RH(D) O POS    Antibody Screen NEG    Sample Expiration 02/21/2020,2359    Extend sample reason      NO TRANSFUSIONS OR PREGNANCY IN THE PAST 3 MONTHS Performed at [facility], [address] [place], [place] [address]       Assessment & Plan:   Problem List Items Addressed This Visit  Cardiovascular and Mediastinum   Hypertension    Chronic, ongoing with BP at goal on current regimen.  Continue Lisinopril 10 MG on M-T-W-TH-Fri.  Recommend checking BP at least three times a week at home and documenting.  Plan on labs next face to face visit.  Return in 6 months.      Relevant Medications   simvastatin (ZOCOR) 20 MG tablet   lisinopril (ZESTRIL) 10 MG tablet     Other   Hyperlipidemia    Chronic, ongoing.  Continue Simvastatin M-T-W-TH-Fri.  Adjust dose as needed.  Obtain labs next face to face visit.  Return in 6 months.      Relevant Medications   simvastatin (ZOCOR) 20 MG tablet   lisinopril (ZESTRIL) 10 MG tablet   Abnormal EKG - Primary    Received pre op visit EKG from [facility] noting NSR, premature atrial complexes and possible septal infarct with request for recommendations.  Will place urgent referral to cardiology as patient is scheduled for TKA on Friday and needs repeat EKG and cardiology visit prior to this.  Discussed with patient and wife.        Relevant Orders   Ambulatory referral to Cardiology   Insomnia    Chronic, ongoing.  Discussed at length with wife and him BEERS criteria and to avoid Benadryl due to anticholinergic effects.  Recommend switch to Melatonin, which is to take nightly.  Will send in script at this time, but recommend if unable to obtain script then get OTC in vitamin section.  Return to office in 6 months.          Follow up plan: Return in about 6 months (around 08/09/2020) for HTN/HLD, insomnia.

## 2020-02-07 NOTE — Pre-Procedure Instructions (Signed)
I called over to Dr Durenda Age office and notified them that pt does NOT need clearance due to pt having a normal EKG

## 2020-02-07 NOTE — Assessment & Plan Note (Signed)
Received pre op visit EKG from [facility] noting NSR, premature atrial complexes and possible septal infarct with request for recommendations.  Will place urgent referral to cardiology as patient is scheduled for TKA on Friday and needs repeat EKG and cardiology visit prior to this.  Discussed with patient and wife.

## 2020-02-07 NOTE — Pre-Procedure Instructions (Signed)
Called Dr Kayleen Memos regarding medical clearance. Dr Ronelle Nigh had requested clearance due to EKG showing septal infarct (Dr Ronelle Nigh did not look at EKG. He made me read off what it said) Now Cardiology has read and confirmed EKG which states NSR with PAC's with NO septal infarct present. I asked Dr Kayleen Memos if we still need medical clearance since DR Ubaldo Glassing has confirmed EKG showing NO septal infarct now. Dr Kayleen Memos said since Dr Ubaldo Glassing read and confirmed EKG with NO septal infarct we are ok to proceed with pt NOT needing medical clearance

## 2020-02-07 NOTE — TOC Benefit Eligibility Note (Signed)
Transition of Care [facility]) Benefit Eligibility Note    Patient Details  Name: Christopher Huang MRN: [id number] Date of Birth: 1951/05/11   Medication/Dose: Enoxaparin 40mg  once daily for 14 days  Covered?: Yes  Tier: Other(Tier 4)  Prescription Coverage Preferred Pharmacy: [facility]  Spoke with Person/Company/Phone Number:: Minna Merritts with [employer] [phone] at [phone]  Co-Pay: $90 estimated copay  Prior Approval: No  Deductible: (No deductible on plan.)    Dannette Barbara Phone Number: [phone] or [phone] 02/07/2020, 9:57 AM

## 2020-02-07 NOTE — Assessment & Plan Note (Signed)
Chronic, ongoing.  Continue Simvastatin M-T-W-TH-Fri.  Adjust dose as needed.  Obtain labs next face to face visit.  Return in 6 months.

## 2020-02-07 NOTE — Patient Instructions (Signed)
DASH Eating Plan DASH stands for "Dietary Approaches to Stop Hypertension." The DASH eating plan is a healthy eating plan that has been shown to reduce high blood pressure (hypertension). It may also reduce your risk for type 2 diabetes, heart disease, and stroke. The DASH eating plan may also help with weight loss. What are tips for following this plan?  General guidelines  Avoid eating more than 2,300 mg (milligrams) of salt (sodium) a day. If you have hypertension, you may need to reduce your sodium intake to 1,500 mg a day.  Limit alcohol intake to no more than 1 drink a day for nonpregnant women and 2 drinks a day for men. One drink equals 12 oz of beer, 5 oz of wine, or 1 oz of hard liquor.  Work with your health care provider to maintain a healthy body weight or to lose weight. Ask what an ideal weight is for you.  Get at least 30 minutes of exercise that causes your heart to beat faster (aerobic exercise) most days of the week. Activities may include walking, swimming, or biking.  Work with your health care provider or diet and nutrition specialist (dietitian) to adjust your eating plan to your individual calorie needs. Reading food labels   Check food labels for the amount of sodium per serving. Choose foods with less than 5 percent of the Daily Value of sodium. Generally, foods with less than 300 mg of sodium per serving fit into this eating plan.  To find whole grains, look for the word "whole" as the first word in the ingredient list. Shopping  Buy products labeled as "low-sodium" or "no salt added."  Buy fresh foods. Avoid canned foods and premade or frozen meals. Cooking  Avoid adding salt when cooking. Use salt-free seasonings or herbs instead of table salt or sea salt. Check with your health care provider or pharmacist before using salt substitutes.  Do not fry foods. Cook foods using healthy methods such as baking, boiling, grilling, and broiling instead.  Cook with  heart-healthy oils, such as olive, canola, soybean, or sunflower oil. Meal planning  Eat a balanced diet that includes: ? 5 or more servings of fruits and vegetables each day. At each meal, try to fill half of your plate with fruits and vegetables. ? Up to 6-8 servings of whole grains each day. ? Less than 6 oz of lean meat, poultry, or fish each day. A 3-oz serving of meat is about the same size as a deck of cards. One egg equals 1 oz. ? 2 servings of low-fat dairy each day. ? A serving of nuts, seeds, or beans 5 times each week. ? Heart-healthy fats. Healthy fats called Omega-3 fatty acids are found in foods such as flaxseeds and coldwater fish, like sardines, salmon, and mackerel.  Limit how much you eat of the following: ? Canned or prepackaged foods. ? Food that is high in trans fat, such as fried foods. ? Food that is high in saturated fat, such as fatty meat. ? Sweets, desserts, sugary drinks, and other foods with added sugar. ? Full-fat dairy products.  Do not salt foods before eating.  Try to eat at least 2 vegetarian meals each week.  Eat more home-cooked food and less restaurant, buffet, and fast food.  When eating at a restaurant, ask that your food be prepared with less salt or no salt, if possible. What foods are recommended? The items listed may not be a complete list. Talk with your dietitian about   what dietary choices are best for you. Grains Whole-grain or whole-wheat bread. Whole-grain or whole-wheat pasta. Brown rice. Oatmeal. Quinoa. Bulgur. Whole-grain and low-sodium cereals. Pita bread. Low-fat, low-sodium crackers. Whole-wheat flour tortillas. Vegetables Fresh or frozen vegetables (raw, steamed, roasted, or grilled). Low-sodium or reduced-sodium tomato and vegetable juice. Low-sodium or reduced-sodium tomato sauce and tomato paste. Low-sodium or reduced-sodium canned vegetables. Fruits All fresh, dried, or frozen fruit. Canned fruit in natural juice (without  added sugar). Meat and other protein foods Skinless chicken or turkey. Ground chicken or turkey. Pork with fat trimmed off. Fish and seafood. Egg whites. Dried beans, peas, or lentils. Unsalted nuts, nut butters, and seeds. Unsalted canned beans. Lean cuts of beef with fat trimmed off. Low-sodium, lean deli meat. Dairy Low-fat (1%) or fat-free (skim) milk. Fat-free, low-fat, or reduced-fat cheeses. Nonfat, low-sodium ricotta or cottage cheese. Low-fat or nonfat yogurt. Low-fat, low-sodium cheese. Fats and oils Soft margarine without trans fats. Vegetable oil. Low-fat, reduced-fat, or light mayonnaise and salad dressings (reduced-sodium). Canola, safflower, olive, soybean, and sunflower oils. Avocado. Seasoning and other foods Herbs. Spices. Seasoning mixes without salt. Unsalted popcorn and pretzels. Fat-free sweets. What foods are not recommended? The items listed may not be a complete list. Talk with your dietitian about what dietary choices are best for you. Grains Baked goods made with fat, such as croissants, muffins, or some breads. Dry pasta or rice meal packs. Vegetables Creamed or fried vegetables. Vegetables in a cheese sauce. Regular canned vegetables (not low-sodium or reduced-sodium). Regular canned tomato sauce and paste (not low-sodium or reduced-sodium). Regular tomato and vegetable juice (not low-sodium or reduced-sodium). Pickles. Olives. Fruits Canned fruit in a light or heavy syrup. Fried fruit. Fruit in cream or butter sauce. Meat and other protein foods Fatty cuts of meat. Ribs. Fried meat. Bacon. Sausage. Bologna and other processed lunch meats. Salami. Fatback. Hotdogs. Bratwurst. Salted nuts and seeds. Canned beans with added salt. Canned or smoked fish. Whole eggs or egg yolks. Chicken or turkey with skin. Dairy Whole or 2% milk, cream, and half-and-half. Whole or full-fat cream cheese. Whole-fat or sweetened yogurt. Full-fat cheese. Nondairy creamers. Whipped toppings.  Processed cheese and cheese spreads. Fats and oils Butter. Stick margarine. Lard. Shortening. Ghee. Bacon fat. Tropical oils, such as coconut, palm kernel, or palm oil. Seasoning and other foods Salted popcorn and pretzels. Onion salt, garlic salt, seasoned salt, table salt, and sea salt. Worcestershire sauce. Tartar sauce. Barbecue sauce. Teriyaki sauce. Soy sauce, including reduced-sodium. Steak sauce. Canned and packaged gravies. Fish sauce. Oyster sauce. Cocktail sauce. Horseradish that you find on the shelf. Ketchup. Mustard. Meat flavorings and tenderizers. Bouillon cubes. Hot sauce and Tabasco sauce. Premade or packaged marinades. Premade or packaged taco seasonings. Relishes. Regular salad dressings. Where to find more information:  National Heart, Lung, and Blood Institute: www.nhlbi.nih.gov  American Heart Association: www.heart.org Summary  The DASH eating plan is a healthy eating plan that has been shown to reduce high blood pressure (hypertension). It may also reduce your risk for type 2 diabetes, heart disease, and stroke.  With the DASH eating plan, you should limit salt (sodium) intake to 2,300 mg a day. If you have hypertension, you may need to reduce your sodium intake to 1,500 mg a day.  When on the DASH eating plan, aim to eat more fresh fruits and vegetables, whole grains, lean proteins, low-fat dairy, and heart-healthy fats.  Work with your health care provider or diet and nutrition specialist (dietitian) to adjust your eating plan to your   individual calorie needs. This information is not intended to replace advice given to you by your health care provider. Make sure you discuss any questions you have with your health care provider. Document Revised: 10/22/2017 Document Reviewed: 11/02/2016 Elsevier Patient Education  2020 Elsevier Inc.  

## 2020-02-07 NOTE — Progress Notes (Signed)
Received secure message from Kelly in preop area at [facility].  She reported EKG had been shown to Dr. Ubaldo Glassing and no concern for septal infarct, NSR with PAC occasional noted.  Anesthesia reported no need for medical clearance.  Called patient on telephone and dicussed with them  They will call to cancel cardiology appointment and stated appreciation for call.

## 2020-02-07 NOTE — Assessment & Plan Note (Signed)
Chronic, ongoing.  Discussed at length with wife and him BEERS criteria and to avoid Benadryl due to anticholinergic effects.  Recommend switch to Melatonin, which is to take nightly.  Will send in script at this time, but recommend if unable to obtain script then get OTC in vitamin section.  Return to office in 6 months.

## 2020-02-07 NOTE — Addendum Note (Signed)
Addended by: Marnee Guarneri T on: 02/07/2020 05:08 PM   Modules accepted: Orders

## 2020-02-08 DIAGNOSIS — Z01810 Encounter for preprocedural cardiovascular examination: Secondary | ICD-10-CM | POA: Diagnosis not present

## 2020-02-08 DIAGNOSIS — E7800 Pure hypercholesterolemia, unspecified: Secondary | ICD-10-CM | POA: Diagnosis not present

## 2020-02-08 DIAGNOSIS — I10 Essential (primary) hypertension: Secondary | ICD-10-CM | POA: Diagnosis not present

## 2020-02-08 NOTE — Progress Notes (Signed)
Received a call from Dr. Theodore Demark office questioning whether pt needs cardiac clearance.  As per the previous note, no cardiac clearance needed after review of EKG by Dr. Ubaldo Glassing.  Tera Mater, MD

## 2020-02-09 ENCOUNTER — Inpatient Hospital Stay: Payer: PPO | Admitting: Anesthesiology

## 2020-02-09 ENCOUNTER — Inpatient Hospital Stay: Payer: PPO

## 2020-02-09 ENCOUNTER — Inpatient Hospital Stay
Admission: RE | Admit: 2020-02-09 | Discharge: 2020-02-12 | DRG: 470 | Disposition: A | Discharge status: Home Health | Payer: PPO | Attending: Orthopedic Surgery | Admitting: Orthopedic Surgery

## 2020-02-09 ENCOUNTER — Encounter
Admission: RE | Disposition: A | Discharge status: Home Health | Payer: Self-pay | Source: Home / Self Care | Attending: Orthopedic Surgery

## 2020-02-09 ENCOUNTER — Other Ambulatory Visit: Payer: Self-pay

## 2020-02-09 ENCOUNTER — Encounter: Payer: Self-pay | Admitting: Orthopedic Surgery

## 2020-02-09 DIAGNOSIS — E785 Hyperlipidemia, unspecified: Secondary | ICD-10-CM | POA: Diagnosis present

## 2020-02-09 DIAGNOSIS — M25762 Osteophyte, left knee: Secondary | ICD-10-CM | POA: Diagnosis present

## 2020-02-09 DIAGNOSIS — I10 Essential (primary) hypertension: Secondary | ICD-10-CM | POA: Diagnosis present

## 2020-02-09 DIAGNOSIS — Z8546 Personal history of malignant neoplasm of prostate: Secondary | ICD-10-CM

## 2020-02-09 DIAGNOSIS — Z96652 Presence of left artificial knee joint: Secondary | ICD-10-CM

## 2020-02-09 DIAGNOSIS — M1712 Unilateral primary osteoarthritis, left knee: Principal | ICD-10-CM | POA: Diagnosis present

## 2020-02-09 DIAGNOSIS — Z20822 Contact with and (suspected) exposure to covid-19: Secondary | ICD-10-CM | POA: Diagnosis present

## 2020-02-09 DIAGNOSIS — Z87442 Personal history of urinary calculi: Secondary | ICD-10-CM | POA: Diagnosis not present

## 2020-02-09 DIAGNOSIS — Z885 Allergy status to narcotic agent status: Secondary | ICD-10-CM | POA: Diagnosis not present

## 2020-02-09 DIAGNOSIS — G8918 Other acute postprocedural pain: Secondary | ICD-10-CM

## 2020-02-09 HISTORY — PX: TOTAL KNEE ARTHROPLASTY: SHX125

## 2020-02-09 LAB — CBC
HCT: 42.1 % (ref 39.0–52.0)
Hemoglobin: 14.2 g/dL (ref 13.0–17.0)
MCH: 30.9 pg (ref 26.0–34.0)
MCHC: 33.7 g/dL (ref 30.0–36.0)
MCV: 91.7 fL (ref 80.0–100.0)
Platelets: 211 10*3/uL (ref 150–400)
RBC: 4.59 MIL/uL (ref 4.22–5.81)
RDW: 13.2 % (ref 11.5–15.5)
WBC: 14.4 10*3/uL — ABNORMAL HIGH (ref 4.0–10.5)
nRBC: 0 % (ref 0.0–0.2)

## 2020-02-09 LAB — GLUCOSE, CAPILLARY: Glucose-Capillary: 106 mg/dL — ABNORMAL HIGH (ref 70–99)

## 2020-02-09 LAB — CREATININE, SERUM
Creatinine, Ser: 0.75 mg/dL (ref 0.61–1.24)
GFR calc Af Amer: >60 mL/min (ref >60)
GFR calc non Af Amer: >60 mL/min (ref >60)

## 2020-02-09 LAB — ABO/RH: ABO/RH(D): O POS

## 2020-02-09 IMAGING — DX DG KNEE 1-2V*L*
2 series · 2 of 2 positions shown · non-contrast
Comparison: None.

CLINICAL DATA: Status post total knee arthroplasty

EXAM:
LEFT KNEE - 1-2 VIEW

[knee ap]
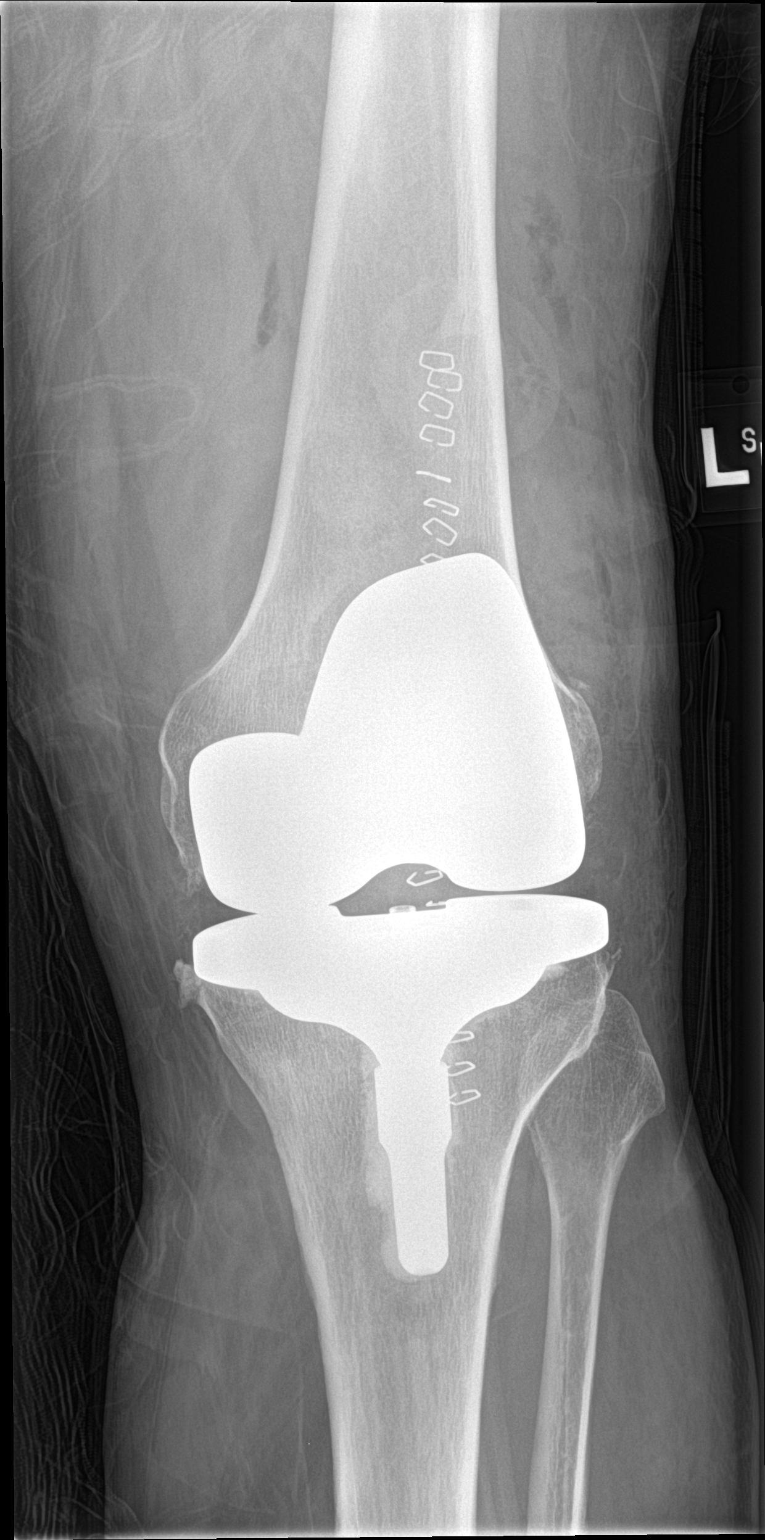

[knee lat]
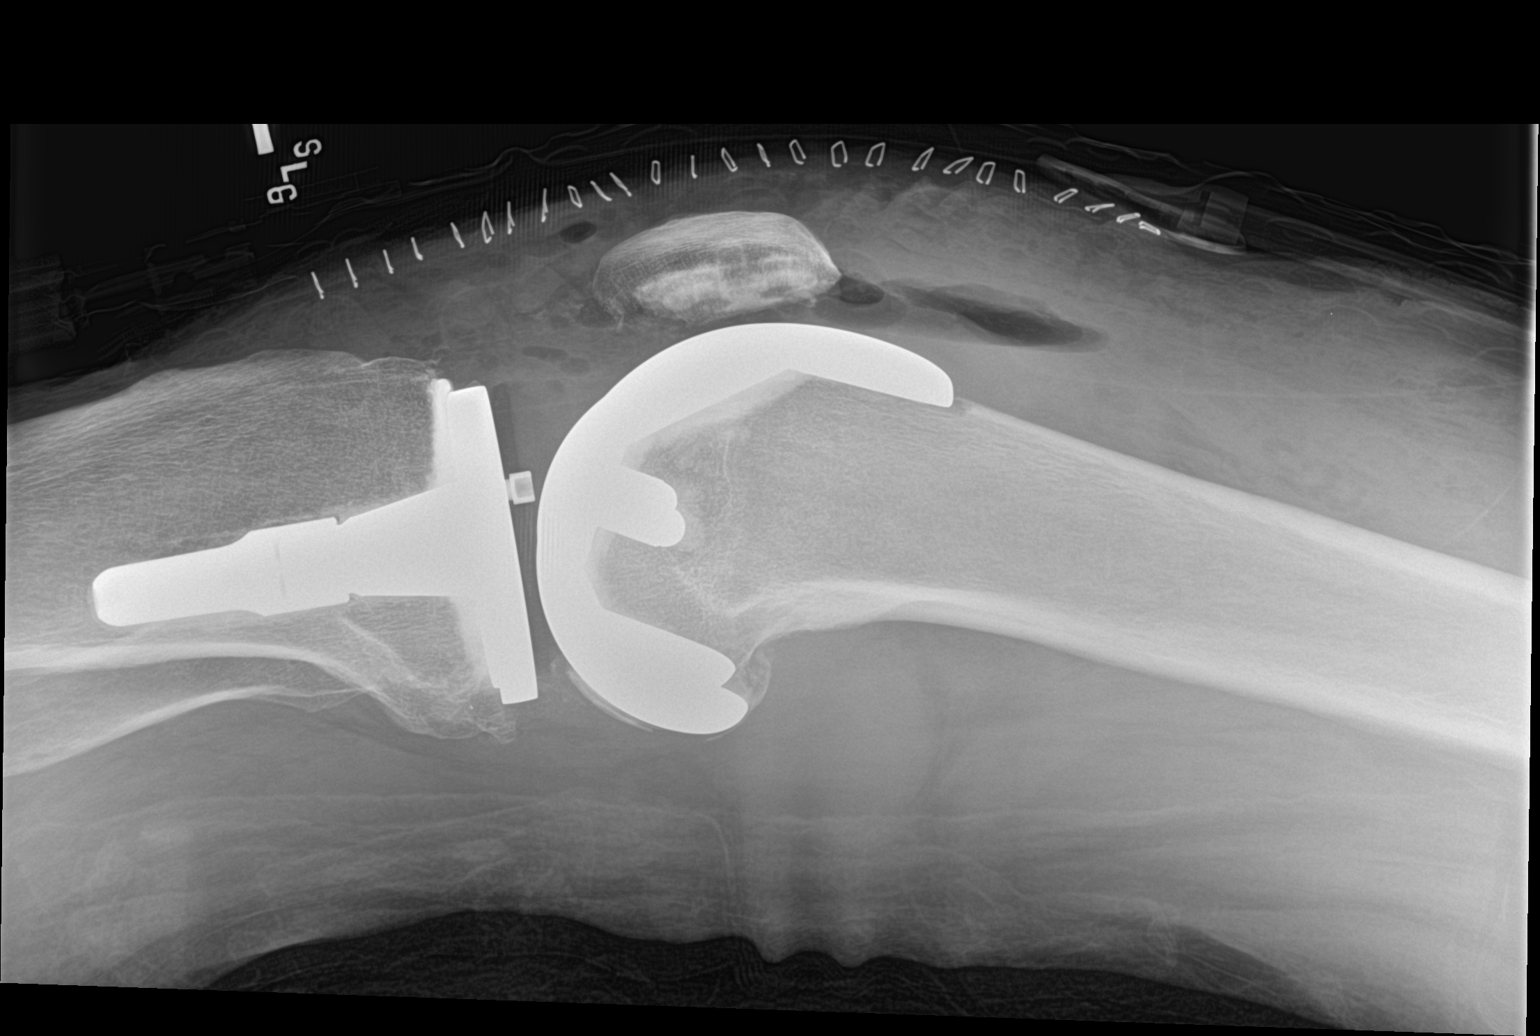

[2 of 2 positions shown; findings below may reference images not displayed]

FINDINGS: The patient is status post total knee arthroplasty on the left.
There are expected postsurgical changes including subcutaneous gas
and overlying soft tissue edema. Skin staples are noted. There is a
large joint effusion. The alignment appears near anatomic. There is
no evidence for immediate hardware fracture or failure.
IMPRESSION: Status post left total knee arthroplasty with expected postsurgical
changes.

## 2020-02-09 SURGERY — ARTHROPLASTY, KNEE, TOTAL
Anesthesia: Choice | Site: Knee | Laterality: Left

## 2020-02-09 MED ORDER — CEFAZOLIN SODIUM-DEXTROSE 2-4 GM/100ML-% IV SOLN
2.0000 g | INTRAVENOUS | Status: AC
  Administered 2020-02-09: 2 g via INTRAVENOUS

## 2020-02-09 MED ORDER — MORPHINE SULFATE 10 MG/ML IJ SOLN
INTRAMUSCULAR | Status: DC | PRN
  Administered 2020-02-09: 10 mg via INTRAMUSCULAR

## 2020-02-09 MED ORDER — BUPIVACAINE-EPINEPHRINE (PF) 0.25% -1:200000 IJ SOLN
INTRAMUSCULAR | Status: DC | PRN
  Administered 2020-02-09: 30 mL

## 2020-02-09 MED ORDER — ONDANSETRON HCL 4 MG/2ML IJ SOLN
4.0000 mg | Freq: Four times a day (QID) | INTRAMUSCULAR | Status: DC | PRN
  Administered 2020-02-09: 4 mg via INTRAVENOUS
  Filled 2020-02-09 (×2): qty 2

## 2020-02-09 MED ORDER — ADULT MULTIVITAMIN W/MINERALS CH
1.0000 | ORAL_TABLET | Freq: Every day | ORAL | Status: DC
  Administered 2020-02-10 – 2020-02-12 (×3): 1 via ORAL
  Filled 2020-02-09 (×3): qty 1

## 2020-02-09 MED ORDER — BUPIVACAINE IN DEXTROSE 0.75-8.25 % IT SOLN
INTRATHECAL | Status: DC | PRN
  Administered 2020-02-09: 1.6 mL via INTRATHECAL

## 2020-02-09 MED ORDER — FENTANYL CITRATE (PF) 100 MCG/2ML IJ SOLN: 25.0000 ug | INTRAMUSCULAR | Status: DC | PRN

## 2020-02-09 MED ORDER — METOCLOPRAMIDE HCL 10 MG PO TABS: 5.0000 mg | ORAL_TABLET | Freq: Three times a day (TID) | ORAL | Status: DC | PRN

## 2020-02-09 MED ORDER — MORPHINE SULFATE (PF) 10 MG/ML IV SOLN
INTRAVENOUS | Status: AC
  Filled 2020-02-09: qty 1

## 2020-02-09 MED ORDER — PANTOPRAZOLE SODIUM 40 MG PO TBEC
40.0000 mg | DELAYED_RELEASE_TABLET | Freq: Every day | ORAL | Status: DC
  Administered 2020-02-10 – 2020-02-12 (×3): 40 mg via ORAL
  Filled 2020-02-09 (×3): qty 1

## 2020-02-09 MED ORDER — GABAPENTIN 300 MG PO CAPS
300.0000 mg | ORAL_CAPSULE | Freq: Three times a day (TID) | ORAL | Status: DC
  Administered 2020-02-09 – 2020-02-12 (×8): 300 mg via ORAL
  Filled 2020-02-09 (×8): qty 1

## 2020-02-09 MED ORDER — BUPIVACAINE-EPINEPHRINE (PF) 0.25% -1:200000 IJ SOLN
INTRAMUSCULAR | Status: AC
  Filled 2020-02-09: qty 30

## 2020-02-09 MED ORDER — CEFAZOLIN SODIUM-DEXTROSE 2-4 GM/100ML-% IV SOLN
2.0000 g | Freq: Four times a day (QID) | INTRAVENOUS | Status: AC
  Administered 2020-02-09 – 2020-02-10 (×2): 2 g via INTRAVENOUS
  Filled 2020-02-09 (×2): qty 100

## 2020-02-09 MED ORDER — ONDANSETRON HCL 4 MG/2ML IJ SOLN
INTRAMUSCULAR | Status: AC
  Filled 2020-02-09: qty 2

## 2020-02-09 MED ORDER — DEXAMETHASONE SODIUM PHOSPHATE 10 MG/ML IJ SOLN
INTRAMUSCULAR | Status: DC | PRN
  Administered 2020-02-09: 10 mg via INTRAVENOUS

## 2020-02-09 MED ORDER — MIDAZOLAM HCL 5 MG/5ML IJ SOLN
INTRAMUSCULAR | Status: DC | PRN
  Administered 2020-02-09: 2 mg via INTRAVENOUS

## 2020-02-09 MED ORDER — ASPIRIN EC 81 MG PO TBEC
81.0000 mg | DELAYED_RELEASE_TABLET | Freq: Every day | ORAL | Status: DC
  Administered 2020-02-10 – 2020-02-12 (×3): 81 mg via ORAL
  Filled 2020-02-09 (×3): qty 1

## 2020-02-09 MED ORDER — ONDANSETRON HCL 4 MG PO TABS: 4.0000 mg | ORAL_TABLET | Freq: Four times a day (QID) | ORAL | Status: DC | PRN

## 2020-02-09 MED ORDER — COENZYME Q10 100 MG PO TABS: 100.0000 mg | ORAL_TABLET | Freq: Every day | ORAL | Status: DC

## 2020-02-09 MED ORDER — ONDANSETRON HCL 4 MG/2ML IJ SOLN
INTRAMUSCULAR | Status: DC | PRN
  Administered 2020-02-09: 4 mg via INTRAVENOUS

## 2020-02-09 MED ORDER — MENTHOL 3 MG MT LOZG
1.0000 | LOZENGE | OROMUCOSAL | Status: DC | PRN
  Filled 2020-02-09: qty 9

## 2020-02-09 MED ORDER — FENTANYL CITRATE (PF) 100 MCG/2ML IJ SOLN
INTRAMUSCULAR | Status: AC
  Filled 2020-02-09: qty 2

## 2020-02-09 MED ORDER — FAMOTIDINE 20 MG PO TABS: 20.0000 mg | ORAL_TABLET | Freq: Once | ORAL | Status: AC

## 2020-02-09 MED ORDER — DIPHENHYDRAMINE HCL 12.5 MG/5ML PO ELIX: 12.5000 mg | ORAL_SOLUTION | ORAL | Status: DC | PRN

## 2020-02-09 MED ORDER — DOCUSATE SODIUM 100 MG PO CAPS
100.0000 mg | ORAL_CAPSULE | Freq: Two times a day (BID) | ORAL | Status: DC
  Administered 2020-02-09 – 2020-02-12 (×6): 100 mg via ORAL
  Filled 2020-02-09 (×6): qty 1

## 2020-02-09 MED ORDER — ENOXAPARIN SODIUM 30 MG/0.3ML ~~LOC~~ SOLN
30.0000 mg | Freq: Two times a day (BID) | SUBCUTANEOUS | Status: DC
  Administered 2020-02-10 – 2020-02-12 (×5): 30 mg via SUBCUTANEOUS
  Filled 2020-02-09 (×5): qty 0.3

## 2020-02-09 MED ORDER — SODIUM CHLORIDE 0.9 % IV SOLN
INTRAVENOUS | Status: DC | PRN
  Administered 2020-02-09: 50 ug/min via INTRAVENOUS

## 2020-02-09 MED ORDER — NEOMYCIN-POLYMYXIN B GU 40-200000 IR SOLN
Status: DC | PRN
  Administered 2020-02-09: 20 mL

## 2020-02-09 MED ORDER — SODIUM CHLORIDE 0.9 % IV SOLN: INTRAVENOUS | Status: DC

## 2020-02-09 MED ORDER — ALUM & MAG HYDROXIDE-SIMETH 200-200-20 MG/5ML PO SUSP
30.0000 mL | ORAL | Status: DC | PRN
  Administered 2020-02-12: 30 mL via ORAL
  Filled 2020-02-09: qty 30

## 2020-02-09 MED ORDER — MAGNESIUM CITRATE PO SOLN
1.0000 | Freq: Once | ORAL | Status: DC | PRN
  Filled 2020-02-09: qty 296

## 2020-02-09 MED ORDER — PHENOL 1.4 % MT LIQD
1.0000 | OROMUCOSAL | Status: DC | PRN
  Filled 2020-02-09: qty 177

## 2020-02-09 MED ORDER — SIMVASTATIN 20 MG PO TABS
20.0000 mg | ORAL_TABLET | ORAL | Status: DC
  Administered 2020-02-09: 20 mg via ORAL
  Filled 2020-02-09: qty 1

## 2020-02-09 MED ORDER — MIDAZOLAM HCL 2 MG/2ML IJ SOLN
INTRAMUSCULAR | Status: AC
  Filled 2020-02-09: qty 2

## 2020-02-09 MED ORDER — PROPOFOL 500 MG/50ML IV EMUL
INTRAVENOUS | Status: AC
  Filled 2020-02-09: qty 50

## 2020-02-09 MED ORDER — KETOROLAC TROMETHAMINE 30 MG/ML IJ SOLN
INTRAMUSCULAR | Status: DC | PRN
  Administered 2020-02-09: 30 mg via INTRA_ARTICULAR

## 2020-02-09 MED ORDER — PROPOFOL 500 MG/50ML IV EMUL
INTRAVENOUS | Status: DC | PRN
  Administered 2020-02-09: 100 ug/kg/min via INTRAVENOUS

## 2020-02-09 MED ORDER — VITAMIN D3 25 MCG (1000 UNIT) PO TABS
2000.0000 [IU] | ORAL_TABLET | ORAL | Status: DC
  Administered 2020-02-12: 2000 [IU] via ORAL
  Filled 2020-02-09: qty 2

## 2020-02-09 MED ORDER — METHOCARBAMOL 500 MG PO TABS
500.0000 mg | ORAL_TABLET | Freq: Four times a day (QID) | ORAL | Status: DC | PRN
  Administered 2020-02-12: 500 mg via ORAL
  Filled 2020-02-09: qty 1

## 2020-02-09 MED ORDER — MEPERIDINE HCL 50 MG PO TABS: 50.0000 mg | ORAL_TABLET | ORAL | Status: DC | PRN

## 2020-02-09 MED ORDER — HYDROMORPHONE HCL 1 MG/ML IJ SOLN
0.5000 mg | INTRAMUSCULAR | Status: DC | PRN
  Administered 2020-02-09: 1 mg via INTRAVENOUS
  Filled 2020-02-09: qty 1

## 2020-02-09 MED ORDER — MELATONIN 5 MG PO TABS
10.0000 mg | ORAL_TABLET | Freq: Every day | ORAL | Status: DC
  Administered 2020-02-09 – 2020-02-11 (×3): 10 mg via ORAL
  Filled 2020-02-09 (×3): qty 2

## 2020-02-09 MED ORDER — FENTANYL CITRATE (PF) 100 MCG/2ML IJ SOLN
INTRAMUSCULAR | Status: DC | PRN
  Administered 2020-02-09: 50 ug via INTRAVENOUS
  Administered 2020-02-09 (×2): 25 ug via INTRAVENOUS

## 2020-02-09 MED ORDER — BUPIVACAINE LIPOSOME 1.3 % IJ SUSP
INTRAMUSCULAR | Status: AC
  Filled 2020-02-09: qty 20

## 2020-02-09 MED ORDER — FAMOTIDINE 20 MG PO TABS
ORAL_TABLET | ORAL | Status: AC
  Administered 2020-02-09: 20 mg via ORAL
  Filled 2020-02-09: qty 1

## 2020-02-09 MED ORDER — PROPOFOL 10 MG/ML IV BOLUS
INTRAVENOUS | Status: AC
  Filled 2020-02-09: qty 40

## 2020-02-09 MED ORDER — BISACODYL 10 MG RE SUPP
10.0000 mg | Freq: Every day | RECTAL | Status: DC | PRN
  Filled 2020-02-09: qty 1

## 2020-02-09 MED ORDER — LISINOPRIL 10 MG PO TABS: 10.0000 mg | ORAL_TABLET | ORAL | Status: DC

## 2020-02-09 MED ORDER — SODIUM CHLORIDE (PF) 0.9 % IJ SOLN
INTRAMUSCULAR | Status: AC
  Filled 2020-02-09: qty 50

## 2020-02-09 MED ORDER — CEFAZOLIN SODIUM-DEXTROSE 2-4 GM/100ML-% IV SOLN
INTRAVENOUS | Status: AC
  Filled 2020-02-09: qty 100

## 2020-02-09 MED ORDER — ACETAMINOPHEN 10 MG/ML IV SOLN: 1000.0000 mg | Freq: Once | INTRAVENOUS | Status: DC | PRN

## 2020-02-09 MED ORDER — ONDANSETRON HCL 4 MG/2ML IJ SOLN
4.0000 mg | Freq: Once | INTRAMUSCULAR | Status: AC | PRN
  Administered 2020-02-09: 4 mg via INTRAVENOUS

## 2020-02-09 MED ORDER — SODIUM CHLORIDE 0.9 % IV SOLN
INTRAVENOUS | Status: DC | PRN
  Administered 2020-02-09: 60 mL

## 2020-02-09 MED ORDER — METOCLOPRAMIDE HCL 5 MG/ML IJ SOLN
5.0000 mg | Freq: Three times a day (TID) | INTRAMUSCULAR | Status: DC | PRN
  Administered 2020-02-10: 10 mg via INTRAVENOUS
  Filled 2020-02-09: qty 2

## 2020-02-09 MED ORDER — LACTATED RINGERS IV SOLN: INTRAVENOUS | Status: DC

## 2020-02-09 MED ORDER — ZOLPIDEM TARTRATE 5 MG PO TABS: 5.0000 mg | ORAL_TABLET | Freq: Every evening | ORAL | Status: DC | PRN

## 2020-02-09 MED ORDER — ACETAMINOPHEN 500 MG PO TABS
1000.0000 mg | ORAL_TABLET | Freq: Four times a day (QID) | ORAL | Status: AC
  Administered 2020-02-09 – 2020-02-10 (×4): 1000 mg via ORAL
  Filled 2020-02-09 (×4): qty 2

## 2020-02-09 MED ORDER — FLUTICASONE PROPIONATE 50 MCG/ACT NA SUSP
1.0000 | Freq: Every day | NASAL | Status: DC
  Administered 2020-02-10 – 2020-02-12 (×3): 1 via NASAL
  Filled 2020-02-09: qty 16

## 2020-02-09 MED ORDER — KETOROLAC TROMETHAMINE 30 MG/ML IJ SOLN
INTRAMUSCULAR | Status: AC
  Filled 2020-02-09: qty 1

## 2020-02-09 MED ORDER — METHOCARBAMOL 1000 MG/10ML IJ SOLN
500.0000 mg | Freq: Four times a day (QID) | INTRAVENOUS | Status: DC | PRN
  Filled 2020-02-09: qty 5

## 2020-02-09 MED ORDER — NEOMYCIN-POLYMYXIN B GU 40-200000 IR SOLN
Status: AC
  Filled 2020-02-09: qty 40

## 2020-02-09 MED ORDER — MAGNESIUM HYDROXIDE 400 MG/5ML PO SUSP
30.0000 mL | Freq: Every day | ORAL | Status: DC | PRN
  Administered 2020-02-10 – 2020-02-11 (×2): 30 mL via ORAL
  Filled 2020-02-09 (×2): qty 30

## 2020-02-09 MED ORDER — CHLORHEXIDINE GLUCONATE 4 % EX LIQD: 60.0000 mL | Freq: Once | CUTANEOUS | Status: DC

## 2020-02-09 SURGICAL SUPPLY — 73 items
BLADE SAGITTAL 25.0X1.19X90 (BLADE) ×2 IMPLANT
BLADE SAGITTAL 25.0X1.19X90MM (BLADE) ×1
BLOCK CUTTING FEMUR SZ6 LEFT (MISCELLANEOUS) ×2 IMPLANT
BLOCK CUTTING TIBIAL 6 LT (MISCELLANEOUS) ×2 IMPLANT
BNDG ELASTIC 6X5.8 VLCR STR LF (GAUZE/BANDAGES/DRESSINGS) ×3 IMPLANT
CANISTER SUCT 1200ML W/VALVE (MISCELLANEOUS) ×3 IMPLANT
CANISTER SUCT 3000ML PPV (MISCELLANEOUS) ×6 IMPLANT
CANISTER WOUND CARE 500ML ATS (WOUND CARE) ×3 IMPLANT
CEMENT HV SMART SET (Cement) ×4 IMPLANT
CHLORAPREP W/TINT 26 (MISCELLANEOUS) ×6 IMPLANT
COOLER POLAR GLACIER W/PUMP (MISCELLANEOUS) ×3 IMPLANT
COVER WAND RF STERILE (DRAPES) ×3 IMPLANT
CUFF TOURN SGL QUICK 24 (TOURNIQUET CUFF)
CUFF TOURN SGL QUICK 30 (TOURNIQUET CUFF)
CUFF TRNQT CYL 24X4X16.5-23 (TOURNIQUET CUFF) IMPLANT
CUFF TRNQT CYL 30X4X21-28X (TOURNIQUET CUFF) IMPLANT
DRAPE 3/4 80X56 (DRAPES) ×6 IMPLANT
ELECT CAUTERY BLADE 6.4 (BLADE) ×3 IMPLANT
ELECT REM PT RETURN 9FT ADLT (ELECTROSURGICAL) ×3
ELECTRODE REM PT RTRN 9FT ADLT (ELECTROSURGICAL) ×1 IMPLANT
FEMORAL COMP SZ6P LEFT (Femur) ×2 IMPLANT
FEMUR BONE MODEL 4.9010 MEDACT (MISCELLANEOUS) ×2 IMPLANT
GAUZE SPONGE 4X4 12PLY STRL (GAUZE/BANDAGES/DRESSINGS) ×3 IMPLANT
GAUZE XEROFORM 1X8 LF (GAUZE/BANDAGES/DRESSINGS) ×3 IMPLANT
GLOVE BIOGEL PI IND STRL 9 (GLOVE) ×1 IMPLANT
GLOVE BIOGEL PI INDICATOR 9 (GLOVE) ×2
GLOVE INDICATOR 8.0 STRL GRN (GLOVE) ×3 IMPLANT
GLOVE SURG ORTHO 8.0 STRL STRW (GLOVE) ×3 IMPLANT
GLOVE SURG SYN 9.0  PF PI (GLOVE) ×2
GLOVE SURG SYN 9.0 PF PI (GLOVE) ×1 IMPLANT
GOWN SRG 2XL LVL 4 RGLN SLV (GOWNS) ×1 IMPLANT
GOWN STRL NON-REIN 2XL LVL4 (GOWNS) ×2
GOWN STRL REUS W/ TWL LRG LVL3 (GOWN DISPOSABLE) ×1 IMPLANT
GOWN STRL REUS W/ TWL XL LVL3 (GOWN DISPOSABLE) ×1 IMPLANT
GOWN STRL REUS W/TWL LRG LVL3 (GOWN DISPOSABLE) ×2
GOWN STRL REUS W/TWL XL LVL3 (GOWN DISPOSABLE) ×2
HOLDER FOLEY CATH W/STRAP (MISCELLANEOUS) ×3 IMPLANT
HOOD PEEL AWAY FLYTE STAYCOOL (MISCELLANEOUS) ×6 IMPLANT
INSERT TIBIAL FIXED SZ6 LEFT (Insert) ×2 IMPLANT
KIT PREVENA INCISION MGT20CM45 (CANNISTER) ×5 IMPLANT
KIT TURNOVER KIT A (KITS) ×3 IMPLANT
NDL SAFETY ECLIPSE 18X1.5 (NEEDLE) ×1 IMPLANT
NDL SPNL 18GX3.5 QUINCKE PK (NEEDLE) ×1 IMPLANT
NDL SPNL 20GX3.5 QUINCKE YW (NEEDLE) ×1 IMPLANT
NEEDLE HYPO 18GX1.5 SHARP (NEEDLE) ×2
NEEDLE SPNL 18GX3.5 QUINCKE PK (NEEDLE) ×3 IMPLANT
NEEDLE SPNL 20GX3.5 QUINCKE YW (NEEDLE) ×3 IMPLANT
NS IRRIG 1000ML POUR BTL (IV SOLUTION) ×3 IMPLANT
PACK TOTAL KNEE (MISCELLANEOUS) ×3 IMPLANT
PAD WRAPON POLAR KNEE (MISCELLANEOUS) ×1 IMPLANT
PATELLA SZ4 CEMENTED (Joint) ×2 IMPLANT
PENCIL SMOKE EVACUATOR COATED (MISCELLANEOUS) ×3 IMPLANT
PULSAVAC PLUS IRRIG FAN TIP (DISPOSABLE) ×3
SCALPEL PROTECTED #10 DISP (BLADE) ×6 IMPLANT
SOL .9 NS 3000ML IRR  AL (IV SOLUTION) ×2
SOL .9 NS 3000ML IRR UROMATIC (IV SOLUTION) ×1 IMPLANT
STAPLER SKIN PROX 35W (STAPLE) ×3 IMPLANT
STEM EXTENSION 11MMX30MM (Stem) ×2 IMPLANT
SUCTION FRAZIER HANDLE 10FR (MISCELLANEOUS) ×2
SUCTION TUBE FRAZIER 10FR DISP (MISCELLANEOUS) ×1 IMPLANT
SUT DVC 2 QUILL PDO  T11 36X36 (SUTURE) ×2
SUT DVC 2 QUILL PDO T11 36X36 (SUTURE) ×1 IMPLANT
SUT ETHIBOND 2 V 37 (SUTURE) IMPLANT
SUT V-LOC 90 ABS DVC 3-0 CL (SUTURE) ×3 IMPLANT
SYR 20ML LL LF (SYRINGE) ×3 IMPLANT
SYR 50ML LL SCALE MARK (SYRINGE) ×6 IMPLANT
TIB TRAY FIXED SZ6 LEFT (Joint) ×2 IMPLANT
TIBIAL BONE MODEL LEFT (MISCELLANEOUS) ×2 IMPLANT
TIP FAN IRRIG PULSAVAC PLUS (DISPOSABLE) ×1 IMPLANT
TOWEL OR 17X26 4PK STRL BLUE (TOWEL DISPOSABLE) ×3 IMPLANT
TOWER CARTRIDGE SMART MIX (DISPOSABLE) ×3 IMPLANT
TRAY FOLEY MTR SLVR 16FR STAT (SET/KITS/TRAYS/PACK) ×3 IMPLANT
WRAPON POLAR PAD KNEE (MISCELLANEOUS) ×3

## 2020-02-09 NOTE — Anesthesia Procedure Notes (Signed)
Spinal  Patient location during procedure: OR Start time: 02/09/2020 1:34 PM End time: 02/09/2020 1:38 PM Staffing Performed: anesthesiologist  Anesthesiologist: Arita Miss, MD Preanesthetic Checklist Completed: patient identified, IV checked, site marked, risks and benefits discussed, surgical consent, monitors and equipment checked, pre-op evaluation and timeout performed Spinal Block Patient position: sitting Prep: ChloraPrep Patient monitoring: heart rate, continuous pulse ox, blood pressure and cardiac monitor Approach: midline Location: L3-4 Injection technique: single-shot Needle Needle type: Whitacre and Introducer  Needle gauge: 24 G Needle length: 9 cm Assessment Sensory level: T10 Additional Notes Negative paresthesia. Negative blood return. Positive free-flowing CSF. Expiration date of kit checked and confirmed. Patient tolerated procedure well, without complications.

## 2020-02-09 NOTE — Transfer of Care (Signed)
Immediate Anesthesia Transfer of Care Note  Patient: Christopher Huang  Procedure(s) Performed: Procedure(s): TOTAL KNEE ARTHROPLASTY - RNFA (Left)  Patient Location: PACU  Anesthesia Type:Spinal  Level of Consciousness: awake, alert  and oriented  Airway & Oxygen Therapy: Patient Spontanous Breathing and Patient connected to face mask oxygen  Post-op Assessment: Report given to RN and Post -op Vital signs reviewed and stable  Post vital signs: Reviewed and stable  Last Vitals:  Vitals:   02/09/20 1039 02/09/20 1607  BP: (!) 148/76 110/60  Pulse: 71 84  Resp: 18 11  Temp: (!) 36.1 C 36.8 C  SpO2: [id number] [id number]    Complications: No apparent anesthesia complications

## 2020-02-09 NOTE — Op Note (Signed)
02/09/2020  4:14 PM  PATIENT:  Christopher Huang  69 y.o. male  PRE-OPERATIVE DIAGNOSIS:  PRIMARY OSTEOARTHRITIS OF LEFT KNEE  POST-OPERATIVE DIAGNOSIS:  PRIMARY OSTEOARTHRITIS OF LEFT KNEE  PROCEDURE:  Procedure(s): TOTAL KNEE ARTHROPLASTY - RNFA (Left)  SURGEON: Laurene Footman, MD  ASSISTANTS: RNFA  ANESTHESIA:   spinal  EBL:  Total I/O In: 900 [I.V.:900] Out: 200 [Urine:100; Blood:100]  BLOOD ADMINISTERED:none  DRAINS: Incisional wound VAC   LOCAL MEDICATIONS USED:  MARCAINE    and OTHER Exparel Toradol and morphine  SPECIMEN:  No Specimen  DISPOSITION OF SPECIMEN:  N/A  COUNTS:  YES  TOURNIQUET:  90 min at 300 mg Hg  IMPLANTS: Medacta GMK Sphere 6+ left femur, 6 left tibia with short stem, 10 mm insert and size 4 patella, all components cemented  DICTATION: .Dragon Dictation  patient was brought to the operating room and spinal anesthesia was obtained. After prepping and draping the left leg in sterile fashion, andafter patient identification and timeout procedures were completed,tourniquet was raised andmidline skin incision was made followed by medial parapatellar arthrotomywith bone-on-bone medial compartment osteoarthritis advanced patellofemoral arthritis andseverelateral compartment arthritis,partial synovectomy was also carried out. There appeared to be extensive chondrocalcinosis throughout the knee as wellthe ACL and fat pad were excised off the anterior into the meniscus in the proximal tibia cutting guide from the [facility] was appliedand theproximal tibia cut carried out. The distal femoral cut was carried out in a similar fashion there is a moderate flexion contracture and varus deformity and after the initial cuts there was  inadequate space for the space gauge and so the  distal femur was recut.  This allowed for adequate flexion and extension gaps The6+femoralcutting guide applied with anterior posterior and chamfer cuts made. Large  posterior osteophytes were removed at this time with the aid of a angled osteotome and curette the posterior horns of the menisci were removed at this point.Injection of the above medication was carried out after the femoral and tibial cuts were carried out. The6 insert baseplate trial was placed pinned into position and proximal tibial preparation carried out with drilling hand reaming and the keel punch followed by placement of the6+femur and sizing the tibial insert size34millimetergave the best fit with stability and full extension. The distal femoral drill holes were made in the notch cut for the trochlear groove was then carried out with trials were then removed the patella was cut using the patellar cutting guide and it sized to a size4afterdrill holes have been made The knee was irrigated with pulsatile lavage and the bony surfaces dried the tibial component was cemented into place first. Excess cement was removed and the polyethylene insert placed with a torque screw placed with a torque screwdriver tightened. The distal femoral component was placed and the knee was held in extension as the patellar button was clamped into place. After the cement was set,excess cement was removed and the knee was again irrigated thoroughly thoroughly irrigated. The tourniquet was let down and hemostasis checkedwithelectrocautery.The arthrotomy was repaired witha heavy Quill suture, followed by 3-0 V lock subcuticular closure,skin staplesfollowed byincisional wound VAC and Polar Care.Marland Kitchen  PLAN OF CARE: Admit to inpatient   PATIENT DISPOSITION:  PACU - hemodynamically stable.

## 2020-02-09 NOTE — Anesthesia Procedure Notes (Signed)
Spinal

## 2020-02-09 NOTE — H&P (Signed)
Christopher Huang, [place] - 02/02/2020 9:45 AM EST Formatting of this note might be different from the original. Chief Complaint  Patient presents with  . Left Knee - Pre-op Exam   History of Present Illness: Christopher Huang is a 69 y.o. male who comes to clinic today for history and physical for left total knee arthroplasty with Dr. Hessie Knows on 02/09/2020. Patient said pain for years, over the last 2 years pains been increasing along the medial compartment knee is noticed varus deformity to the knee. He wears a knee sleeve, has had cortisone injections with only a few weeks worth relief. His pain is interfering with activities of daily living and quality of life. Unable to walk 200 feet without severe pain and having to sit out.  He denies any history of blood clot. He denies any major medical issues.   Past Medical History: Past Medical History:  Diagnosis Date  . Hypertension  . Osteoarthritis  . Prostate cancer (CMS-HCC)   Past Surgical History: Past Surgical History:  Procedure Laterality Date  . KNEE ARTHROSCOPY  . PROSTATECTOMY RETROPUBIC  . TONSILLECTOMY   Past Family History: History reviewed. No pertinent family history.  Medications: Current Outpatient Medications Ordered in Epic  Medication Sig Dispense Refill  . aspirin 81 MG EC tablet Take 81 mg by mouth once daily.  . chlorhexidine (PERIDEX) 0.12 % solution SWISH WITH 1/2 OZ FOR 30 SECONDS, THEN SPIT, TWO TIMES DAILY  . fluticasone (VERAMYST) 27.5 mcg/actuation nasal spray Place 2 sprays into both nostrils once daily.  Marland Kitchen glucosamine-chondroit-vit C-Mn 500-400 mg Cap Take by mouth.  Marland Kitchen lisinopril (PRINIVIL,ZESTRIL) 10 MG tablet Take 10 mg by mouth once daily.  . meloxicam (MOBIC) 15 MG tablet Take 15 mg by mouth once daily.  . multivit-min/ferrous fumarate (MULTI VITAMIN ORAL) Multi Vitamin  . simvastatin (ZOCOR) 20 MG tablet Take 20 mg by mouth nightly.  . ubidecarenone (CO Q-10 ORAL) Natural Co-Q10    No current Epic-ordered facility-administered medications on file.   Allergies: Allergies  Allergen Reactions  . Hydrocodone-Acetaminophen Nausea and Nausea And Vomiting  . Oxycodone Nausea  . Oxycodone-Acetaminophen Nausea and Nausea And Vomiting  . Tramadol Nausea and Nausea And Vomiting    Body mass index is 27.99 kg/m.  Review of Systems:  A comprehensive 14 point ROS was performed, reviewed, and the pertinent orthopaedic findings are documented in the HPI.  Physical Exam: General:  Well developed, well nourished, no apparent distress, normal affect, normal gait with no antalgic component.   HEENT: Head normocephalic, atraumatic, PERRL.   Abdomen: Soft, non tender, non distended, Bowel sounds present.  Heart: Examination of the heart reveals regular, rate, and rhythm. There is no murmur noted on ascultation. There is a normal apical pulse.  Lungs: Lungs are clear to auscultation. There is no wheeze, rhonchi, or crackles. There is normal expansion of bilateral chest walls.  Vitals:  02/02/20 0943  BP: 128/86   Orthopaedic Examination: Right Left   Gait Antalgic  Alignment Normal  Inspection Normal Normal  Palpation Knee Normal Normal  Range of Motion Knee Normal Normal  Strength Normal Quadriceps weakness  Meniscus Exam Normal Normal  Ligament Exam Normal Normal  Patella Exam Normal Normal  Reflexes Normal Normal  Neurologic Normal Normal   Imaging Studies: The patient had left knee prior reviewed by me from 12/07/2018 showed severe medial and patellofemoral degenerative change with the lateral compartment also involved. There is significant sclerosis and bone loss of the medial compartment. Extensive posterior  osteophytes on the tibia are noted. There is severe patellofemoral degenerative change with marked osteophyte formation and loss of articular cartilage.   Assessment:  ICD-10-CM  1. Primary osteoarthritis of left knee M17.12    Plan: 1. Risks,  benefits, complications of a left total knee arthroplasty have been discussed with the patient. Patient has agreed and consented to the procedure with Dr. Hessie Knows on 02/09/2020.   Electronically signed by Christopher Gottron, PA at 02/02/2020 11:05 AM EST   Reviewed paper H+P, will be scanned into chart. No changes noted.

## 2020-02-09 NOTE — Anesthesia Preprocedure Evaluation (Signed)
Anesthesia Evaluation  Patient identified by MRN, date of birth, ID band Patient awake    Reviewed: Allergy & Precautions, NPO status , Patient's Chart, lab work & pertinent test results  History of Anesthesia Complications Negative for: history of anesthetic complications  Airway Mallampati: II  TM Distance: >3 FB Neck ROM: Full    Dental no notable dental hx. (+) Teeth Intact   Pulmonary neg pulmonary ROS, neg sleep apnea, neg COPD, Patient abstained from smoking.Not current smoker,    Pulmonary exam normal breath sounds clear to auscultation       Cardiovascular Exercise Tolerance: Good METShypertension, Pt. on medications (-) CAD and (-) Past MI negative cardio ROS  (-) dysrhythmias  Rhythm:Regular Rate:Normal - Systolic murmurs    Neuro/Psych negative neurological ROS  negative psych ROS   GI/Hepatic neg GERD  ,(+)     (-) substance abuse  ,   Endo/Other  neg diabetes  Renal/GU negative Renal ROS     Musculoskeletal  (+) Arthritis , Osteoarthritis,    Abdominal   Peds  Hematology   Anesthesia Other Findings Past Medical History: No date: Allergic rhinitis 4/08: Cancer of prostate ([place])     Comment:  s/p surgery No date: History of kidney stones     Comment:  H/O No date: Hyperlipidemia No date: Hypertension No date: Personal history of kidney stones  Reproductive/Obstetrics                             Anesthesia Physical Anesthesia Plan  ASA: II  Anesthesia Plan: General/Spinal   Post-op Pain Management:    Induction: Intravenous  PONV Risk Score and Plan: 3 and Ondansetron, Dexamethasone, Propofol infusion, TIVA and Midazolam  Airway Management Planned: Natural Airway  Additional Equipment:   Intra-op Plan:   Post-operative Plan:   Informed Consent: I have reviewed the patients History and Physical, chart, labs and discussed the procedure including the  risks, benefits and alternatives for the proposed anesthesia with the patient or authorized representative who has indicated his/her understanding and acceptance.       Plan Discussed with: CRNA and Surgeon  Anesthesia Plan Comments: (Discussed R/B/A of neuraxial anesthesia technique with patient: - rare risks of spinal/epidural hematoma, nerve damage, infection - Risk of PDPH - Risk of nausea and vomiting - Risk of conversion to general anesthesia and its associated risks, including sore throat, damage to lips/teeth/oropharynx, and rare risks such as cardiac and respiratory events.  Patient voiced understanding.)        Anesthesia Quick Evaluation

## 2020-02-09 NOTE — Progress Notes (Signed)
PHARMACIST - PHYSICIAN ORDER COMMUNICATION  CONCERNING: P&T Medication Policy on Herbal Medications  DESCRIPTION:  This patient's order for:  Coenzyme Q 10  has been noted.  This product(s) is classified as an "herbal" or natural product. Due to a lack of definitive safety studies or FDA approval, nonstandard manufacturing practices, plus the potential risk of unknown drug-drug interactions while on inpatient medications, the Pharmacy and Therapeutics Committee does not permit the use of "herbal" or natural products of this type within [facility].   ACTION TAKEN: The pharmacy department is unable to verify this order at this time. Please reevaluate patient's clinical condition at discharge and address if the herbal or natural product(s) should be resumed at that time.  Paulina Fusi, PharmD, BCPS 02/09/2020 6:54 PM

## 2020-02-10 LAB — CBC
HCT: 36.8 % — ABNORMAL LOW (ref 39.0–52.0)
Hemoglobin: 12.3 g/dL — ABNORMAL LOW (ref 13.0–17.0)
MCH: 30.4 pg (ref 26.0–34.0)
MCHC: 33.4 g/dL (ref 30.0–36.0)
MCV: 90.9 fL (ref 80.0–100.0)
Platelets: 212 10*3/uL (ref 150–400)
RBC: 4.05 MIL/uL — ABNORMAL LOW (ref 4.22–5.81)
RDW: 13.2 % (ref 11.5–15.5)
WBC: 11.2 10*3/uL — ABNORMAL HIGH (ref 4.0–10.5)
nRBC: 0 % (ref 0.0–0.2)

## 2020-02-10 LAB — BASIC METABOLIC PANEL
Anion gap: 7 (ref 5–15)
BUN: 20 mg/dL (ref 8–23)
CO2: 27 mmol/L (ref 22–32)
Calcium: 8.3 mg/dL — ABNORMAL LOW (ref 8.9–10.3)
Chloride: 101 mmol/L (ref 98–111)
Creatinine, Ser: 0.73 mg/dL (ref 0.61–1.24)
GFR calc Af Amer: >60 mL/min (ref >60)
GFR calc non Af Amer: >60 mL/min (ref >60)
Glucose, Bld: 176 mg/dL — ABNORMAL HIGH (ref 70–99)
Potassium: 4.2 mmol/L (ref 3.5–5.1)
Sodium: 135 mmol/L (ref 135–145)

## 2020-02-10 MED ORDER — CHLORHEXIDINE GLUCONATE CLOTH 2 % EX PADS
6.0000 | MEDICATED_PAD | Freq: Every day | CUTANEOUS | Status: DC
  Administered 2020-02-10 – 2020-02-11 (×2): 6 via TOPICAL

## 2020-02-10 NOTE — Plan of Care (Signed)
Pt was inquiring about when he will be able to ambulate, Pt informed that nursing can assist with ambulation only after receiving clearance from PT and/or otho MD. Verbalized understanding. He wants to be able to walk as before.

## 2020-02-10 NOTE — TOC Progression Note (Signed)
Transition of Care [facility]) - Progression Note    Patient Details  Name: Christopher Huang MRN: [id number] Date of Birth: 01-16-1951  Transition of Care [facility]) CM/SW Contact  Boris Sharper, [place] Phone [phone] 02/10/2020, 3:44 PM  Clinical Narrative:    Drue Novel from [facility] called me back and notified me that [name] is a pt with them and was sent to them preop. [facility] to follow.     Expected Discharge Plan: [place] Barriers to Discharge: Continued Medical Work up  Expected Discharge Plan and Services Expected Discharge Plan: [place] Choice: [place] arrangements for the past 2 months: Single Family Home                                       Social Determinants of Health (SDOH) Interventions    Readmission Risk Interventions No flowsheet data found.

## 2020-02-10 NOTE — Progress Notes (Signed)
Physical Therapy Treatment Patient Details Name: Christopher Huang MRN: [id number] DOB: 1951-10-10 Today's Date: 02/10/2020    History of Present Illness Christopher Huang is a 69 y.o. male s/p L TKA (3/19, Dr. Rudene Christians) with PMH: HTN, prostate cancer, kidney stones. Patient said pain for years, over the last 2 years pains been increasing along the medial compartment knee is noticed varus deformity to the knee. He wears a knee sleeve, has had cortisone injections with only a few weeks worth relief. His pain is interfering with activities of daily living and quality of life. Unable to walk 200 feet without pain.    PT Comments    Pt was supine in bed upon arriving. He agrees to PT session and reports feeling much better than this morning. He was able to exit R side of bed without assistance and ambulated one lap in hallway ~ 160 ft with RW +supervision. NO LOB or unsteadiness noted. Pt is progressing well with PT and new recommendation for DC to home with HHPT to follow.    Follow Up Recommendations  Home health PT     Equipment Recommendations  3in1 (PT)    Recommendations for Other Services       Precautions / Restrictions Precautions Precautions: Fall Required Braces or Orthoses: (able to do 10 leg lifts) Restrictions Weight Bearing Restrictions: Yes LLE Weight Bearing: Weight bearing as tolerated    Mobility  Bed Mobility Overal bed mobility: Independent             General bed mobility comments: pt sitting EOB upon arrival and can - during sesssion - sit<> supine I - leg lifts 10 independent  Transfers Overall transfer level: Modified independent Equipment used: Rolling walker (2 wheeled)             General transfer comment: no physical assistance required to stand from EOb or recliner  Ambulation/Gait Ambulation/Gait assistance: Supervision Gait Distance (Feet): 160 Feet Assistive device: Rolling walker (2 wheeled) Gait Pattern/deviations: WFL(Within Functional  Limits) Gait velocity: decreased   General Gait Details: Pt demonstrated safe ability to ambulate 160 ft with RW + supervision   Stairs             Wheelchair Mobility    Modified Rankin (Stroke Patients Only)       Balance                                            Cognition Arousal/Alertness: Awake/alert Behavior During Therapy: WFL for tasks assessed/performed Overall Cognitive Status: Within Functional Limits for tasks assessed                                        Exercises Other Exercises Other Exercises: Review with pt and husband some home safety and making sure to decrease fall risk - picking up loose ruggs, night light, LB dressing, not to  carry objects in hands with walker use- non slip shoes ,  LB dressing review with pt    General Comments        Pertinent Vitals/Pain Pain Assessment: No/denies pain    Home Living Family/patient expects to be discharged to:: Private residence Living Arrangements: Spouse/significant other Available Help at Discharge: Family;Available 24 hours/day Type of Home: House Home Access: Stairs to enter   Home  Layout: One level Home Equipment: Walker - 2 wheels;Cane - single point Additional Comments: lives with wife, available 24/7    Prior Function Level of Independence: Independent      Comments: loves to fish and hunt   PT Goals (current goals can now be found in the care plan section) Acute Rehab PT Goals Patient Stated Goal: go home and want to fish/hunt again Progress towards PT goals: Progressing toward goals    Frequency    BID      PT Plan Current plan remains appropriate    Co-evaluation              AM-PAC PT "6 Clicks" Mobility   Outcome Measure  Help needed turning from your back to your side while in a flat bed without using bedrails?: None Help needed moving from lying on your back to sitting on the side of a flat bed without using bedrails?:  None Help needed moving to and from a bed to a chair (including a wheelchair)?: A Little Help needed standing up from a chair using your arms (e.g., wheelchair or bedside chair)?: A Little Help needed to walk in hospital room?: A Little Help needed climbing 3-5 steps with a railing? : A Little 6 Click Score: 20    End of Session Equipment Utilized During Treatment: Gait belt Activity Tolerance: Patient tolerated treatment well;Other (comment) Patient left: in chair;with call bell/phone within reach;with chair alarm set Nurse Communication: Mobility status PT Visit Diagnosis: Unsteadiness on feet (R26.81);Other abnormalities of gait and mobility (R26.89);Muscle weakness (generalized) (M62.81);Difficulty in walking, not elsewhere classified (R26.2)     Time: [id number] PT Time Calculation (min) (ACUTE ONLY): 17 min  Charges:  $Gait Training: 8-22 mins                     Julaine Fusi PTA 02/10/20, 4:20 PM

## 2020-02-10 NOTE — Progress Notes (Signed)
  Subjective: 1 Day Post-Op Procedure(s) (LRB): TOTAL KNEE ARTHROPLASTY - RNFA (Left) Patient reports pain as mild.   Patient is well, and has had no acute complaints or problems Plan is to go Home after hospital stay. Negative for chest pain and shortness of breath Fever: no Gastrointestinal:Negative for nausea and vomiting this morning, did vomit x3 following surgery yesterday.  Objective: Vital signs in last 24 hours: Temp:  [97 F (36.1 C)-98.3 F (36.8 C)] 97.6 F (36.4 C) (03/20 0802) Pulse Rate:  [57-84] 58 (03/20 0802) Resp:  [11-24] 17 (03/20 0802) BP: (110-148)/(60-76) 117/61 (03/20 0802) SpO2:  [92 %-100 %] 100 % (03/20 0802) FiO2 (%):  [32 %] 32 % (03/19 1605) Weight:  [99.3 kg] 99.3 kg (03/19 1039)  Intake/Output from previous day:  Intake/Output Summary (Last 24 hours) at 02/10/2020 0853 Last data filed at 02/10/2020 [id number] Gross per 24 hour  Intake 1314.85 ml  Output 600 ml  Net 714.85 ml    Intake/Output this shift: Total I/O In: 236.5 [I.V.:236.5] Out: -   Labs: Recent Labs    02/07/20 1019 02/09/20 1919 02/10/20 0526  HGB 15.7 14.2 12.3*   Recent Labs    02/09/20 1919 02/10/20 0526  WBC 14.4* 11.2*  RBC 4.59 4.05*  HCT 42.1 36.8*  PLT 211 212   Recent Labs    02/07/20 1019 02/07/20 1019 02/09/20 1919 02/10/20 0526  NA 142  --   --  135  K 4.4  --   --  4.2  CL 103  --   --  101  CO2 33*  --   --  27  BUN 17  --   --  20  CREATININE 0.92   < > 0.75 0.73  GLUCOSE 97  --   --  176*  CALCIUM 9.5  --   --  8.3*   < > = values in this interval not displayed.   No results for input(s): LABPT, INR in the last 72 hours.   EXAM General - Patient is Alert, Appropriate and Oriented Extremity - ABD soft Sensation intact distally Intact pulses distally Dorsiflexion/Plantar flexion intact Incision: Prevena woundvac intact No cellulitis present Dressing/Incision - Prevena woundvac intact, moderate bloody drainage.  Bulky dressing was  placed over woundvac, will change tomorrow. Motor Function - intact, moving foot and toes well on exam.   Past Medical History:  Diagnosis Date  . Allergic rhinitis   . Cancer of prostate ([place]) 4/08   s/p surgery  . History of kidney stones    H/O  . Hyperlipidemia   . Hypertension   . Personal history of kidney stones     Assessment/Plan: 1 Day Post-Op Procedure(s) (LRB): TOTAL KNEE ARTHROPLASTY - RNFA (Left) Active Problems:   Knee joint replacement status, left  Estimated body mass index is 29.7 kg/m as calculated from the following:   Height as of this encounter: 6' (1.829 m).   Weight as of this encounter: 99.3 kg. Advance diet Up with therapy D/C IV fluids when tolerating po intake.  Labs reviewed this AM. Hg 12.3.  No fevers. Begin working on BM. Up with therapy today. CBC and BMP ordered for tomorrow. Plan for possible d/c home tomorrow. Remove foley today.  DVT Prophylaxis - Lovenox, Foot Pumps and TED hose Weight-Bearing as tolerated to left leg  J. Cameron Proud, PA-C [facility] Orthopaedic Surgery 02/10/2020, 8:53 AM

## 2020-02-10 NOTE — Evaluation (Signed)
Occupational Therapy Evaluation Patient Details Name: Christopher Huang MRN: [id number] DOB: [birth year] Today's Date: 02/10/2020    History of Present Illness Christopher Huang is a 69 y.o. male s/p L TKA (3/19, Dr. Rudene Christians) with PMH: HTN, prostate cancer, kidney stones. Patient said pain for years, over the last 2 years pains been increasing along the medial compartment knee is noticed varus deformity to the knee. He wears a knee sleeve, has had cortisone injections with only a few weeks worth relief. His pain is interfering with activities of daily living and quality of life. Unable to walk 200 feet without pain.   Clinical Impression   Pt is Day 1 s/p L TKA - pt upon arrival from OT sitting EOB - wife present- pt able to do UB and  LB dressing with good sitting balance and no LOB - denies any dizziness of earlier with PT - pt do report still some numbness in ankle - will hold off on getting up - PT to return after lunch - discuss with pt and wife safety and recommendations for home to decrease risk for fall and increase safety - pt very motivated and would do great with PT - no OT services indicated at this time     Follow Up Recommendations  No OT follow up    Equipment Recommendations  3 in 1 bedside commode    Recommendations for Other Services       Precautions / Restrictions Precautions Precautions: Fall Required Braces or Orthoses: (able to do 10 leg lifts) Restrictions Weight Bearing Restrictions: Yes LLE Weight Bearing: Weight bearing as tolerated      Mobility Bed Mobility Overal bed mobility: Independent             General bed mobility comments: pt sitting EOB upon arrival and can - during sesssion - sit<> supine I - leg lifts 10 independent  Transfers                 General transfer comment: defer because of some numbness in R ankle and foot - PT to get pt up after lunch    Balance Overall balance assessment: Needs assistance Sitting-balance support:  Feet supported Sitting balance-Leahy Scale: Good Sitting balance - Comments: Able to sit EOB steady while reaching outside BOS.       Standing balance comment: Not assessed this session; will further assess if appropriate PM session.                           ADL either performed or assessed with clinical judgement   ADL Overall ADL's : Independent                                       General ADL Comments: sitting EOB can do foot wear and UB dressing ind - has walk in shower with seat and railing     Vision Baseline Vision/History: Wears glasses       Perception     Praxis      Pertinent Vitals/Pain Pain Assessment: No/denies pain     Hand Dominance     Extremity/Trunk Assessment Upper Extremity Assessment Upper Extremity Assessment: Overall WFL for tasks assessed   Lower Extremity Assessment Lower Extremity Assessment: RLE deficits/detail;LLE deficits/detail RLE Deficits / Details: Grossly WNLs LLE Sensation: decreased light touch(LE sensation absent along medial L leg)  Communication Communication Communication: No difficulties   Cognition Arousal/Alertness: Awake/alert Behavior During Therapy: WFL for tasks assessed/performed Overall Cognitive Status: Within Functional Limits for tasks assessed                                     General Comments       Exercises Total Joint Exercises Ankle Circles/Pumps: AROM;Both;20 reps Quad Sets: Strengthening;10 reps;Left Heel Slides: AROM;Left;10 reps Hip ABduction/ADduction: Strengthening;Left;10 reps Goniometric ROM: +10 to 95 deg. (knee extension: lacking 10 deg full extension, knee flexion: 95 deg) Other Exercises Other Exercises: Review with pt and husband some home safety and making sure to decrease fall risk - picking up loose ruggs, night light, LB dressing, not to  carry objects in hands with walker use- non slip shoes ,  LB dressing review with pt    Shoulder Instructions      Home Living Family/patient expects to be discharged to:: Private residence Living Arrangements: Spouse/significant other Available Help at Discharge: Family;Available 24 hours/day Type of Home: House Home Access: Stairs to enter [employer] of Steps: 7 in back with raling Entrance Stairs-Rails: Can reach both Home Layout: One level     Bathroom Shower/Tub: Occupational psychologist: Handicapped height     Home Equipment: Environmental consultant - 2 wheels;Cane - single point   Additional Comments: lives with wife, available 24/7      Prior Functioning/Environment Level of Independence: Independent        Comments: loves to fish and hunt        OT Problem List:        OT Treatment/Interventions:      OT Goals(Current goals can be found in the care plan section) Acute Rehab OT Goals Patient Stated Goal: go home and want to fish/hunt again OT Goal Formulation: With patient/family  OT Frequency:     Barriers to D/C:            Co-evaluation              AM-PAC OT "6 Clicks" Daily Activity     Outcome Measure                 End of Session    Activity Tolerance: Patient tolerated treatment well Patient left: in bed;with family/visitor present                   Time: [id number] OT Time Calculation (min): 21 min Charges:  OT General Charges $OT Visit: 1 Visit OT Evaluation $OT Eval Low Complexity: 1 Low    Jazzalyn Loewenstein OTR/L,CLT 02/10/2020, 1:48 PM

## 2020-02-10 NOTE — Evaluation (Signed)
Physical Therapy Evaluation Patient Details Name: Christopher Huang MRN: [id number] DOB: 02/17/1951 Today's Date: 02/10/2020   History of Present Illness  Christopher Huang is a 69 y.o. male s/p L TKA (3/19, Dr. Rudene Christians) with PMH: HTN, prostate cancer, kidney stones. Patient said pain for years, over the last 2 years pains been increasing along the medial compartment knee is noticed varus deformity to the knee. He wears a knee sleeve, has had cortisone injections with only a few weeks worth relief. His pain is interfering with activities of daily living and quality of life. Unable to walk 200 feet without pain.  Clinical Impression  Pt is a pleasant 69 year old M who was admitted for above diagnoses. Pt performs bed mobility with mod I requiring extra time and utilizing bedrail; transfers and ambulation deferred at this time secondary to pt's absence of sensation at LLE and reports of nausea and lightheadedness upon sitting which does not fully resolve with seated rest break; RN enters room at end of session and made aware. Pt's knee range of motion: +10 to 95 deg. Will further assess functional mobility as appropriate PM session; recommending SNF at this time until able to more fully assess; pt was previously independent with all mobility tasks and lives with wife who is available 24/7. Pt demonstrates deficits with sensation, range of motion, strength and balance requiring continued skilled PT intervention.     Follow Up Recommendations SNF(will further assess functional mobility PM session if appropriate)    Equipment Recommendations  3in1 (PT)(Pt reportedly has 2w RW and SPC)    Recommendations for Other Services       Precautions / Restrictions Precautions Precautions: Fall Restrictions Weight Bearing Restrictions: Yes LLE Weight Bearing: Weight bearing as tolerated      Mobility  Bed Mobility Overal bed mobility: Modified Independent             General bed mobility comments:  requiring extra time, performs supine>sit with mod I; reporting nausea lightheadedness once sitting followed by dry heaving  Transfers                 General transfer comment: Deferred transfers today as pt reports nausea and lightheadedness following supine>sit transfer; following brief episode of dry heaving pt continues to report lightheadedness, BP assessed and WNLs though slightly elevated (147/26mmHg).  Ambulation/Gait   Gait Distance (Feet): 0 Feet         General Gait Details: Deferred gait to absence of sensation and nausea/lightheadedness  Stairs            Wheelchair Mobility    Modified Rankin (Stroke Patients Only)       Balance Overall balance assessment: Needs assistance Sitting-balance support: Feet supported Sitting balance-Leahy Scale: Good Sitting balance - Comments: Able to sit EOB steady while reaching outside BOS.       Standing balance comment: Not assessed this session; will further assess if appropriate PM session.                             Pertinent Vitals/Pain Pain Assessment: No/denies pain("doesn't hurt since I'm not moving")    Home Living Family/patient expects to be discharged to:: Private residence Living Arrangements: Spouse/significant other Available Help at Discharge: Family;Available 24 hours/day Type of Home: House Home Access: Stairs to enter Entrance Stairs-Rails: Can reach both Entrance Stairs-Number of Steps: 6 Home Layout: One level Home Equipment: Walker - 2 wheels;Cane - single point Additional  Comments: lives with wife, available 24/7    Prior Function Level of Independence: Independent         Comments: using knee brace, ind with community and household ambulation, limited in distance secondary to pain     Hand Dominance        Extremity/Trunk Assessment   Upper Extremity Assessment Upper Extremity Assessment: Overall WFL for tasks assessed    Lower Extremity  Assessment Lower Extremity Assessment: RLE deficits/detail;LLE deficits/detail RLE Deficits / Details: Grossly WNLs LLE Sensation: decreased light touch(LE sensation absent along medial L leg)       Communication   Communication: No difficulties  Cognition Arousal/Alertness: Awake/alert Behavior During Therapy: WFL for tasks assessed/performed Overall Cognitive Status: Within Functional Limits for tasks assessed                                        General Comments      Exercises Total Joint Exercises Ankle Circles/Pumps: AROM;Both;20 reps Quad Sets: Strengthening;10 reps;Left Heel Slides: AROM;Left;10 reps Hip ABduction/ADduction: Strengthening;Left;10 reps Goniometric ROM: +10 to 95 deg. (knee extension: lacking 10 deg full extension, knee flexion: 95 deg)   Assessment/Plan    PT Assessment Patient needs continued PT services  PT Problem List Decreased strength;Decreased mobility;Decreased range of motion;Decreased activity tolerance;Decreased balance;Impaired sensation;Pain       PT Treatment Interventions DME instruction;Therapeutic exercise;Gait training;Balance training;Stair training;Neuromuscular re-education;Functional mobility training;Therapeutic activities;Patient/family education    PT Goals (Current goals can be found in the Care Plan section)  Acute Rehab PT Goals Patient Stated Goal: to walk around and go home PT Goal Formulation: With patient Time For Goal Achievement: 02/24/20 Potential to Achieve Goals: Good    Frequency BID   Barriers to discharge        Co-evaluation               AM-PAC PT "6 Clicks" Mobility  Outcome Measure Help needed turning from your back to your side while in a flat bed without using bedrails?: None Help needed moving from lying on your back to sitting on the side of a flat bed without using bedrails?: A Little Help needed moving to and from a bed to a chair (including a wheelchair)?: A  Lot Help needed standing up from a chair using your arms (e.g., wheelchair or bedside chair)?: A Lot Help needed to walk in hospital room?: A Lot Help needed climbing 3-5 steps with a railing? : A Lot 6 Click Score: 15    End of Session Equipment Utilized During Treatment: Gait belt Activity Tolerance: Patient tolerated treatment well;Other (comment)(limited by nausea, lightheadedness) Patient left: in bed;with nursing/sitter in room(handoff to RN who enters near end of PT session) Nurse Communication: Mobility status PT Visit Diagnosis: Unsteadiness on feet (R26.81);Other abnormalities of gait and mobility (R26.89);Muscle weakness (generalized) (M62.81);Difficulty in walking, not elsewhere classified (R26.2)    Time: [id number] PT Time Calculation (min) (ACUTE ONLY): 30 min   Charges:   PT Evaluation $PT Eval Moderate Complexity: 1 Mod PT Treatments $Therapeutic Exercise: 8-22 mins        Petra Kuba, PT, DPT 02/10/20, 11:15 AM

## 2020-02-10 NOTE — Progress Notes (Signed)
Patient resting in bed, teds and foot pumps on. Polar care in place. Wife at bedside.

## 2020-02-10 NOTE — TOC Initial Note (Signed)
Transition of Care [facility]) - Progression Note    Patient Details  Name: Christopher Huang MRN: [id number] Date of Birth: [birth year]  Transition of Care [facility]) CM/SW Contact  Boris Sharper, LCSW Phone Number:[phone] 02/10/2020, 2:21 PM  Clinical Narrative:    CSW spoke with pt's wife Christopher Huang and notified her of  the PT and MD recommendations. She stated that she want him to have [facility], OT and SLP and does not have preferred agency. She stated that pt has a RW, 3n1 and a cane at home, no further DME needed at this time.   CSW contacted Corene Cornea with [facility] and he stated that they do not offer SLP. CSW contacted Helene Kelp at [facility], she stated that they cannot take HTA insurance at this time. CSW contacted Malachy Mood with [facility] and she stated that they do not have  avaliability at this time. CSW contacted [place] with [facility] and he stated that they do not have SLP availability for this area. CSW contacted Tillie Rung at [facility] and they have no availability at this time.  CSW called Judson Roch with [facility] and left a message. CSW contacted Cassie with [facility] and she stated that they are not able to take HTA insurance at this time.  TOC will continue to follow for discharge planning needs.    Expected Discharge Plan: [place] Barriers to Discharge: Continued Medical Work up  Expected Discharge Plan and Services Expected Discharge Plan: [place] Choice: [place] arrangements for the past 2 months: Single Family Home                                       Social Determinants of Health (SDOH) Interventions    Readmission Risk Interventions No flowsheet data found.

## 2020-02-11 LAB — BASIC METABOLIC PANEL
Anion gap: 10 (ref 5–15)
BUN: 16 mg/dL (ref 8–23)
CO2: 27 mmol/L (ref 22–32)
Calcium: 8.5 mg/dL — ABNORMAL LOW (ref 8.9–10.3)
Chloride: 97 mmol/L — ABNORMAL LOW (ref 98–111)
Creatinine, Ser: 0.82 mg/dL (ref 0.61–1.24)
GFR calc Af Amer: >60 mL/min (ref >60)
GFR calc non Af Amer: >60 mL/min (ref >60)
Glucose, Bld: 124 mg/dL — ABNORMAL HIGH (ref 70–99)
Potassium: 3.9 mmol/L (ref 3.5–5.1)
Sodium: 134 mmol/L — ABNORMAL LOW (ref 135–145)

## 2020-02-11 LAB — CBC
HCT: 33.8 % — ABNORMAL LOW (ref 39.0–52.0)
Hemoglobin: 11.8 g/dL — ABNORMAL LOW (ref 13.0–17.0)
MCH: 30.8 pg (ref 26.0–34.0)
MCHC: 34.9 g/dL (ref 30.0–36.0)
MCV: 88.3 fL (ref 80.0–100.0)
Platelets: 192 10*3/uL (ref 150–400)
RBC: 3.83 MIL/uL — ABNORMAL LOW (ref 4.22–5.81)
RDW: 13.3 % (ref 11.5–15.5)
WBC: 8.4 10*3/uL (ref 4.0–10.5)
nRBC: 0 % (ref 0.0–0.2)

## 2020-02-11 MED ORDER — OXYCODONE HCL 5 MG PO TABS: 2.5000 mg | ORAL_TABLET | ORAL | 0 refills | Status: DC | PRN

## 2020-02-11 MED ORDER — ONDANSETRON HCL 4 MG PO TABS: 4.0000 mg | ORAL_TABLET | Freq: Four times a day (QID) | ORAL | 0 refills | Status: DC | PRN

## 2020-02-11 MED ORDER — ENOXAPARIN SODIUM 40 MG/0.4ML ~~LOC~~ SOLN: 40.0000 mg | SUBCUTANEOUS | 0 refills | Status: DC

## 2020-02-11 MED ORDER — OXYCODONE HCL 5 MG PO TABS
5.0000 mg | ORAL_TABLET | ORAL | Status: DC | PRN
  Administered 2020-02-11 – 2020-02-12 (×2): 5 mg via ORAL
  Filled 2020-02-11 (×2): qty 1

## 2020-02-11 MED ORDER — ACETAMINOPHEN 500 MG PO TABS: 1000.0000 mg | ORAL_TABLET | Freq: Four times a day (QID) | ORAL | 1 refills | Status: DC

## 2020-02-11 MED ORDER — ACETAMINOPHEN 500 MG PO TABS
1000.0000 mg | ORAL_TABLET | Freq: Four times a day (QID) | ORAL | Status: DC
  Administered 2020-02-11 – 2020-02-12 (×4): 1000 mg via ORAL
  Filled 2020-02-11 (×5): qty 2

## 2020-02-11 NOTE — Discharge Summary (Signed)
Physician Discharge Summary  Patient ID: Christopher Huang MRN: [id number] DOB/AGE: November 23, 1951 69 y.o.  Admit date: 02/09/2020 Discharge date: 02/11/2020  Admission Diagnoses:  Knee joint replacement status, left [Z96.652]  Discharge Diagnoses: Patient Active Problem List   Diagnosis Date Noted  . Knee joint replacement status, left 02/09/2020  . Abnormal EKG 02/07/2020  . Insomnia 02/07/2020  . Advanced care planning/counseling discussion 02/02/2019  . Arthritis of hand 11/25/2018  . Personal history of colonic polyps   . Benign neoplasm of cecum   . Benign neoplasm of transverse colon   . Polyp of sigmoid colon   . Allergic rhinitis   . Cancer of prostate ([place])   . Hyperlipidemia   . Hypertension   . Personal history of kidney stones     Past Medical History:  Diagnosis Date  . Allergic rhinitis   . Cancer of prostate ([place]) 4/08   s/p surgery  . History of kidney stones    H/O  . Hyperlipidemia   . Hypertension   . Personal history of kidney stones      Transfusion: None.   Consultants (if any):   Discharged Condition: Improved  Hospital Course: Christopher Huang is an 69 y.o. male who was admitted 02/09/2020 with a diagnosis of left knee osteoarthritis and went to the operating room on 02/09/2020 and underwent the above named procedures.    Surgeries: Procedure(s): TOTAL KNEE ARTHROPLASTY - RNFA on 02/09/2020 Patient tolerated the surgery well. Taken to PACU where she was stabilized and then transferred to the orthopedic floor.  Started on Lovenox 30mg  q 12 hrs. Foot pumps applied bilaterally at 80 mm. Heels elevated on bed with rolled towels. No evidence of DVT. Negative [place]. Physical therapy started on day #1 for gait training and transfer. OT started day #1 for ADL and assisted devices.  Patient's IV was removed on POD2.  Foley removed on POD1.  Patient discharged home with Prevena woundvac in place.  Implants: Medacta GMK Sphere 6+ left femur, 6 left tibia  with short stem, 10 mm insert and size 4 patella, all components cemented  He was given perioperative antibiotics:  Anti-infectives (From admission, onward)   Start     Dose/Rate Route Frequency Ordered Stop   02/09/20 2000  ceFAZolin (ANCEF) IVPB 2g/100 mL premix     2 g 200 mL/hr over 30 Minutes Intravenous Every 6 hours 02/09/20 1844 02/10/20 0303   02/09/20 1031  ceFAZolin (ANCEF) 2-4 GM/100ML-% IVPB    Note to Pharmacy: Leonia Reader   : cabinet override      02/09/20 1031 02/09/20 1355   02/09/20 1030  ceFAZolin (ANCEF) IVPB 2g/100 mL premix     2 g 200 mL/hr over 30 Minutes Intravenous On call to O.R. 02/09/20 1028 02/09/20 1338    .  He was given sequential compression devices, early ambulation, and Lovenox for DVT prophylaxis.  He benefited maximally from the hospital stay and there were no complications.    Recent vital signs:  Vitals:   02/10/20 2315 02/11/20 0729  BP: 125/72 (!) 125/59  Pulse: 77 75  Resp: 16   Temp: 97.7 F (36.5 C) 98.8 F (37.1 C)  SpO2: 98% 98%    Recent laboratory studies:  Lab Results  Component Value Date   HGB 11.8 (L) 02/11/2020   HGB 12.3 (L) 02/10/2020   HGB 14.2 02/09/2020   Lab Results  Component Value Date   WBC 8.4 02/11/2020   PLT 192 02/11/2020   No results  found for: INR Lab Results  Component Value Date   NA 134 (L) 02/11/2020   K 3.9 02/11/2020   CL 97 (L) 02/11/2020   CO2 27 02/11/2020   BUN 16 02/11/2020   CREATININE 0.82 02/11/2020   GLUCOSE 124 (H) 02/11/2020    Discharge Medications:   Allergies as of 02/11/2020      Reactions   Percocet [oxycodone-acetaminophen] Nausea And Vomiting   Tramadol Nausea And Vomiting   Vicodin [hydrocodone-acetaminophen] Nausea And Vomiting      Medication List    STOP taking these medications   aspirin EC 81 MG tablet   ibuprofen 200 MG tablet Commonly known as: ADVIL   meloxicam 15 MG tablet Commonly known as: MOBIC     TAKE these medications    acetaminophen 500 MG tablet Commonly known as: TYLENOL Take 2 tablets (1,000 mg total) by mouth every 6 (six) hours. What changed:   how much to take  when to take this  reasons to take this   Coenzyme Q10 100 MG Tabs Take 100 mg by mouth daily.   enoxaparin 40 MG/0.4ML injection Commonly known as: LOVENOX Inject 0.4 mLs (40 mg total) into the skin daily.   fluticasone 50 MCG/ACT nasal spray Commonly known as: FLONASE PLACE 2 SPRAYS INTO EACH NOSTRIL DAILY   lisinopril 10 MG tablet Commonly known as: ZESTRIL Take 1 tablet (10 mg total) by mouth every Monday, Tuesday, Wednesday, Thursday, and Friday.   Melatonin 10 MG Tabs Take 10 mg by mouth at bedtime.   multivitamin with minerals Tabs tablet Take 1 tablet by mouth daily. Men's Multivitamin 50+   ondansetron 4 MG tablet Commonly known as: ZOFRAN Take 1 tablet (4 mg total) by mouth every 6 (six) hours as needed for nausea.   oxyCODONE 5 MG immediate release tablet Commonly known as: Roxicodone Take 0.5-1 tablets (2.5-5 mg total) by mouth every 4 (four) hours as needed for breakthrough pain.   simvastatin 20 MG tablet Commonly known as: ZOCOR Take 1 tablet (20 mg total) by mouth every Monday, Tuesday, Wednesday, Thursday, and Friday. AT BEDTIME   Vitamin D3 50 MCG (2000 UT) Tabs Take 2,000 Units by mouth 2 (two) times a week. Once a day            Durable Medical Equipment  (From admission, onward)         Start     Ordered   02/09/20 1844  DME Walker rolling  Once    Question Answer Comment  Walker: With [place] Wheels   Patient needs a walker to treat with the following condition Knee joint replacement status, left      02/09/20 1844   02/09/20 1844  DME 3 n 1  Once     02/09/20 1844   02/09/20 1844  DME Bedside commode  Once    Question:  Patient needs a bedside commode to treat with the following condition  Answer:  Knee joint replacement status, left   02/09/20 1844          Diagnostic  Studies: DG Knee 1-2 Views Left  Result Date: 02/09/2020 CLINICAL DATA:  Status post total knee arthroplasty EXAM: LEFT KNEE - 1-2 VIEW COMPARISON:  None. FINDINGS: The patient is status post total knee arthroplasty on the left. There are expected postsurgical changes including subcutaneous gas and overlying soft tissue edema. Skin staples are noted. There is a large joint effusion. The alignment appears near anatomic. There is no evidence for immediate hardware  fracture or failure. IMPRESSION: Status post left total knee arthroplasty with expected postsurgical changes. Electronically Signed   By: Constance Holster M.D.   On: 02/09/2020 17:06   Disposition: Plan for possible discharge home on 02/11/20 pending progress with PT and having a bowel movement.  Follow-up Information    Duanne Guess, PA-C Follow up in 14 day(s).   Specialties: Orthopedic Surgery, Emergency Medicine Why: Staple removal. Contact information: [place] [address] [phone]          Signed: Judson Roch PA-C 02/11/2020, 9:14 AM

## 2020-02-11 NOTE — Progress Notes (Signed)
Subjective: 2 Days Post-Op Procedure(s) (LRB): TOTAL KNEE ARTHROPLASTY - RNFA (Left) Patient reports pain as mild.   Patient is well, and has had no acute complaints or problems Plan is to go Home after hospital stay. Negative for chest pain and shortness of breath Fever: no Gastrointestinal: Still experiencing some nausea.  Objective: Vital signs in last 24 hours: Temp:  [97.6 F (36.4 C)-98.8 F (37.1 C)] 98.8 F (37.1 C) (03/21 0729) Pulse Rate:  [68-77] 75 (03/21 0729) Resp:  [16-17] 16 (03/20 2315) BP: (125-141)/(59-78) 125/59 (03/21 0729) SpO2:  [98 %-100 %] 98 % (03/21 0729)  Intake/Output from previous day:  Intake/Output Summary (Last 24 hours) at 02/11/2020 0911 Last data filed at 02/11/2020 0405 Gross per 24 hour  Intake --  Output 1300 ml  Net -1300 ml    Intake/Output this shift: No intake/output data recorded.  Labs: Recent Labs    02/09/20 1919 02/10/20 0526 02/11/20 0515  HGB 14.2 12.3* 11.8*   Recent Labs    02/10/20 0526 02/11/20 0515  WBC 11.2* 8.4  RBC 4.05* 3.83*  HCT 36.8* 33.8*  PLT 212 192   Recent Labs    02/10/20 0526 02/11/20 0515  NA 135 134*  K 4.2 3.9  CL 101 97*  CO2 27 27  BUN 20 16  CREATININE 0.73 0.82  GLUCOSE 176* 124*  CALCIUM 8.3* 8.5*   No results for input(s): LABPT, INR in the last 72 hours.   EXAM General - Patient is Alert, Appropriate and Oriented Extremity - ABD soft Sensation intact distally Intact pulses distally Dorsiflexion/Plantar flexion intact Incision: Prevena woundvac intact No cellulitis present Dressing/Incision - Prevena woundvac intact, bulky dressing was removed, seal is unbroken.  New ACE wrap applied to the left knee. Motor Function - intact, moving foot and toes well on exam.   Past Medical History:  Diagnosis Date  . Allergic rhinitis   . Cancer of prostate ([place]) 4/08   s/p surgery  . History of kidney stones    H/O  . Hyperlipidemia   . Hypertension   . Personal history  of kidney stones     Assessment/Plan: 2 Days Post-Op Procedure(s) (LRB): TOTAL KNEE ARTHROPLASTY - RNFA (Left) Active Problems:   Knee joint replacement status, left  Estimated body mass index is 29.7 kg/m as calculated from the following:   Height as of this encounter: 6' (1.829 m).   Weight as of this encounter: 99.3 kg. Advance diet Up with therapy   Labs reviewed this AM. Hg 11.8.  No fevers. Continue to work on having BM. Up with therapy today. Plan for possible discharge home this afternoon.  DVT Prophylaxis - Lovenox, Foot Pumps and TED hose Weight-Bearing as tolerated to left leg  J. Cameron Proud, PA-C [facility] 02/11/2020, 9:11 AM

## 2020-02-11 NOTE — Progress Notes (Addendum)
Physical Therapy Treatment Patient Details Name: KRUE VANSKIKE MRN: [id number] DOB: January 09, 1951 Today's Date: 02/11/2020    History of Present Illness Jaron Monsees is a 69 y.o. male s/p L TKA (3/19, Dr. Rudene Christians) with PMH: HTN, prostate cancer, kidney stones. Patient said pain for years, over the last 2 years pains been increasing along the medial compartment knee is noticed varus deformity to the knee. He wears a knee sleeve, has had cortisone injections with only a few weeks worth relief. His pain is interfering with activities of daily living and quality of life. Unable to walk 200 feet without pain.    PT Comments    Pt ready for session.  Hoping to discharge home today.  Pre-medicated before session about 45 minutes.  Participated in exercises as described below.  To EOB with supervision and increased time.  Stood quickly to RW with poor hand placements and was able to walk to PT gym with overall poor gait pattern and safety with mod verbal cues for step pattern and safety.  Significantly decreased stance time left and irregular pattern.  Pt instructed to sit on mat and education and demo of proper gait given.  Trial of stairs.  He is able to go up/down 2 steps with bilateral rails but unable to transition safety to 1 rail as he has at home.  After seated rest and further discussion regarding discharge today, he and wife agreed that he was not quite ready for home today due to increased pain, overall increased difficulty with gait and poor safety today.  He is able to complete lap around unit with improved gait pattern but continues to need moderate verbal cues.  Upon return to room, he increases speed and is generally unsafe.  Continued education given.  Discussed with Dr, Roland Rack and RN after session and recommended delaying discharge until tomorrow.  IV noted to be attached but not in his hand.  He stated it came out yesterday.  Tape removed and RN notified.   Follow Up Recommendations  Home  health PT     Equipment Recommendations  3in1 (PT)    Recommendations for Other Services       Precautions / Restrictions Precautions Precautions: Fall Restrictions Weight Bearing Restrictions: Yes LLE Weight Bearing: Weight bearing as tolerated    Mobility  Bed Mobility Overal bed mobility: Modified Independent                Transfers Overall transfer level: Needs assistance Equipment used: Rolling walker (2 wheeled) Transfers: Sit to/from Stand Sit to Stand: Min guard         General transfer comment: cues for hand placements and to slow down today  Ambulation/Gait Ambulation/Gait assistance: Min guard;Min assist Gait Distance (Feet): 200 Feet Assistive device: Rolling walker (2 wheeled) Gait Pattern/deviations: Step-to pattern;Decreased step length - right;Decreased step length - left;Trunk flexed;Ataxic;Decreased stance time - left Gait velocity: decreased   General Gait Details: generally unsteady gait with poor safety and decreased stance time left.  Pt reports increased pain today affecting gait and safety.   Stairs Stairs: Yes Stairs assistance: Min assist;Mod assist Stair Management: Two rails;One rail Left;Step to pattern Number of Stairs: 2 General stair comments: general difficulty today, poor safety   Wheelchair Mobility    Modified Rankin (Stroke Patients Only)       Balance Overall balance assessment: Needs assistance Sitting-balance support: Feet supported Sitting balance-Leahy Scale: Good     Standing balance support: Bilateral upper extremity supported Standing balance-Leahy Scale:  Poor                              Cognition Arousal/Alertness: Awake/alert Behavior During Therapy: WFL for tasks assessed/performed Overall Cognitive Status: Within Functional Limits for tasks assessed                                        Exercises Total Joint Exercises Ankle Circles/Pumps: AROM;Both;20  reps Quad Sets: Strengthening;10 reps;Left Heel Slides: AROM;Left;10 reps;AAROM Hip ABduction/ADduction: Strengthening;Left;10 reps;AAROM;AROM Straight Leg Raises: AROM;AAROM;Strengthening;Left;10 reps Long Arc Quad: AROM;AAROM;Strengthening;Left;10 reps Knee Flexion: AAROM;Left;5 reps Goniometric ROM: 3-95 Other Exercises Other Exercises: safety and progression of therapy    General Comments        Pertinent Vitals/Pain Pain Assessment: No/denies pain    Home Living                      Prior Function            PT Goals (current goals can now be found in the care plan section) Progress towards PT goals: Progressing toward goals    Frequency    BID      PT Plan Current plan remains appropriate    Co-evaluation              AM-PAC PT "6 Clicks" Mobility   Outcome Measure  Help needed turning from your back to your side while in a flat bed without using bedrails?: None Help needed moving from lying on your back to sitting on the side of a flat bed without using bedrails?: None Help needed moving to and from a bed to a chair (including a wheelchair)?: A Little Help needed standing up from a chair using your arms (e.g., wheelchair or bedside chair)?: A Little Help needed to walk in hospital room?: A Little Help needed climbing 3-5 steps with a railing? : A Little 6 Click Score: 20    End of Session Equipment Utilized During Treatment: Gait belt Activity Tolerance: Other (comment);Patient limited by pain Patient left: in chair;with call bell/phone within reach;with chair alarm set;with family/visitor present Nurse Communication: Mobility status       Time: 1010-1048 PT Time Calculation (min) (ACUTE ONLY): 38 min  Charges:  $Gait Training: 23-37 mins $Therapeutic Exercise: 8-22 mins                    Chesley Noon, PTA 02/11/20, 11:08 AM

## 2020-02-12 NOTE — TOC Transition Note (Addendum)
Transition of Care [facility]) - CM/SW Discharge Note   Patient Details  Name: Christopher Huang MRN: [id number] Date of Birth: [birth year]  [facility] CM/SW Contact:  Shade Flood, LCSW Phone Number: 02/12/2020, 10:08 AM   Clinical Narrative:     Pt stable for dc today per MD. Plan remains for dc home with [facility]. Spoke with Helene Kelp at [facility] this AM to notify of pt's dc. She states that they will follow pt at home.  There are no other TOC needs identified for dc.  1353: Notified by pt's RN that pt now saying that the walker he has at home is old and that he was told he would be provided a new one at dc. Spoke with Leroy Sea at [employer] and walker from their closet delivered to pt by RN. Order already in chart from post op orders.   Final next level of care: [place] Barriers to Discharge: Barriers Resolved   Patient Goals and CMS Choice Patient states their goals for this hospitalization and ongoing recovery are:: to gain strength CMS Medicare.gov Compare Post Acute Care list provided to:: Patient Choice offered to / list presented to : Patient, Spouse  Discharge Placement                       Discharge Plan and Services     Post Acute Care Choice: Home Health                    HH Arranged: PT [place]: [facility] [employer]) Date [place]: 02/09/20   Representative spoke with at [place]: [place]) Interventions     Readmission Risk Interventions No flowsheet data found.

## 2020-02-12 NOTE — Progress Notes (Signed)
   Subjective: 3 Days Post-Op Procedure(s) (LRB): TOTAL KNEE ARTHROPLASTY - RNFA (Left) Patient reports pain as mild.   Patient is well, and has had no acute complaints or problems Denies any CP, SOB, ABD pain. We will continue therapy today.  Plan is to go Home after hospital stay.  Objective: Vital signs in last 24 hours: Temp:  [98.2 F (36.8 C)-99.1 F (37.3 C)] 98.2 F (36.8 C) (03/22 0746) Pulse Rate:  [74-91] 80 (03/22 0746) Resp:  [17] 17 (03/22 0746) BP: (117-127)/(63-67) 117/67 (03/22 0746) SpO2:  [94 %-97 %] 97 % (03/22 0746)  Intake/Output from previous day: 03/21 0701 - 03/22 0700 In: 0  Out: 200 [Urine:200] Intake/Output this shift: No intake/output data recorded.  Recent Labs    02/09/20 1919 02/10/20 0526 02/11/20 0515  HGB 14.2 12.3* 11.8*   Recent Labs    02/10/20 0526 02/11/20 0515  WBC 11.2* 8.4  RBC 4.05* 3.83*  HCT 36.8* 33.8*  PLT 212 192   Recent Labs    02/10/20 0526 02/11/20 0515  NA 135 134*  K 4.2 3.9  CL 101 97*  CO2 27 27  BUN 20 16  CREATININE 0.73 0.82  GLUCOSE 176* 124*  CALCIUM 8.3* 8.5*   No results for input(s): LABPT, INR in the last 72 hours.  EXAM General - Patient is Alert, Appropriate and Oriented Extremity - Neurovascular intact Sensation intact distally Intact pulses distally Dorsiflexion/Plantar flexion intact No cellulitis present Compartment soft Dressing - dressing C/D/I and no drainage, Praveena intact with 250 cc of drainage Motor Function - intact, moving foot and toes well on exam.   Past Medical History:  Diagnosis Date  . Allergic rhinitis   . Cancer of prostate ([place]) 4/08   s/p surgery  . History of kidney stones    H/O  . Hyperlipidemia   . Hypertension   . Personal history of kidney stones     Assessment/Plan:   3 Days Post-Op Procedure(s) (LRB): TOTAL KNEE ARTHROPLASTY - RNFA (Left) Active Problems:   Knee joint replacement status, left  Estimated body mass index is 29.7  kg/m as calculated from the following:   Height as of this encounter: 6' (1.829 m).   Weight as of this encounter: 99.3 kg. Advance diet Up with therapy  Needs bowel movement Plan on discharge to home with home health PT today  DVT Prophylaxis - Lovenox, Foot Pumps and TED hose Weight-Bearing as tolerated to left leg   T. Rachelle Hora, PA-C [place] 02/12/2020, 7:51 AM

## 2020-02-12 NOTE — Care Management Important Message (Signed)
Important Message  Patient Details  Name: BLEU REICHEL MRN: [id number] Date of Birth: August 28, 1951   Medicare Important Message Given:  Yes     Shelbie Ammons, RN 02/12/2020, 10:53 AM

## 2020-02-12 NOTE — Progress Notes (Signed)
Physical Therapy Treatment Patient Details Name: Christopher Huang MRN: [id number] DOB: April 27, 1951 Today's Date: 02/12/2020    History of Present Illness Christopher Huang is a 69 y.o. male s/p L TKA (3/19, Dr. Rudene Christians) with PMH: HTN, prostate cancer, kidney stones. Patient said pain for years, over the last 2 years pains been increasing along the medial compartment knee is noticed varus deformity to the knee. He wears a knee sleeve, has had cortisone injections with only a few weeks worth relief. His pain is interfering with activities of daily living and quality of life. Unable to walk 200 feet without pain.    PT Comments    Pt c/o pain today.  Pre-medicated before session.  Participated in exercises as described below.  To EOB with min a x 1 for LE assist.  Stood to RW with min guard/assist x 1 with improved technique today.  He is able to walk unit x 1 with slow gait with occasional verbal cues for safety.  To PT gym for stair training x 2 up/down with 1 rail left as at home.  While he continues to need occasional cues he demonstrates ability to enter/exit his home and walk as needed with +1 assist.  Wife in for session and educated on safety and cues.  She voices understanding.  He does seem to have some cognitive deficits which wife stated is normal for him.  She will have assist to get him into the home and encouraged +1 assist with mobility.    Follow Up Recommendations  Home health PT     Equipment Recommendations  3in1 (PT);Rolling walker with 5" wheels    Recommendations for Other Services       Precautions / Restrictions Precautions Precautions: Fall Restrictions Weight Bearing Restrictions: Yes LLE Weight Bearing: Weight bearing as tolerated    Mobility  Bed Mobility Overal bed mobility: Needs Assistance Bed Mobility: Supine to Sit     Supine to sit: Min assist        Transfers Overall transfer level: Needs assistance Equipment used: Rolling walker (2  wheeled) Transfers: Sit to/from Stand Sit to Stand: Min guard         General transfer comment: cues for hand placements and to slow down today  Ambulation/Gait Ambulation/Gait assistance: Min guard;Min assist Gait Distance (Feet): 200 Feet Assistive device: Rolling walker (2 wheeled) Gait Pattern/deviations: Step-to pattern;Decreased step length - right;Decreased step length - left;Trunk flexed;Ataxic;Decreased stance time - left Gait velocity: decreased   General Gait Details: overall improved gait today but continues to need verbal cues for safety at times.  wife present and educated.   Stairs Stairs: Yes Stairs assistance: Min assist Stair Management: Two rails;One rail Left;Step to pattern Number of Stairs: 8 General stair comments: improved today.  continues with min assist for assist and cues for sequencing.   Wheelchair Mobility    Modified Rankin (Stroke Patients Only)       Balance Overall balance assessment: Needs assistance Sitting-balance support: Feet supported Sitting balance-Leahy Scale: Good     Standing balance support: Bilateral upper extremity supported Standing balance-Leahy Scale: Fair                              Cognition Arousal/Alertness: Awake/alert Behavior During Therapy: WFL for tasks assessed/performed Overall Cognitive Status: Within Functional Limits for tasks assessed  Exercises Total Joint Exercises Ankle Circles/Pumps: AROM;Both;20 reps Quad Sets: Strengthening;10 reps;Left Heel Slides: AROM;Left;10 reps;AAROM Hip ABduction/ADduction: Strengthening;Left;10 reps;AAROM;AROM Straight Leg Raises: AROM;AAROM;Strengthening;Left;10 reps Long Arc Quad: AROM;AAROM;Strengthening;Left;10 reps Knee Flexion: AAROM;Left;5 reps Goniometric ROM: 3-85 Other Exercises Other Exercises: safety and progression of therapy, stairs    General Comments        Pertinent  Vitals/Pain Pain Assessment: Faces Faces Pain Scale: Hurts whole lot Pain Location: L knee Pain Descriptors / Indicators: Aching;Sore Pain Intervention(s): Premedicated before session;Monitored during session;Limited activity within patient's tolerance;Ice applied    Home Living                      Prior Function            PT Goals (current goals can now be found in the care plan section) Progress towards PT goals: Progressing toward goals    Frequency    BID      PT Plan Current plan remains appropriate    Co-evaluation              AM-PAC PT "6 Clicks" Mobility   Outcome Measure  Help needed turning from your back to your side while in a flat bed without using bedrails?: None Help needed moving from lying on your back to sitting on the side of a flat bed without using bedrails?: None Help needed moving to and from a bed to a chair (including a wheelchair)?: A Little Help needed standing up from a chair using your arms (e.g., wheelchair or bedside chair)?: A Little Help needed to walk in hospital room?: A Little Help needed climbing 3-5 steps with a railing? : A Little 6 Click Score: 20    End of Session Equipment Utilized During Treatment: Gait belt Activity Tolerance: Patient tolerated treatment well Patient left: in chair;with call bell/phone within reach;with chair alarm set;with family/visitor present Nurse Communication: Mobility status       Time: [id number] PT Time Calculation (min) (ACUTE ONLY): 33 min  Charges:  $Gait Training: 8-22 mins $Therapeutic Exercise: 8-22 mins                    Chesley Noon, PTA 02/12/20, 1:18 PM

## 2020-02-12 NOTE — Anesthesia Postprocedure Evaluation (Signed)
Anesthesia Post Note  Patient: Christopher Huang  Procedure(s) Performed: TOTAL KNEE ARTHROPLASTY - RNFA (Left Knee)  Patient location during evaluation: PACU Anesthesia Type: Combined General/Spinal Level of consciousness: awake and alert Pain management: pain level controlled Vital Signs Assessment: post-procedure vital signs reviewed and stable Respiratory status: spontaneous breathing, nonlabored ventilation, respiratory function stable and patient connected to nasal cannula oxygen Cardiovascular status: blood pressure returned to baseline and stable Postop Assessment: no apparent nausea or vomiting and spinal receding Anesthetic complications: no     Last Vitals:  Vitals:   02/11/20 2315 02/12/20 0746  BP: 127/63 117/67  Pulse: 91 80  Resp: 17 17  Temp: 37.3 C 36.8 C  SpO2: 96% 97%    Last Pain:  Vitals:   02/12/20 0746  TempSrc: Oral  PainSc:                  Arita Miss

## 2020-02-12 NOTE — Discharge Instructions (Signed)
Diet: As you were doing prior to hospitalization   Shower:  May shower but keep the wounds dry, use an occlusive plastic wrap, NO SOAKING IN TUB.  If the bandage gets wet, change with a clean dry gauze.  Dressing:  Please remove provena negative pressure dressing on 02/19/2020 and apply honey comb dressing. Keep dressing clean and dry at all times.   Activity:  Increase activity slowly as tolerated, but follow the weight bearing instructions below.  No lifting or driving for 6 weeks.  Weight Bearing:   Weight bearing as tolerated to left lower extremity  To prevent constipation: you may use a stool softener such as -  Colace (over the counter) 100 mg by mouth twice a day  Drink plenty of fluids (prune juice may be helpful) and high fiber foods Miralax (over the counter) for constipation as needed.    Itching:  If you experience itching with your medications, try taking only a single pain pill, or even half a pain pill at a time.  You may take up to 10 pain pills per day, and you can also use benadryl over the counter for itching or also to help with sleep.   Precautions:  If you experience chest pain or shortness of breath - call 911 immediately for transfer to the hospital emergency department!!  If you develop a fever greater that 101 F, purulent drainage from wound, increased redness or drainage from wound, or calf pain-Call [place]                                              Follow- Up Appointment:  Please call for an appointment to be seen in 2 weeks at [facility]

## 2020-02-13 DIAGNOSIS — M19049 Primary osteoarthritis, unspecified hand: Secondary | ICD-10-CM | POA: Diagnosis not present

## 2020-02-13 DIAGNOSIS — H539 Unspecified visual disturbance: Secondary | ICD-10-CM | POA: Diagnosis not present

## 2020-02-13 DIAGNOSIS — J309 Allergic rhinitis, unspecified: Secondary | ICD-10-CM | POA: Diagnosis not present

## 2020-02-13 DIAGNOSIS — D123 Benign neoplasm of transverse colon: Secondary | ICD-10-CM | POA: Diagnosis not present

## 2020-02-13 DIAGNOSIS — N200 Calculus of kidney: Secondary | ICD-10-CM | POA: Diagnosis not present

## 2020-02-13 DIAGNOSIS — G4700 Insomnia, unspecified: Secondary | ICD-10-CM | POA: Diagnosis not present

## 2020-02-13 DIAGNOSIS — Z471 Aftercare following joint replacement surgery: Secondary | ICD-10-CM | POA: Diagnosis not present

## 2020-02-13 DIAGNOSIS — D120 Benign neoplasm of cecum: Secondary | ICD-10-CM | POA: Diagnosis not present

## 2020-02-13 DIAGNOSIS — Z8546 Personal history of malignant neoplasm of prostate: Secondary | ICD-10-CM | POA: Diagnosis not present

## 2020-02-13 DIAGNOSIS — Z7901 Long term (current) use of anticoagulants: Secondary | ICD-10-CM | POA: Diagnosis not present

## 2020-02-13 DIAGNOSIS — Z96652 Presence of left artificial knee joint: Secondary | ICD-10-CM | POA: Diagnosis not present

## 2020-02-13 DIAGNOSIS — I10 Essential (primary) hypertension: Secondary | ICD-10-CM | POA: Diagnosis not present

## 2020-02-13 DIAGNOSIS — E785 Hyperlipidemia, unspecified: Secondary | ICD-10-CM | POA: Diagnosis not present

## 2020-02-23 DIAGNOSIS — D120 Benign neoplasm of cecum: Secondary | ICD-10-CM | POA: Diagnosis not present

## 2020-02-23 DIAGNOSIS — J309 Allergic rhinitis, unspecified: Secondary | ICD-10-CM | POA: Diagnosis not present

## 2020-02-23 DIAGNOSIS — Z8546 Personal history of malignant neoplasm of prostate: Secondary | ICD-10-CM | POA: Diagnosis not present

## 2020-02-23 DIAGNOSIS — I10 Essential (primary) hypertension: Secondary | ICD-10-CM | POA: Diagnosis not present

## 2020-02-23 DIAGNOSIS — Z96652 Presence of left artificial knee joint: Secondary | ICD-10-CM | POA: Diagnosis not present

## 2020-02-23 DIAGNOSIS — N200 Calculus of kidney: Secondary | ICD-10-CM | POA: Diagnosis not present

## 2020-02-23 DIAGNOSIS — E785 Hyperlipidemia, unspecified: Secondary | ICD-10-CM | POA: Diagnosis not present

## 2020-02-23 DIAGNOSIS — H539 Unspecified visual disturbance: Secondary | ICD-10-CM | POA: Diagnosis not present

## 2020-02-23 DIAGNOSIS — M19049 Primary osteoarthritis, unspecified hand: Secondary | ICD-10-CM | POA: Diagnosis not present

## 2020-02-23 DIAGNOSIS — G4700 Insomnia, unspecified: Secondary | ICD-10-CM | POA: Diagnosis not present

## 2020-02-23 DIAGNOSIS — Z471 Aftercare following joint replacement surgery: Secondary | ICD-10-CM | POA: Diagnosis not present

## 2020-02-23 DIAGNOSIS — D123 Benign neoplasm of transverse colon: Secondary | ICD-10-CM | POA: Diagnosis not present

## 2020-02-23 DIAGNOSIS — Z7901 Long term (current) use of anticoagulants: Secondary | ICD-10-CM | POA: Diagnosis not present

## 2020-02-26 DIAGNOSIS — M25662 Stiffness of left knee, not elsewhere classified: Secondary | ICD-10-CM | POA: Diagnosis not present

## 2020-02-28 DIAGNOSIS — M25662 Stiffness of left knee, not elsewhere classified: Secondary | ICD-10-CM | POA: Diagnosis not present

## 2020-03-01 DIAGNOSIS — M25662 Stiffness of left knee, not elsewhere classified: Secondary | ICD-10-CM | POA: Diagnosis not present

## 2020-03-04 DIAGNOSIS — M25662 Stiffness of left knee, not elsewhere classified: Secondary | ICD-10-CM | POA: Diagnosis not present

## 2020-03-06 DIAGNOSIS — M25662 Stiffness of left knee, not elsewhere classified: Secondary | ICD-10-CM | POA: Diagnosis not present

## 2020-03-08 ENCOUNTER — Encounter: Payer: Self-pay | Admitting: *Deleted

## 2020-03-08 DIAGNOSIS — M25662 Stiffness of left knee, not elsewhere classified: Secondary | ICD-10-CM | POA: Diagnosis not present

## 2020-03-11 DIAGNOSIS — M25662 Stiffness of left knee, not elsewhere classified: Secondary | ICD-10-CM | POA: Diagnosis not present

## 2020-03-13 DIAGNOSIS — M25662 Stiffness of left knee, not elsewhere classified: Secondary | ICD-10-CM | POA: Diagnosis not present

## 2020-03-15 DIAGNOSIS — M25662 Stiffness of left knee, not elsewhere classified: Secondary | ICD-10-CM | POA: Diagnosis not present

## 2020-03-18 DIAGNOSIS — M25662 Stiffness of left knee, not elsewhere classified: Secondary | ICD-10-CM | POA: Diagnosis not present

## 2020-03-20 DIAGNOSIS — M25662 Stiffness of left knee, not elsewhere classified: Secondary | ICD-10-CM | POA: Diagnosis not present

## 2020-03-22 DIAGNOSIS — M25552 Pain in left hip: Secondary | ICD-10-CM | POA: Diagnosis not present

## 2020-03-22 DIAGNOSIS — M1712 Unilateral primary osteoarthritis, left knee: Secondary | ICD-10-CM | POA: Diagnosis not present

## 2020-03-25 DIAGNOSIS — M25662 Stiffness of left knee, not elsewhere classified: Secondary | ICD-10-CM | POA: Diagnosis not present

## 2020-03-27 DIAGNOSIS — M25662 Stiffness of left knee, not elsewhere classified: Secondary | ICD-10-CM | POA: Diagnosis not present

## 2020-04-01 DIAGNOSIS — M25662 Stiffness of left knee, not elsewhere classified: Secondary | ICD-10-CM | POA: Diagnosis not present

## 2020-04-03 DIAGNOSIS — M25662 Stiffness of left knee, not elsewhere classified: Secondary | ICD-10-CM | POA: Diagnosis not present

## 2020-04-08 DIAGNOSIS — M25662 Stiffness of left knee, not elsewhere classified: Secondary | ICD-10-CM | POA: Diagnosis not present

## 2020-04-10 DIAGNOSIS — M25662 Stiffness of left knee, not elsewhere classified: Secondary | ICD-10-CM | POA: Diagnosis not present

## 2020-05-08 DIAGNOSIS — H35373 Puckering of macula, bilateral: Secondary | ICD-10-CM | POA: Diagnosis not present

## 2020-05-08 DIAGNOSIS — H3554 Dystrophies primarily involving the retinal pigment epithelium: Secondary | ICD-10-CM | POA: Diagnosis not present

## 2020-05-08 DIAGNOSIS — H2513 Age-related nuclear cataract, bilateral: Secondary | ICD-10-CM | POA: Diagnosis not present

## 2020-05-08 DIAGNOSIS — H25013 Cortical age-related cataract, bilateral: Secondary | ICD-10-CM | POA: Diagnosis not present

## 2020-05-21 DIAGNOSIS — H2512 Age-related nuclear cataract, left eye: Secondary | ICD-10-CM | POA: Diagnosis not present

## 2020-05-21 DIAGNOSIS — H25812 Combined forms of age-related cataract, left eye: Secondary | ICD-10-CM | POA: Diagnosis not present

## 2020-05-28 DIAGNOSIS — H2512 Age-related nuclear cataract, left eye: Secondary | ICD-10-CM | POA: Diagnosis not present

## 2020-06-03 ENCOUNTER — Other Ambulatory Visit: Payer: Self-pay | Admitting: Orthopedic Surgery

## 2020-06-03 DIAGNOSIS — S32020A Wedge compression fracture of second lumbar vertebra, initial encounter for closed fracture: Secondary | ICD-10-CM | POA: Diagnosis not present

## 2020-06-03 DIAGNOSIS — M25552 Pain in left hip: Secondary | ICD-10-CM | POA: Diagnosis not present

## 2020-06-03 DIAGNOSIS — M4316 Spondylolisthesis, lumbar region: Secondary | ICD-10-CM | POA: Diagnosis not present

## 2020-06-03 DIAGNOSIS — M4727 Other spondylosis with radiculopathy, lumbosacral region: Secondary | ICD-10-CM | POA: Diagnosis not present

## 2020-06-03 DIAGNOSIS — M4807 Spinal stenosis, lumbosacral region: Secondary | ICD-10-CM | POA: Diagnosis not present

## 2020-06-03 DIAGNOSIS — M5416 Radiculopathy, lumbar region: Secondary | ICD-10-CM | POA: Diagnosis not present

## 2020-06-03 DIAGNOSIS — H25011 Cortical age-related cataract, right eye: Secondary | ICD-10-CM | POA: Diagnosis not present

## 2020-06-03 DIAGNOSIS — M5136 Other intervertebral disc degeneration, lumbar region: Secondary | ICD-10-CM

## 2020-06-03 DIAGNOSIS — H2511 Age-related nuclear cataract, right eye: Secondary | ICD-10-CM | POA: Diagnosis not present

## 2020-06-11 DIAGNOSIS — H2511 Age-related nuclear cataract, right eye: Secondary | ICD-10-CM | POA: Diagnosis not present

## 2020-06-11 DIAGNOSIS — H25011 Cortical age-related cataract, right eye: Secondary | ICD-10-CM | POA: Diagnosis not present

## 2020-06-18 DIAGNOSIS — H2511 Age-related nuclear cataract, right eye: Secondary | ICD-10-CM | POA: Diagnosis not present

## 2020-06-20 ENCOUNTER — Ambulatory Visit
Admission: RE | Admit: 2020-06-20 | Discharge: 2020-06-20 | Disposition: A | Discharge status: Home / Self Care | Payer: PPO | Source: Ambulatory Visit | Attending: Orthopedic Surgery | Admitting: Orthopedic Surgery

## 2020-06-20 ENCOUNTER — Other Ambulatory Visit: Payer: Self-pay

## 2020-06-20 DIAGNOSIS — M5136 Other intervertebral disc degeneration, lumbar region: Secondary | ICD-10-CM | POA: Diagnosis not present

## 2020-06-20 DIAGNOSIS — M4807 Spinal stenosis, lumbosacral region: Secondary | ICD-10-CM | POA: Diagnosis not present

## 2020-06-20 DIAGNOSIS — M545 Low back pain: Secondary | ICD-10-CM | POA: Diagnosis not present

## 2020-06-20 IMAGING — MR MR LUMBAR SPINE W/O CM
5 series · 30 of 48 positions shown · non-contrast
Comparison: Lumbar spine radiographs [DATE]

CLINICAL DATA: Chronic low back pain radiating into the buttocks
and posterior thighs.

EXAM:
MRI LUMBAR SPINE WITHOUT CONTRAST
TECHNIQUE: Multiplanar, multisequence MR imaging of the lumbar spine was
performed. No intravenous contrast was administered.

[Series 5: T2 · sagittal · 4.0mm · 0.81mm/px · 6 of 18 slices shown (1 of 2)]
[im 1/18]
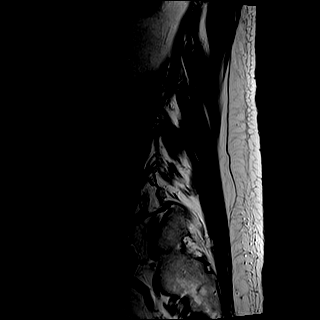
[im 4/18]
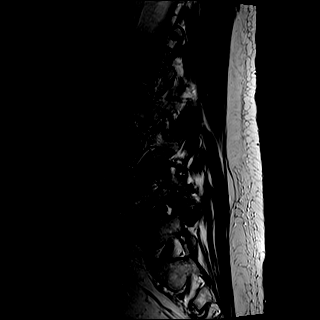
[im 7/18]
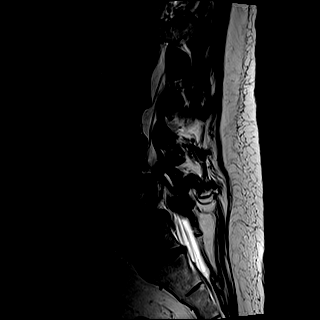
[im 11/18]
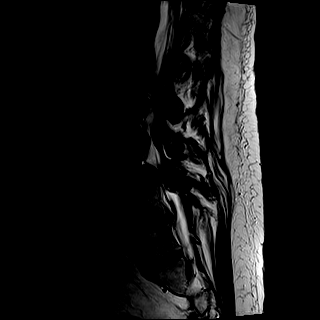
[im 14/18]
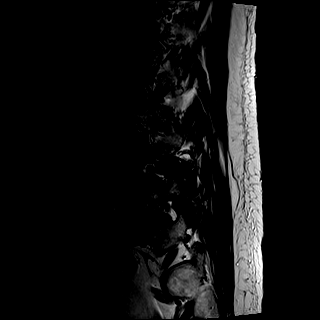
[im 18/18]
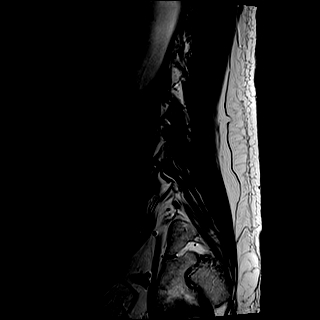

[Series 6: T1 · sagittal · 4.0mm · 0.81mm/px · 7 of 18 slices shown (1 of 2)]
[im 1/18]
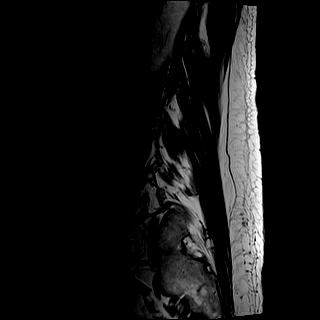
[im 3/18]
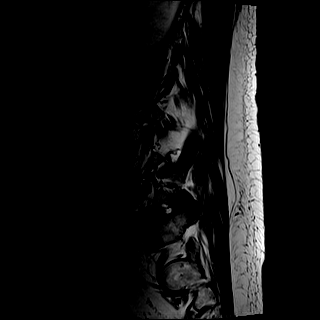
[im 6/18]
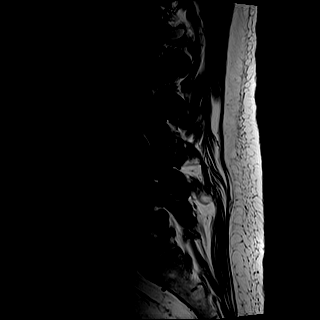
[im 9/18]
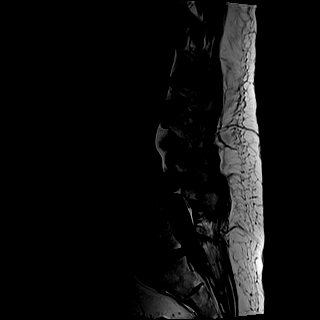
[im 12/18]
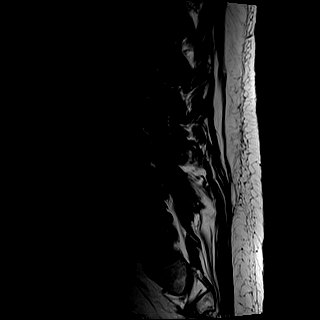
[im 15/18]
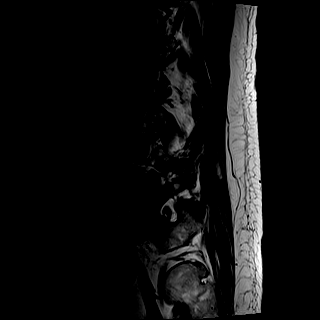
[im 18/18]
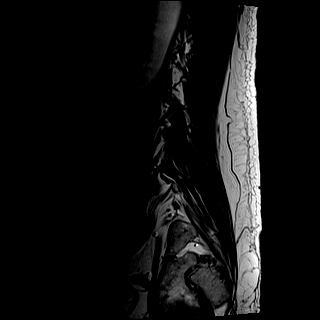

[Series 7: STIR · sagittal · 4.0mm · 0.41mm/px · 1 of 18 slices shown]
[im 1/18]
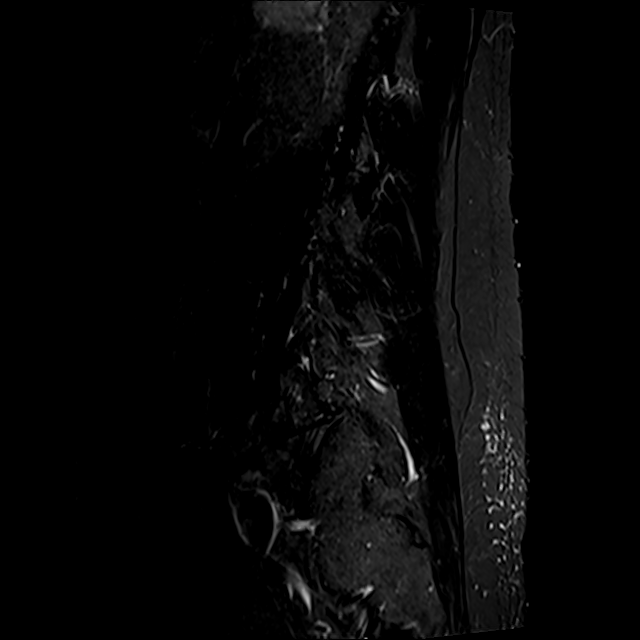

[Series 8: T2 · axial · 4.0mm · 0.78mm/px · z∈[-144,+87]mm · 8 of 36 slices shown (2 of 2)]
[im 1/36]
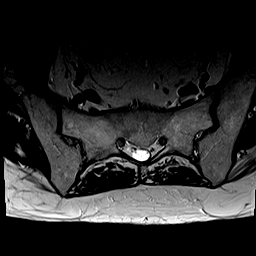
[im 6/36]
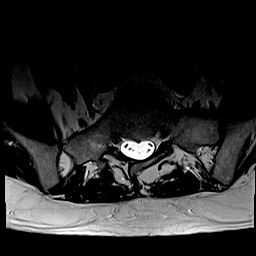
[im 11/36]
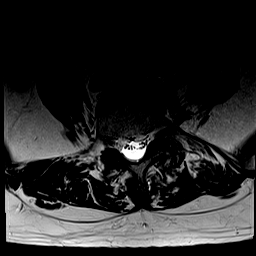
[im 17/36]
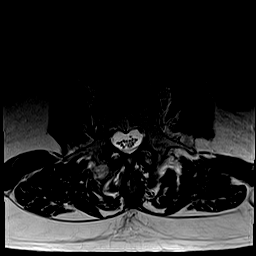
[im 19/36]
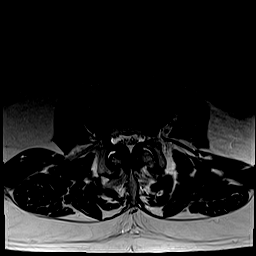
[im 25/36]
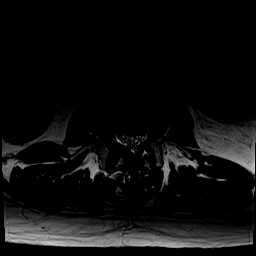
[im 30/36]
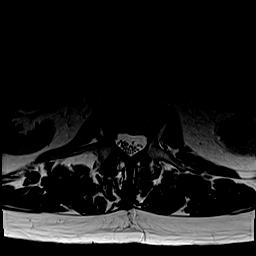
[im 36/36]
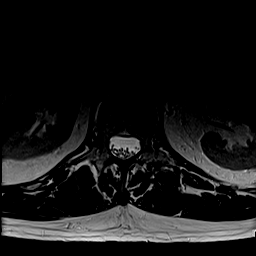

[Series 9: T1 · axial · 4.0mm · 0.39mm/px · z∈[-144,+87]mm · 8 of 36 slices shown (2 of 2)]
[im 1/36]
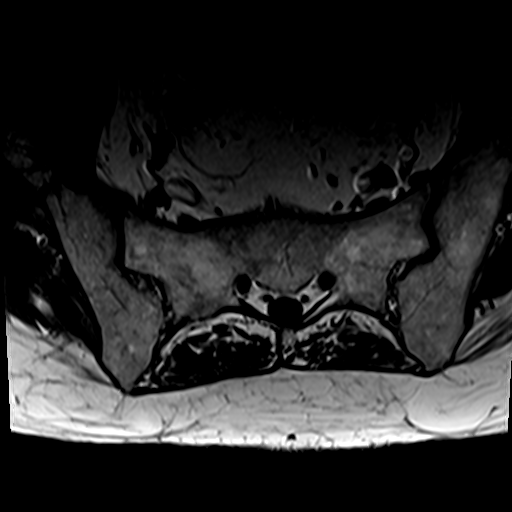
[im 6/36]
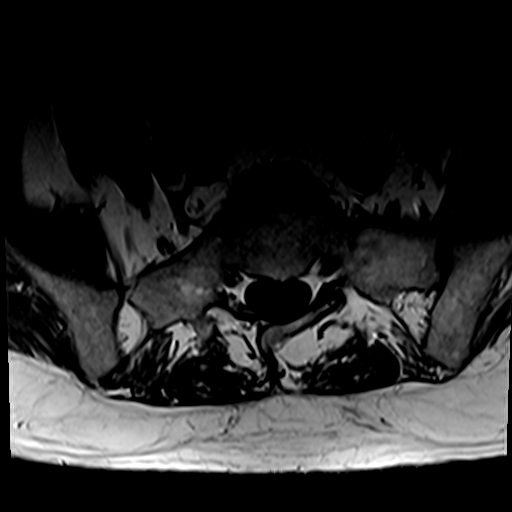
[im 11/36]
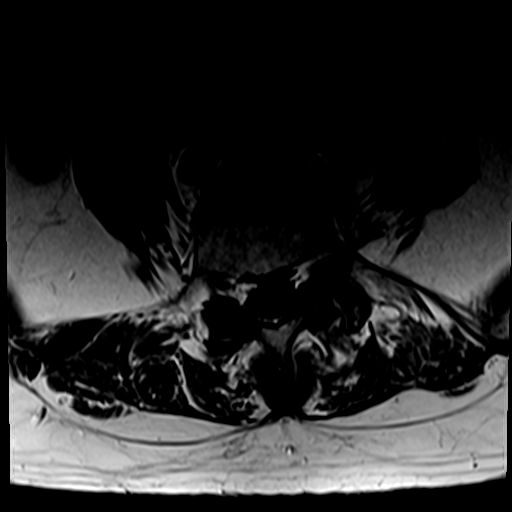
[im 17/36]
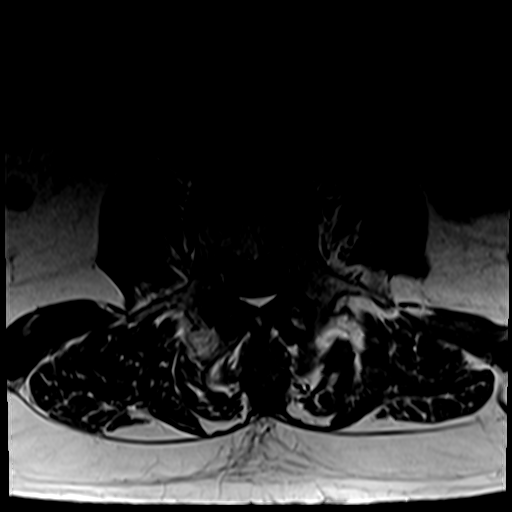
[im 19/36]
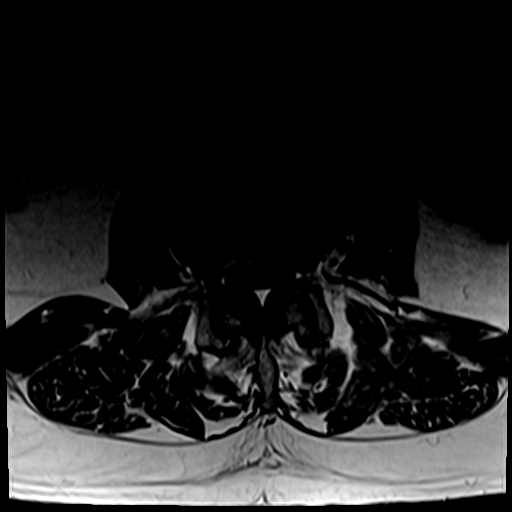
[im 25/36]
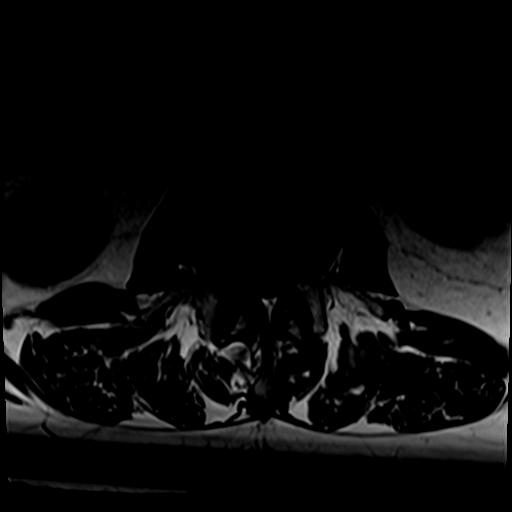
[im 30/36]
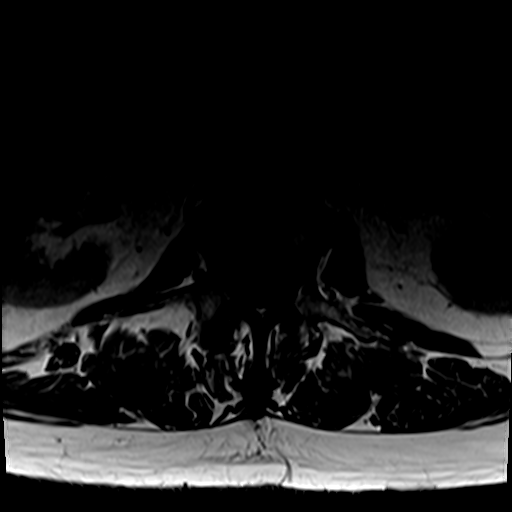
[im 36/36]
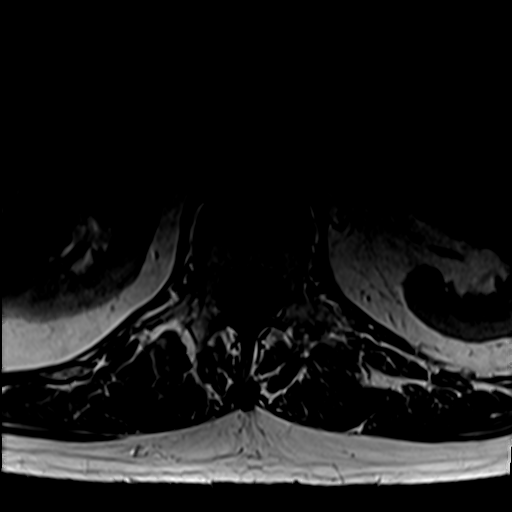

[30 of 48 positions shown; findings below may reference images not displayed]

FINDINGS: Segmentation:  Partially lumbarized S1.

Alignment: Slight left convex curvature of the upper lumbar spine
and slight right convex curvature of the lower lumbar spine. Grade 1
retrolisthesis of L2 on L3 and grade 1 anterolisthesis of L3 on L4
and L4 on L5.

Vertebrae: No fracture or suspicious osseous lesion. Moderate
left-sided degenerative endplate edema at L4-5. Mild bilateral facet
edema at L4-5.

Conus medullaris and cauda equina: The conus was largely excluded
from imaging, only partially visualized on sagittal images and
terminating at T12-L1. The cauda equina is unremarkable.

Paraspinal and other soft tissues: Unremarkable.

Disc levels:

Disc desiccation throughout the lumbar spine. Severe disc space
narrowing at L2-3 with moderate narrowing at L4-5 and L5-S1 and mild
narrowing at L3-4.

L1-2: Mild facet hypertrophy without disc herniation or stenosis.

L2-3: Retrolisthesis, circumferential disc osteophyte complex, and
mild facet and ligamentum flavum hypertrophy result in mild spinal
stenosis, mild bilateral lateral recess stenosis, and
mild-to-moderate right neural foraminal stenosis.

L3-4: Anterolisthesis with bulging uncovered disc and moderate facet
and ligamentum flavum hypertrophy result in moderate spinal stenosis
and mild right neural foraminal stenosis.

L4-5: Anterolisthesis with bulging uncovered disc, a large right
paracentral and subarticular pseudo disc extrusion with mild
superior migration, and severe facet and ligamentum flavum
hypertrophy result in severe spinal stenosis, severe right greater
than left lateral recess stenosis, and moderate right and moderate
to severe left neural foraminal stenosis. Bilateral facet joint
effusions which can be seen in the setting of instability. 5 mm cyst
along the left ligamentum flavum inferior to the disc space.
Potential bilateral L4 and L5 nerve root impingement.

L5-S1: Left eccentric disc bulging, endplate spurring, and moderate
left facet hypertrophy result in borderline to mild left lateral
recess stenosis and moderate to severe left neural foraminal
stenosis with potential left L5 nerve root impingement. No spinal
stenosis.

S1-2: Transitional anatomy with rudimentary disc.  No stenosis.
IMPRESSION: 1. Transitional lumbosacral anatomy.
2. Advanced L4-5 facet arthrosis with grade 1 anterolisthesis and a
large right paracentral/subarticular pseudo disc extrusion
contributing to severe spinal and neural foraminal stenosis.
3. Moderate spinal stenosis at L3-4 and mild spinal stenosis at
L2-3.
4. Moderate to severe left neural foraminal stenosis at L5-S1.

## 2020-07-02 ENCOUNTER — Other Ambulatory Visit: Payer: Self-pay | Admitting: Nurse Practitioner

## 2020-07-03 DIAGNOSIS — M48061 Spinal stenosis, lumbar region without neurogenic claudication: Secondary | ICD-10-CM | POA: Diagnosis not present

## 2020-07-03 DIAGNOSIS — M5441 Lumbago with sciatica, right side: Secondary | ICD-10-CM | POA: Diagnosis not present

## 2020-07-03 DIAGNOSIS — M5442 Lumbago with sciatica, left side: Secondary | ICD-10-CM | POA: Diagnosis not present

## 2020-07-03 DIAGNOSIS — M5116 Intervertebral disc disorders with radiculopathy, lumbar region: Secondary | ICD-10-CM | POA: Diagnosis not present

## 2020-07-03 DIAGNOSIS — G8929 Other chronic pain: Secondary | ICD-10-CM | POA: Diagnosis not present

## 2020-07-04 DIAGNOSIS — M5441 Lumbago with sciatica, right side: Secondary | ICD-10-CM | POA: Diagnosis not present

## 2020-07-04 DIAGNOSIS — M5442 Lumbago with sciatica, left side: Secondary | ICD-10-CM | POA: Diagnosis not present

## 2020-07-23 DIAGNOSIS — M48061 Spinal stenosis, lumbar region without neurogenic claudication: Secondary | ICD-10-CM | POA: Diagnosis not present

## 2020-07-23 DIAGNOSIS — G8929 Other chronic pain: Secondary | ICD-10-CM | POA: Diagnosis not present

## 2020-07-23 DIAGNOSIS — M5441 Lumbago with sciatica, right side: Secondary | ICD-10-CM | POA: Diagnosis not present

## 2020-07-23 DIAGNOSIS — M5442 Lumbago with sciatica, left side: Secondary | ICD-10-CM | POA: Diagnosis not present

## 2020-07-25 DIAGNOSIS — L570 Actinic keratosis: Secondary | ICD-10-CM | POA: Diagnosis not present

## 2020-07-25 DIAGNOSIS — L538 Other specified erythematous conditions: Secondary | ICD-10-CM | POA: Diagnosis not present

## 2020-07-25 DIAGNOSIS — L218 Other seborrheic dermatitis: Secondary | ICD-10-CM | POA: Diagnosis not present

## 2020-07-25 DIAGNOSIS — X32XXXA Exposure to sunlight, initial encounter: Secondary | ICD-10-CM | POA: Diagnosis not present

## 2020-07-25 DIAGNOSIS — L820 Inflamed seborrheic keratosis: Secondary | ICD-10-CM | POA: Diagnosis not present

## 2020-07-25 DIAGNOSIS — Z85828 Personal history of other malignant neoplasm of skin: Secondary | ICD-10-CM | POA: Diagnosis not present

## 2020-07-25 DIAGNOSIS — D225 Melanocytic nevi of trunk: Secondary | ICD-10-CM | POA: Diagnosis not present

## 2020-07-25 DIAGNOSIS — D2261 Melanocytic nevi of right upper limb, including shoulder: Secondary | ICD-10-CM | POA: Diagnosis not present

## 2020-07-25 DIAGNOSIS — D2262 Melanocytic nevi of left upper limb, including shoulder: Secondary | ICD-10-CM | POA: Diagnosis not present

## 2020-07-25 DIAGNOSIS — D2271 Melanocytic nevi of right lower limb, including hip: Secondary | ICD-10-CM | POA: Diagnosis not present

## 2020-08-01 DIAGNOSIS — M5442 Lumbago with sciatica, left side: Secondary | ICD-10-CM | POA: Diagnosis not present

## 2020-08-01 DIAGNOSIS — M5441 Lumbago with sciatica, right side: Secondary | ICD-10-CM | POA: Diagnosis not present

## 2020-08-05 DIAGNOSIS — Z96652 Presence of left artificial knee joint: Secondary | ICD-10-CM | POA: Diagnosis not present

## 2020-08-05 DIAGNOSIS — M1712 Unilateral primary osteoarthritis, left knee: Secondary | ICD-10-CM | POA: Diagnosis not present

## 2020-08-13 ENCOUNTER — Ambulatory Visit: Payer: PPO | Admitting: Nurse Practitioner

## 2020-08-13 DIAGNOSIS — M48061 Spinal stenosis, lumbar region without neurogenic claudication: Secondary | ICD-10-CM | POA: Diagnosis not present

## 2020-08-13 DIAGNOSIS — M5442 Lumbago with sciatica, left side: Secondary | ICD-10-CM | POA: Diagnosis not present

## 2020-08-13 DIAGNOSIS — G8929 Other chronic pain: Secondary | ICD-10-CM | POA: Diagnosis not present

## 2020-08-13 DIAGNOSIS — M5441 Lumbago with sciatica, right side: Secondary | ICD-10-CM | POA: Diagnosis not present

## 2020-08-14 ENCOUNTER — Ambulatory Visit: Payer: PPO | Admitting: Nurse Practitioner

## 2020-08-15 DIAGNOSIS — G8929 Other chronic pain: Secondary | ICD-10-CM | POA: Diagnosis not present

## 2020-08-15 DIAGNOSIS — M5442 Lumbago with sciatica, left side: Secondary | ICD-10-CM | POA: Diagnosis not present

## 2020-08-15 DIAGNOSIS — M4310 Spondylolisthesis, site unspecified: Secondary | ICD-10-CM | POA: Diagnosis not present

## 2020-08-15 DIAGNOSIS — M5441 Lumbago with sciatica, right side: Secondary | ICD-10-CM | POA: Diagnosis not present

## 2020-08-16 ENCOUNTER — Encounter: Payer: Self-pay | Admitting: Family Medicine

## 2020-08-16 ENCOUNTER — Other Ambulatory Visit: Payer: Self-pay

## 2020-08-16 ENCOUNTER — Ambulatory Visit (INDEPENDENT_AMBULATORY_CARE_PROVIDER_SITE_OTHER): Payer: PPO | Admitting: Family Medicine

## 2020-08-16 VITALS — BP 138/78 | HR 72 | Temp 98.4°F | Wt 224.0 lb

## 2020-08-16 DIAGNOSIS — R413 Other amnesia: Secondary | ICD-10-CM

## 2020-08-16 DIAGNOSIS — Z23 Encounter for immunization: Secondary | ICD-10-CM

## 2020-08-16 DIAGNOSIS — I10 Essential (primary) hypertension: Secondary | ICD-10-CM

## 2020-08-16 DIAGNOSIS — E785 Hyperlipidemia, unspecified: Secondary | ICD-10-CM

## 2020-08-16 NOTE — Progress Notes (Signed)
BP 138/78 (BP Location: Left Arm, Cuff Size: Normal)   Pulse 72   Temp 98.4 F (36.9 C) (Oral)   Wt 224 lb (101.6 kg)   SpO2 96%   BMI 30.38 kg/m    Subjective:    Patient ID: Christopher Huang, male    DOB: [birth year], 69 y.o.   MRN: [id number]  HPI: Christopher Huang is a 69 y.o. male  Chief Complaint  Patient presents with  . Hypertension  . Hyperlipidemia  . Insomnia   Having some issues with short-term memory, mainly that he forgets orders when his wife sends him for food. No other times they can remember.  HYPERTENSION / HYPERLIPIDEMIA Satisfied with current treatment? yes Duration of hypertension: chronic BP monitoring frequency: not checking BP medication side effects: no Past BP meds: lisinopril Duration of hyperlipidemia: chronic Cholesterol medication side effects: no Cholesterol supplements: niacin Past cholesterol medications: simvastatin Medication compliance: excellent compliance Aspirin: yes Recent stressors: yes Recurrent headaches: no Visual changes: no Palpitations: no Dyspnea: no Chest pain: no Lower extremity edema: no Dizzy/lightheaded: no   Relevant past medical, surgical, family and social history reviewed and updated as indicated. Interim medical history since our last visit reviewed. Allergies and medications reviewed and updated.  Review of Systems  Constitutional: Negative.   Respiratory: Negative.   Cardiovascular: Negative.   Gastrointestinal: Negative.   Musculoskeletal: Negative.   Neurological: Negative.   Psychiatric/Behavioral: Positive for sleep disturbance. Negative for agitation, behavioral problems, confusion, decreased concentration, dysphoric mood, hallucinations, self-injury and suicidal ideas. The patient is not nervous/anxious and is not hyperactive.     Per HPI unless specifically indicated above     Objective:    BP 138/78 (BP Location: Left Arm, Cuff Size: Normal)   Pulse 72   Temp 98.4 F (36.9 C) (Oral)    Wt 224 lb (101.6 kg)   SpO2 96%   BMI 30.38 kg/m   Wt Readings from Last 3 Encounters:  08/16/20 224 lb (101.6 kg)  02/09/20 219 lb (99.3 kg)  01/29/20 219 lb (99.3 kg)    Physical Exam Vitals and nursing note reviewed.  Constitutional:      General: He is not in acute distress.    Appearance: Normal appearance. He is not ill-appearing, toxic-appearing or diaphoretic.  HENT:     Head: Normocephalic and atraumatic.     Right Ear: External ear normal.     Left Ear: External ear normal.     Nose: Nose normal.     Mouth/Throat:     Mouth: Mucous membranes are moist.     Pharynx: Oropharynx is clear.  Eyes:     General: No scleral icterus.       Right eye: No discharge.        Left eye: No discharge.     Extraocular Movements: Extraocular movements intact.     Conjunctiva/sclera: Conjunctivae normal.     Pupils: Pupils are equal, round, and reactive to light.  Cardiovascular:     Rate and Rhythm: Normal rate and regular rhythm.     Pulses: Normal pulses.     Heart sounds: Normal heart sounds. No murmur heard.  No friction rub. No gallop.   Pulmonary:     Effort: Pulmonary effort is normal. No respiratory distress.     Breath sounds: Normal breath sounds. No stridor. No wheezing, rhonchi or rales.  Chest:     Chest wall: No tenderness.  Musculoskeletal:        General: Normal range  of motion.     Cervical back: Normal range of motion and neck supple.  Skin:    General: Skin is warm and dry.     Capillary Refill: Capillary refill takes less than 2 seconds.     Coloration: Skin is not jaundiced or pale.     Findings: No bruising, erythema, lesion or rash.  Neurological:     General: No focal deficit present.     Mental Status: He is alert and oriented to person, place, and time. Mental status is at baseline.  Psychiatric:        Mood and Affect: Mood normal.        Behavior: Behavior normal.        Thought Content: Thought content normal.        Judgment: Judgment  normal.     Results for orders placed or performed during the hospital encounter of 02/09/20  Glucose, capillary  Result Value Ref Range   Glucose-Capillary 106 (H) 70 - 99 mg/dL  CBC  Result Value Ref Range   WBC 14.4 (H) 4.0 - 10.5 K/uL   RBC 4.59 4.22 - 5.81 MIL/uL   Hemoglobin 14.2 13.0 - 17.0 g/dL   HCT 42.1 39 - 52 %   MCV 91.7 80.0 - 100.0 fL   MCH 30.9 26.0 - 34.0 pg   MCHC 33.7 30.0 - 36.0 g/dL   RDW 13.2 11.5 - 15.5 %   Platelets 211 150 - 400 K/uL   nRBC 0.0 0.0 - 0.2 %  Creatinine, serum  Result Value Ref Range   Creatinine, Ser 0.75 0.61 - 1.24 mg/dL   GFR calc non Af Amer >60 >60 mL/min   GFR calc Af Amer >60 >60 mL/min  CBC  Result Value Ref Range   WBC 11.2 (H) 4.0 - 10.5 K/uL   RBC 4.05 (L) 4.22 - 5.81 MIL/uL   Hemoglobin 12.3 (L) 13.0 - 17.0 g/dL   HCT 36.8 (L) 39 - 52 %   MCV 90.9 80.0 - 100.0 fL   MCH 30.4 26.0 - 34.0 pg   MCHC 33.4 30.0 - 36.0 g/dL   RDW 13.2 11.5 - 15.5 %   Platelets 212 150 - 400 K/uL   nRBC 0.0 0.0 - 0.2 %  Basic metabolic panel  Result Value Ref Range   Sodium 135 135 - 145 mmol/L   Potassium 4.2 3.5 - 5.1 mmol/L   Chloride 101 98 - 111 mmol/L   CO2 27 22 - 32 mmol/L   Glucose, Bld 176 (H) 70 - 99 mg/dL   BUN 20 8 - 23 mg/dL   Creatinine, Ser 0.73 0.61 - 1.24 mg/dL   Calcium 8.3 (L) 8.9 - 10.3 mg/dL   GFR calc non Af Amer >60 >60 mL/min   GFR calc Af Amer >60 >60 mL/min   Anion gap 7 5 - 15  CBC  Result Value Ref Range   WBC 8.4 4.0 - 10.5 K/uL   RBC 3.83 (L) 4.22 - 5.81 MIL/uL   Hemoglobin 11.8 (L) 13.0 - 17.0 g/dL   HCT 33.8 (L) 39 - 52 %   MCV 88.3 80.0 - 100.0 fL   MCH 30.8 26.0 - 34.0 pg   MCHC 34.9 30.0 - 36.0 g/dL   RDW 13.3 11.5 - 15.5 %   Platelets 192 150 - 400 K/uL   nRBC 0.0 0.0 - 0.2 %  Basic metabolic panel  Result Value Ref Range   Sodium 134 (L) 135 - 145 mmol/L  Potassium 3.9 3.5 - 5.1 mmol/L   Chloride 97 (L) 98 - 111 mmol/L   CO2 27 22 - 32 mmol/L   Glucose, Bld 124 (H) 70 - 99 mg/dL    BUN 16 8 - 23 mg/dL   Creatinine, Ser 0.82 0.61 - 1.24 mg/dL   Calcium 8.5 (L) 8.9 - 10.3 mg/dL   GFR calc non Af Amer >60 >60 mL/min   GFR calc Af Amer >60 >60 mL/min   Anion gap 10 5 - 15  ABO/Rh  Result Value Ref Range   ABO/RH(D)      O POS Performed at [facility], [address] [place], [place] [address]       Assessment & Plan:   Problem List Items Addressed This Visit      Cardiovascular and Mediastinum   Hypertension - Primary    Under good control on current regimen. Continue current regimen. Continue to monitor. Call with any concerns. Refills given. Labs drawn today.       Relevant Medications   ASPIRIN 81 PO   Other Relevant Orders   Comprehensive metabolic panel     Other   Hyperlipidemia    Under good control on current regimen. Continue current regimen. Continue to monitor. Call with any concerns. Refills given. Labs drawn today.       Relevant Medications   ASPIRIN 81 PO   Other Relevant Orders   Comprehensive metabolic panel   Lipid Panel w/o Chol/HDL Ratio    Other Visit Diagnoses    Poor short term memory       Discussed different tricks to help. If getting worse or not getting better, let us know. Continue to monitor.    Flu vaccine need       Flu shot given today.   Relevant Orders   Flu Vaccine QUAD High Dose(Fluad) (Completed)       Follow up plan: Return in about 6 months (around 02/13/2021) for physical.

## 2020-08-16 NOTE — Assessment & Plan Note (Signed)
Under good control on current regimen. Continue current regimen. Continue to monitor. Call with any concerns. Refills given. Labs drawn today.   

## 2020-08-17 LAB — LIPID PANEL W/O CHOL/HDL RATIO
Cholesterol, Total: 209 mg/dL — ABNORMAL HIGH (ref 100–199)
HDL: 46 mg/dL (ref >39)
LDL Chol Calc (NIH): 122 mg/dL — ABNORMAL HIGH (ref 0–99)
Triglycerides: 232 mg/dL — ABNORMAL HIGH (ref 0–149)
VLDL Cholesterol Cal: 41 mg/dL — ABNORMAL HIGH (ref 5–40)

## 2020-08-17 LAB — COMPREHENSIVE METABOLIC PANEL
ALT: 20 IU/L (ref 0–44)
AST: 19 IU/L (ref 0–40)
Albumin/Globulin Ratio: 2 (ref 1.2–2.2)
Albumin: 4.2 g/dL (ref 3.8–4.8)
Alkaline Phosphatase: 98 IU/L (ref 44–121)
BUN/Creatinine Ratio: 19 (ref 10–24)
BUN: 16 mg/dL (ref 8–27)
Bilirubin Total: 0.5 mg/dL (ref 0.0–1.2)
CO2: 26 mmol/L (ref 20–29)
Calcium: 9.2 mg/dL (ref 8.6–10.2)
Chloride: 102 mmol/L (ref 96–106)
Creatinine, Ser: 0.85 mg/dL (ref 0.76–1.27)
GFR calc Af Amer: 103 mL/min/{1.73_m2} (ref >59)
GFR calc non Af Amer: 89 mL/min/{1.73_m2} (ref >59)
Globulin, Total: 2.1 g/dL (ref 1.5–4.5)
Glucose: 111 mg/dL — ABNORMAL HIGH (ref 65–99)
Potassium: 4.8 mmol/L (ref 3.5–5.2)
Sodium: 139 mmol/L (ref 134–144)
Total Protein: 6.3 g/dL (ref 6.0–8.5)

## 2020-08-18 ENCOUNTER — Encounter: Payer: Self-pay | Admitting: Family Medicine

## 2020-08-23 DIAGNOSIS — M5441 Lumbago with sciatica, right side: Secondary | ICD-10-CM | POA: Diagnosis not present

## 2020-08-23 DIAGNOSIS — G8929 Other chronic pain: Secondary | ICD-10-CM | POA: Diagnosis not present

## 2020-08-23 DIAGNOSIS — M5442 Lumbago with sciatica, left side: Secondary | ICD-10-CM | POA: Diagnosis not present

## 2020-08-26 DIAGNOSIS — M5441 Lumbago with sciatica, right side: Secondary | ICD-10-CM | POA: Diagnosis not present

## 2020-08-26 DIAGNOSIS — G8929 Other chronic pain: Secondary | ICD-10-CM | POA: Diagnosis not present

## 2020-08-26 DIAGNOSIS — M5442 Lumbago with sciatica, left side: Secondary | ICD-10-CM | POA: Diagnosis not present

## 2020-08-30 DIAGNOSIS — M5442 Lumbago with sciatica, left side: Secondary | ICD-10-CM | POA: Diagnosis not present

## 2020-08-30 DIAGNOSIS — G8929 Other chronic pain: Secondary | ICD-10-CM | POA: Diagnosis not present

## 2020-08-30 DIAGNOSIS — M5441 Lumbago with sciatica, right side: Secondary | ICD-10-CM | POA: Diagnosis not present

## 2020-09-02 DIAGNOSIS — G8929 Other chronic pain: Secondary | ICD-10-CM | POA: Diagnosis not present

## 2020-09-02 DIAGNOSIS — M5442 Lumbago with sciatica, left side: Secondary | ICD-10-CM | POA: Diagnosis not present

## 2020-09-02 DIAGNOSIS — M5441 Lumbago with sciatica, right side: Secondary | ICD-10-CM | POA: Diagnosis not present

## 2020-09-06 DIAGNOSIS — G8929 Other chronic pain: Secondary | ICD-10-CM | POA: Diagnosis not present

## 2020-09-06 DIAGNOSIS — M5441 Lumbago with sciatica, right side: Secondary | ICD-10-CM | POA: Diagnosis not present

## 2020-09-06 DIAGNOSIS — M5442 Lumbago with sciatica, left side: Secondary | ICD-10-CM | POA: Diagnosis not present

## 2020-09-09 DIAGNOSIS — G8929 Other chronic pain: Secondary | ICD-10-CM | POA: Diagnosis not present

## 2020-09-09 DIAGNOSIS — M5441 Lumbago with sciatica, right side: Secondary | ICD-10-CM | POA: Diagnosis not present

## 2020-09-09 DIAGNOSIS — M5442 Lumbago with sciatica, left side: Secondary | ICD-10-CM | POA: Diagnosis not present

## 2020-09-13 DIAGNOSIS — G8929 Other chronic pain: Secondary | ICD-10-CM | POA: Diagnosis not present

## 2020-09-13 DIAGNOSIS — M5442 Lumbago with sciatica, left side: Secondary | ICD-10-CM | POA: Diagnosis not present

## 2020-09-13 DIAGNOSIS — M5441 Lumbago with sciatica, right side: Secondary | ICD-10-CM | POA: Diagnosis not present

## 2020-09-16 DIAGNOSIS — M5441 Lumbago with sciatica, right side: Secondary | ICD-10-CM | POA: Diagnosis not present

## 2020-09-16 DIAGNOSIS — M5442 Lumbago with sciatica, left side: Secondary | ICD-10-CM | POA: Diagnosis not present

## 2020-09-16 DIAGNOSIS — G8929 Other chronic pain: Secondary | ICD-10-CM | POA: Diagnosis not present

## 2020-09-20 DIAGNOSIS — M5441 Lumbago with sciatica, right side: Secondary | ICD-10-CM | POA: Diagnosis not present

## 2020-09-20 DIAGNOSIS — M5442 Lumbago with sciatica, left side: Secondary | ICD-10-CM | POA: Diagnosis not present

## 2020-09-20 DIAGNOSIS — G8929 Other chronic pain: Secondary | ICD-10-CM | POA: Diagnosis not present

## 2020-09-27 DIAGNOSIS — M5442 Lumbago with sciatica, left side: Secondary | ICD-10-CM | POA: Diagnosis not present

## 2020-09-27 DIAGNOSIS — G8929 Other chronic pain: Secondary | ICD-10-CM | POA: Diagnosis not present

## 2020-09-27 DIAGNOSIS — M5441 Lumbago with sciatica, right side: Secondary | ICD-10-CM | POA: Diagnosis not present

## 2020-09-27 DIAGNOSIS — M4310 Spondylolisthesis, site unspecified: Secondary | ICD-10-CM | POA: Diagnosis not present

## 2020-10-03 DIAGNOSIS — M47816 Spondylosis without myelopathy or radiculopathy, lumbar region: Secondary | ICD-10-CM | POA: Diagnosis not present

## 2020-10-31 ENCOUNTER — Other Ambulatory Visit: Payer: Self-pay | Admitting: Neurosurgery

## 2020-10-31 DIAGNOSIS — M4310 Spondylolisthesis, site unspecified: Secondary | ICD-10-CM | POA: Diagnosis not present

## 2020-11-08 DIAGNOSIS — M532X6 Spinal instabilities, lumbar region: Secondary | ICD-10-CM | POA: Diagnosis not present

## 2020-11-26 ENCOUNTER — Other Ambulatory Visit: Payer: Self-pay

## 2020-11-26 ENCOUNTER — Encounter: Payer: Self-pay | Admitting: Nurse Practitioner

## 2020-11-26 ENCOUNTER — Ambulatory Visit (INDEPENDENT_AMBULATORY_CARE_PROVIDER_SITE_OTHER): Payer: PPO | Admitting: Nurse Practitioner

## 2020-11-26 DIAGNOSIS — Z01818 Encounter for other preprocedural examination: Secondary | ICD-10-CM | POA: Insufficient documentation

## 2020-11-26 NOTE — Progress Notes (Signed)
BP 132/74   Pulse 72   Temp 98.5 F (36.9 C) (Oral)   Ht 6' 0.4" (1.839 m)   Wt 232 lb 6.4 oz (105.4 kg)   SpO2 97%   BMI 31.17 kg/m    Subjective:    Patient ID: Christopher Huang, male    DOB: 09/21/1951, 70 y.o.   MRN: [id number]  HPI: Christopher Huang is a 70 y.o. male  Chief Complaint  Patient presents with  . surgery clearance   SURGICAL CLEARANCE: Patient is here for surgical clearance. He is having L4-5 lateral interbody fusion, L4-5 posterior fusion surgery on 12/11/20 by Dr Izora Ribas. No concerns about the surgery. He will be doing physical therapy after surgery. He will be released to go home after the surgery upon approval by surgical team. He does not smoke or drinks alcohol. No cardiac or neurologic symptoms at all at this time.  Did not bring form with him for clearance today and on review of recent note with them do not see where PCP clearance is needed -- he reports nurse at the clinic told him he needed this.  Have called Dr. Izora Ribas office personally to discuss with team there -- form to be faxed.  No EKG requested on form faxed over.  Relevant past medical, surgical, family and social history reviewed and updated as indicated. Interim medical history since our last visit reviewed. Allergies and medications reviewed and updated.  Review of Systems  Constitutional: Negative for activity change, diaphoresis, fatigue and fever.  Respiratory: Negative for cough, chest tightness, shortness of breath and wheezing.   Cardiovascular: Negative for chest pain, palpitations and leg swelling.  Gastrointestinal: Negative.   Neurological: Negative.   Psychiatric/Behavioral: Negative.     Per HPI unless specifically indicated above     Objective:    BP 132/74   Pulse 72   Temp 98.5 F (36.9 C) (Oral)   Ht 6' 0.4" (1.839 m)   Wt 232 lb 6.4 oz (105.4 kg)   SpO2 97%   BMI 31.17 kg/m   Wt Readings from Last 3 Encounters:  11/26/20 232 lb 6.4 oz (105.4 kg)  08/16/20  224 lb (101.6 kg)  02/09/20 219 lb (99.3 kg)    Physical Exam Vitals and nursing note reviewed.  Constitutional:      General: He is awake. He is not in acute distress.    Appearance: He is well-developed and well-groomed. He is obese. He is not ill-appearing.  HENT:     Head: Normocephalic and atraumatic.     Right Ear: Hearing normal. No drainage.     Left Ear: Hearing normal. No drainage.  Eyes:     General: Lids are normal.        Right eye: No discharge.        Left eye: No discharge.     Conjunctiva/sclera: Conjunctivae normal.     Pupils: Pupils are equal, round, and reactive to light.  Neck:     Thyroid: No thyromegaly.     Vascular: No carotid bruit.     Trachea: Trachea normal.  Cardiovascular:     Rate and Rhythm: Normal rate and regular rhythm.     Heart sounds: Normal heart sounds, S1 normal and S2 normal. No murmur heard. No gallop.   Pulmonary:     Effort: Pulmonary effort is normal. No accessory muscle usage or respiratory distress.     Breath sounds: Normal breath sounds.  Abdominal:     General: Bowel sounds  are normal.     Palpations: Abdomen is soft. There is no hepatomegaly or splenomegaly.  Musculoskeletal:        General: Normal range of motion.     Cervical back: Normal range of motion and neck supple.     Right lower leg: No edema.     Left lower leg: No edema.  Skin:    General: Skin is warm and dry.     Capillary Refill: Capillary refill takes less than 2 seconds.     Findings: No rash.  Neurological:     Mental Status: He is alert and oriented to person, place, and time.     Deep Tendon Reflexes: Reflexes are normal and symmetric.  Psychiatric:        Attention and Perception: Attention normal.        Mood and Affect: Mood normal.        Speech: Speech normal.        Behavior: Behavior normal. Behavior is cooperative.        Thought Content: Thought content normal.    Results for orders placed or performed in visit on 08/16/20   Comprehensive metabolic panel  Result Value Ref Range   Glucose 111 (H) 65 - 99 mg/dL   BUN 16 8 - 27 mg/dL   Creatinine, Ser 7.51 0.76 - 1.27 mg/dL   GFR calc non Af Amer 89 >59 mL/min/1.73   GFR calc Af Amer 103 >59 mL/min/1.73   BUN/Creatinine Ratio 19 10 - 24   Sodium 139 134 - 144 mmol/L   Potassium 4.8 3.5 - 5.2 mmol/L   Chloride 102 96 - 106 mmol/L   CO2 26 20 - 29 mmol/L   Calcium 9.2 8.6 - 10.2 mg/dL   Total Protein 6.3 6.0 - 8.5 g/dL   Albumin 4.2 3.8 - 4.8 g/dL   Globulin, Total 2.1 1.5 - 4.5 g/dL   Albumin/Globulin Ratio 2.0 1.2 - 2.2   Bilirubin Total 0.5 0.0 - 1.2 mg/dL   Alkaline Phosphatase 98 44 - 121 IU/L   AST 19 0 - 40 IU/L   ALT 20 0 - 44 IU/L  Lipid Panel w/o Chol/HDL Ratio  Result Value Ref Range   Cholesterol, Total 209 (H) 100 - 199 mg/dL   Triglycerides 025 (H) 0 - 149 mg/dL   HDL 46 >85 mg/dL   VLDL Cholesterol Cal 41 (H) 5 - 40 mg/dL   LDL Chol Calc (NIH) 277 (H) 0 - 99 mg/dL      Assessment & Plan:   Problem List Items Addressed This Visit      Other   Preoperative clearance    Form reviewed with patient and wife at bedside -- no labs or EKG requested, appears these are to be performed by preop clinic at [facility] on review of chart.  Educated patient and wife to hold ASA, CoQ10, Meloxicam, an Ibuprofen for 5 days prior to surgery and 5 days after to avoid bleeding risks.  Patient overall is optimized for surgical procedure.  Form signed and faxed.         Time: 21 minutes, >50% spent counseling/or care coordination  Follow up plan: Return in about 6 months (around 05/26/2021) for Annual physical -- to meet new PCP (was [facility] patient).

## 2020-11-26 NOTE — Patient Instructions (Signed)
DASH Eating Plan DASH stands for "Dietary Approaches to Stop Hypertension." The DASH eating plan is a healthy eating plan that has been shown to reduce high blood pressure (hypertension). It may also reduce your risk for type 2 diabetes, heart disease, and stroke. The DASH eating plan may also help with weight loss. What are tips for following this plan?  General guidelines  Avoid eating more than 2,300 mg (milligrams) of salt (sodium) a day. If you have hypertension, you may need to reduce your sodium intake to 1,500 mg a day.  Limit alcohol intake to no more than 1 drink a day for nonpregnant women and 2 drinks a day for men. One drink equals 12 oz of beer, 5 oz of wine, or 1 oz of hard liquor.  Work with your health care provider to maintain a healthy body weight or to lose weight. Ask what an ideal weight is for you.  Get at least 30 minutes of exercise that causes your heart to beat faster (aerobic exercise) most days of the week. Activities may include walking, swimming, or biking.  Work with your health care provider or diet and nutrition specialist (dietitian) to adjust your eating plan to your individual calorie needs. Reading food labels   Check food labels for the amount of sodium per serving. Choose foods with less than 5 percent of the Daily Value of sodium. Generally, foods with less than 300 mg of sodium per serving fit into this eating plan.  To find whole grains, look for the word "whole" as the first word in the ingredient list. Shopping  Buy products labeled as "low-sodium" or "no salt added."  Buy fresh foods. Avoid canned foods and premade or frozen meals. Cooking  Avoid adding salt when cooking. Use salt-free seasonings or herbs instead of table salt or sea salt. Check with your health care provider or pharmacist before using salt substitutes.  Do not fry foods. Cook foods using healthy methods such as baking, boiling, grilling, and broiling instead.  Cook with  heart-healthy oils, such as olive, canola, soybean, or sunflower oil. Meal planning  Eat a balanced diet that includes: ? 5 or more servings of fruits and vegetables each day. At each meal, try to fill half of your plate with fruits and vegetables. ? Up to 6-8 servings of whole grains each day. ? Less than 6 oz of lean meat, poultry, or fish each day. A 3-oz serving of meat is about the same size as a deck of cards. One egg equals 1 oz. ? 2 servings of low-fat dairy each day. ? A serving of nuts, seeds, or beans 5 times each week. ? Heart-healthy fats. Healthy fats called Omega-3 fatty acids are found in foods such as flaxseeds and coldwater fish, like sardines, salmon, and mackerel.  Limit how much you eat of the following: ? Canned or prepackaged foods. ? Food that is high in trans fat, such as fried foods. ? Food that is high in saturated fat, such as fatty meat. ? Sweets, desserts, sugary drinks, and other foods with added sugar. ? Full-fat dairy products.  Do not salt foods before eating.  Try to eat at least 2 vegetarian meals each week.  Eat more home-cooked food and less restaurant, buffet, and fast food.  When eating at a restaurant, ask that your food be prepared with less salt or no salt, if possible. What foods are recommended? The items listed may not be a complete list. Talk with your dietitian about   what dietary choices are best for you. Grains Whole-grain or whole-wheat bread. Whole-grain or whole-wheat pasta. Brown rice. Oatmeal. Quinoa. Bulgur. Whole-grain and low-sodium cereals. Pita bread. Low-fat, low-sodium crackers. Whole-wheat flour tortillas. Vegetables Fresh or frozen vegetables (raw, steamed, roasted, or grilled). Low-sodium or reduced-sodium tomato and vegetable juice. Low-sodium or reduced-sodium tomato sauce and tomato paste. Low-sodium or reduced-sodium canned vegetables. Fruits All fresh, dried, or frozen fruit. Canned fruit in natural juice (without  added sugar). Meat and other protein foods Skinless chicken or turkey. Ground chicken or turkey. Pork with fat trimmed off. Fish and seafood. Egg whites. Dried beans, peas, or lentils. Unsalted nuts, nut butters, and seeds. Unsalted canned beans. Lean cuts of beef with fat trimmed off. Low-sodium, lean deli meat. Dairy Low-fat (1%) or fat-free (skim) milk. Fat-free, low-fat, or reduced-fat cheeses. Nonfat, low-sodium ricotta or cottage cheese. Low-fat or nonfat yogurt. Low-fat, low-sodium cheese. Fats and oils Soft margarine without trans fats. Vegetable oil. Low-fat, reduced-fat, or light mayonnaise and salad dressings (reduced-sodium). Canola, safflower, olive, soybean, and sunflower oils. Avocado. Seasoning and other foods Herbs. Spices. Seasoning mixes without salt. Unsalted popcorn and pretzels. Fat-free sweets. What foods are not recommended? The items listed may not be a complete list. Talk with your dietitian about what dietary choices are best for you. Grains Baked goods made with fat, such as croissants, muffins, or some breads. Dry pasta or rice meal packs. Vegetables Creamed or fried vegetables. Vegetables in a cheese sauce. Regular canned vegetables (not low-sodium or reduced-sodium). Regular canned tomato sauce and paste (not low-sodium or reduced-sodium). Regular tomato and vegetable juice (not low-sodium or reduced-sodium). Pickles. Olives. Fruits Canned fruit in a light or heavy syrup. Fried fruit. Fruit in cream or butter sauce. Meat and other protein foods Fatty cuts of meat. Ribs. Fried meat. Bacon. Sausage. Bologna and other processed lunch meats. Salami. Fatback. Hotdogs. Bratwurst. Salted nuts and seeds. Canned beans with added salt. Canned or smoked fish. Whole eggs or egg yolks. Chicken or turkey with skin. Dairy Whole or 2% milk, cream, and half-and-half. Whole or full-fat cream cheese. Whole-fat or sweetened yogurt. Full-fat cheese. Nondairy creamers. Whipped toppings.  Processed cheese and cheese spreads. Fats and oils Butter. Stick margarine. Lard. Shortening. Ghee. Bacon fat. Tropical oils, such as coconut, palm kernel, or palm oil. Seasoning and other foods Salted popcorn and pretzels. Onion salt, garlic salt, seasoned salt, table salt, and sea salt. Worcestershire sauce. Tartar sauce. Barbecue sauce. Teriyaki sauce. Soy sauce, including reduced-sodium. Steak sauce. Canned and packaged gravies. Fish sauce. Oyster sauce. Cocktail sauce. Horseradish that you find on the shelf. Ketchup. Mustard. Meat flavorings and tenderizers. Bouillon cubes. Hot sauce and Tabasco sauce. Premade or packaged marinades. Premade or packaged taco seasonings. Relishes. Regular salad dressings. Where to find more information:  National Heart, Lung, and Blood Institute: www.nhlbi.nih.gov  American Heart Association: www.heart.org Summary  The DASH eating plan is a healthy eating plan that has been shown to reduce high blood pressure (hypertension). It may also reduce your risk for type 2 diabetes, heart disease, and stroke.  With the DASH eating plan, you should limit salt (sodium) intake to 2,300 mg a day. If you have hypertension, you may need to reduce your sodium intake to 1,500 mg a day.  When on the DASH eating plan, aim to eat more fresh fruits and vegetables, whole grains, lean proteins, low-fat dairy, and heart-healthy fats.  Work with your health care provider or diet and nutrition specialist (dietitian) to adjust your eating plan to your   individual calorie needs. This information is not intended to replace advice given to you by your health care provider. Make sure you discuss any questions you have with your health care provider. Document Revised: 10/22/2017 Document Reviewed: 11/02/2016 Elsevier Patient Education  2020 Elsevier Inc.  

## 2020-11-26 NOTE — Assessment & Plan Note (Signed)
Form reviewed with patient and wife at bedside -- no labs or EKG requested, appears these are to be performed by preop clinic at [facility] on review of chart.  Educated patient and wife to hold ASA, CoQ10, Meloxicam, an Ibuprofen for 5 days prior to surgery and 5 days after to avoid bleeding risks.  Patient overall is optimized for surgical procedure.  Form signed and faxed.

## 2020-12-03 ENCOUNTER — Other Ambulatory Visit: Payer: Self-pay

## 2020-12-03 ENCOUNTER — Ambulatory Visit
Admission: RE | Admit: 2020-12-03 | Discharge: 2020-12-03 | Disposition: A | Discharge status: Home / Self Care | Payer: PPO | Source: Ambulatory Visit | Attending: Neurosurgery | Admitting: Neurosurgery

## 2020-12-03 DIAGNOSIS — Z01818 Encounter for other preprocedural examination: Secondary | ICD-10-CM | POA: Insufficient documentation

## 2020-12-03 DIAGNOSIS — I10 Essential (primary) hypertension: Secondary | ICD-10-CM | POA: Insufficient documentation

## 2020-12-03 DIAGNOSIS — I491 Atrial premature depolarization: Secondary | ICD-10-CM | POA: Diagnosis not present

## 2020-12-03 LAB — CBC
HCT: 45.9 % (ref 39.0–52.0)
Hemoglobin: 16 g/dL (ref 13.0–17.0)
MCH: 31.4 pg (ref 26.0–34.0)
MCHC: 34.9 g/dL (ref 30.0–36.0)
MCV: 90.2 fL (ref 80.0–100.0)
Platelets: 252 10*3/uL (ref 150–400)
RBC: 5.09 MIL/uL (ref 4.22–5.81)
RDW: 12.9 % (ref 11.5–15.5)
WBC: 6.1 10*3/uL (ref 4.0–10.5)
nRBC: 0 % (ref 0.0–0.2)

## 2020-12-03 LAB — BASIC METABOLIC PANEL
Anion gap: 8 (ref 5–15)
BUN: 17 mg/dL (ref 8–23)
CO2: 25 mmol/L (ref 22–32)
Calcium: 9.2 mg/dL (ref 8.9–10.3)
Chloride: 104 mmol/L (ref 98–111)
Creatinine, Ser: 0.77 mg/dL (ref 0.61–1.24)
GFR, Estimated: >60 mL/min (ref >60)
Glucose, Bld: 101 mg/dL — ABNORMAL HIGH (ref 70–99)
Potassium: 3.8 mmol/L (ref 3.5–5.1)
Sodium: 137 mmol/L (ref 135–145)

## 2020-12-03 LAB — SURGICAL PCR SCREEN
MRSA, PCR: NEGATIVE
Staphylococcus aureus: POSITIVE — AB

## 2020-12-03 LAB — URINALYSIS, ROUTINE W REFLEX MICROSCOPIC
Bilirubin Urine: NEGATIVE
Glucose, UA: NEGATIVE mg/dL
Hgb urine dipstick: NEGATIVE
Ketones, ur: NEGATIVE mg/dL
Leukocytes,Ua: NEGATIVE
Nitrite: NEGATIVE
Protein, ur: NEGATIVE mg/dL
Specific Gravity, Urine: 1.021 (ref 1.005–1.030)
pH: 5 (ref 5.0–8.0)

## 2020-12-03 LAB — TYPE AND SCREEN
ABO/RH(D): O POS
Antibody Screen: NEGATIVE

## 2020-12-03 LAB — APTT: aPTT: 32 seconds (ref 24–36)

## 2020-12-03 LAB — PROTIME-INR
INR: 1 (ref 0.8–1.2)
Prothrombin Time: 12.7 seconds (ref 11.4–15.2)

## 2020-12-03 NOTE — Patient Instructions (Signed)
Your procedure is scheduled on:  Wednesday 12/11/20.  Report to THE FIRST FLOOR REGISTRATION DESK IN THE MEDICAL MALL ON THE MORNING OF SURGERY FIRST, THEN YOU WILL CHECK IN AT THE SURGERY INFORMATION DESK LOCATED OUTSIDE THE SAME DAY SURGERY DEPARTMENT LOCATED ON 2ND FLOOR MEDICAL MALL ENTRANCE.  To find out your arrival time please call [phone] between 1PM - 3PM on Tuesday 12/11/19.   Remember: Instructions that are not followed completely may result in serious medical risk, up to and including death, or upon the discretion of your surgeon and anesthesiologist your surgery may need to be rescheduled.     __X__ 1. Do not eat food after midnight the night before your procedure.                 No gum chewing or hard candies. You may drink clear liquids up to 2 hours                 before you are scheduled to arrive for your surgery- DO NOT drink clear                 liquids within 2 hours of the start of your surgery.                 Clear Liquids include:  water, apple juice without pulp, clear carbohydrate                 drink such as Clearfast or Gatorade, Black Coffee or Tea (Do not add                 milk or creamer to coffee or tea).  __X__2.  On the morning of surgery brush your teeth with toothpaste and water, you may rinse your mouth with mouthwash if you wish.  Do not swallow any toothpaste or mouthwash.    __X__ 3.  No Alcohol for 24 hours before or after surgery.  __X__ 4.  Do Not Smoke or use e-cigarettes For 24 Hours Prior to Your Surgery.                 Do not use any chewable tobacco products for at least 6 hours prior to                 surgery.  __X__5.  Notify your doctor if there is any change in your medical condition      (cold, fever, infections).      Do NOT wear jewelry, make-up, hairpins, clips or nail polish. Do NOT wear lotions, powders, or perfumes.  Do NOT shave 48 hours prior to surgery. Men may shave face and neck. Do NOT bring valuables to  the hospital.     [facility] is not responsible for any belongings or valuables.   Contacts, dentures/partials or body piercings may not be worn into surgery. Bring a case for your contacts, glasses or hearing aids, a denture cup will be supplied.    Patients discharged the day of surgery will not be allowed to drive home.     __X__ Take these medicines the morning of surgery with A SIP OF WATER:     1. fluticasone (FLONASE)  2. gabapentin (NEURONTIN)    __X__ Use CHG Soap as directed  __X__ Stop Blood Thinners: Aspirin. You have reported that you already stopped taking this in preparation for your surgery.   __X__ Stop Anti-inflammatories 7 days before surgery such as Advil, Ibuprofen, Motrin, BC or Goodies Powder,  Naprosyn, Naproxen, Aleve, Aspirin, Meloxicam. May take Tylenol if needed for pain or discomfort.   __X__Do not start taking any new herbal supplements or vitamins prior to your procedure.  __X__ Stop the following herbal supplements or vitamins:  Coenzyme Q10    Wear comfortable clothing (specific to your surgery type) to the hospital.  Plan for stool softeners for home use; pain medications have a tendency to cause constipation. You can also help prevent constipation by eating foods high in fiber such as fruits and vegetables and drinking plenty of fluids as your diet allows.  After surgery, you can prevent lung complications by doing breathing exercises.Take deep breaths and cough every 1-2 hours. Your doctor may order a device called an Incentive Spirometer to help you take deep breaths.  Please call the [facility] Department at [phone] if you have any questions about these instructions.

## 2020-12-09 ENCOUNTER — Other Ambulatory Visit: Admission: RE | Admit: 2020-12-09 | Payer: PPO | Source: Ambulatory Visit

## 2020-12-11 ENCOUNTER — Inpatient Hospital Stay: Admit: 2020-12-11 | Payer: PPO | Admitting: Neurosurgery

## 2020-12-11 SURGERY — ANTERIOR LATERAL LUMBAR FUSION WITH PERCUTANEOUS SCREW 1 LEVEL
Anesthesia: General

## 2020-12-30 ENCOUNTER — Other Ambulatory Visit: Payer: Self-pay | Admitting: Neurosurgery

## 2021-01-13 DIAGNOSIS — L821 Other seborrheic keratosis: Secondary | ICD-10-CM | POA: Diagnosis not present

## 2021-01-13 DIAGNOSIS — D2262 Melanocytic nevi of left upper limb, including shoulder: Secondary | ICD-10-CM | POA: Diagnosis not present

## 2021-01-13 DIAGNOSIS — D485 Neoplasm of uncertain behavior of skin: Secondary | ICD-10-CM | POA: Diagnosis not present

## 2021-01-13 DIAGNOSIS — C44519 Basal cell carcinoma of skin of other part of trunk: Secondary | ICD-10-CM | POA: Diagnosis not present

## 2021-01-13 DIAGNOSIS — X32XXXA Exposure to sunlight, initial encounter: Secondary | ICD-10-CM | POA: Diagnosis not present

## 2021-01-13 DIAGNOSIS — L570 Actinic keratosis: Secondary | ICD-10-CM | POA: Diagnosis not present

## 2021-01-13 DIAGNOSIS — D2261 Melanocytic nevi of right upper limb, including shoulder: Secondary | ICD-10-CM | POA: Diagnosis not present

## 2021-01-13 DIAGNOSIS — D2272 Melanocytic nevi of left lower limb, including hip: Secondary | ICD-10-CM | POA: Diagnosis not present

## 2021-01-13 DIAGNOSIS — Z85828 Personal history of other malignant neoplasm of skin: Secondary | ICD-10-CM | POA: Diagnosis not present

## 2021-01-21 DIAGNOSIS — C44519 Basal cell carcinoma of skin of other part of trunk: Secondary | ICD-10-CM | POA: Diagnosis not present

## 2021-01-21 DIAGNOSIS — B372 Candidiasis of skin and nail: Secondary | ICD-10-CM | POA: Diagnosis not present

## 2021-01-22 ENCOUNTER — Other Ambulatory Visit: Payer: Self-pay | Admitting: Nurse Practitioner

## 2021-01-22 MED ORDER — ROSUVASTATIN CALCIUM 20 MG PO TABS: ORAL_TABLET | ORAL | 3 refills | Status: DC

## 2021-01-24 ENCOUNTER — Other Ambulatory Visit: Payer: Self-pay | Admitting: Nurse Practitioner

## 2021-01-24 DIAGNOSIS — J302 Other seasonal allergic rhinitis: Secondary | ICD-10-CM

## 2021-01-24 MED ORDER — FLUTICASONE PROPIONATE 50 MCG/ACT NA SUSP: NASAL | 3 refills | Status: DC

## 2021-01-27 ENCOUNTER — Telehealth: Payer: Self-pay | Admitting: Nurse Practitioner

## 2021-01-27 ENCOUNTER — Other Ambulatory Visit
Admission: RE | Admit: 2021-01-27 | Discharge: 2021-01-27 | Disposition: A | Discharge status: Home / Self Care | Payer: PPO | Source: Ambulatory Visit | Attending: Neurosurgery | Admitting: Neurosurgery

## 2021-01-27 ENCOUNTER — Other Ambulatory Visit: Payer: Self-pay

## 2021-01-27 DIAGNOSIS — I10 Essential (primary) hypertension: Secondary | ICD-10-CM | POA: Diagnosis present

## 2021-01-27 DIAGNOSIS — Z96652 Presence of left artificial knee joint: Secondary | ICD-10-CM | POA: Diagnosis present

## 2021-01-27 DIAGNOSIS — M4310 Spondylolisthesis, site unspecified: Secondary | ICD-10-CM | POA: Diagnosis not present

## 2021-01-27 DIAGNOSIS — M47816 Spondylosis without myelopathy or radiculopathy, lumbar region: Secondary | ICD-10-CM | POA: Diagnosis present

## 2021-01-27 DIAGNOSIS — M4316 Spondylolisthesis, lumbar region: Secondary | ICD-10-CM | POA: Diagnosis present

## 2021-01-27 DIAGNOSIS — Z20822 Contact with and (suspected) exposure to covid-19: Secondary | ICD-10-CM | POA: Diagnosis present

## 2021-01-27 DIAGNOSIS — M48061 Spinal stenosis, lumbar region without neurogenic claudication: Secondary | ICD-10-CM | POA: Diagnosis present

## 2021-01-27 DIAGNOSIS — Z79899 Other long term (current) drug therapy: Secondary | ICD-10-CM | POA: Diagnosis not present

## 2021-01-27 DIAGNOSIS — Z4889 Encounter for other specified surgical aftercare: Secondary | ICD-10-CM | POA: Diagnosis not present

## 2021-01-27 DIAGNOSIS — M5136 Other intervertebral disc degeneration, lumbar region: Secondary | ICD-10-CM | POA: Diagnosis present

## 2021-01-27 DIAGNOSIS — Z7982 Long term (current) use of aspirin: Secondary | ICD-10-CM | POA: Diagnosis not present

## 2021-01-27 DIAGNOSIS — M4326 Fusion of spine, lumbar region: Secondary | ICD-10-CM | POA: Diagnosis not present

## 2021-01-27 DIAGNOSIS — Z01812 Encounter for preprocedural laboratory examination: Secondary | ICD-10-CM | POA: Insufficient documentation

## 2021-01-27 DIAGNOSIS — M47817 Spondylosis without myelopathy or radiculopathy, lumbosacral region: Secondary | ICD-10-CM | POA: Diagnosis present

## 2021-01-27 DIAGNOSIS — Z981 Arthrodesis status: Secondary | ICD-10-CM | POA: Diagnosis not present

## 2021-01-27 DIAGNOSIS — Z8546 Personal history of malignant neoplasm of prostate: Secondary | ICD-10-CM | POA: Diagnosis not present

## 2021-01-27 DIAGNOSIS — E785 Hyperlipidemia, unspecified: Secondary | ICD-10-CM | POA: Diagnosis present

## 2021-01-27 DIAGNOSIS — M4807 Spinal stenosis, lumbosacral region: Secondary | ICD-10-CM | POA: Diagnosis present

## 2021-01-27 DIAGNOSIS — Z885 Allergy status to narcotic agent status: Secondary | ICD-10-CM | POA: Diagnosis not present

## 2021-01-27 LAB — SARS CORONAVIRUS 2 (TAT 6-24 HRS): SARS Coronavirus 2: NEGATIVE

## 2021-01-27 NOTE — Telephone Encounter (Signed)
Copied from [place] [phone]. Topic: Medicare AWV >> Jan 27, 2021  1:24 PM Weston Anna wrote: Reason for CRM: Left message  AWVS has been rescheduled from February 05, 2021 to March 18,2022 at 3:15pm and will be a phone visit not in office -srs

## 2021-01-29 ENCOUNTER — Inpatient Hospital Stay
Admission: RE | Admit: 2021-01-29 | Discharge: 2021-01-30 | DRG: 460 | Disposition: A | Discharge status: Home Health | Payer: PPO | Attending: Neurosurgery | Admitting: Neurosurgery

## 2021-01-29 ENCOUNTER — Inpatient Hospital Stay: Payer: PPO | Admitting: Anesthesiology

## 2021-01-29 ENCOUNTER — Inpatient Hospital Stay: Payer: PPO

## 2021-01-29 ENCOUNTER — Other Ambulatory Visit: Payer: Self-pay

## 2021-01-29 ENCOUNTER — Encounter: Payer: Self-pay | Admitting: Neurosurgery

## 2021-01-29 ENCOUNTER — Encounter
Admission: RE | Disposition: A | Discharge status: Home Health | Payer: Self-pay | Source: Home / Self Care | Attending: Neurosurgery

## 2021-01-29 DIAGNOSIS — M5136 Other intervertebral disc degeneration, lumbar region: Secondary | ICD-10-CM | POA: Diagnosis present

## 2021-01-29 DIAGNOSIS — Z7982 Long term (current) use of aspirin: Secondary | ICD-10-CM | POA: Diagnosis not present

## 2021-01-29 DIAGNOSIS — M4316 Spondylolisthesis, lumbar region: Secondary | ICD-10-CM | POA: Diagnosis not present

## 2021-01-29 DIAGNOSIS — Z96652 Presence of left artificial knee joint: Secondary | ICD-10-CM | POA: Diagnosis present

## 2021-01-29 DIAGNOSIS — Z8546 Personal history of malignant neoplasm of prostate: Secondary | ICD-10-CM

## 2021-01-29 DIAGNOSIS — Z79899 Other long term (current) drug therapy: Secondary | ICD-10-CM

## 2021-01-29 DIAGNOSIS — Z981 Arthrodesis status: Secondary | ICD-10-CM | POA: Diagnosis not present

## 2021-01-29 DIAGNOSIS — Z885 Allergy status to narcotic agent status: Secondary | ICD-10-CM | POA: Diagnosis not present

## 2021-01-29 DIAGNOSIS — M47816 Spondylosis without myelopathy or radiculopathy, lumbar region: Secondary | ICD-10-CM | POA: Diagnosis present

## 2021-01-29 DIAGNOSIS — M4310 Spondylolisthesis, site unspecified: Secondary | ICD-10-CM

## 2021-01-29 DIAGNOSIS — Z4889 Encounter for other specified surgical aftercare: Secondary | ICD-10-CM | POA: Diagnosis not present

## 2021-01-29 DIAGNOSIS — E785 Hyperlipidemia, unspecified: Secondary | ICD-10-CM | POA: Diagnosis present

## 2021-01-29 DIAGNOSIS — M4807 Spinal stenosis, lumbosacral region: Secondary | ICD-10-CM | POA: Diagnosis present

## 2021-01-29 DIAGNOSIS — M48061 Spinal stenosis, lumbar region without neurogenic claudication: Secondary | ICD-10-CM | POA: Diagnosis present

## 2021-01-29 DIAGNOSIS — M47817 Spondylosis without myelopathy or radiculopathy, lumbosacral region: Secondary | ICD-10-CM | POA: Diagnosis present

## 2021-01-29 DIAGNOSIS — I10 Essential (primary) hypertension: Secondary | ICD-10-CM | POA: Diagnosis present

## 2021-01-29 DIAGNOSIS — Z20822 Contact with and (suspected) exposure to covid-19: Secondary | ICD-10-CM | POA: Diagnosis present

## 2021-01-29 DIAGNOSIS — M4326 Fusion of spine, lumbar region: Secondary | ICD-10-CM

## 2021-01-29 DIAGNOSIS — Z419 Encounter for procedure for purposes other than remedying health state, unspecified: Secondary | ICD-10-CM

## 2021-01-29 HISTORY — PX: ANTERIOR LATERAL LUMBAR FUSION WITH PERCUTANEOUS SCREW 1 LEVEL: SHX5553

## 2021-01-29 LAB — TYPE AND SCREEN
ABO/RH(D): O POS
Antibody Screen: NEGATIVE

## 2021-01-29 IMAGING — RF DG C-ARM 1-60 MIN
1 series · 7 of 7 positions shown · non-contrast
Comparison: MRI lumbar spine [DATE].

CLINICAL DATA: Surgery

EXAM:
DG C-ARM 1-60 MIN; LUMBAR SPINE - 2-3 VIEW
FLUOROSCOPY TIME:  Fluoroscopy Time:  50 seconds
Number of Acquired Spot Images: 7

[Series 1: dg x-ray · 0.20mm/px · 7 of 7 slices shown]
[im 1/7]
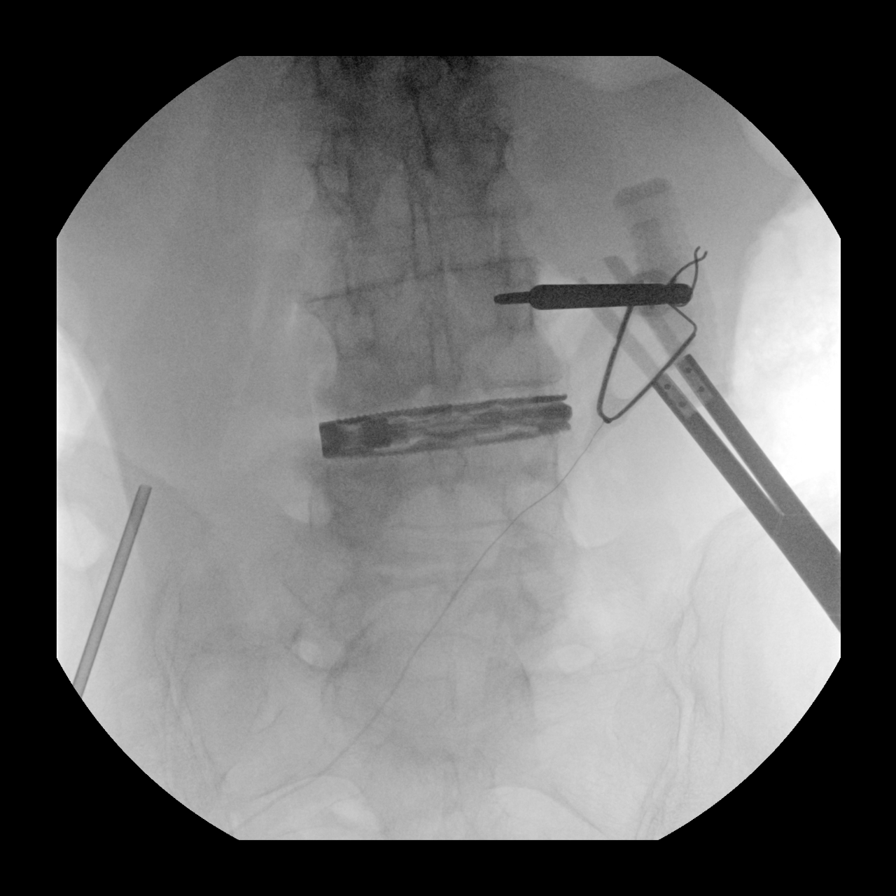
[im 2/7]
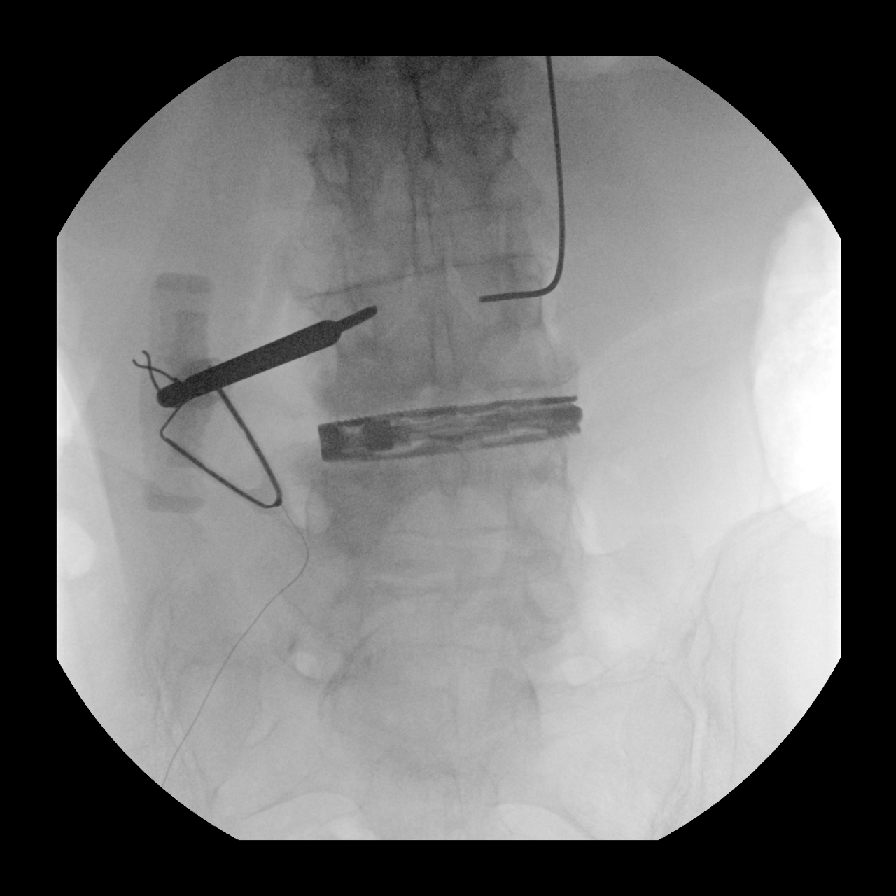
[im 3/7]
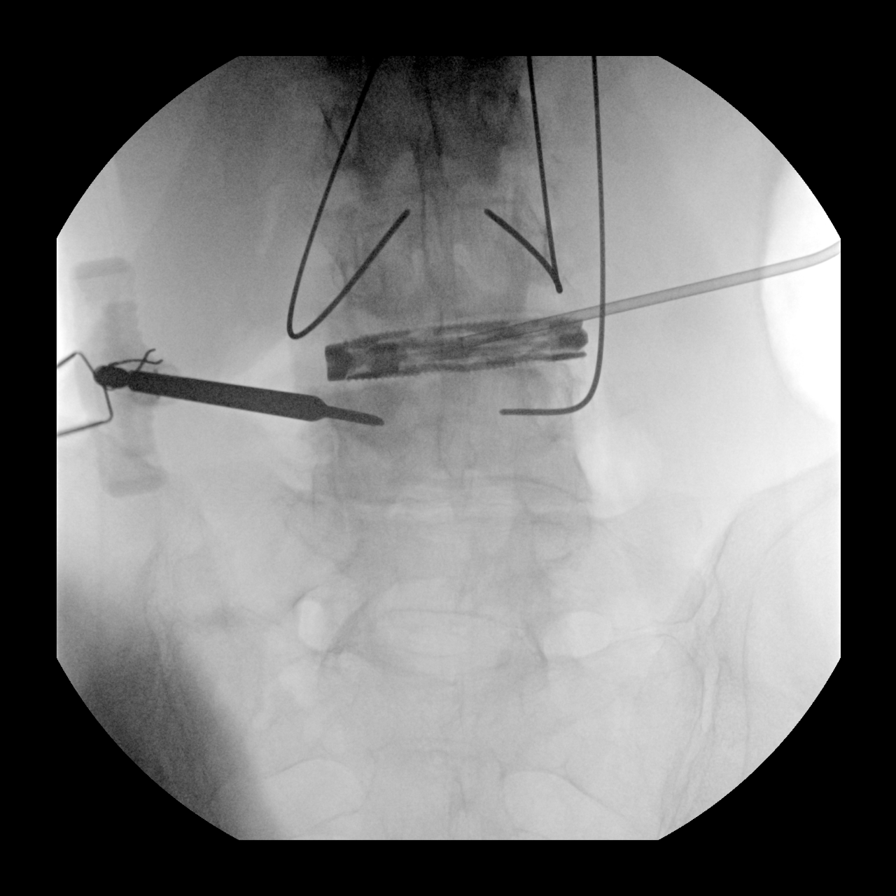
[im 4/7]
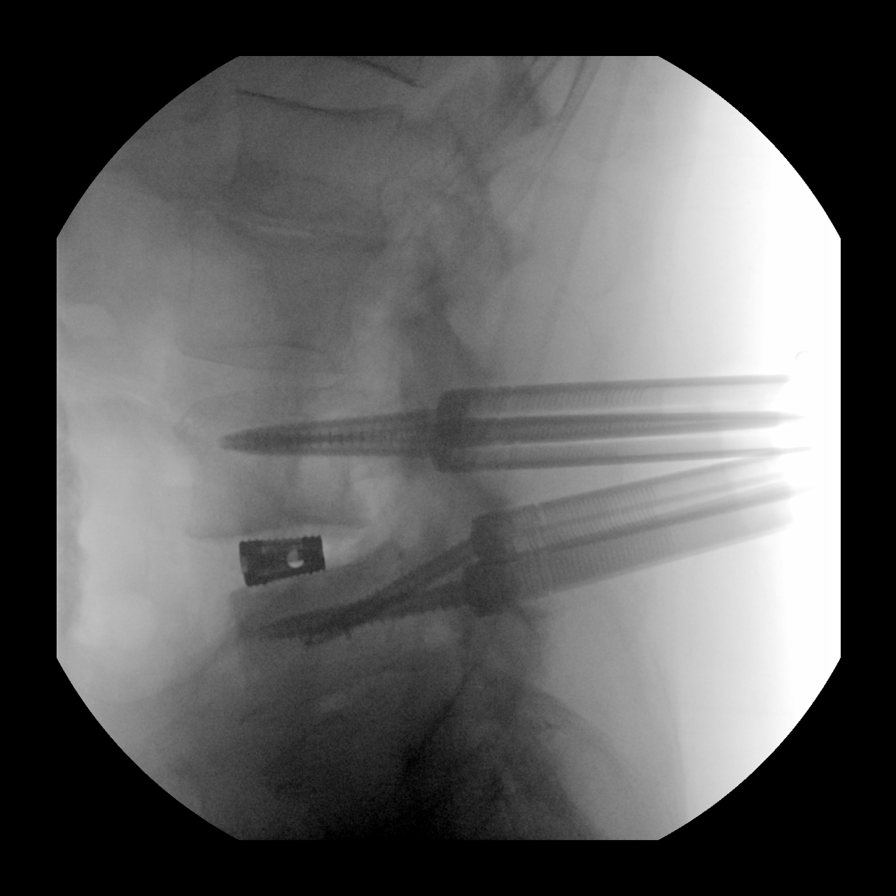
[im 5/7]
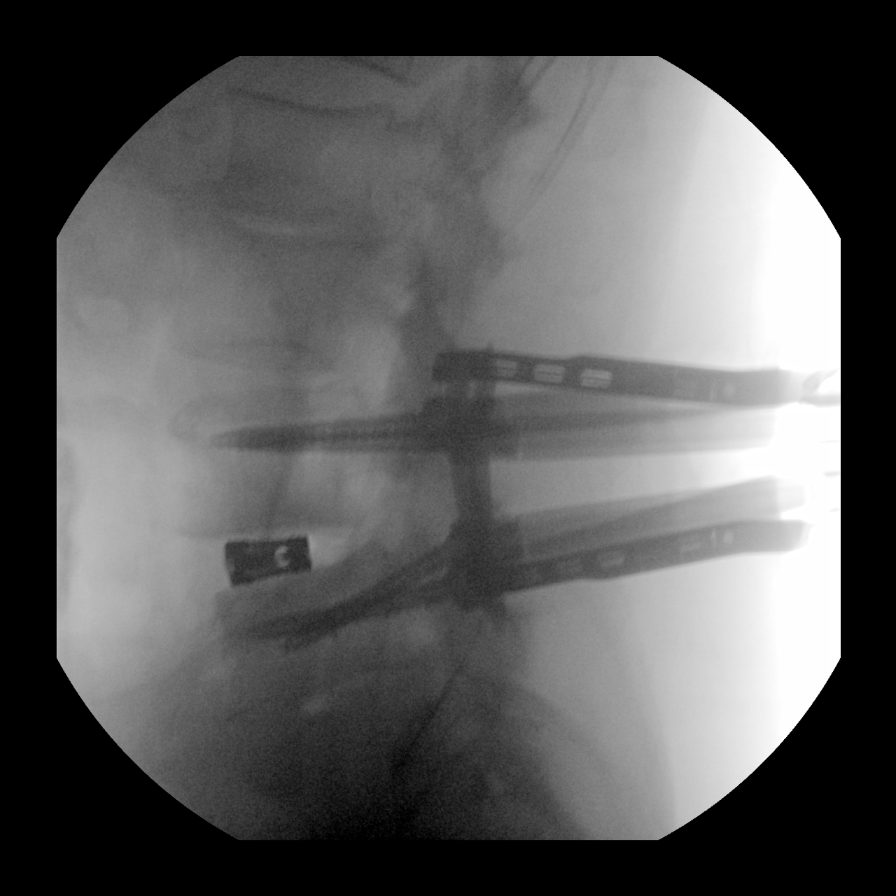
[im 6/7]
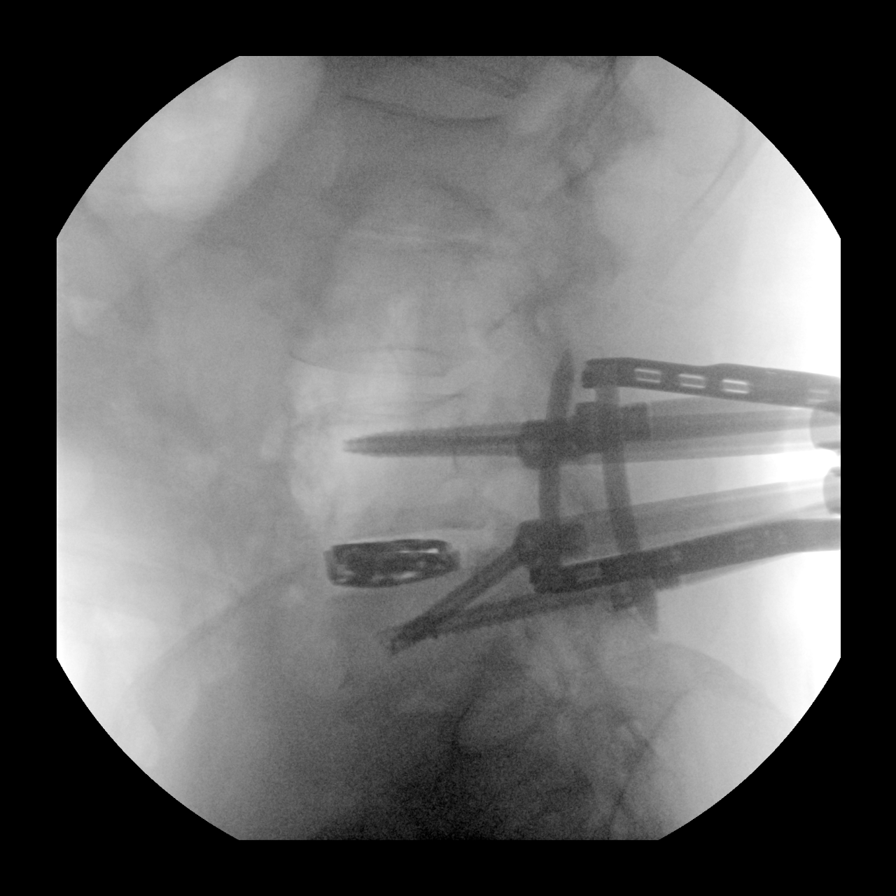
[im 7/7]
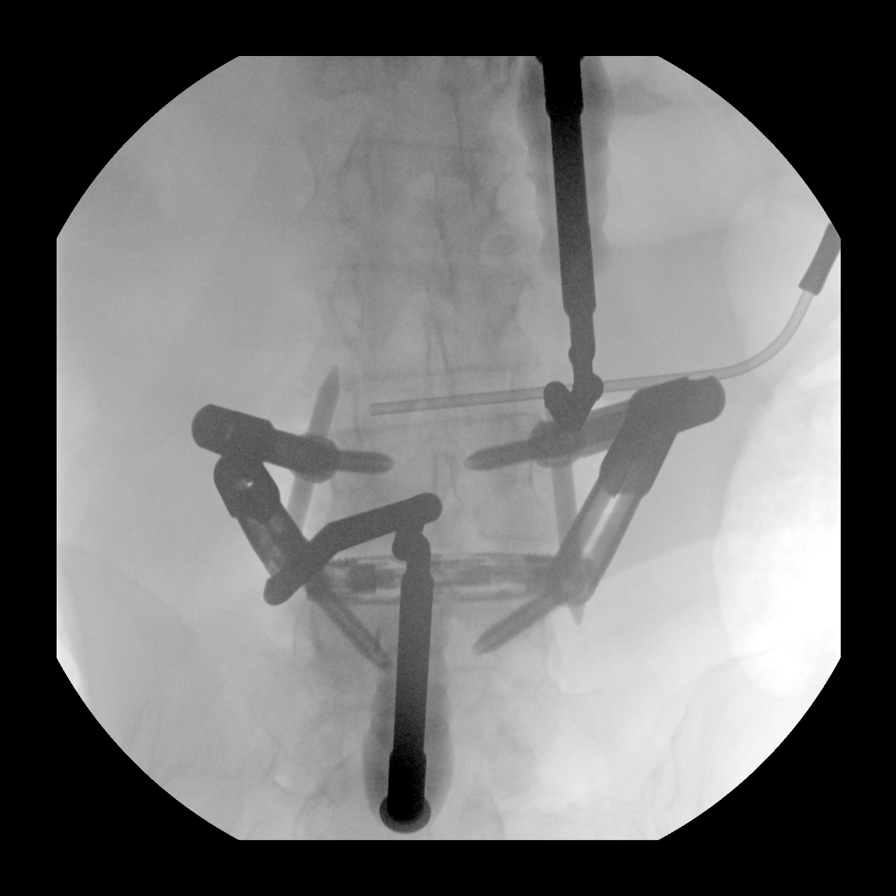

[7 of 7 positions shown; findings below may reference images not displayed]

FINDINGS: Seven C-arm fluoroscopic images were obtained intraoperatively and
submitted for post operative interpretation. Partially lumbarized S1
(as detailed on prior MRI). Provided images demonstrate placement of
bilateral percutaneous pedicle screws at L4 and L5 with intervening
L4-L5 spacer. Please see the performing provider's procedural report
for further detail.
IMPRESSION: Intraoperative fluoroscopy, as detailed above.

## 2021-01-29 IMAGING — RF DG C-ARM 1-60 MIN
1 series · 7 of 7 positions shown · non-contrast
Comparison: MRI lumbar spine [DATE].

CLINICAL DATA: Surgery

EXAM:
DG C-ARM 1-60 MIN; LUMBAR SPINE - 2-3 VIEW
FLUOROSCOPY TIME:  Fluoroscopy Time:  50 seconds
Number of Acquired Spot Images: 7

[Series 1: dg x-ray · 0.20mm/px · 7 of 7 slices shown]
[im 1/7]
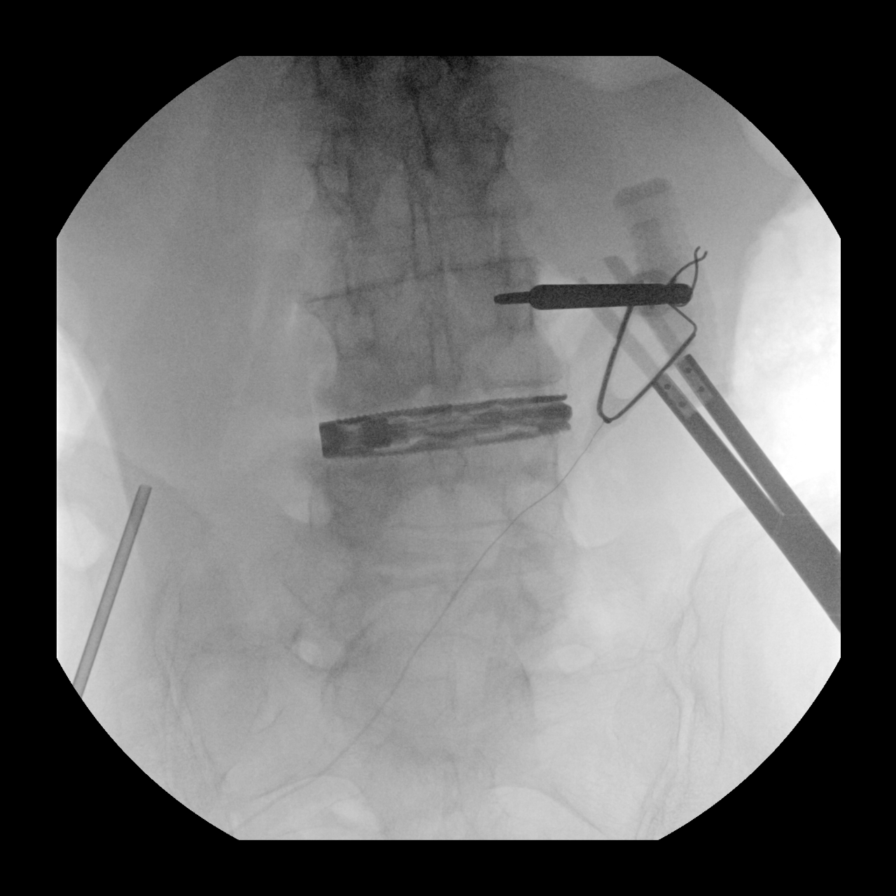
[im 2/7]
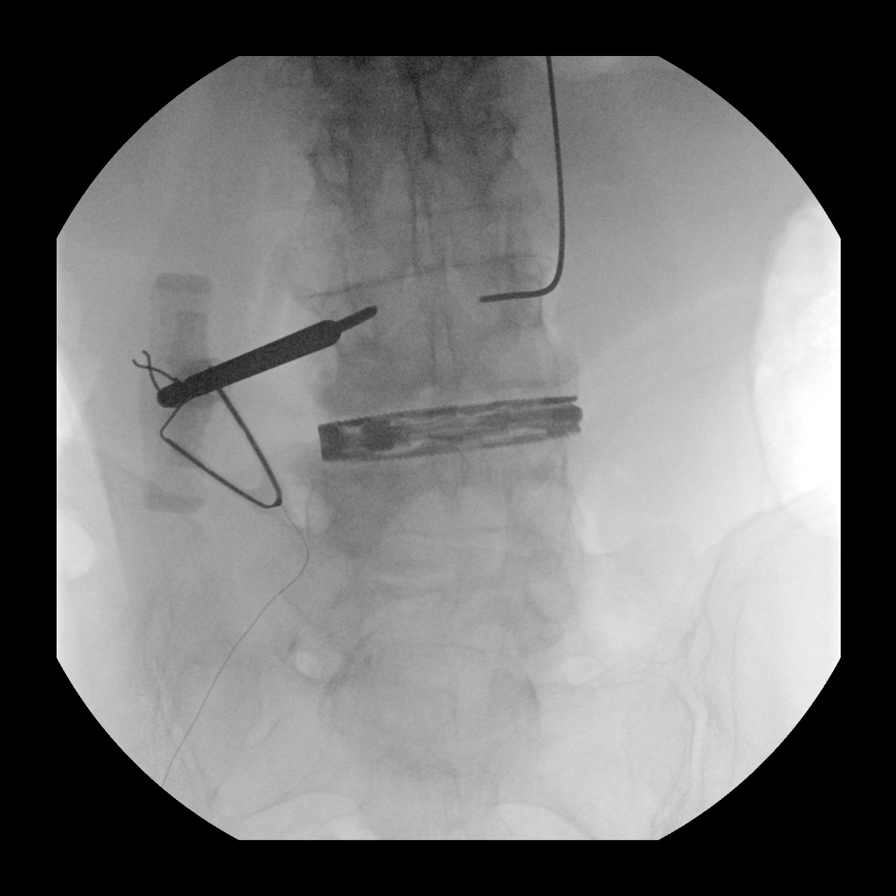
[im 3/7]
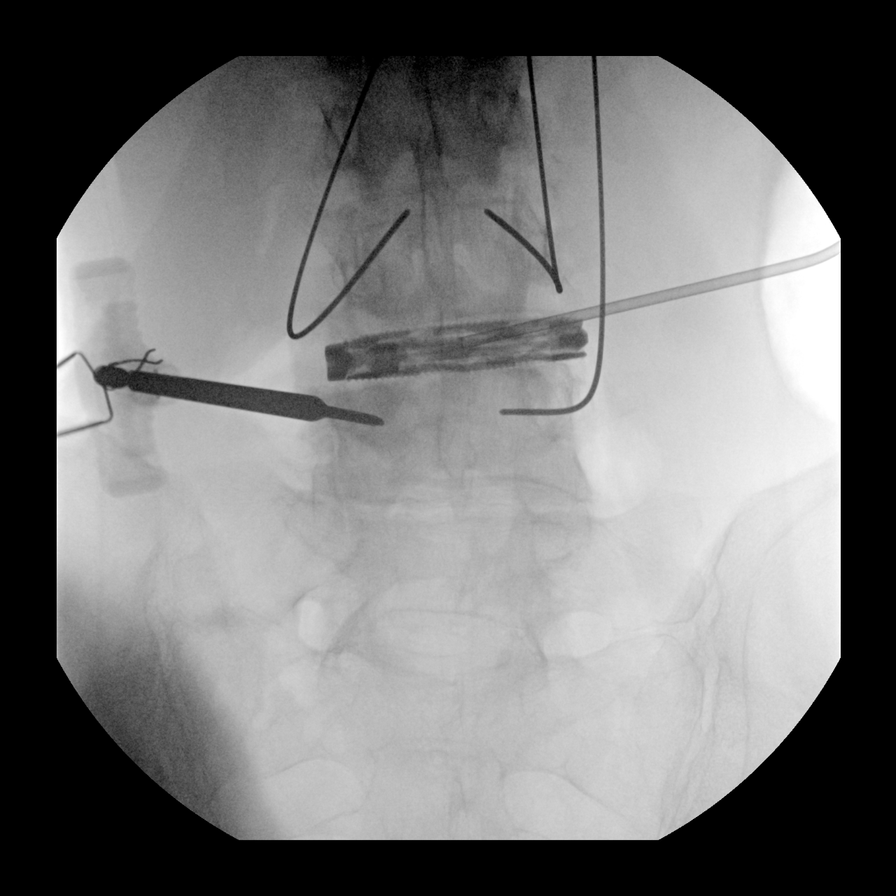
[im 4/7]
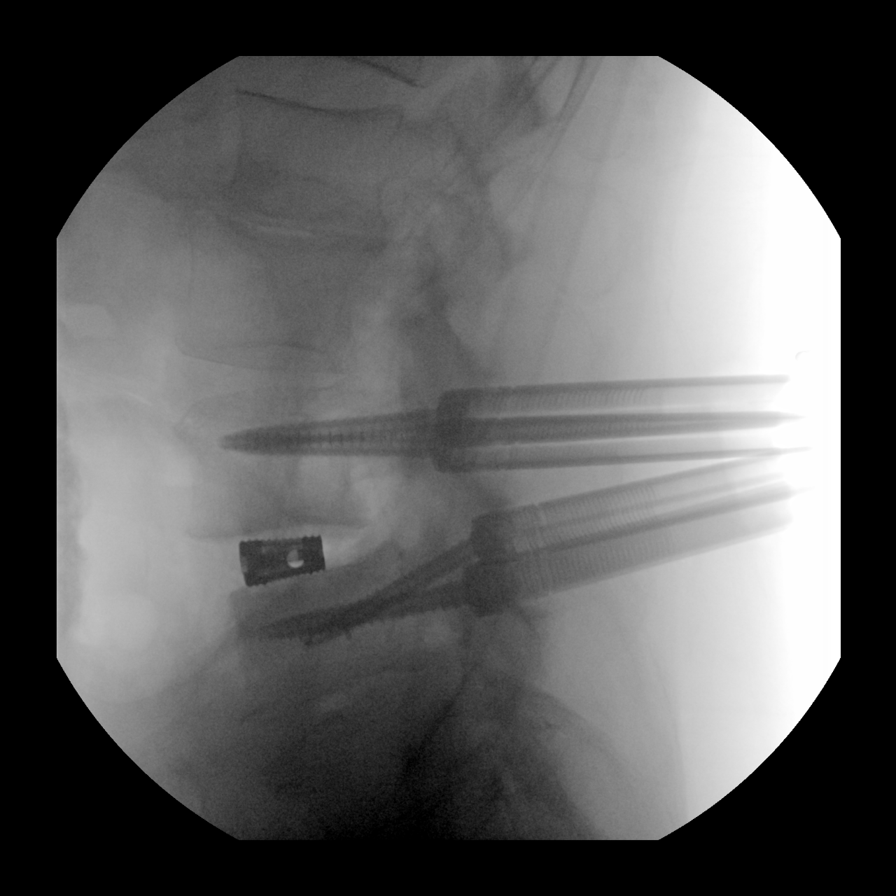
[im 5/7]
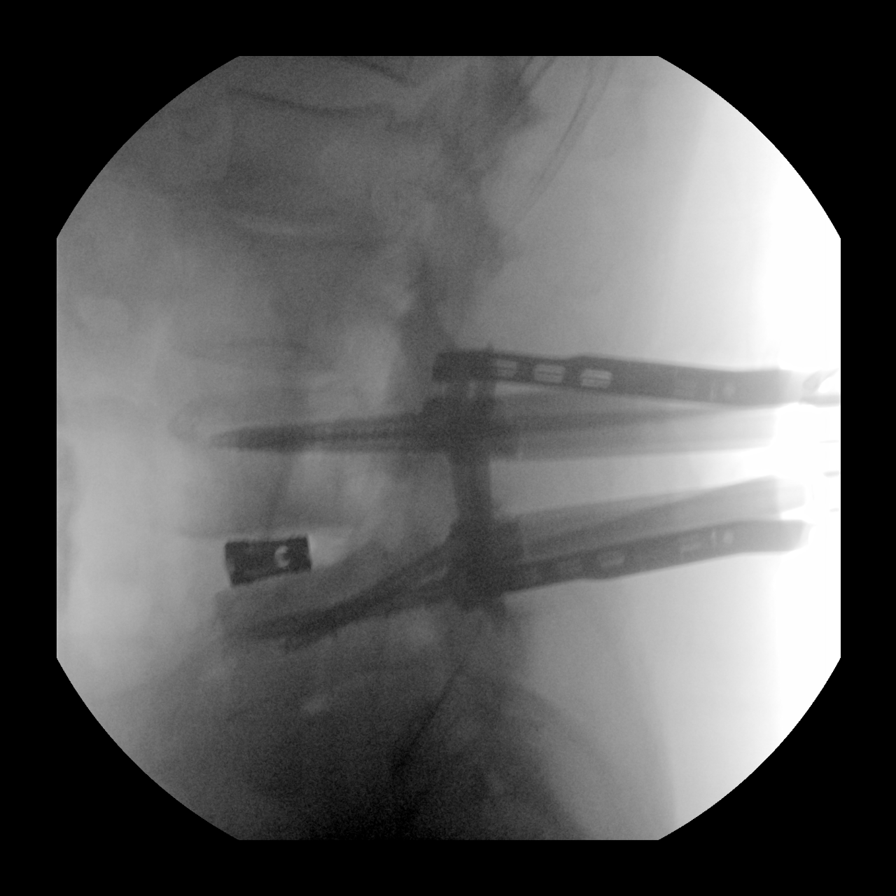
[im 6/7]
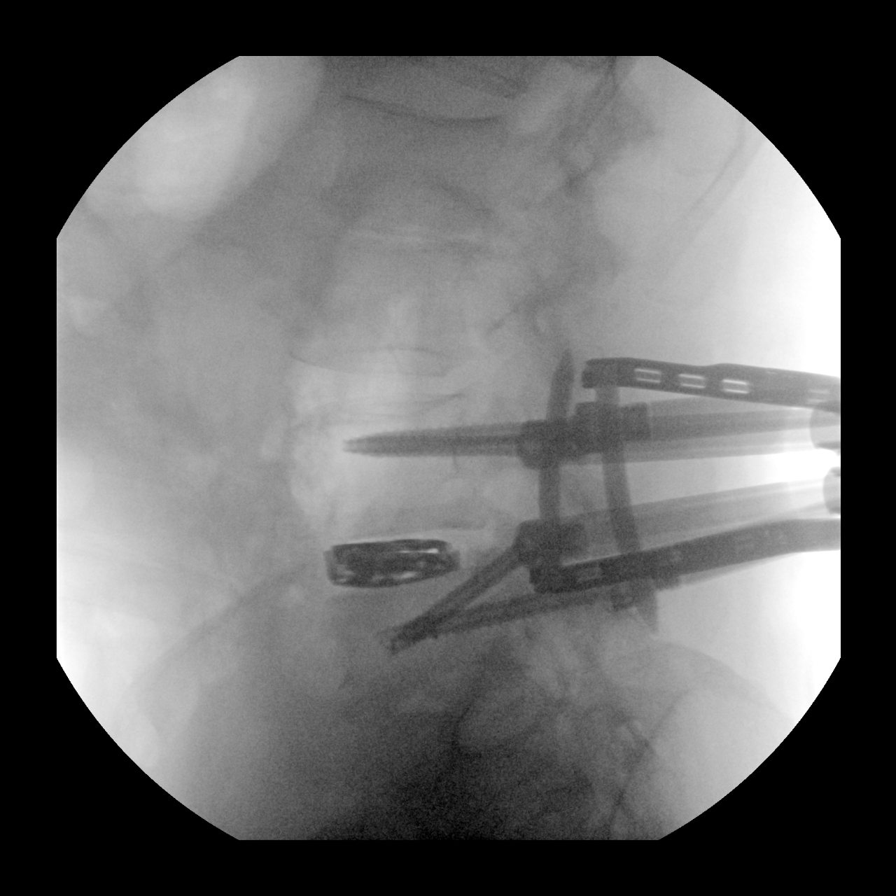
[im 7/7]
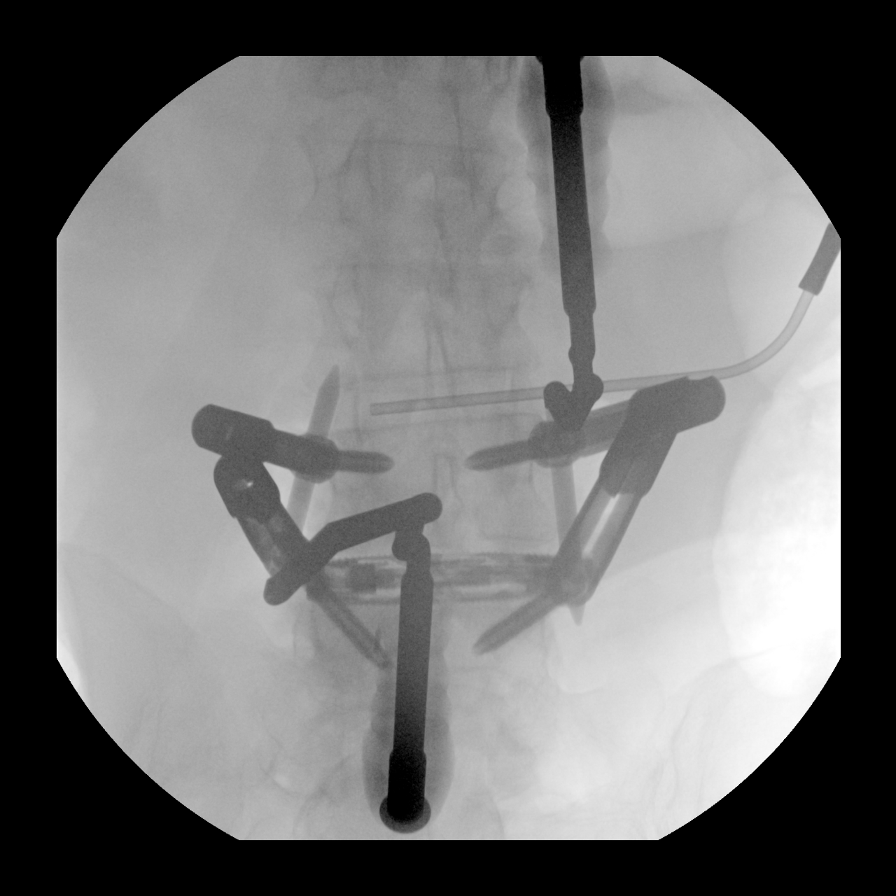

[7 of 7 positions shown; findings below may reference images not displayed]

FINDINGS: Seven C-arm fluoroscopic images were obtained intraoperatively and
submitted for post operative interpretation. Partially lumbarized S1
(as detailed on prior MRI). Provided images demonstrate placement of
bilateral percutaneous pedicle screws at L4 and L5 with intervening
L4-L5 spacer. Please see the performing provider's procedural report
for further detail.
IMPRESSION: Intraoperative fluoroscopy, as detailed above.

## 2021-01-29 IMAGING — RF DG LUMBAR SPINE 2-3V
1 series · 7 of 7 positions shown · non-contrast
Comparison: MRI lumbar spine [DATE].

CLINICAL DATA: Surgery

EXAM:
DG C-ARM 1-60 MIN; LUMBAR SPINE - 2-3 VIEW
FLUOROSCOPY TIME:  Fluoroscopy Time:  50 seconds
Number of Acquired Spot Images: 7

[Series 1: dg x-ray · 0.20mm/px · 7 of 7 slices shown]
[im 1/7]
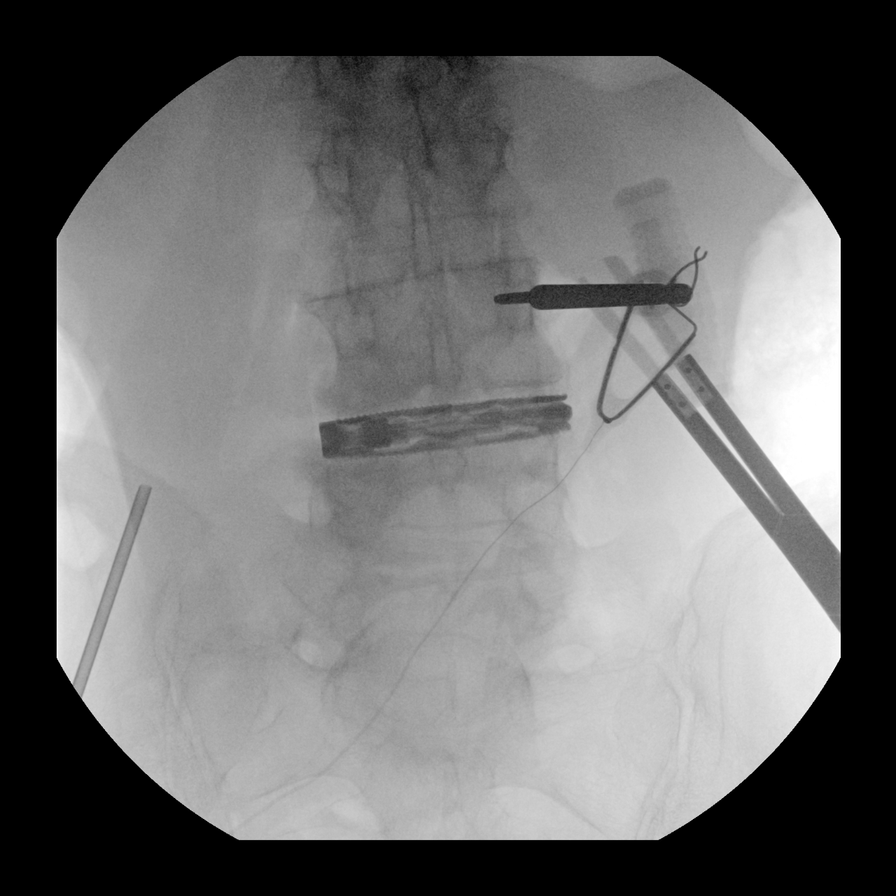
[im 2/7]
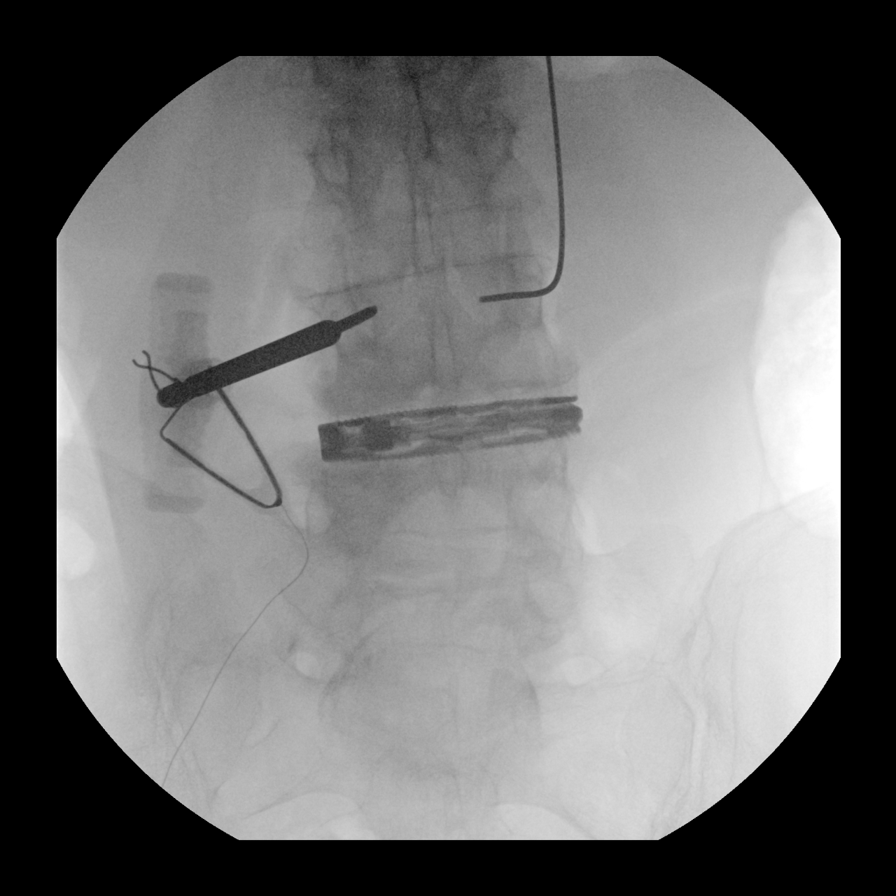
[im 3/7]
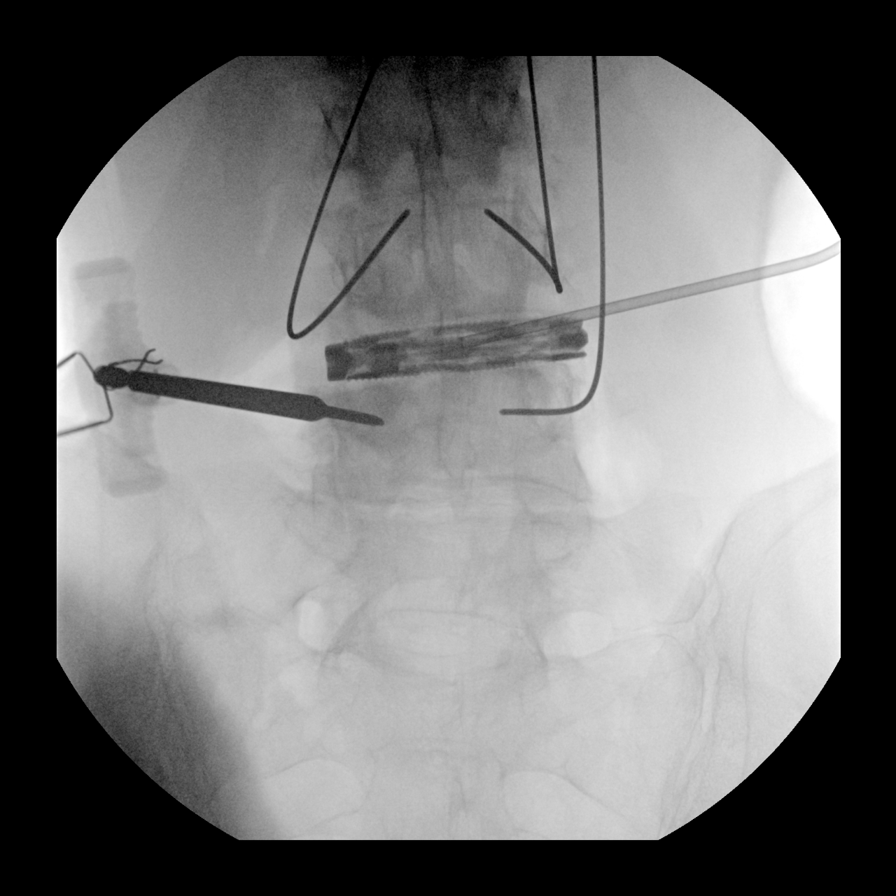
[im 4/7]
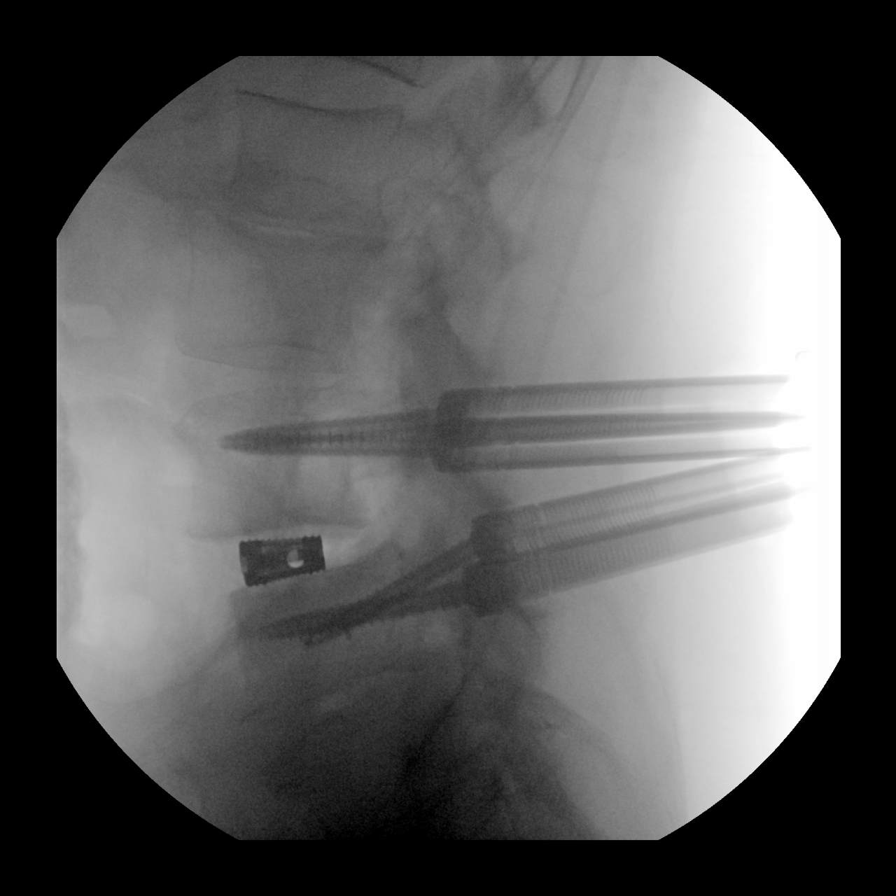
[im 5/7]
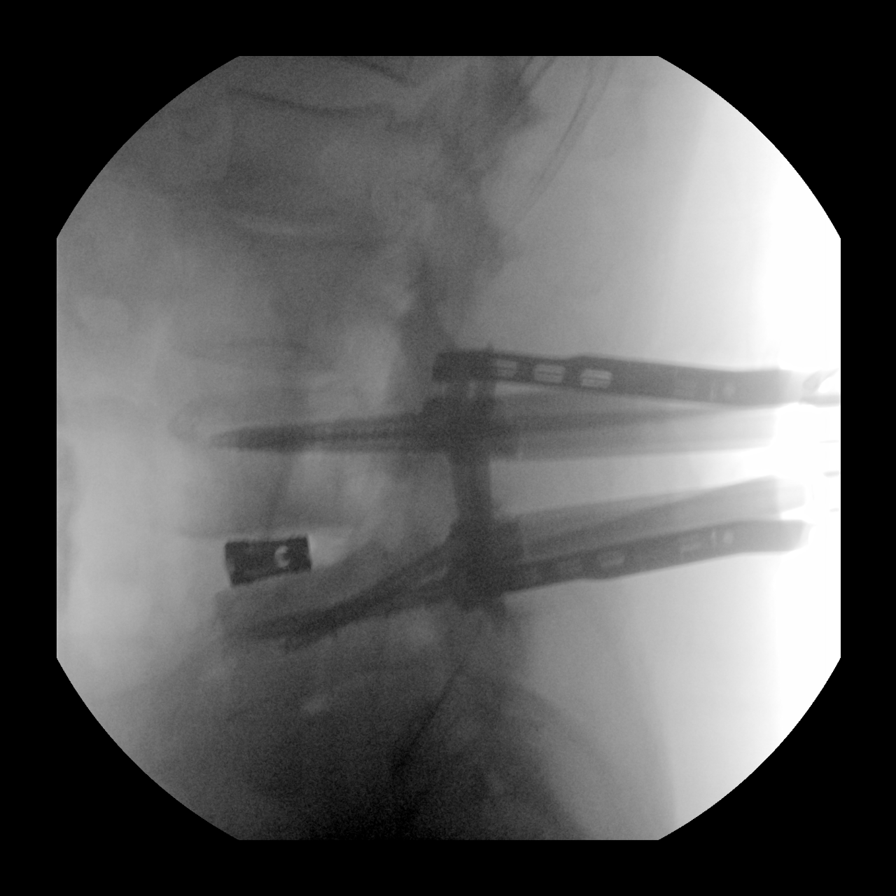
[im 6/7]
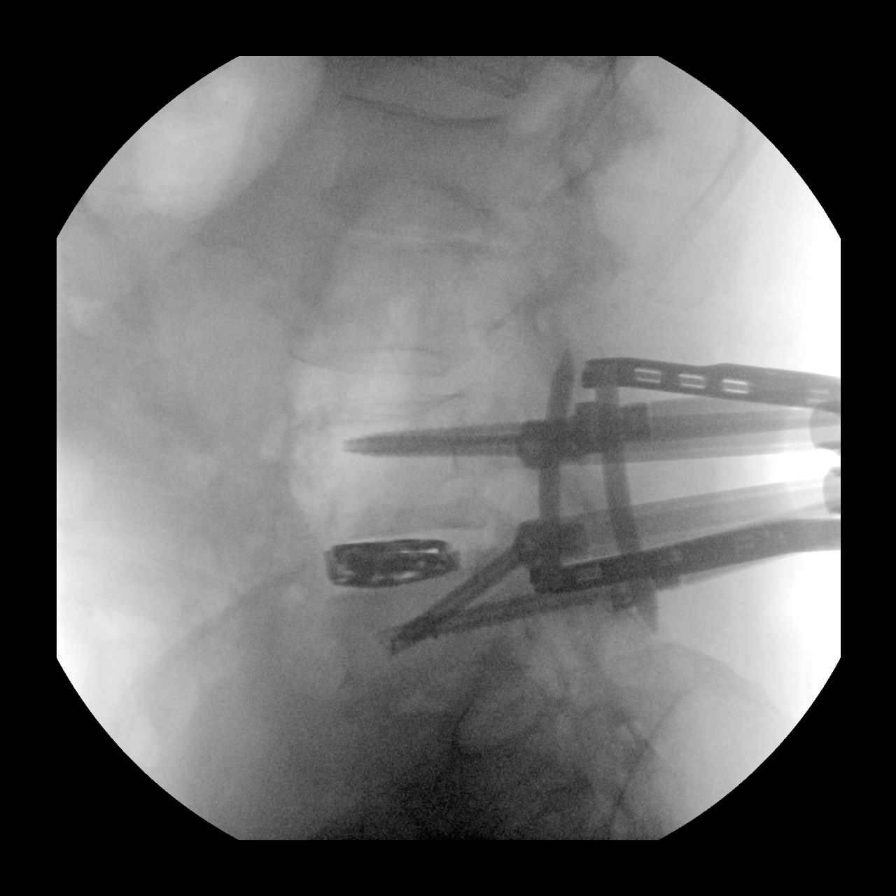
[im 7/7]
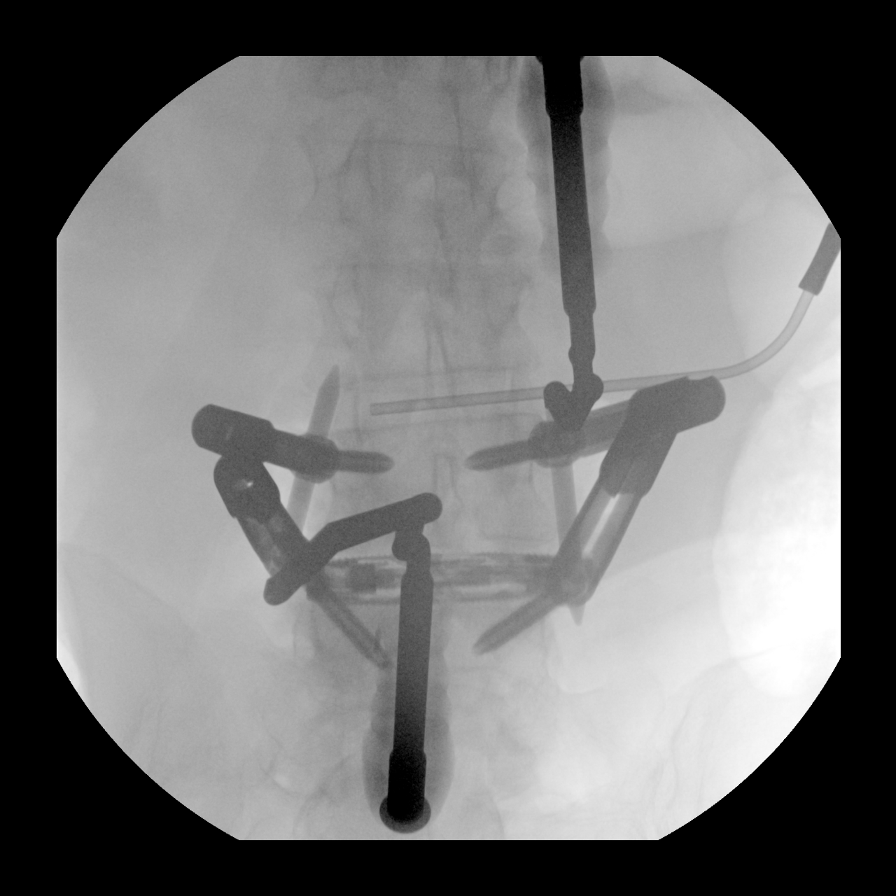

[7 of 7 positions shown; findings below may reference images not displayed]

FINDINGS: Seven C-arm fluoroscopic images were obtained intraoperatively and
submitted for post operative interpretation. Partially lumbarized S1
(as detailed on prior MRI). Provided images demonstrate placement of
bilateral percutaneous pedicle screws at L4 and L5 with intervening
L4-L5 spacer. Please see the performing provider's procedural report
for further detail.
IMPRESSION: Intraoperative fluoroscopy, as detailed above.

## 2021-01-29 IMAGING — RF DG LUMBAR SPINE 2-3V
1 series · 2 of 2 positions shown · non-contrast
Comparison: None.

CLINICAL DATA: Intraoperative radiographs for interbody fusion.

EXAM:
LUMBAR SPINE - 2-3 VIEW; DG C-ARM 1-60 MIN

[Series 1: dg x-ray · 0.20mm/px · 2 of 2 slices shown]
[im 1/2]
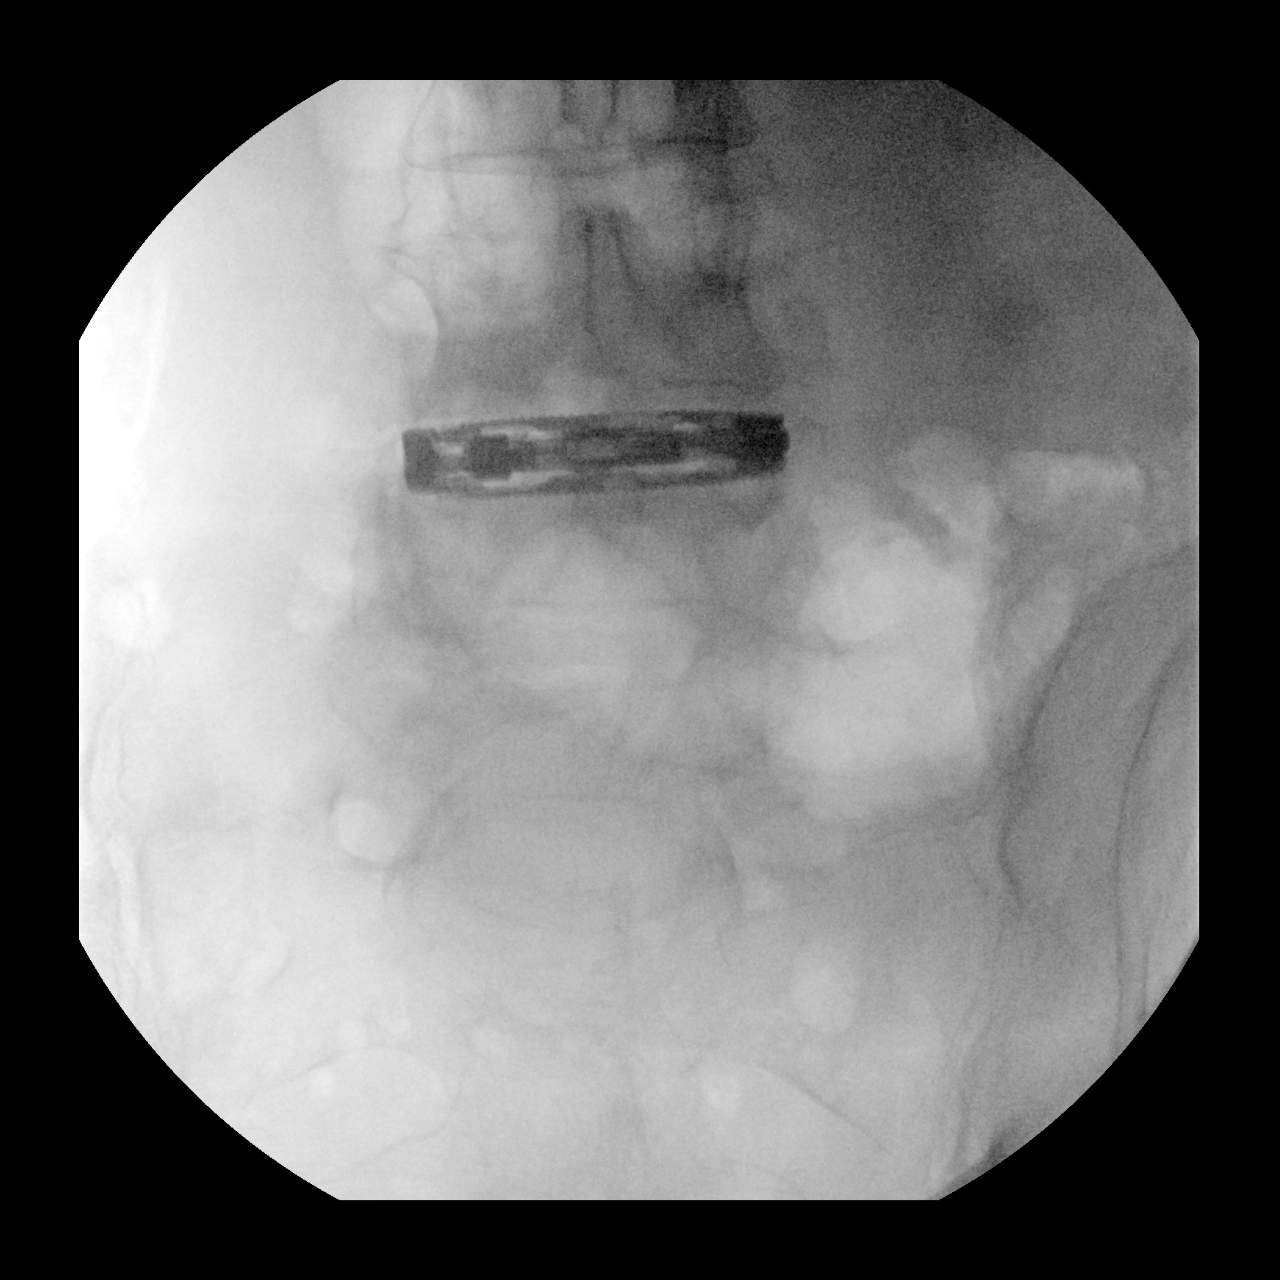
[im 2/2]
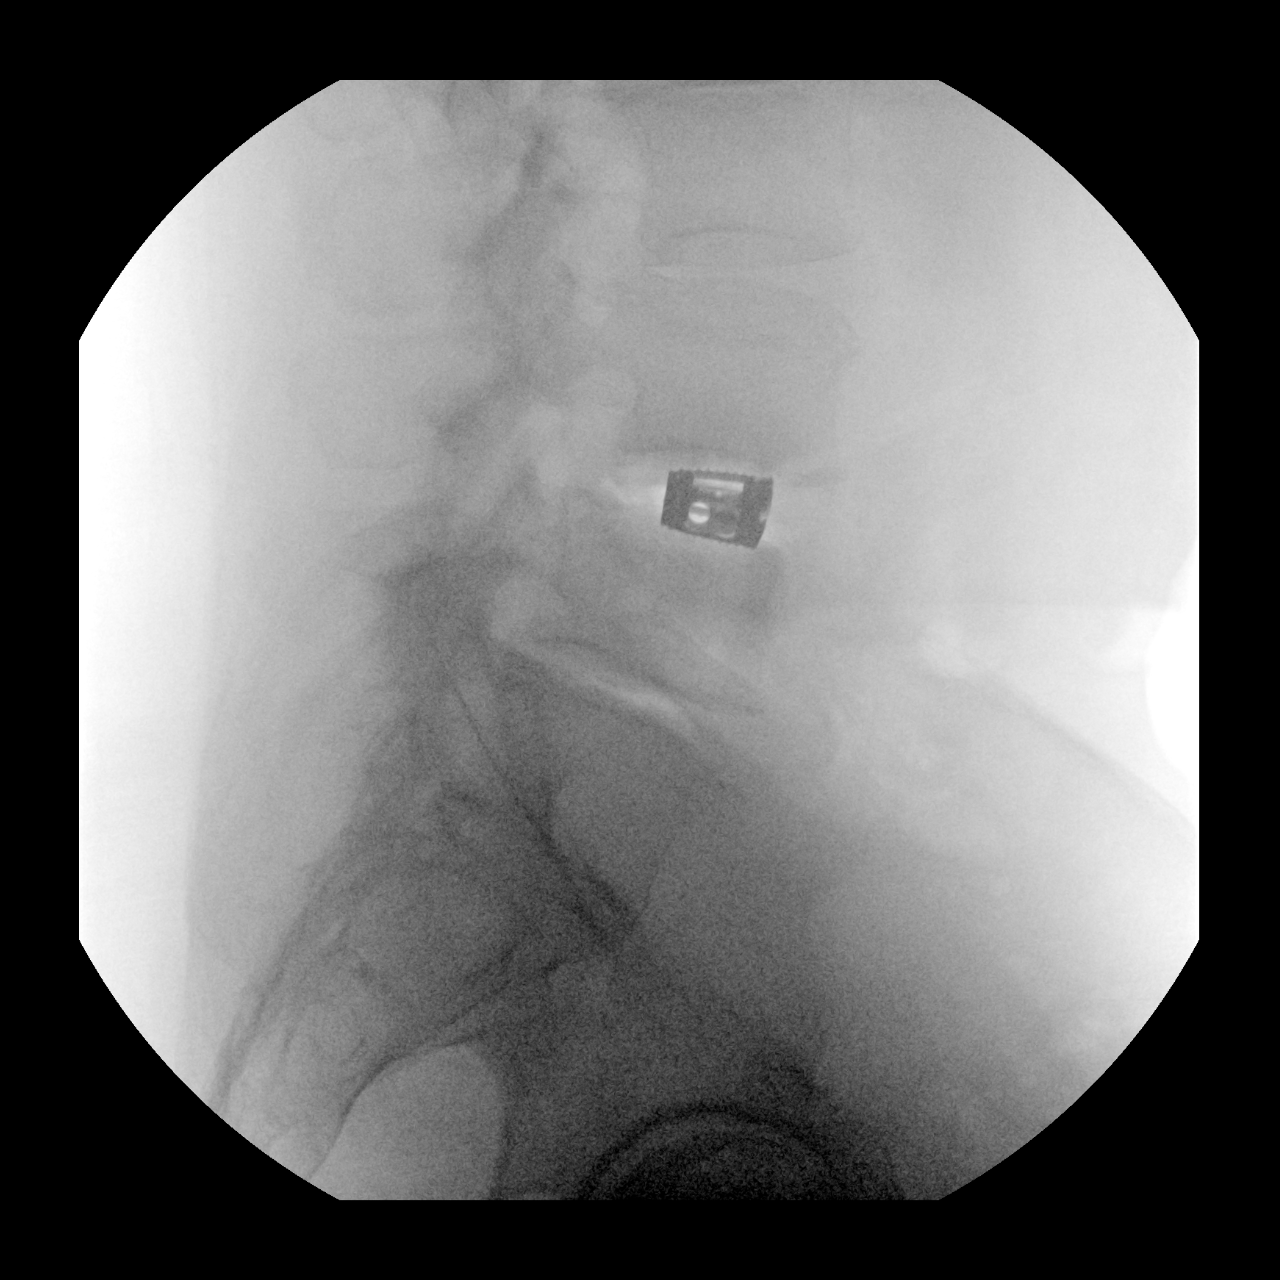

[2 of 2 positions shown; findings below may reference images not displayed]

FINDINGS: Two intraoperative views demonstrate interbody fusion device at
L4-5. Anterolisthesis is stable. No other foreign body is present.
IMPRESSION: Status post L4-5 fusion without radiographic evidence for
complication.

## 2021-01-29 IMAGING — RF DG C-ARM 1-60 MIN
1 series · 2 of 2 positions shown · non-contrast
Comparison: None.

CLINICAL DATA: Intraoperative radiographs for interbody fusion.

EXAM:
LUMBAR SPINE - 2-3 VIEW; DG C-ARM 1-60 MIN

[Series 1: dg x-ray · 0.20mm/px · 2 of 2 slices shown]
[im 1/2]
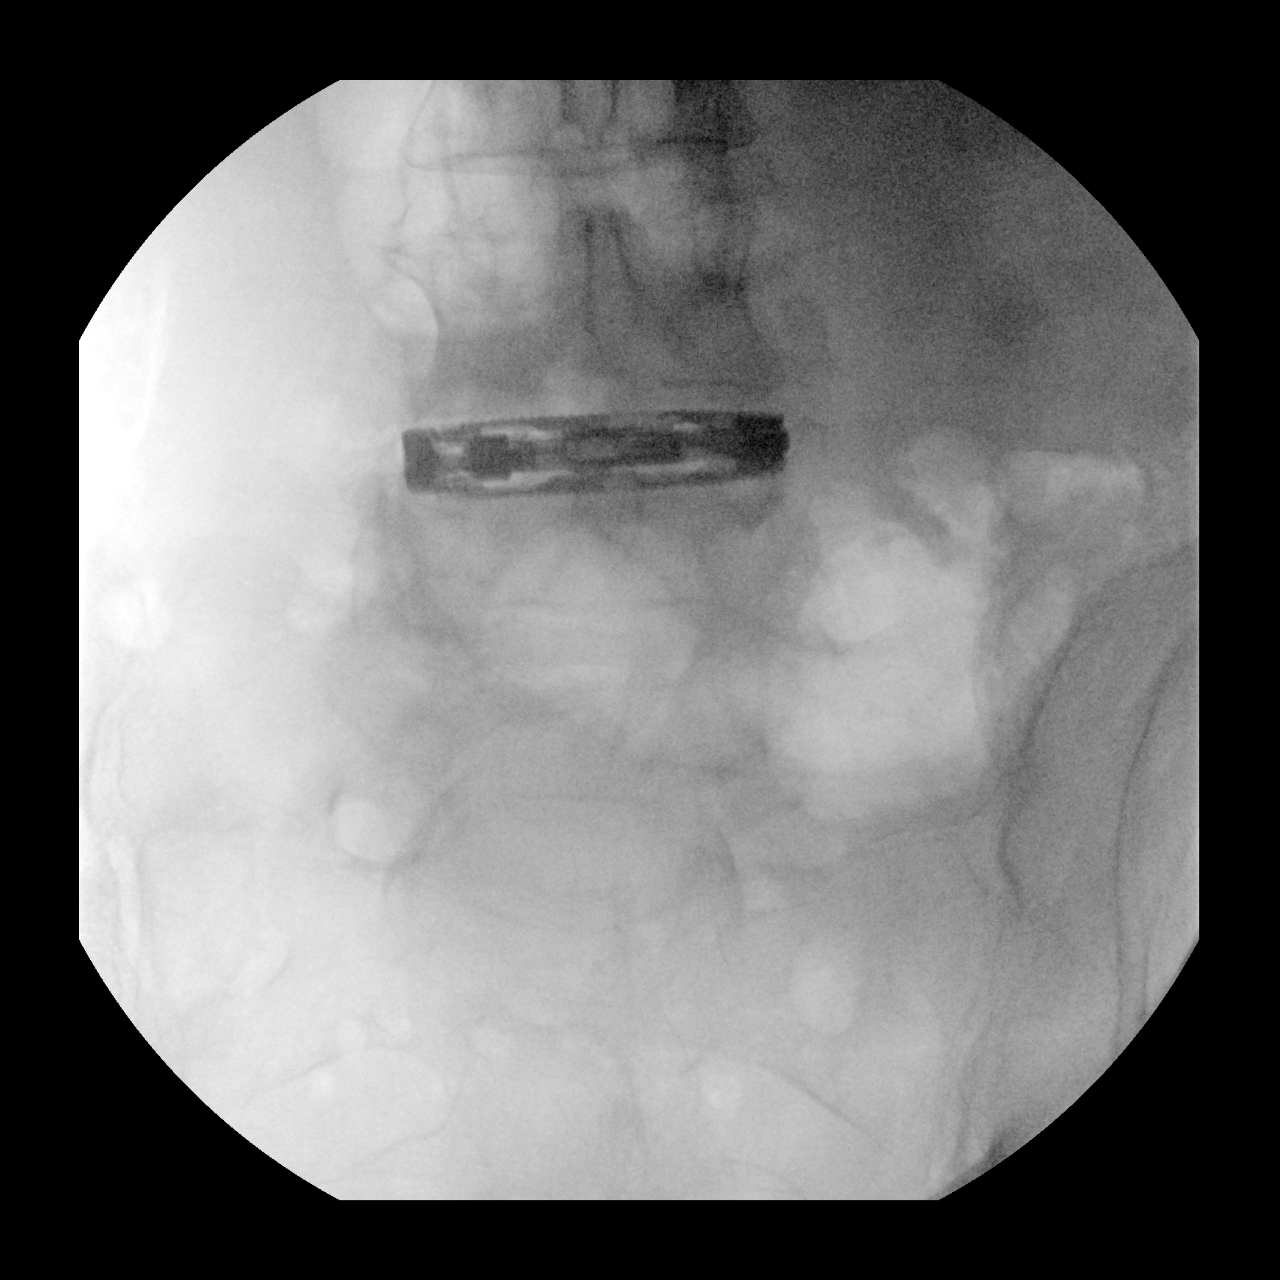
[im 2/2]
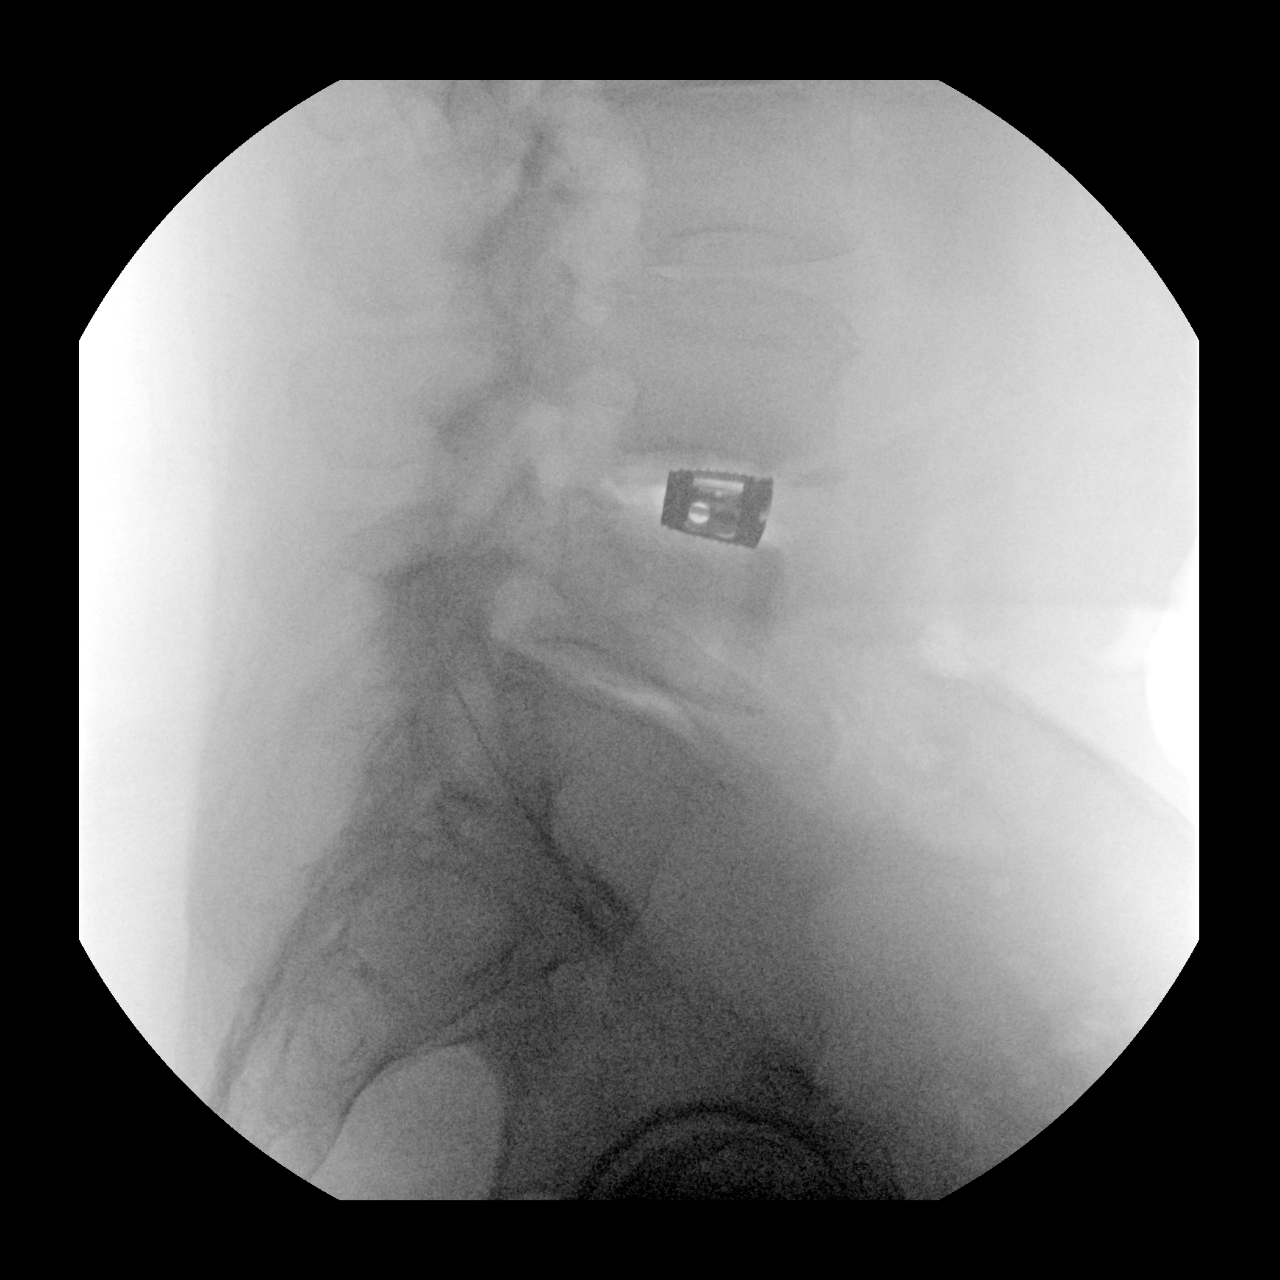

[2 of 2 positions shown; findings below may reference images not displayed]

FINDINGS: Two intraoperative views demonstrate interbody fusion device at
L4-5. Anterolisthesis is stable. No other foreign body is present.
IMPRESSION: Status post L4-5 fusion without radiographic evidence for
complication.

## 2021-01-29 SURGERY — ANTERIOR LATERAL LUMBAR FUSION WITH PERCUTANEOUS SCREW 1 LEVEL
Anesthesia: General

## 2021-01-29 MED ORDER — SODIUM CHLORIDE 0.9 % IV SOLN
250.0000 mL | INTRAVENOUS | Status: DC
  Administered 2021-01-29: 250 mL via INTRAVENOUS

## 2021-01-29 MED ORDER — FENTANYL CITRATE (PF) 100 MCG/2ML IJ SOLN
INTRAMUSCULAR | Status: AC
  Filled 2021-01-29: qty 2

## 2021-01-29 MED ORDER — MORPHINE SULFATE (PF) 4 MG/ML IV SOLN
INTRAVENOUS | Status: AC
  Filled 2021-01-29: qty 1

## 2021-01-29 MED ORDER — CEFAZOLIN SODIUM-DEXTROSE 2-4 GM/100ML-% IV SOLN
INTRAVENOUS | Status: AC
  Filled 2021-01-29: qty 100

## 2021-01-29 MED ORDER — KETOROLAC TROMETHAMINE 15 MG/ML IJ SOLN
7.5000 mg | Freq: Four times a day (QID) | INTRAMUSCULAR | Status: DC
  Administered 2021-01-29 – 2021-01-30 (×2): 7.5 mg via INTRAVENOUS
  Filled 2021-01-29 (×2): qty 1

## 2021-01-29 MED ORDER — ACETAMINOPHEN 500 MG PO TABS
ORAL_TABLET | ORAL | Status: AC
  Filled 2021-01-29: qty 2

## 2021-01-29 MED ORDER — HYDROMORPHONE HCL 1 MG/ML IJ SOLN
INTRAMUSCULAR | Status: AC
  Filled 2021-01-29: qty 1

## 2021-01-29 MED ORDER — FENTANYL CITRATE (PF) 100 MCG/2ML IJ SOLN
INTRAMUSCULAR | Status: DC | PRN
  Administered 2021-01-29: 50 ug via INTRAVENOUS

## 2021-01-29 MED ORDER — LACTATED RINGERS IV SOLN: INTRAVENOUS | Status: DC

## 2021-01-29 MED ORDER — SUCCINYLCHOLINE CHLORIDE 20 MG/ML IJ SOLN
INTRAMUSCULAR | Status: DC | PRN
  Administered 2021-01-29: 100 mg via INTRAVENOUS

## 2021-01-29 MED ORDER — ENOXAPARIN SODIUM 40 MG/0.4ML ~~LOC~~ SOLN
40.0000 mg | SUBCUTANEOUS | Status: DC
  Administered 2021-01-30: 40 mg via SUBCUTANEOUS
  Filled 2021-01-29: qty 0.4

## 2021-01-29 MED ORDER — ONDANSETRON HCL 4 MG/2ML IJ SOLN: 4.0000 mg | Freq: Four times a day (QID) | INTRAMUSCULAR | Status: DC | PRN

## 2021-01-29 MED ORDER — ACETAMINOPHEN 500 MG PO TABS
1000.0000 mg | ORAL_TABLET | Freq: Four times a day (QID) | ORAL | Status: AC
  Administered 2021-01-29 – 2021-01-30 (×4): 1000 mg via ORAL
  Filled 2021-01-29 (×4): qty 2

## 2021-01-29 MED ORDER — METHOCARBAMOL 500 MG PO TABS: 500.0000 mg | ORAL_TABLET | Freq: Four times a day (QID) | ORAL | Status: DC | PRN

## 2021-01-29 MED ORDER — HYDROMORPHONE HCL 1 MG/ML IJ SOLN: 0.5000 mg | INTRAMUSCULAR | Status: DC | PRN

## 2021-01-29 MED ORDER — ORAL CARE MOUTH RINSE
15.0000 mL | Freq: Once | OROMUCOSAL | Status: AC
  Administered 2021-01-29: 15 mL via OROMUCOSAL

## 2021-01-29 MED ORDER — MORPHINE SULFATE (PF) 4 MG/ML IV SOLN
2.0000 mg | INTRAVENOUS | Status: AC | PRN
  Administered 2021-01-29 (×5): 2 mg via INTRAVENOUS

## 2021-01-29 MED ORDER — SODIUM CHLORIDE 0.9% FLUSH: 3.0000 mL | INTRAVENOUS | Status: DC | PRN

## 2021-01-29 MED ORDER — EPHEDRINE SULFATE 50 MG/ML IJ SOLN
INTRAMUSCULAR | Status: DC | PRN
  Administered 2021-01-29 (×2): 5 mg via INTRAVENOUS

## 2021-01-29 MED ORDER — PROPOFOL 500 MG/50ML IV EMUL
INTRAVENOUS | Status: AC
  Filled 2021-01-29: qty 50

## 2021-01-29 MED ORDER — PHENOL 1.4 % MT LIQD
1.0000 | OROMUCOSAL | Status: DC | PRN
  Filled 2021-01-29: qty 177

## 2021-01-29 MED ORDER — ONDANSETRON HCL 4 MG/2ML IJ SOLN
INTRAMUSCULAR | Status: AC
  Filled 2021-01-29: qty 2

## 2021-01-29 MED ORDER — DEXAMETHASONE SODIUM PHOSPHATE 10 MG/ML IJ SOLN
INTRAMUSCULAR | Status: DC | PRN
  Administered 2021-01-29: 10 mg via INTRAVENOUS

## 2021-01-29 MED ORDER — MENTHOL 3 MG MT LOZG
1.0000 | LOZENGE | OROMUCOSAL | Status: DC | PRN
  Filled 2021-01-29: qty 9

## 2021-01-29 MED ORDER — ONDANSETRON HCL 4 MG/2ML IJ SOLN: 4.0000 mg | Freq: Once | INTRAMUSCULAR | Status: DC | PRN

## 2021-01-29 MED ORDER — GABAPENTIN 300 MG PO CAPS
ORAL_CAPSULE | ORAL | Status: AC
  Filled 2021-01-29: qty 1

## 2021-01-29 MED ORDER — FAMOTIDINE 20 MG PO TABS
ORAL_TABLET | ORAL | Status: AC
  Filled 2021-01-29: qty 1

## 2021-01-29 MED ORDER — HYDROMORPHONE HCL 1 MG/ML IJ SOLN
0.5000 mg | INTRAMUSCULAR | Status: AC | PRN
Start: 2021-01-29 — End: 2021-01-29
  Administered 2021-01-29 (×4): 0.5 mg via INTRAVENOUS

## 2021-01-29 MED ORDER — CELECOXIB 200 MG PO CAPS
ORAL_CAPSULE | ORAL | Status: AC
  Filled 2021-01-29: qty 1

## 2021-01-29 MED ORDER — CHLORHEXIDINE GLUCONATE 0.12 % MT SOLN
OROMUCOSAL | Status: AC
  Filled 2021-01-29: qty 15

## 2021-01-29 MED ORDER — PROPOFOL 500 MG/50ML IV EMUL
INTRAVENOUS | Status: DC | PRN
  Administered 2021-01-29: 150 ug/kg/min via INTRAVENOUS

## 2021-01-29 MED ORDER — CHLORHEXIDINE GLUCONATE 0.12 % MT SOLN: 15.0000 mL | Freq: Once | OROMUCOSAL | Status: AC

## 2021-01-29 MED ORDER — REMIFENTANIL HCL 1 MG IV SOLR
INTRAVENOUS | Status: AC
  Filled 2021-01-29: qty 1000

## 2021-01-29 MED ORDER — ACETAMINOPHEN 10 MG/ML IV SOLN
INTRAVENOUS | Status: DC | PRN
  Administered 2021-01-29: 1000 mg via INTRAVENOUS

## 2021-01-29 MED ORDER — FAMOTIDINE 20 MG PO TABS
20.0000 mg | ORAL_TABLET | Freq: Once | ORAL | Status: AC
  Administered 2021-01-29: 20 mg via ORAL

## 2021-01-29 MED ORDER — ACETAMINOPHEN 10 MG/ML IV SOLN
INTRAVENOUS | Status: AC
  Filled 2021-01-29: qty 100

## 2021-01-29 MED ORDER — ONDANSETRON HCL 4 MG/2ML IJ SOLN
INTRAMUSCULAR | Status: DC | PRN
  Administered 2021-01-29: 4 mg via INTRAVENOUS

## 2021-01-29 MED ORDER — OXYCODONE HCL 5 MG PO TABS: 10.0000 mg | ORAL_TABLET | ORAL | Status: DC | PRN

## 2021-01-29 MED ORDER — SODIUM CHLORIDE 0.9 % IV SOLN
INTRAVENOUS | Status: DC | PRN
  Administered 2021-01-29: 40 mL

## 2021-01-29 MED ORDER — THROMBIN 5000 UNITS EX SOLR
CUTANEOUS | Status: DC | PRN
  Administered 2021-01-29: 5000 [IU] via TOPICAL

## 2021-01-29 MED ORDER — CEFAZOLIN SODIUM-DEXTROSE 2-4 GM/100ML-% IV SOLN
2.0000 g | INTRAVENOUS | Status: AC
  Administered 2021-01-29: 2 g via INTRAVENOUS

## 2021-01-29 MED ORDER — POTASSIUM CHLORIDE IN NACL 20-0.9 MEQ/L-% IV SOLN
INTRAVENOUS | Status: DC
  Filled 2021-01-29 (×5): qty 1000

## 2021-01-29 MED ORDER — LIDOCAINE HCL (CARDIAC) PF 100 MG/5ML IV SOSY
PREFILLED_SYRINGE | INTRAVENOUS | Status: DC | PRN
  Administered 2021-01-29: 60 mg via INTRAVENOUS
  Administered 2021-01-29: 80 mg via INTRAVENOUS

## 2021-01-29 MED ORDER — ZINC GLUCONATE 50 MG PO TABS: 50.0000 mg | ORAL_TABLET | Freq: Every day | ORAL | Status: DC

## 2021-01-29 MED ORDER — BUPIVACAINE-EPINEPHRINE 0.5% -1:200000 IJ SOLN
INTRAMUSCULAR | Status: DC | PRN
  Administered 2021-01-29: 5 mL
  Administered 2021-01-29: 6 mL

## 2021-01-29 MED ORDER — ONDANSETRON HCL 4 MG PO TABS: 4.0000 mg | ORAL_TABLET | Freq: Four times a day (QID) | ORAL | Status: DC | PRN

## 2021-01-29 MED ORDER — BUPIVACAINE HCL (PF) 0.5 % IJ SOLN
INTRAMUSCULAR | Status: DC | PRN
  Administered 2021-01-29: 20 mL

## 2021-01-29 MED ORDER — VANCOMYCIN HCL 1500 MG/300ML IV SOLN
1500.0000 mg | INTRAVENOUS | Status: AC
  Administered 2021-01-29 (×2): 1500 mg via INTRAVENOUS
  Filled 2021-01-29: qty 300

## 2021-01-29 MED ORDER — PHENYLEPHRINE HCL (PRESSORS) 10 MG/ML IV SOLN
INTRAVENOUS | Status: DC | PRN
  Administered 2021-01-29 (×2): 100 ug via INTRAVENOUS

## 2021-01-29 MED ORDER — OXYCODONE HCL 5 MG PO TABS: 5.0000 mg | ORAL_TABLET | ORAL | Status: DC | PRN

## 2021-01-29 MED ORDER — DEXMEDETOMIDINE (PRECEDEX) IN NS 20 MCG/5ML (4 MCG/ML) IV SYRINGE
PREFILLED_SYRINGE | INTRAVENOUS | Status: AC
  Filled 2021-01-29: qty 5

## 2021-01-29 MED ORDER — SODIUM CHLORIDE 0.9 % IV SOLN
INTRAVENOUS | Status: DC | PRN
  Administered 2021-01-29: 30 ug/min via INTRAVENOUS

## 2021-01-29 MED ORDER — SODIUM CHLORIDE 0.9% FLUSH
3.0000 mL | Freq: Two times a day (BID) | INTRAVENOUS | Status: DC
  Administered 2021-01-29 – 2021-01-30 (×2): 3 mL via INTRAVENOUS

## 2021-01-29 MED ORDER — PROPOFOL 10 MG/ML IV BOLUS
INTRAVENOUS | Status: DC | PRN
  Administered 2021-01-29: 30 mg via INTRAVENOUS
  Administered 2021-01-29: 120 mg via INTRAVENOUS

## 2021-01-29 MED ORDER — REMIFENTANIL HCL 1 MG IV SOLR
INTRAVENOUS | Status: DC | PRN
  Administered 2021-01-29: .15 ug/kg/min via INTRAVENOUS

## 2021-01-29 MED ORDER — SENNOSIDES-DOCUSATE SODIUM 8.6-50 MG PO TABS: 1.0000 | ORAL_TABLET | Freq: Every evening | ORAL | Status: DC | PRN

## 2021-01-29 MED ORDER — METHOCARBAMOL 1000 MG/10ML IJ SOLN
500.0000 mg | Freq: Four times a day (QID) | INTRAVENOUS | Status: DC | PRN
  Administered 2021-01-29: 500 mg via INTRAVENOUS
  Filled 2021-01-29: qty 5

## 2021-01-29 SURGICAL SUPPLY — 78 items
1.65KWIRE NITINOL SHARP ×8 IMPLANT
BUR NEURO DRILL SOFT 3.0X3.8M (BURR) ×2 IMPLANT
CANISTER SUCT 1200ML W/VALVE (MISCELLANEOUS) ×4 IMPLANT
CAP LOCKING SPINE (Cap) ×8 IMPLANT
CHLORAPREP W/TINT 26 (MISCELLANEOUS) ×8 IMPLANT
CORD BIP STRL DISP 12FT (MISCELLANEOUS) ×2 IMPLANT
CORD LIGHT LATERIAL X LIFT (MISCELLANEOUS) ×2 IMPLANT
COUNTER NEEDLE 20/40 LG (NEEDLE) ×2 IMPLANT
COVER BACK TABLE REUSABLE LG (DRAPES) ×2 IMPLANT
COVER WAND RF STERILE (DRAPES) ×2 IMPLANT
CUP MEDICINE 2OZ PLAST GRAD ST (MISCELLANEOUS) ×2 IMPLANT
DERMABOND ADVANCED (GAUZE/BANDAGES/DRESSINGS) ×3
DERMABOND ADVANCED .7 DNX12 (GAUZE/BANDAGES/DRESSINGS) ×3 IMPLANT
DRAPE C ARM PK CFD 31 SPINE (DRAPES) ×4 IMPLANT
DRAPE C-ARMOR (DRAPES) ×4 IMPLANT
DRAPE INCISE IOBAN 66X45 STRL (DRAPES) ×4 IMPLANT
DRAPE LAPAROTOMY 100X77 ABD (DRAPES) ×4 IMPLANT
DRAPE MICROSCOPE SPINE 48X150 (DRAPES) ×2 IMPLANT
DRAPE SURG 17X11 SM STRL (DRAPES) ×16 IMPLANT
DRSG OPSITE POSTOP 4X6 (GAUZE/BANDAGES/DRESSINGS) ×6 IMPLANT
DRSG TEGADERM 2-3/8X2-3/4 SM (GAUZE/BANDAGES/DRESSINGS) IMPLANT
DRSG TEGADERM 4X4.75 (GAUZE/BANDAGES/DRESSINGS) IMPLANT
DRSG TEGADERM 6X8 (GAUZE/BANDAGES/DRESSINGS) IMPLANT
DRSG TELFA 3X8 NADH (GAUZE/BANDAGES/DRESSINGS) IMPLANT
DRSG TELFA 4X3 1S NADH ST (GAUZE/BANDAGES/DRESSINGS) IMPLANT
ELECT CAUTERY BLADE TIP 2.5 (TIP) ×4
ELECT EZSTD 165MM 6.5IN (MISCELLANEOUS) ×2
ELECT REM PT RETURN 9FT ADLT (ELECTROSURGICAL) ×4
ELECTRODE CAUTERY BLDE TIP 2.5 (TIP) ×2 IMPLANT
ELECTRODE EZSTD 165MM 6.5IN (MISCELLANEOUS) ×1 IMPLANT
ELECTRODE REM PT RTRN 9FT ADLT (ELECTROSURGICAL) ×2 IMPLANT
FEE INTRAOP MONITOR IMPULS NCS (MISCELLANEOUS) ×1 IMPLANT
FORCEPS BPLR BAYO 10IN 1.0TIP (INSTRUMENTS) ×2 IMPLANT
GLOVE SURG SYN 7.0 (GLOVE) ×8 IMPLANT
GLOVE SURG SYN 8.5  E (GLOVE) ×6
GLOVE SURG SYN 8.5 E (GLOVE) ×6 IMPLANT
GLOVE SURG UNDER POLY LF SZ7 (GLOVE) ×4 IMPLANT
GOWN SRG XL LVL 3 NONREINFORCE (GOWNS) ×2 IMPLANT
GOWN STRL NON-REIN TWL XL LVL3 (GOWNS) ×2
GOWN STRL REUS W/ TWL XL LVL3 (GOWN DISPOSABLE) ×1 IMPLANT
GOWN STRL REUS W/TWL XL LVL3 (GOWN DISPOSABLE) ×1
GRADUATE 1200CC STRL 31836 (MISCELLANEOUS) ×2 IMPLANT
ILLUMINATION SYSTEM ×2 IMPLANT
INTRAOP MONITOR FEE IMPULS NCS (MISCELLANEOUS) ×1
INTRAOP MONITOR FEE IMPULSE (MISCELLANEOUS) ×1
KIT DISP MARS 3V (KITS) ×2 IMPLANT
KIT INFUSE MEDIUM (Orthopedic Implant) ×2 IMPLANT
KIT PEDICLE ACCESS (KITS) ×2 IMPLANT
KIT SPINAL PRONEVIEW (KITS) ×2 IMPLANT
KIT TURNOVER KIT A (KITS) ×2 IMPLANT
KNIFE BAYONET SHORT DISCETOMY (MISCELLANEOUS) IMPLANT
MANIFOLD NEPTUNE II (INSTRUMENTS) ×2 IMPLANT
MARKER SKIN DUAL TIP RULER LAB (MISCELLANEOUS) ×6 IMPLANT
NDL SAFETY ECLIPSE 18X1.5 (NEEDLE) ×1 IMPLANT
NEEDLE HYPO 18GX1.5 SHARP (NEEDLE) ×1
NEEDLE HYPO 22GX1.5 SAFETY (NEEDLE) ×2 IMPLANT
NEEDLE I PASS (NEEDLE) ×2 IMPLANT
PACK LAMINECTOMY NEURO (CUSTOM PROCEDURE TRAY) ×2 IMPLANT
PAD ARMBOARD 7.5X6 YLW CONV (MISCELLANEOUS) ×6 IMPLANT
PENCIL ELECTRO HAND CTR (MISCELLANEOUS) ×2 IMPLANT
ROD SPINAL CREO AMP 5.5X60 CAN (Rod) ×4 IMPLANT
SCREW CANN SPINE 6.5X50 (Screw) ×8 IMPLANT
SCREW CREO MIS 30 TULIP (Screw) ×8 IMPLANT
SPACER RISE-L 18X55 7-14 (Spacer) ×2 IMPLANT
SPOGE SURGIFLO 8M (HEMOSTASIS) ×1
SPONGE GAUZE 2X2 8PLY STRL LF (GAUZE/BANDAGES/DRESSINGS) IMPLANT
SPONGE SURGIFLO 8M (HEMOSTASIS) ×1 IMPLANT
STAPLER SKIN PROX 35W (STAPLE) IMPLANT
SUT DVC VLOC 3-0 CL 6 P-12 (SUTURE) ×6 IMPLANT
SUT ETHILON 3-0 FS-10 30 BLK (SUTURE)
SUT VIC AB 0 CT1 27 (SUTURE) ×1
SUT VIC AB 0 CT1 27XCR 8 STRN (SUTURE) ×1 IMPLANT
SUT VIC AB 2-0 CT1 18 (SUTURE) ×2 IMPLANT
SUTURE EHLN 3-0 FS-10 30 BLK (SUTURE) IMPLANT
SYR 30ML LL (SYRINGE) ×4 IMPLANT
TOWEL OR 17X26 4PK STRL BLUE (TOWEL DISPOSABLE) ×6 IMPLANT
TRAY FOLEY MTR SLVR 16FR STAT (SET/KITS/TRAYS/PACK) IMPLANT
TUBING CONNECTING 10 (TUBING) ×6 IMPLANT

## 2021-01-29 NOTE — Anesthesia Postprocedure Evaluation (Signed)
Anesthesia Post Note  Patient: Christopher Huang  Procedure(s) Performed: L4-5 LATERAL INTERBODY FUSION, L4-5 POSTERIOR FUSION (N/A )  Patient location during evaluation: PACU Anesthesia Type: General Level of consciousness: awake and alert Pain management: pain level controlled Vital Signs Assessment: post-procedure vital signs reviewed and stable Respiratory status: spontaneous breathing and respiratory function stable Cardiovascular status: blood pressure returned to baseline and stable Anesthetic complications: no   No complications documented.   Last Vitals:  Vitals:   01/29/21 1645 01/29/21 1700  BP: 124/70 123/75  Pulse: 66 60  Resp: 15 18  Temp:  (!) 36.1 C  SpO2: 100% 93%    Last Pain:  Vitals:   01/29/21 1159  TempSrc: Oral  PainSc: 0-No pain                 [name] K

## 2021-01-29 NOTE — Op Note (Signed)
Indications: Mr. Alessio is a 70 yo male who presented with anterolisthesis.  He failed conservative management and elected for surgical intervention.   Findings: partial correction of anterolisthesis.  Preoperative Diagnosis: anterolisthesis Postoperative Diagnosis: same   EBL: 50 ml IVF: 500 ml Drains: none Disposition: Extubated and Stable to PACU Complications: none  A foley catheter was placed.   Preoperative Note:   Risks of surgery discussed include: infection, bleeding, stroke, coma, death, paralysis, CSF leak, nerve/spinal cord injury, numbness, tingling, weakness, complex regional pain syndrome, recurrent stenosis and/or disc herniation, vascular injury, development of instability, neck/back pain, need for further surgery, persistent symptoms, development of deformity, and the risks of anesthesia. The patient understood these risks and agreed to proceed.  NAME OF ANTERIOR PROCEDURE:               1. Anterior lumbar interbody fusion via a left lateral retroperitoneal approach at L4/5 2. Placement of a Lordotic Globus Rise interbody cage, filled with BMP   NAME OF POSTERIOR PROCEDURE: 1. Posterior instrumentation using Globus Creo Instrumentation 2. Posterolateral fusion, L4-5      PROCEDURE:  Patient was brought to the operating room, intubated, turned to the lateral position.  All pressure points were checked and double-checked.  The patient was prepped and draped in the standard fashion. Prior to prepping, fluoroscopy was brought in and the patient was positioned with a large bump under the contralateral side between the iliac crest and rib cage, allowing the area between the iliac crest and the lateral aspect of the rib cage to open and increase the ability to reach inferiorly, to facilitate entry into the disc space.  The incision was marked upon the skin both the location of the disc space as well as the superior most aspect of the iliac crest.  Based on the  identification of the disc space an incision was prepared, marked upon the skin and eventually was used for our lateral incision.  The fluoroscopy was turned into a cross table A/P image in order to confirm that the patient's spine remained in a perpendicular trajectory to the floor without rotation.  Once confirming that all the pressure points were checked and double-checked and the patient remained in sturdy position strapped down in this slightly jack-knifed lateral position, the patient was prepped and draped in standard fashion.  The skin was injected with local anesthetic, then incised until the abdominal wall fascia was noted.  I bluntly dissected posteriorly until we were able to identify the posterior musculature near petit's triangle.  At this point, using primarily blunt dissection with our finger aided with a metzenbaum scissor, were able to enter the retroperitoneal cavity.  The retroperitoneal potential space was opened further until palpating out the psoas muscle, the medial aspect of the iliac crest, the medial aspect of the last rib and continued to define the retroperitoneal space with blunt dissection in order to facilitate safe placement of our dilators.    While protecting by dissecting directly onto a finger in the retroperitoneum, the retroperitoneal space was entered safely from the lateral incision and the initial dilator placed onto the muscle belly of the psoas.  While directly stimulating the dilator and after radiographically confirming our location relative to the disc space, I placed the dilator through the psoas.  The dilators were stimulated to ensure remaining safely away from any of the lumbar plexus nerves; the dilators were repositioned until no pathologic stimulation was appreciated.  Once I had confirmed the location of our initial  dilator radiographically, a K-wire secured the dilator into the L4/5 disc space and confirmed position under A/P and lateral fluoroscopy.  At  this point, I dilated up with direct stimulation to confirm lack of pathologic stimulation.  Once all the dilators were in position, I placed in the retractor and secured it onto the table, locked into position and confirmed under A/P and lateral fluoroscopy to confirm our approach angle to the disc space as well as location relative to the disc space.  I then placed the muscle stimulator in through the working channel down to the vertebral body, stimulating the entire lateral surface of the vertebral body and any of the visualized psoas muscle that was adjacent to the retractor, confirming again the safe passage to the psoas before we began performing the discectomy.  At this point, we began our discectomy at L4/5.  The disc was incised laterally throughout the extent of our exposure. Using a combination of pituitary rongeurs, Kerrison rongeurs, rasps, curettes of various sorts, we were able to begin to clean out the disc space.  Once we had cleaned out the majority of the disc space, we then cut the lateral annulus with a cob, breaking the lateral annual attachments on the contralateral side by subtly working the cob through the annulus while using flouroscopy.  Care was taken not to extend further than required after cutting the annular attachments.  After this had been performed, we prepared the endplates for placement of our graft, sized a graft to the disc space by serially dilating up in trial sizes until we confirmed that our graft would be well positioned, allowing distraction while maintaining good grip.  This was confirmed under A/P and lateral fluoroscopy in order to ensure its placement as an eventual trial for placement of our final graft.  We irrigated with bacteriostatic saline.  Once confirmed placement, the Globus Rise implant filled with BMP was impacted into position at L4/5 and expanded.   Through a combination of intradiscal distraction and anterior releasing, we were able to correct the  anterior deformity during disc preparation and placement of the graft.          At this point, final radiographs were performed, and we began closure.  The wound was closed using 0 Vicryl interrupted suture in the fascia and 2-0 Vicryl inverted suture were placed in the subcutaneous tissue and dermis. 3-0 monocryl was used for final closure. Dermabond was used to close the skin.    After closing the anterior part in layers, the patient was repositioned into prone position.  All pressure points were checked and double-checked and we brought in fluoroscopy to confirm our approach angles for putting in percutaneous pedicle screws.  The pedicles were marked using true AP flouroscopy, adjusting the angle at each level.  We then prepped and draped the patient in the standard fashion.  At this point, incisions were made for placing percutaneous pedicle screw instrumentation at L4-5.  Starting at L4, a Jamsheedi needle was used to cannulate the pedicle bilaterally using AP flouroscopy. Direct stimulation was used on the needle without any low (<15 mAmp) stimulation thresholds. After cannulation of the pedicle to 30 mm, a K-wire was placed through the Bantry approximately and secured.   Using a similar technique, the pedicles at L4-L5 were cannulated and K wires secured. The K wires were then checked using lateral flouroscopy to ensure placement into the vertebral bodies. After confirming placement of K wires, cannulated pedicle screws were introduced over the K  wires at each level.  After advancing each screw into the vertebral body approximately 25-30 mm, the K wire was removed.At each level, 6.5x50 mm Globus Creo pedicle screws were placed under lateral flouroscopy. Once the screws were placed, the screw extensions were then linked, a path was formed for the rod and a rod was utilized to connect the screws.  We then compressed, torqued / counter-torqued and removed the screw assembly. Once performed on each  side, confirmatory AP and lateral x-rays were taken and the case was completed.   Again we confirmed radiographically and began our closure.  The wound was closed using 0 Vicryl interrupted suture in the fascia, 2-0 Vicryl inverted suture were placed in the subcutaneous tissue and dermis. 3-0 monocryl was used for final closure. Dermabond was used to close the skin.    Needle, lap and all counts were correct at the end of the case.    There was no pathologic change in the neuromonitoring during the procedure.   Liliane Bade PA assisted in the entire procedure.  Meade Maw MD Neurosurgery

## 2021-01-29 NOTE — Anesthesia Procedure Notes (Signed)
Procedure Name: Intubation Performed by: Posey Pronto Dalesha Stanback, CRNA Pre-anesthesia Checklist: Patient identified, Patient being monitored, Timeout performed, Emergency Drugs available and Suction available Patient Re-evaluated:Patient Re-evaluated prior to induction Oxygen Delivery Method: Circle system utilized Preoxygenation: Pre-oxygenation with 100% oxygen Induction Type: IV induction Ventilation: Mask ventilation without difficulty Laryngoscope Size: McGraph and 4 Grade View: Grade I Tube type: Oral Tube size: 7.5 mm Number of attempts: 1 Airway Equipment and Method: Stylet Placement Confirmation: ETT inserted through vocal cords under direct vision,  positive ETCO2 and breath sounds checked- equal and bilateral Secured at: 23 cm Tube secured with: Tape Dental Injury: Teeth and Oropharynx as per pre-operative assessment

## 2021-01-29 NOTE — H&P (Signed)
History of Present Illness: 01/29/2021 Mr. Lankford presents today with continued symptoms.  We will proceed with surgical intervention.   10/31/2020  Mr. Lusby returns to see me. He unfortunately has continued back pain after trying injections as well as physical therapy. He is here today to discuss surgery.  09/27/2020 Mr. Vanscyoc has had 9 visits of PT, and has improved.   His left leg pain is much improved. He continues to have back pain. He has been actively doing physical therapy. He thinks that he is 50% better than he was last time I saw him.  08/15/2020 Mr. Egon Dittus is here today with a chief complaint of low back pain and bilateral buttock pain that radiates down the back of both thighs. He denies weakness.  He has been having pain in his back and down his leg since March 2021. Standing and walking make his pain worse. Other activities also make his pain worse. He began having pain in his right buttock and down the back of his thigh, but is also had pain down his right leg as well. Sitting and decrease his activity have made his pain better.  Of note, he did have a left knee arthroplasty earlier this year. This helped his left knee pain to some degree, but he continued to have symptoms in his back. He denies bowel or bladder dysfunction. He presents today to discuss his options.  Conservative measures:  Physical therapy: participated 02/26/20-04/10/20 for knee and left leg weakness Multimodal medical therapy including regular antiinflammatories: tylenol, gabapentin, ibuprofen, hydrocodone Injections: has received epidural steroid injections without improvement 08/01/20: Left L4-5 and Right L5-S1 TFESI by Dr Alba Destine 07/04/20: Bilateral L5-S1 TFESI by Dr Alba Destine  Past Surgery: denies  [name] has no symptoms of cervical myelopathy.  The symptoms are causing a significant impact on the patient's life.   Review of Systems:  A 10 point review of systems is negative, except for  the pertinent positives and negatives detailed in the HPI.  Past Medical History: Past Medical History:  Diagnosis Date  . Hypertension  . Osteoarthritis  . Prostate cancer (CMS-HCC)   Past Surgical History: Past Surgical History:  Procedure Laterality Date  . CATARACT EXTRACTION 2021  . JOINT REPLACEMENT Left 2021  knee  . KNEE ARTHROSCOPY  . PROSTATECTOMY RETROPUBIC  . TONSILLECTOMY   Allergies  Allergen Reactions  . Percocet [Oxycodone-Acetaminophen] Nausea And Vomiting  . Tramadol Nausea And Vomiting  . Vicodin [Hydrocodone-Acetaminophen] Nausea And Vomiting    Current Meds  Medication Sig  . acetaminophen (TYLENOL) 500 MG tablet Take 2 tablets (1,000 mg total) by mouth every 6 (six) hours. (Patient taking differently: Take 1,000 mg by mouth every 6 (six) hours as needed for moderate pain.)  . ASPIRIN 81 PO Take 81 mg by mouth daily.  . Cholecalciferol (VITAMIN D3) 50 MCG (2000 UT) TABS Take 2,000 Units by mouth daily.  . Coenzyme Q10 100 MG TABS Take 100 mg by mouth daily.  . [employer] MELATONIN 10 MG CAPS TAKE 1 CAPSULE BY MOUTH AT BEDTIME (Patient taking differently: Take 10 mg by mouth at bedtime.)  . fluticasone (FLONASE) 50 MCG/ACT nasal spray PLACE 2 SPRAYS INTO EACH NOSTRIL DAILY  . gabapentin (NEURONTIN) 300 MG capsule Take 300 mg by mouth 2 (two) times daily.  . Glucosamine HCl (GLUCOSAMINE PO) Take 1,000 mg by mouth daily.  Marland Kitchen ibuprofen (ADVIL) 200 MG tablet Take 400 mg by mouth every 8 (eight) hours as needed (pain).  Marland Kitchen lisinopril (ZESTRIL) 10  MG tablet Take 1 tablet (10 mg total) by mouth every Monday, Tuesday, Wednesday, Thursday, and Friday.  . Multiple Vitamin (MULTIVITAMIN WITH MINERALS) TABS tablet Take 1 tablet by mouth daily. Men's Multivitamin 50+  . rosuvastatin (CRESTOR) 20 MG tablet Take one tablet by mouth on Monday, Wednesday, and Friday.  . zinc gluconate 50 MG tablet Take 50 mg by mouth daily.  . [DISCONTINUED] fluticasone (FLONASE) 50 MCG/ACT nasal  spray PLACE 2 SPRAYS INTO EACH NOSTRIL DAILY (Patient taking differently: Place 2 sprays into both nostrils daily. PLACE 2 SPRAYS INTO EACH NOSTRIL DAILY)  . [DISCONTINUED] simvastatin (ZOCOR) 20 MG tablet Take 1 tablet (20 mg total) by mouth every Monday, Tuesday, Wednesday, Thursday, and Friday. AT BEDTIME     Social History: Social History   Tobacco Use  . Smoking status: Never Smoker  . Smokeless tobacco: Never Used  Vaping Use  . Vaping Use: Never used  Substance Use Topics  . Alcohol use: No  . Drug use: No   Family Medical History: No family history on file.  Physical Examination:  Vitals:   01/29/21 1159  BP: (!) 166/81  Pulse: 74  Resp: 14  Temp: 97.7 F (36.5 C)  SpO2: 99%    Heart sounds normal no MRG. Chest Clear to Auscultation Bilaterally.   General: Patient is well developed, well nourished, calm, collected, and in no apparent distress. Attention to examination is appropriate.  Psychiatric: Patient is non-anxious.  Head: Pupils equal, round, and reactive to light.  ENT: Oral mucosa appears well hydrated.  Neck: Supple. Full range of motion.  Respiratory: Patient is breathing without any difficulty.  Extremities: No edema.  Vascular: Palpable dorsal pedal pulses.  Skin: On exposed skin, there are no abnormal skin lesions.  NEUROLOGICAL:   Awake, alert, oriented to person, place, and time. Speech is clear and fluent. Fund of knowledge is appropriate.   Cranial Nerves: Pupils equal round and reactive to light. Facial tone is symmetric. Facial sensation is symmetric. Shoulder shrug is symmetric. Tongue protrusion is midline. There is no pronator drift.  ROM of spine: diminished.  Strength: Side Biceps Triceps Deltoid Interossei Grip Wrist Ext. Wrist Flex.  R 5 5 5 5 5 5 5   L 5 5 5 5 5 5 5    Side Iliopsoas Quads Hamstring PF DF EHL  R 5 5 5 5 5 5   L 5 5 5 5 5 5    Reflexes are 1+ and symmetric at the biceps, triceps, brachioradialis,  patella and achilles. Hoffman's is absent. Clonus is not present. Toes are down-going.  Bilateral upper and lower extremity sensation is intact to light touch.  Gait is antalgic. No evidence of dysmetria noted.  Medical Decision Making  Imaging: MRI L spine 06/21/2020 IMPRESSION:  1. Transitional lumbosacral anatomy.  2. Advanced L4-5 facet arthrosis with grade 1 anterolisthesis and a  large right paracentral/subarticular pseudo disc extrusion  contributing to severe spinal and neural foraminal stenosis.  3. Moderate spinal stenosis at L3-4 and mild spinal stenosis at  L2-3.  4. Moderate to severe left neural foraminal stenosis at L5-S1.   Electronically Signed  By: Logan Bores M.D.  On: 06/21/2020 07:53  I have personally reviewed the images and agree with the above interpretation.  Assessment and Plan: Mr. Reinoso is a pleasant 70 y.o. male with severe lumbar degenerative disc disease with severe stenosis at L4-5 and severe neuroforaminal stenosis at L5-S1 as well as moderate spinal stenosis at L3-4 and severe degenerative disc disease at L2-3.  He has facet arthrosis from L2-3 down to L5-S1.  After full discussion, we will proceed with L4-5 lateral lumbar interbody fusion with percutaneous fixation and posterior fusion.  Meade Maw MD, [facility] Department of Neurosurgery

## 2021-01-29 NOTE — Progress Notes (Signed)
Pharmacy Antibiotic Note  Christopher Huang is a 70 y.o. male with planned surgical procedure scheduled for 3/9. Pharmacy has been consulted for vancomycin and cefazolin pre-op dosing.  Plan: Will order Vancomycin 15mg /kg (1500mg ) x 1 dose 90 minutes prior to surgery.  Will order cefazolin 2g IV 30 minutes pre-op    No data recorded.  No results for input(s): WBC, CREATININE, LATICACIDVEN, VANCOTROUGH, VANCOPEAK, VANCORANDOM, GENTTROUGH, GENTPEAK, GENTRANDOM, TOBRATROUGH, TOBRAPEAK, TOBRARND, AMIKACINPEAK, AMIKACINTROU, AMIKACIN in the last 168 hours.  CrCl cannot be calculated (Patient's most recent lab result is older than the maximum 21 days allowed.).    Allergies  Allergen Reactions  . Percocet [Oxycodone-Acetaminophen] Nausea And Vomiting  . Tramadol Nausea And Vomiting  . Vicodin [Hydrocodone-Acetaminophen] Nausea And Vomiting    Thank you for allowing pharmacy to be a part of this patient's care.  Pernell Dupre, PharmD, BCPS Clinical Pharmacist 01/29/2021 11:33 AM

## 2021-01-29 NOTE — Anesthesia Preprocedure Evaluation (Addendum)
Anesthesia Evaluation  Patient identified by MRN, date of birth, ID band Patient awake    Reviewed: Allergy & Precautions, H&P , NPO status , Patient's Chart, lab work & pertinent test results  History of Anesthesia Complications Negative for: history of anesthetic complications  Airway Mallampati: II  TM Distance: >3 FB     Dental  (+) Chipped   Pulmonary neg pulmonary ROS, neg sleep apnea, neg COPD,    breath sounds clear to auscultation       Cardiovascular hypertension, (-) angina(-) Past MI and (-) Cardiac Stents (-) dysrhythmias  Rhythm:regular Rate:Normal     Neuro/Psych negative psych ROS   GI/Hepatic negative GI ROS, Neg liver ROS,   Endo/Other  negative endocrine ROS  Renal/GU      Musculoskeletal   Abdominal   Peds  Hematology negative hematology ROS (+)   Anesthesia Other Findings Past Medical History: No date: Allergic rhinitis 4/08: Cancer of prostate ([place])     Comment:  s/p surgery No date: History of kidney stones     Comment:  H/O No date: Hyperlipidemia No date: Hypertension No date: Personal history of kidney stones  Past Surgical History: No date: basal skin cancers 11/09/2017: COLONOSCOPY WITH PROPOFOL; N/A     Comment:  Procedure: COLONOSCOPY WITH PROPOFOL;  Surgeon: Lucilla Lame, MD;  Location: [facility] ENDOSCOPY;  Service:               Endoscopy;  Laterality: N/A; No date: EYE SURGERY; Bilateral     Comment:  Cataract No date: HAND SURGERY     Comment:  THUMB SURGERY No date: KNEE ARTHROSCOPY No date: prostate cancer removed No date: TONSILLECTOMY No date: TONSILLECTOMY 02/09/2020: TOTAL KNEE ARTHROPLASTY; Left     Comment:  Procedure: TOTAL KNEE ARTHROPLASTY - RNFA;  Surgeon:               Hessie Knows, MD;  Location: [facility] ORS;  Service:               Orthopedics;  Laterality: Left;  BMI    Body Mass Index: 30.48 kg/m      Reproductive/Obstetrics negative  OB ROS                            Anesthesia Physical Anesthesia Plan  ASA: II  Anesthesia Plan: General ETT   Post-op Pain Management:    Induction:   PONV Risk Score and Plan: Ondansetron, Dexamethasone, Treatment may vary due to age or medical condition and TIVA  Airway Management Planned:   Additional Equipment:   Intra-op Plan:   Post-operative Plan:   Informed Consent: I have reviewed the patients History and Physical, chart, labs and discussed the procedure including the risks, benefits and alternatives for the proposed anesthesia with the patient or authorized representative who has indicated his/her understanding and acceptance.     Dental Advisory Given  Plan Discussed with: Anesthesiologist, CRNA and Surgeon  Anesthesia Plan Comments:        Anesthesia Quick Evaluation

## 2021-01-29 NOTE — Progress Notes (Signed)
PATIENT IS DOING GREATAND IN ROOM, ABLE TO GRIP EQUALLY AND STRONG, SPEECH CLEAR, DORI AND PLANTAR FLEXION INTACT AND STRONG, NO COMPLAINTS OF NUMBNESS OR TINGLING WHILE IN PACU. NASAL CANNULA 2L APPLIED WHILE PATIENT ASLEEP BECAUSE OF NARCOTICS. WIFE UPDATED THROUGH TEXT MESSAGE, DID NOT ANSWER PHONE CALL.

## 2021-01-29 NOTE — Transfer of Care (Signed)
Immediate Anesthesia Transfer of Care Note  Patient: Christopher Huang  Procedure(s) Performed: Procedure(s): L4-5 LATERAL INTERBODY FUSION, L4-5 POSTERIOR FUSION (N/A)  Patient Location: PACU  Anesthesia Type:General  Level of Consciousness: sedated  Airway & Oxygen Therapy: Patient Spontanous Breathing and Patient connected to face mask oxygen  Post-op Assessment: Report given to RN and Post -op Vital signs reviewed and stable  Post vital signs: Reviewed and stable  Last Vitals:  Vitals:   01/29/21 1159 01/29/21 1630  BP: (!) 166/81 (!) 143/69  Pulse: 74 61  Resp: 14 13  Temp: 36.5 C   SpO2: 43% 83%    Complications: No apparent anesthesia complications

## 2021-01-30 ENCOUNTER — Inpatient Hospital Stay: Payer: PPO

## 2021-01-30 ENCOUNTER — Encounter: Payer: Self-pay | Admitting: Neurosurgery

## 2021-01-30 IMAGING — CR DG LUMBAR SPINE 2-3V
1 series · 2 of 2 positions shown · non-contrast
Comparison: MRI [DATE].

CLINICAL DATA: Lumbar fusion.

EXAM:
LUMBAR SPINE - 2-3 VIEW

[Series 1: dg lumbar spine 2-3 views · 0.14mm/px · 2 of 2 slices shown]
[im 1/2]
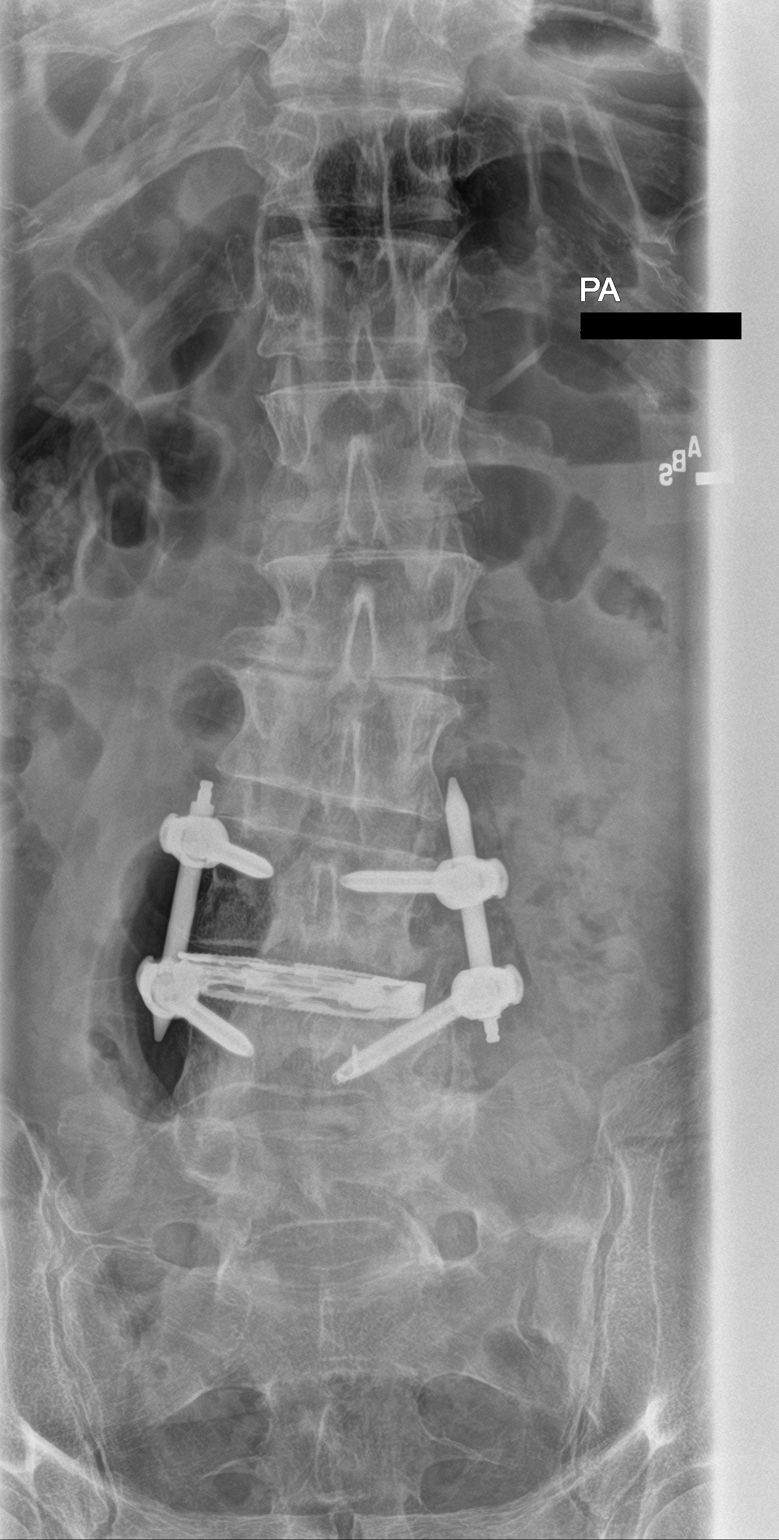
[im 2/2]
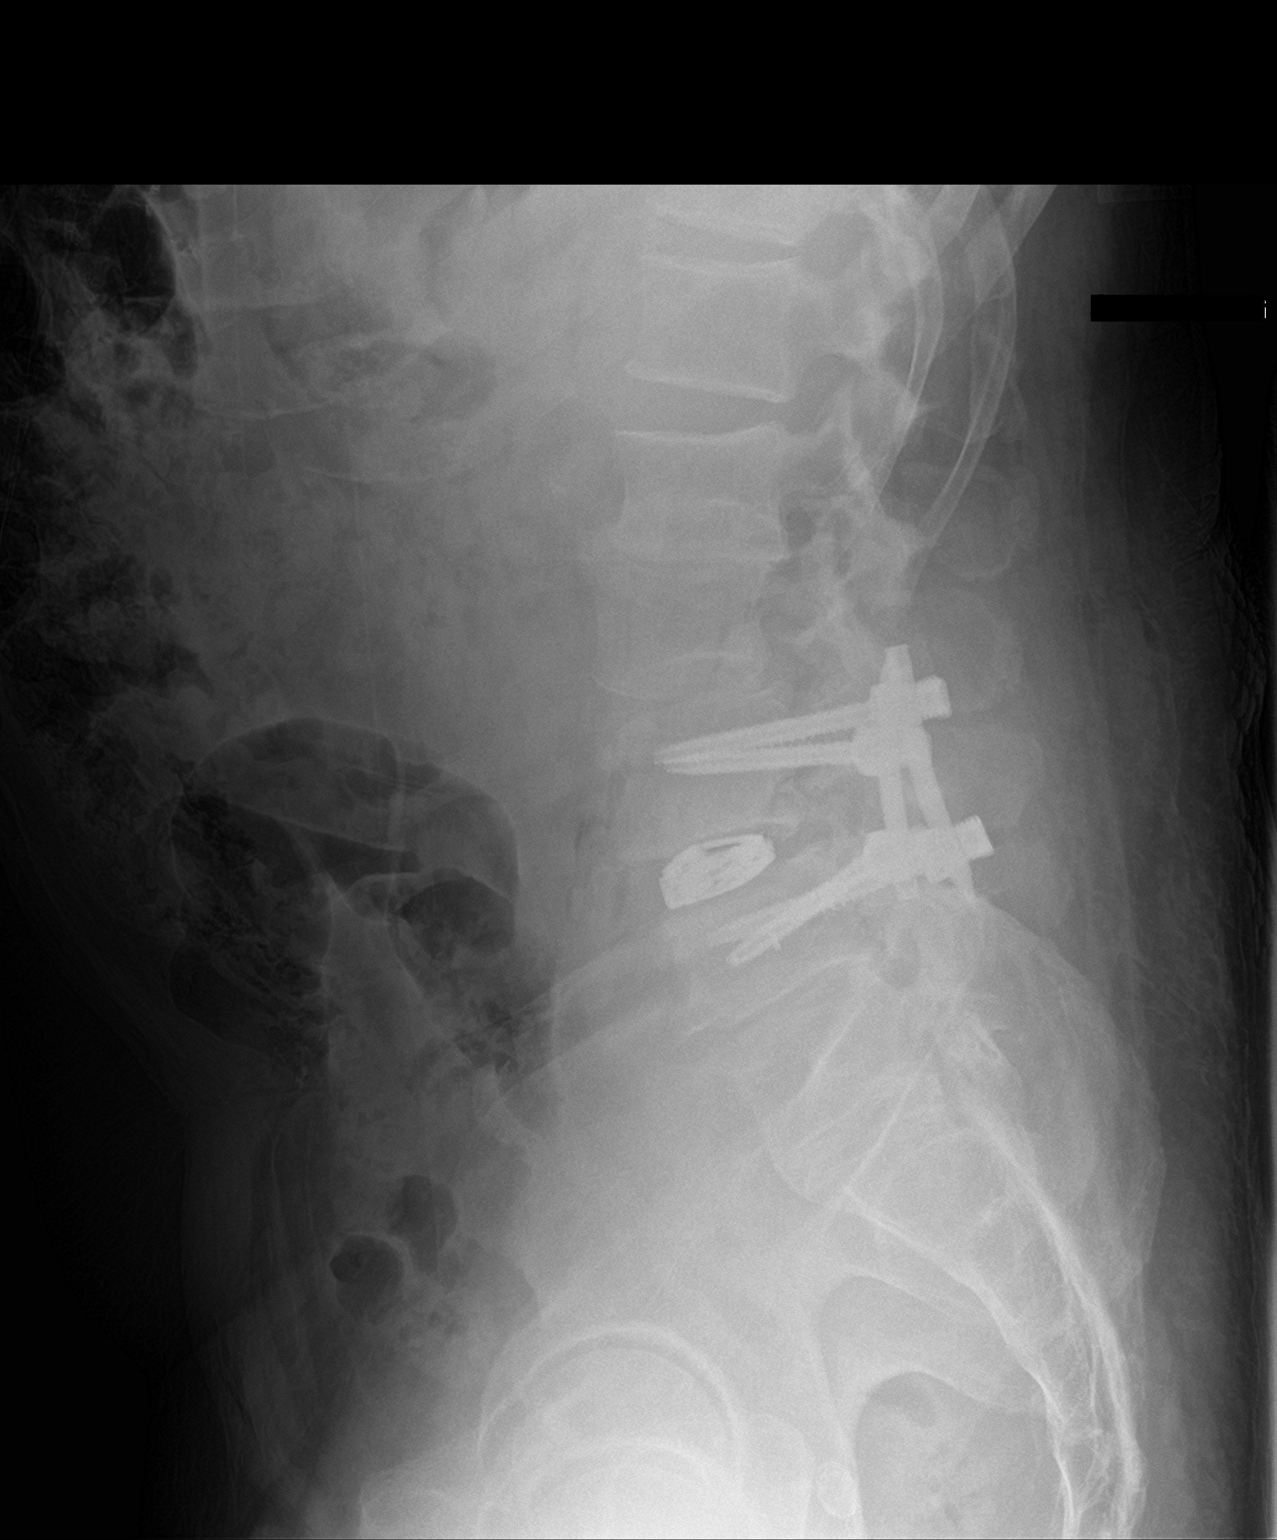

[2 of 2 positions shown; findings below may reference images not displayed]

FINDINGS: Lumbar spine numbered as per prior MRI. Partial lumbarization of the
first sacral vertebra again noted. L4-L5 posterior interbody fusion.
Hardware intact. Anatomic alignment. Lumbar scoliosis and
degenerative change again noted.
IMPRESSION: L4-L5 posterior and interbody fusion. Hardware intact. Anatomic
alignment.

## 2021-01-30 MED ORDER — METHOCARBAMOL 500 MG PO TABS: 500.0000 mg | ORAL_TABLET | Freq: Four times a day (QID) | ORAL | 0 refills | Status: DC | PRN

## 2021-01-30 MED ORDER — CELECOXIB 100 MG PO CAPS
100.0000 mg | ORAL_CAPSULE | Freq: Two times a day (BID) | ORAL | Status: DC
  Administered 2021-01-30: 100 mg via ORAL
  Filled 2021-01-30 (×2): qty 1

## 2021-01-30 MED ORDER — OXYCODONE HCL 10 MG PO TABS: 10.0000 mg | ORAL_TABLET | ORAL | 0 refills | Status: DC | PRN

## 2021-01-30 MED ORDER — CELECOXIB 100 MG PO CAPS: 100.0000 mg | ORAL_CAPSULE | Freq: Two times a day (BID) | ORAL | 0 refills | Status: AC

## 2021-01-30 NOTE — Plan of Care (Signed)
°  Problem: Education: Goal: Knowledge of General Education information will improve Description: Including pain rating scale, medication(s)/side effects and non-pharmacologic comfort measures Outcome: Progressing   Problem: Coping: Goal: Level of anxiety will decrease Outcome: Progressing   Problem: Safety: Goal: Ability to remain free from injury will improve Outcome: Progressing   Problem: Pain Managment: Goal: General experience of comfort will improve Outcome: Progressing

## 2021-01-30 NOTE — TOC Initial Note (Signed)
Transition of Care [facility]) - Initial/Assessment Note    Patient Details  Name: Christopher Huang MRN: [id number] Date of Birth: 09-Jan-1951  Transition of Care [facility]) CM/SW Contact:    Pete Pelt, RN Phone Number: 01/30/2021, 2:20 PM  Clinical Narrative:   TOC saw patient and spouse at bedside, introductions made.  Reason for visit:  Discharge planning.  Patient expects to return home, wife can assist.  PT pre-arranged with [facility] by MD office.  PT verbally stated no DME needs.  Patient has no concerns about transportation to appointments or obtaining prescriptions.  TOC will follow until discharge, contact information given to patient and spouse for any questions or concerns.                Expected Discharge Plan: [place] Barriers to Discharge: No Barriers Identified   Patient Goals and CMS Choice     Choice offered to / list presented to : NA  Expected Discharge Plan and Services Expected Discharge Plan: [place] In-house Referral: NA   Post Acute Care Choice: NA Living arrangements for the past 2 months: Single Family Home                           HH Arranged: PT (Arranged by MD office) [place] Agency: [facility] [employer] (Pre arranged by MD office)        Prior Living Arrangements/Services Living arrangements for the past 2 months: Single Family Home Lives with:: Spouse Patient language and need for interpreter reviewed:: Yes Do you feel safe going back to the place where you live?: Yes      Need for Family Participation in Patient Care: Yes (Comment) Care giver support system in place?: Yes (comment)   Criminal Activity/Legal Involvement Pertinent to Current Situation/Hospitalization: No - Comment as needed  Activities of Daily Living Home Assistive Devices/Equipment: Eyeglasses ADL Screening (condition at time of admission) Patient's cognitive ability adequate to safely complete daily activities?: Yes Is the patient  deaf or have difficulty hearing?: No Does the patient have difficulty seeing, even when wearing glasses/contacts?: No Does the patient have difficulty concentrating, remembering, or making decisions?: No Patient able to express need for assistance with ADLs?: Yes Does the patient have difficulty dressing or bathing?: No Independently performs ADLs?: Yes (appropriate for developmental age) Does the patient have difficulty walking or climbing stairs?: No Weakness of Legs: None Weakness of Arms/Hands: None  Permission Sought/Granted   Permission granted to share information with : Yes, Verbal Permission Granted     Permission granted to share info w AGENCY: [facility]        Emotional Assessment Appearance:: Appears stated age Attitude/Demeanor/Rapport: Engaged,Gracious Affect (typically observed): Pleasant,Quiet,Appropriate Orientation: : Oriented to Self,Oriented to Place,Oriented to  Time,Oriented to Situation Alcohol / Substance Use: Not Applicable Psych Involvement: No (comment)  Admission diagnosis:  Anterolisthesis [M43.10] Patient Active Problem List   Diagnosis Date Noted  . Anterolisthesis 01/29/2021  . Preoperative clearance 11/26/2020  . Knee joint replacement status, left 02/09/2020  . Insomnia 02/07/2020  . Advanced care planning/counseling discussion 02/02/2019  . Arthritis of hand 11/25/2018  . Personal history of colonic polyps   . Allergic rhinitis   . History of prostate cancer   . Hyperlipidemia   . Hypertension   . Personal history of kidney stones    PCP:  Venita Lick, NP Pharmacy:   [id number] [place], [place]  [address] [place] [address] Phone: [phone] Fax: [phone]  [facility] [place] [place] [facility] [address] [place] [address] Phone: [phone] Fax: [phone]  [facility] - [place], [place] [address] Phone: [phone] Fax: [phone]     Social Determinants of Health (SDOH) Interventions    Readmission Risk Interventions No flowsheet data found.

## 2021-01-30 NOTE — Discharge Instructions (Signed)
Your surgeon has performed an operation on your lumbar spine (low back) to relieve pressure on one or more nerves. Many times, patients feel better immediately after surgery and can "overdo it." Even if you feel well, it is important that you follow these activity guidelines. If you do not let your back heal properly from the surgery, you can increase the chance of a disc herniation and/or return of your symptoms. The following are instructions to help in your recovery once you have been discharged from the hospital.  * Do not take anti-inflammatory medications for 3 days after surgery (naproxen [Aleve], ibuprofen [Advil, Motrin], etc.)  OK to take celebrex.  Activity    No bending, lifting, or twisting ("BLT"). Avoid lifting objects heavier than 10 pounds (gallon milk jug).  Where possible, avoid household activities that involve lifting, bending, pushing, or pulling such as laundry, vacuuming, grocery shopping, and childcare. Try to arrange for help from friends and family for these activities while your back heals.  Increase physical activity slowly as tolerated.  Taking short walks is encouraged, but avoid strenuous exercise. Do not jog, run, bicycle, lift weights, or participate in any other exercises unless specifically allowed by your doctor. Avoid prolonged sitting, including car rides.  Talk to your doctor before resuming sexual activity.  You should not drive until cleared by your doctor.  Until released by your doctor, you should not return to work or school.  You should rest at home and let your body heal.   You may shower two days after your surgery.  After showering, lightly dab your incision dry. Do not take a tub bath or go swimming for 3 weeks, or until approved by your doctor at your follow-up appointment.  If you smoke, we strongly recommend that you quit.  Smoking has been proven to interfere with normal healing in your back and will dramatically reduce the success rate of  your surgery. Please contact [facility] (800-QUIT-NOW) and use the resources at www.[facility].com for assistance in stopping smoking.  Surgical Incision   If you have a dressing on your incision, you may remove it three days after your surgery. Keep your incision area clean and dry.  If you have staples or stitches on your incision, you should have a follow up scheduled for removal. If you do not have staples or stitches, you will have steri-strips (small pieces of surgical tape) or Dermabond glue. The steri-strips/glue should begin to peel away within about a week (it is fine if the steri-strips fall off before then). If the strips are still in place one week after your surgery, you may gently remove them.  Diet            You may return to your usual diet. Be sure to stay hydrated.  When to Contact us  Although your surgery and recovery will likely be uneventful, you may have some residual numbness, aches, and pains in your back and/or legs. This is normal and should improve in the next few weeks.  However, should you experience any of the following, contact us immediately: . New numbness or weakness . Pain that is progressively getting worse, and is not relieved by your pain medications or rest . Bleeding, redness, swelling, pain, or drainage from surgical incision . Chills or flu-like symptoms . Fever greater than 101.0 F (38.3 C) . Problems with bowel or bladder functions . Difficulty breathing or shortness of breath . Warmth, tenderness, or swelling in your calf  Contact Information . During  office hours (Monday-Friday 9 am to 5 pm), please call your physician at [phone] . After hours and weekends, please call [phone] and speak with the answering service, who will contact the doctor on call.  If that fails, call the [place] Operator at [phone] and ask for the Neurosurgery Resident On Call  . For a life-threatening emergency, call 911

## 2021-01-30 NOTE — Evaluation (Signed)
Occupational Therapy Evaluation Patient Details Name: Christopher Huang MRN: [id number] DOB: 10-28-1951 Today's Date: 01/30/2021    History of Present Illness 70 yo male who presented with anterolisthesis.  He failed conservative management and elected for surgical intervention and s/p L4-L5 fusion.   Clinical Impression   Upon entering the room, pt supine in bed with wife present in the room. Pt reports at baseline living with wife and being mod I with use of RW or SPC as needed. OT reviewed back precautions for comfort in which pt verbalized performing many of these prior to admission for pain management. Pt demonstrated functional mobility and self care without use of AD at mod I level. Wife also assisting pt to bathroom at end of session without concern. Pt and spouse with no further questions at this time. Pt with no skilled OT needs. OT to SIGN OFF at this time. Thank you for this referral.     Follow Up Recommendations  No OT follow up    Equipment Recommendations  None recommended by OT       Precautions / Restrictions Precautions Precautions: Fall Precaution Comments: back precautions for comfort Restrictions Other Position/Activity Restrictions: no brace needed per order      Mobility Bed Mobility Overal bed mobility: Modified Independent                  Transfers Overall transfer level: Modified independent Equipment used: None             General transfer comment: supervison progressing to mod I    Balance Overall balance assessment: Modified Independent                                         ADL either performed or assessed with clinical judgement   ADL Overall ADL's : Modified independent                                             Vision Baseline Vision/History: Wears glasses Patient Visual Report: No change from baseline              Pertinent Vitals/Pain Pain Assessment: No/denies pain      Hand Dominance Right   Extremity/Trunk Assessment Upper Extremity Assessment Upper Extremity Assessment: Overall WFL for tasks assessed   Lower Extremity Assessment Lower Extremity Assessment: Overall WFL for tasks assessed       Communication Communication Communication: No difficulties   Cognition Arousal/Alertness: Awake/alert Behavior During Therapy: WFL for tasks assessed/performed Overall Cognitive Status: Within Functional Limits for tasks assessed                                                Home Living Family/patient expects to be discharged to:: Private residence Living Arrangements: Spouse/significant other Available Help at Discharge: Family;Available 24 hours/day Type of Home: House Home Access: Stairs to enter [employer] of Steps: 8 in back with B hand rails Entrance Stairs-Rails: Right;Left;Can reach both Home Layout: One level     Bathroom Shower/Tub: Occupational psychologist: Handicapped height     Home Equipment: Environmental consultant - 2 wheels;Cane - single point;Shower seat  Prior Functioning/Environment Level of Independence: Independent                         OT Goals(Current goals can be found in the care plan section) Acute Rehab OT Goals Patient Stated Goal: to go home OT Goal Formulation: With patient/family Potential to Achieve Goals: Good  OT Frequency:      AM-PAC OT "6 Clicks" Daily Activity     Outcome Measure Help from another person eating meals?: None Help from another person taking care of personal grooming?: None Help from another person toileting, which includes using toliet, bedpan, or urinal?: None Help from another person bathing (including washing, rinsing, drying)?: None Help from another person to put on and taking off regular upper body clothing?: None Help from another person to put on and taking off regular lower body clothing?: None 6 Click Score: 24   End of  Session Nurse Communication: Mobility status  Activity Tolerance: Patient tolerated treatment well Patient left: in chair;with call bell/phone within reach;with family/visitor present                   Time: 0932-3557 OT Time Calculation (min): 26 min Charges:  OT General Charges $OT Visit: 1 Visit OT Evaluation $OT Eval Low Complexity: 1 Low OT Treatments $Self Care/Home Management : 8-22 mins  Darleen Crocker, MS, OTR/L , CBIS ascom [phone]  01/30/21, 12:29 PM

## 2021-01-30 NOTE — Progress Notes (Signed)
    Attending Progress Note  History: Christopher Huang is here for anterolisthesis.  POD1: Doing well. Leg pain improved.  Expected back pain.   Physical Exam: Vitals:   01/30/21 0121 01/30/21 0546  BP: (!) 150/84 139/74  Pulse: 71 73  Resp: 18 18  Temp: (!) 97.5 F (36.4 C) 97.7 F (36.5 C)  SpO2: 98% 97%    AA Ox3 CNI  Strength:5/5 throughout BLE Incisions c/d/i  Data:  No results for input(s): NA, K, CL, CO2, BUN, CREATININE, LABGLOM, GLUCOSE, CALCIUM in the last 168 hours. No results for input(s): AST, ALT, ALKPHOS in the last 168 hours.  Invalid input(s): TBILI   No results for input(s): WBC, HGB, HCT, PLT in the last 168 hours. No results for input(s): APTT, INR in the last 168 hours.       Other tests/results: xrays pending  Assessment/Plan:  Christopher Huang is doing well after L4-5 XLIF/PSF.  - mobilize - pain control - DVT prophylaxis - PTOT - L spine films today   Meade Maw MD, [facility] Department of Neurosurgery

## 2021-01-30 NOTE — Evaluation (Signed)
Physical Therapy Evaluation Patient Details Name: Christopher Huang MRN: [id number] DOB: 14-Jul-1951 Today's Date: 01/30/2021   History of Present Illness  70 yo male who presented with anterolisthesis.  He failed conservative management and elected for surgical intervention and s/p L4-L5 fusion.    Clinical Impression  Pt received in recliner and ready for therapy.  Pt has adequate MMT of the LE's and was able to perform mobility from the recliner and bed with modI.  Pt able to ambulate and navigate hospital room with CGA transitioning to supervision as therapy progressed.  Pt then performed stair training in the therapy gym, 8 steps, with use of bilateral hand rails as he has at home.  Pt then ambulated back to room and left in bed with wife present in room.  Pt presented with d/c options and may want OPPT after d/c from the hospital.  Patient is at baseline, all education completed, and time is given to address all questions/concerns. No additional skilled PT services needed at this time, PT signing off. PT recommends daily ambulation ad lib or with nursing staff as needed to prevent deconditioning.      Follow Up Recommendations Outpatient PT    Equipment Recommendations  None recommended by PT    Recommendations for Other Services       Precautions / Restrictions Precautions Precautions: Fall Precaution Comments: back precautions for comfort Restrictions Other Position/Activity Restrictions: no brace needed per order      Mobility  Bed Mobility Overal bed mobility: Modified Independent                  Transfers Overall transfer level: Modified independent Equipment used: None             General transfer comment: supervison progressing to mod I  Ambulation/Gait Ambulation/Gait assistance: Supervision Gait Distance (Feet): 200 Feet Assistive device: None Gait Pattern/deviations: WFL(Within Functional Limits) Gait velocity: WNL   General Gait Details: Pt  able to ambulate with CGA transitioning to Supervision.  Stairs Stairs: Yes Stairs assistance: Modified independent (Device/Increase time) Stair Management: Two rails Number of Stairs: 8 General stair comments: Pt able to ambulate well with no AD.  Wheelchair Mobility    Modified Rankin (Stroke Patients Only)       Balance Overall balance assessment: Modified Independent                                           Pertinent Vitals/Pain Pain Assessment: No/denies pain    Home Living Family/patient expects to be discharged to:: Private residence Living Arrangements: Spouse/significant other Available Help at Discharge: Family;Available 24 hours/day Type of Home: House Home Access: Stairs to enter Entrance Stairs-Rails: Right;Left;Can reach both Entrance Stairs-Number of Steps: 8 in back with B hand rails Home Layout: One level Home Equipment: Walker - 2 wheels;Cane - single point;Shower seat      Prior Function Level of Independence: Independent               Hand Dominance   Dominant Hand: Right    Extremity/Trunk Assessment   Upper Extremity Assessment Upper Extremity Assessment: Overall WFL for tasks assessed    Lower Extremity Assessment Lower Extremity Assessment: Overall WFL for tasks assessed       Communication   Communication: No difficulties  Cognition Arousal/Alertness: Awake/alert Behavior During Therapy: WFL for tasks assessed/performed Overall Cognitive Status: Within Functional Limits for  tasks assessed                                        General Comments      Exercises     Assessment/Plan    PT Assessment Patent does not need any further PT services  PT Problem List         PT Treatment Interventions      PT Goals (Current goals can be found in the Care Plan section)  Acute Rehab PT Goals Patient Stated Goal: to go home PT Goal Formulation: With patient Time For Goal Achievement:  02/13/21 Potential to Achieve Goals: Good    Frequency     Barriers to discharge        Co-evaluation               AM-PAC PT "6 Clicks" Mobility  Outcome Measure Help needed turning from your back to your side while in a flat bed without using bedrails?: None Help needed moving from lying on your back to sitting on the side of a flat bed without using bedrails?: None Help needed moving to and from a bed to a chair (including a wheelchair)?: None Help needed standing up from a chair using your arms (e.g., wheelchair or bedside chair)?: None Help needed to walk in hospital room?: None Help needed climbing 3-5 steps with a railing? : None 6 Click Score: 24    End of Session Equipment Utilized During Treatment: Gait belt Activity Tolerance: Patient tolerated treatment well Patient left: in bed;with call bell/phone within reach;with family/visitor present Nurse Communication: Mobility status PT Visit Diagnosis: Difficulty in walking, not elsewhere classified (R26.2)    Time: 4097-3532 PT Time Calculation (min) (ACUTE ONLY): 36 min   Charges:   PT Evaluation $PT Eval Low Complexity: 1 Low PT Treatments $Gait Training: 23-37 mins        Gwenlyn Saran, PT, DPT 01/30/21, 2:53 PM

## 2021-01-30 NOTE — Discharge Summary (Signed)
Physician Discharge Summary  Patient ID: Christopher Huang MRN: [id number] DOB/AGE: Mar 11, 1951 70 y.o.  Admit date: 01/29/2021 Discharge date: 01/30/2021  Admission Diagnoses: anterolisthesis  Discharge Diagnoses:  Active Problems:   Anterolisthesis   Discharged Condition: good  Hospital Course:  Christopher Huang was admitted for surgery for anterolisthesis.  He did well with surgery, ambulated, and had his pain under control for discharge on POD1.  Consults: None  Significant Diagnostic Studies: radiology: X-Ray: L spine xrays showing good placement.  Treatments: surgery: L4-5 XLIF/PSF  Discharge Exam: Blood pressure (!) 148/71, pulse 80, temperature 98.4 F (36.9 C), temperature source Oral, resp. rate 18, height 6\' 1"  (1.854 m), weight 104.8 kg, SpO2 99 %. General appearance: alert and cooperative   5/5 throughout Incision c/d/i  Disposition: Discharge disposition: 01-Home or Self Care       Discharge Instructions    Incentive spirometry RT   Complete by: As directed      Allergies as of 01/30/2021      Reactions   Percocet [oxycodone-acetaminophen] Nausea And Vomiting   Tramadol Nausea And Vomiting   Vicodin [hydrocodone-acetaminophen] Nausea And Vomiting      Medication List    STOP taking these medications   ibuprofen 200 MG tablet Commonly known as: ADVIL     TAKE these medications   acetaminophen 500 MG tablet Commonly known as: TYLENOL Take 2 tablets (1,000 mg total) by mouth every 6 (six) hours. What changed:   when to take this  reasons to take this   ASPIRIN 81 PO Take 81 mg by mouth daily.   celecoxib 100 MG capsule Commonly known as: CELEBREX Take 1 capsule (100 mg total) by mouth 2 (two) times daily.   Coenzyme Q10 100 MG Tabs Take 100 mg by mouth daily.   [facility] Melatonin 10 MG Caps Generic drug: Melatonin TAKE 1 CAPSULE BY MOUTH AT BEDTIME What changed: how much to take   fluticasone 50 MCG/ACT nasal spray Commonly known as:  FLONASE PLACE 2 SPRAYS INTO EACH NOSTRIL DAILY   gabapentin 300 MG capsule Commonly known as: NEURONTIN Take 300 mg by mouth 2 (two) times daily.   GLUCOSAMINE PO Take 1,000 mg by mouth daily.   lisinopril 10 MG tablet Commonly known as: ZESTRIL Take 1 tablet (10 mg total) by mouth every Monday, Tuesday, Wednesday, Thursday, and Friday.   methocarbamol 500 MG tablet Commonly known as: ROBAXIN Take 1 tablet (500 mg total) by mouth every 6 (six) hours as needed for muscle spasms.   multivitamin with minerals Tabs tablet Take 1 tablet by mouth daily. Men's Multivitamin 50+   Oxycodone HCl 10 MG Tabs Take 1 tablet (10 mg total) by mouth every 3 (three) hours as needed for severe pain ((score 7 to 10)).   rosuvastatin 20 MG tablet Commonly known as: Crestor Take one tablet by mouth on Monday, Wednesday, and Friday.   Vitamin D3 50 MCG (2000 UT) Tabs Take 2,000 Units by mouth daily.   zinc gluconate 50 MG tablet Take 50 mg by mouth daily.       Follow-up Information    Meade Maw, MD Follow up in 2 week(s).   Specialty: Neurosurgery Why: already scheduled Contact information: [place] [address] [phone]               Signed: Meade Maw 01/30/2021, 5:30 PM

## 2021-01-30 NOTE — Plan of Care (Signed)

## 2021-02-01 DIAGNOSIS — I10 Essential (primary) hypertension: Secondary | ICD-10-CM | POA: Diagnosis not present

## 2021-02-01 DIAGNOSIS — E785 Hyperlipidemia, unspecified: Secondary | ICD-10-CM | POA: Diagnosis not present

## 2021-02-01 DIAGNOSIS — Z4789 Encounter for other orthopedic aftercare: Secondary | ICD-10-CM | POA: Diagnosis not present

## 2021-02-01 DIAGNOSIS — M4319 Spondylolisthesis, multiple sites in spine: Secondary | ICD-10-CM | POA: Diagnosis not present

## 2021-02-01 DIAGNOSIS — R2681 Unsteadiness on feet: Secondary | ICD-10-CM | POA: Diagnosis not present

## 2021-02-05 ENCOUNTER — Ambulatory Visit: Payer: PPO

## 2021-02-07 ENCOUNTER — Ambulatory Visit (INDEPENDENT_AMBULATORY_CARE_PROVIDER_SITE_OTHER): Payer: PPO

## 2021-02-07 VITALS — Ht 73.0 in | Wt 220.0 lb

## 2021-02-07 DIAGNOSIS — Z0000 Encounter for general adult medical examination without abnormal findings: Secondary | ICD-10-CM | POA: Diagnosis not present

## 2021-02-07 NOTE — Patient Instructions (Signed)
Christopher Huang , Thank you for taking time to come for your Medicare Wellness Visit. I appreciate your ongoing commitment to your health goals. Please review the following plan we discussed and let me know if I can assist you in the future.   Screening recommendations/referrals: Colonoscopy: completed 11/09/2017, due 11/09/2022 Recommended yearly ophthalmology/optometry visit for glaucoma screening and checkup Recommended yearly dental visit for hygiene and checkup  Vaccinations: Influenza vaccine: completed 08/16/2020, due 06/23/2021 Pneumococcal vaccine: completed 11/12/2017 Tdap vaccine: completed 11/28/2015, due 11/27/2025 Shingles vaccine: discussed   Covid-19:  09/12/2020, 01/24/2020, 01/03/2020  Advanced directives: copy in chart  Conditions/risks identified: none  Next appointment: Follow up in one year for your annual wellness visit.   Preventive Care 68 Years and Older, Male Preventive care refers to lifestyle choices and visits with your health care provider that can promote health and wellness. What does preventive care include?  A yearly physical exam. This is also called an annual well check.  Dental exams once or twice a year.  Routine eye exams. Ask your health care provider how often you should have your eyes checked.  Personal lifestyle choices, including:  Daily care of your teeth and gums.  Regular physical activity.  Eating a healthy diet.  Avoiding tobacco and drug use.  Limiting alcohol use.  Practicing safe sex.  Taking low doses of aspirin every day.  Taking vitamin and mineral supplements as recommended by your health care provider. What happens during an annual well check? The services and screenings done by your health care provider during your annual well check will depend on your age, overall health, lifestyle risk factors, and family history of disease. Counseling  Your health care provider may ask you questions about your:  Alcohol  use.  Tobacco use.  Drug use.  Emotional well-being.  Home and relationship well-being.  Sexual activity.  Eating habits.  History of falls.  Memory and ability to understand (cognition).  Work and work Statistician. Screening  You may have the following tests or measurements:  Height, weight, and BMI.  Blood pressure.  Lipid and cholesterol levels. These may be checked every 5 years, or more frequently if you are over 61 years old.  Skin check.  Lung cancer screening. You may have this screening every year starting at age 58 if you have a 30-pack-year history of smoking and currently smoke or have quit within the past 15 years.  Fecal occult blood test (FOBT) of the stool. You may have this test every year starting at age 87.  Flexible sigmoidoscopy or colonoscopy. You may have a sigmoidoscopy every 5 years or a colonoscopy every 10 years starting at age 79.  Prostate cancer screening. Recommendations will vary depending on your family history and other risks.  Hepatitis C blood test.  Hepatitis B blood test.  Sexually transmitted disease (STD) testing.  Diabetes screening. This is done by checking your blood sugar (glucose) after you have not eaten for a while (fasting). You may have this done every 1-3 years.  Abdominal aortic aneurysm (AAA) screening. You may need this if you are a current or former smoker.  Osteoporosis. You may be screened starting at age 8 if you are at high risk. Talk with your health care provider about your test results, treatment options, and if necessary, the need for more tests. Vaccines  Your health care provider may recommend certain vaccines, such as:  Influenza vaccine. This is recommended every year.  Tetanus, diphtheria, and acellular pertussis (Tdap, Td) vaccine.  You may need a Td booster every 10 years.  Zoster vaccine. You may need this after age 37.  Pneumococcal 13-valent conjugate (PCV13) vaccine. One dose is  recommended after age 77.  Pneumococcal polysaccharide (PPSV23) vaccine. One dose is recommended after age 10. Talk to your health care provider about which screenings and vaccines you need and how often you need them. This information is not intended to replace advice given to you by your health care provider. Make sure you discuss any questions you have with your health care provider. Document Released: 12/06/2015 Document Revised: 07/29/2016 Document Reviewed: 09/10/2015 Elsevier Interactive Patient Education  2017 [place] Prevention in the Home Falls can cause injuries. They can happen to people of all ages. There are many things you can do to make your home safe and to help prevent falls. What can I do on the outside of my home?  Regularly fix the edges of walkways and driveways and fix any cracks.  Remove anything that might make you trip as you walk through a door, such as a raised step or threshold.  Trim any bushes or trees on the path to your home.  Use bright outdoor lighting.  Clear any walking paths of anything that might make someone trip, such as rocks or tools.  Regularly check to see if handrails are loose or broken. Make sure that both sides of any steps have handrails.  Any raised decks and porches should have guardrails on the edges.  Have any leaves, snow, or ice cleared regularly.  Use sand or salt on walking paths during winter.  Clean up any spills in your garage right away. This includes oil or grease spills. What can I do in the bathroom?  Use night lights.  Install grab bars by the toilet and in the tub and shower. Do not use towel bars as grab bars.  Use non-skid mats or decals in the tub or shower.  If you need to sit down in the shower, use a plastic, non-slip stool.  Keep the floor dry. Clean up any water that spills on the floor as soon as it happens.  Remove soap buildup in the tub or shower regularly.  Attach bath mats  securely with double-sided non-slip rug tape.  Do not have throw rugs and other things on the floor that can make you trip. What can I do in the bedroom?  Use night lights.  Make sure that you have a light by your bed that is easy to reach.  Do not use any sheets or blankets that are too big for your bed. They should not hang down onto the floor.  Have a firm chair that has side arms. You can use this for support while you get dressed.  Do not have throw rugs and other things on the floor that can make you trip. What can I do in the kitchen?  Clean up any spills right away.  Avoid walking on wet floors.  Keep items that you use a lot in easy-to-reach places.  If you need to reach something above you, use a strong step stool that has a grab bar.  Keep electrical cords out of the way.  Do not use floor polish or wax that makes floors slippery. If you must use wax, use non-skid floor wax.  Do not have throw rugs and other things on the floor that can make you trip. What can I do with my stairs?  Do not leave any  items on the stairs.  Make sure that there are handrails on both sides of the stairs and use them. Fix handrails that are broken or loose. Make sure that handrails are as long as the stairways.  Check any carpeting to make sure that it is firmly attached to the stairs. Fix any carpet that is loose or worn.  Avoid having throw rugs at the top or bottom of the stairs. If you do have throw rugs, attach them to the floor with carpet tape.  Make sure that you have a light switch at the top of the stairs and the bottom of the stairs. If you do not have them, ask someone to add them for you. What else can I do to help prevent falls?  Wear shoes that:  Do not have high heels.  Have rubber bottoms.  Are comfortable and fit you well.  Are closed at the toe. Do not wear sandals.  If you use a stepladder:  Make sure that it is fully opened. Do not climb a closed  stepladder.  Make sure that both sides of the stepladder are locked into place.  Ask someone to hold it for you, if possible.  Clearly mark and make sure that you can see:  Any grab bars or handrails.  First and last steps.  Where the edge of each step is.  Use tools that help you move around (mobility aids) if they are needed. These include:  Canes.  Walkers.  Scooters.  Crutches.  Turn on the lights when you go into a dark area. Replace any light bulbs as soon as they burn out.  Set up your furniture so you have a clear path. Avoid moving your furniture around.  If any of your floors are uneven, fix them.  If there are any pets around you, be aware of where they are.  Review your medicines with your doctor. Some medicines can make you feel dizzy. This can increase your chance of falling. Ask your doctor what other things that you can do to help prevent falls. This information is not intended to replace advice given to you by your health care provider. Make sure you discuss any questions you have with your health care provider. Document Released: 09/05/2009 Document Revised: 04/16/2016 Document Reviewed: 12/14/2014 Elsevier Interactive Patient Education  2017 [employer].

## 2021-02-07 NOTE — Progress Notes (Signed)
I connected with Christopher Huang today by telephone and verified that I am speaking with the correct person using two identifiers. Location patient: home Location provider: work Persons participating in the virtual visit: Christopher Huang, [name].   I discussed the limitations, risks, security and privacy concerns of performing an evaluation and management service by telephone and the availability of in person appointments. I also discussed with the patient that there may be a patient responsible charge related to this service. The patient expressed understanding and verbally consented to this telephonic visit.    Interactive audio and video telecommunications were attempted between this provider and patient, however failed, due to patient having technical difficulties OR patient did not have access to video capability.  We continued and completed visit with audio only.     Vital signs may be patient reported or missing.  Subjective:   Christopher Huang is a 70 y.o. male who presents for Medicare Annual/Subsequent preventive examination.  Review of Systems     Cardiac Risk Factors include: advanced age (>4men, >67 women);dyslipidemia;hypertension;male gender;sedentary lifestyle     Objective:    Today's Vitals   02/07/21 1511 02/07/21 1512  Weight: 220 lb (99.8 kg)   Height: 6\' 1"  (1.854 m)   PainSc:  3    Body mass index is 29.03 kg/m.  Advanced Directives 02/07/2021 01/29/2021 12/03/2020 02/09/2020 01/31/2020 01/29/2020 01/26/2019  Does Patient Have a Medical Advance Directive? Yes Yes Yes Yes Yes Yes Yes  Type of Paramedic of [place];Living will Healthcare Power of [place] Living will [place];Living will - Living will;Healthcare Power of [place];Living will  Does patient want to make changes to medical advance directive? - No - Patient declined - No - Patient declined - - -  Copy of [place] in Chart? Yes - validated most recent copy scanned in chart (See row information) Yes - validated most recent copy scanned in chart (See row information) - No - copy requested - No - copy requested No - copy requested  Would patient like information on creating a medical advance directive? - - - - - - -    Current Medications (verified) Outpatient Encounter Medications as of 02/07/2021  Medication Sig  . acetaminophen (TYLENOL) 500 MG tablet Take 2 tablets (1,000 mg total) by mouth every 6 (six) hours. (Patient taking differently: Take 1,000 mg by mouth every 6 (six) hours as needed for moderate pain.)  . ASPIRIN 81 PO Take 81 mg by mouth daily.  . celecoxib (CELEBREX) 100 MG capsule Take 1 capsule (100 mg total) by mouth 2 (two) times daily.  . Cholecalciferol (VITAMIN D3) 50 MCG (2000 UT) TABS Take 2,000 Units by mouth daily.  . Coenzyme Q10 100 MG TABS Take 100 mg by mouth daily.  . [employer] MELATONIN 10 MG CAPS TAKE 1 CAPSULE BY MOUTH AT BEDTIME (Patient taking differently: Take 10 mg by mouth at bedtime.)  . fluticasone (FLONASE) 50 MCG/ACT nasal spray PLACE 2 SPRAYS INTO EACH NOSTRIL DAILY  . gabapentin (NEURONTIN) 300 MG capsule Take 300 mg by mouth 2 (two) times daily.  Marland Kitchen lisinopril (ZESTRIL) 10 MG tablet Take 1 tablet (10 mg total) by mouth every Monday, Tuesday, Wednesday, Thursday, and Friday.  . methocarbamol (ROBAXIN) 500 MG tablet Take 1 tablet (500 mg total) by mouth every 6 (six) hours as needed for muscle spasms.  . Multiple Vitamin (MULTIVITAMIN WITH MINERALS) TABS tablet Take 1 tablet by mouth  daily. Men's Multivitamin 50+  . oxyCODONE 10 MG TABS Take 1 tablet (10 mg total) by mouth every 3 (three) hours as needed for severe pain ((score 7 to 10)).  Marland Kitchen rosuvastatin (CRESTOR) 20 MG tablet Take one tablet by mouth on Monday, Wednesday, and Friday. (Patient taking differently: Take one tablet by mouth on Monday- Friday)  . Glucosamine HCl (GLUCOSAMINE PO) Take 1,000 mg by mouth  daily.  Marland Kitchen zinc gluconate 50 MG tablet Take 50 mg by mouth daily.   No facility-administered encounter medications on file as of 02/07/2021.    Allergies (verified) Percocet [oxycodone-acetaminophen], Tramadol, and Vicodin [hydrocodone-acetaminophen]   History: Past Medical History:  Diagnosis Date  . Allergic rhinitis   . Cancer of prostate ([place]) 4/08   s/p surgery  . History of kidney stones    H/O  . Hyperlipidemia   . Hypertension   . Personal history of kidney stones    Past Surgical History:  Procedure Laterality Date  . ANTERIOR LATERAL LUMBAR FUSION WITH PERCUTANEOUS SCREW 1 LEVEL N/A 01/29/2021   Procedure: L4-5 LATERAL INTERBODY FUSION, L4-5 POSTERIOR FUSION;  Surgeon: Meade Maw, MD;  Location: [facility] ORS;  Service: Neurosurgery;  Laterality: N/A;  . basal skin cancers    . COLONOSCOPY WITH PROPOFOL N/A 11/09/2017   Procedure: COLONOSCOPY WITH PROPOFOL;  Surgeon: Lucilla Lame, MD;  Location: [facility] ENDOSCOPY;  Service: Endoscopy;  Laterality: N/A;  . EYE SURGERY Bilateral    Cataract  . HAND SURGERY     THUMB SURGERY  . KNEE ARTHROSCOPY    . prostate cancer removed    . TONSILLECTOMY    . TONSILLECTOMY    . TOTAL KNEE ARTHROPLASTY Left 02/09/2020   Procedure: TOTAL KNEE ARTHROPLASTY - RNFA;  Surgeon: Hessie Knows, MD;  Location: [facility] ORS;  Service: Orthopedics;  Laterality: Left;   Family History  Problem Relation Age of Onset  . Dementia Mother   . Prostate cancer Father    Social History   Socioeconomic History  . Marital status: Married    Spouse name: Not on file  . Number of children: Not on file  . Years of education: Not on file  . Highest education level: High school graduate  Occupational History  . Occupation: retired   Tobacco Use  . Smoking status: Never Smoker  . Smokeless tobacco: Never Used  Vaping Use  . Vaping Use: Never used  Substance and Sexual Activity  . Alcohol use: No  . Drug use: No  . Sexual activity: Yes  Other  Topics Concern  . Not on file  Social History Narrative   Goes fishing and hunting    Meets with friends every morning for breakfast    Church    Social Determinants of Health   Financial Resource Strain: Low Risk   . Difficulty of Paying Living Expenses: Not hard at all  Food Insecurity: No Food Insecurity  . Worried About Charity fundraiser in the Last Year: Never true  . Ran Out of Food in the Last Year: Never true  Transportation Needs: No Transportation Needs  . Lack of Transportation (Medical): No  . Lack of Transportation (Non-Medical): No  Physical Activity: Inactive  . Days of Exercise per Week: 0 days  . Minutes of Exercise per Session: 0 min  Stress: No Stress Concern Present  . Feeling of Stress : Not at all  Social Connections: Not on file    Tobacco Counseling Counseling given: Not Answered   Clinical Intake:  Pre-visit  preparation completed: Yes  Pain : 0-10 Pain Score: 3  Pain Type: Acute pain Pain Location: Hip Pain Orientation: Right Pain Descriptors / Indicators: Shooting Pain Frequency: Constant     Nutritional Status: BMI 25 -29 Overweight Nutritional Risks: None Diabetes: No  How often do you need to have someone help you when you read instructions, pamphlets, or other written materials from your doctor or pharmacy?: 1 - Never What is the last grade level you completed in school?: 12th grade  Diabetic? no  Interpreter Needed?: No  Information entered by :: NAllen LPN   Activities of Daily Living In your present state of health, do you have any difficulty performing the following activities: 02/07/2021 01/29/2021  Hearing? N -  Vision? N -  Comment - -  Difficulty concentrating or making decisions? N -  Walking or climbing stairs? N -  Dressing or bathing? N -  Doing errands, shopping? Y N  Comment not driving at this time Facilities manager and eating ? N -  Using the Toilet? N -  In the past six months, have you accidently  leaked urine? N -  Do you have problems with loss of bowel control? N -  Managing your Medications? N -  Managing your Finances? N -  Housekeeping or managing your Housekeeping? Y -  Comment just had back surgery -  Some recent data might be hidden    Patient Care Team: Venita Lick, NP as PCP - General (Nurse Practitioner) Roseanne Kaufman, MD as Consulting Physician (Orthopedic Surgery) Lucilla Lame, MD as Consulting Physician (Gastroenterology)  Indicate any recent Medical Services you may have received from other than [facility] providers in the past year (date may be approximate).     Assessment:   This is a routine wellness examination for [employer]  Hearing/Vision screen  Hearing Screening   125Hz  250Hz  500Hz  1000Hz  2000Hz  3000Hz  4000Hz  6000Hz  8000Hz   Right ear:           Left ear:           Vision Screening Comments: Regular eye exams, Dr. Ellin Mayhew  Dietary issues and exercise activities discussed: Current Exercise Habits: The patient does not participate in regular exercise at present  Goals    . DIET - INCREASE WATER INTAKE     Recommend drinking at least 6-8 glasses of water a day     . Patient Stated     02/07/2021, wants to be more active and lose 20 pounds      Depression Screen PHQ 2/9 Scores 02/07/2021 11/26/2020 01/29/2020 01/26/2019 02/15/2018 11/12/2017 07/15/2017  PHQ - 2 Score 0 2 0 0 0 0 0  PHQ- 9 Score - 2 - - 0 - -    Fall Risk Fall Risk  02/07/2021 11/26/2020 01/29/2020 08/04/2019 01/26/2019  Falls in the past year? 0 0 0 0 0  Number falls in past yr: - 0 0 0 -  Injury with Fall? - - 0 0 -  Risk for fall due to : Medication side effect - - - -  Follow up Falls evaluation completed;Education provided;Falls prevention discussed - - - -    FALL RISK PREVENTION PERTAINING TO THE HOME:  Any stairs in or around the home? Yes  If so, are there any without handrails? No  Home free of loose throw rugs in walkways, pet beds, electrical cords, etc? Yes  Adequate  lighting in your home to reduce risk of falls? Yes   ASSISTIVE DEVICES UTILIZED TO  PREVENT FALLS:  Life alert? No  Use of a cane, walker or w/c? No  Grab bars in the bathroom? Yes  Shower chair or bench in shower? Yes  Elevated toilet seat or a handicapped toilet? Yes   TIMED UP AND GO:  Was the test performed? No .     Cognitive Function:     6CIT Screen 02/07/2021 01/26/2019 11/12/2017  What Year? 0 points 0 points 0 points  What month? 0 points 0 points 0 points  What time? 0 points 0 points 0 points  Count back from 20 0 points 0 points 0 points  Months in reverse 2 points 0 points 0 points  Repeat phrase 0 points 0 points 0 points  Total Score 2 0 0    Immunizations Immunization History  Administered Date(s) Administered  . Fluad Quad(high Dose 65+) 08/04/2019, 08/16/2020  . Influenza, High Dose Seasonal PF 08/11/2018  . Influenza-Unspecified 11/06/2014, 09/06/2015  . PFIZER(Purple Top)SARS-COV-2 Vaccination 01/03/2020, 01/24/2020, 09/12/2020  . Pneumococcal Conjugate-13 07/07/2016  . Pneumococcal Polysaccharide-23 11/12/2017  . Td 11/28/2015    TDAP status: Up to date  Flu Vaccine status: Up to date  Pneumococcal vaccine status: Up to date  Covid-19 vaccine status: Completed vaccines  Qualifies for Shingles Vaccine? Yes   Zostavax completed Yes   Shingrix Completed?: No.    Education has been provided regarding the importance of this vaccine. Patient has been advised to call insurance company to determine out of pocket expense if they have not yet received this vaccine. Advised may also receive vaccine at local pharmacy or Health Dept. Verbalized acceptance and understanding.  Screening Tests Health Maintenance  Topic Date Due  . COVID-19 Vaccine (4 - Booster for Pfizer series) 03/13/2021  . COLONOSCOPY (Pts 45-13yrs Insurance coverage will need to be confirmed)  11/09/2022  . TETANUS/TDAP  11/27/2025  . INFLUENZA VACCINE  Completed  . Hepatitis C  Screening  Completed  . PNA vac Low Risk Adult  Completed  . HPV VACCINES  Aged Out    Health Maintenance  There are no preventive care reminders to display for this patient.  Colorectal cancer screening: Type of screening: Colonoscopy. Completed 11/09/2017. Repeat every 5 years  Lung Cancer Screening: (Low Dose CT Chest recommended if Age 74-80 years, 30 pack-year currently smoking OR have quit w/in 15years.) does not qualify.   Lung Cancer Screening Referral: no  Additional Screening:  Hepatitis C Screening: does qualify; Completed 01/14/2017  Vision Screening: Recommended annual ophthalmology exams for early detection of glaucoma and other disorders of the eye. Is the patient up to date with their annual eye exam?  Yes  Who is the provider or what is the name of the office in which the patient attends annual eye exams? Dr. Ellin Mayhew If pt is not established with a provider, would they like to be referred to a provider to establish care? No .   Dental Screening: Recommended annual dental exams for proper oral hygiene  Community Resource Referral / Chronic Care Management: CRR required this visit?  No   CCM required this visit?  No      Plan:     I have personally reviewed and noted the following in the patient's chart:   . Medical and social history . Use of alcohol, tobacco or illicit drugs  . Current medications and supplements . Functional ability and status . Nutritional status . Physical activity . Advanced directives . List of other physicians . Hospitalizations, surgeries, and ER visits in previous  12 months . Vitals . Screenings to include cognitive, depression, and falls . Referrals and appointments  In addition, I have reviewed and discussed with patient certain preventive protocols, quality metrics, and best practice recommendations. A written personalized care plan for preventive services as well as general preventive health recommendations were provided  to patient.     Kellie Simmering, LPN   [id number]   Nurse Notes:

## 2021-02-13 ENCOUNTER — Encounter: Payer: PPO | Admitting: Nurse Practitioner

## 2021-02-24 DIAGNOSIS — Z4789 Encounter for other orthopedic aftercare: Secondary | ICD-10-CM | POA: Diagnosis not present

## 2021-02-24 DIAGNOSIS — E785 Hyperlipidemia, unspecified: Secondary | ICD-10-CM | POA: Diagnosis not present

## 2021-02-24 DIAGNOSIS — M4319 Spondylolisthesis, multiple sites in spine: Secondary | ICD-10-CM | POA: Diagnosis not present

## 2021-02-24 DIAGNOSIS — R2681 Unsteadiness on feet: Secondary | ICD-10-CM | POA: Diagnosis not present

## 2021-02-24 DIAGNOSIS — I10 Essential (primary) hypertension: Secondary | ICD-10-CM | POA: Diagnosis not present

## 2021-02-27 DIAGNOSIS — M47816 Spondylosis without myelopathy or radiculopathy, lumbar region: Secondary | ICD-10-CM | POA: Diagnosis not present

## 2021-02-27 DIAGNOSIS — M4186 Other forms of scoliosis, lumbar region: Secondary | ICD-10-CM | POA: Diagnosis not present

## 2021-02-27 DIAGNOSIS — Z981 Arthrodesis status: Secondary | ICD-10-CM | POA: Diagnosis not present

## 2021-02-27 DIAGNOSIS — M4316 Spondylolisthesis, lumbar region: Secondary | ICD-10-CM | POA: Diagnosis not present

## 2021-03-07 ENCOUNTER — Encounter: Payer: Self-pay | Admitting: Nurse Practitioner

## 2021-03-07 DIAGNOSIS — Z9889 Other specified postprocedural states: Secondary | ICD-10-CM | POA: Insufficient documentation

## 2021-03-10 ENCOUNTER — Encounter: Payer: Self-pay | Admitting: Nurse Practitioner

## 2021-03-10 ENCOUNTER — Ambulatory Visit (INDEPENDENT_AMBULATORY_CARE_PROVIDER_SITE_OTHER): Payer: PPO | Admitting: Nurse Practitioner

## 2021-03-10 ENCOUNTER — Telehealth: Payer: PPO | Admitting: Nurse Practitioner

## 2021-03-10 VITALS — Temp 99.6°F

## 2021-03-10 DIAGNOSIS — Z20822 Contact with and (suspected) exposure to covid-19: Secondary | ICD-10-CM | POA: Diagnosis not present

## 2021-03-10 MED ORDER — PREDNISONE 10 MG PO TABS: ORAL_TABLET | ORAL | 0 refills | Status: DC

## 2021-03-10 MED ORDER — BENZONATATE 100 MG PO CAPS: 100.0000 mg | ORAL_CAPSULE | Freq: Three times a day (TID) | ORAL | 0 refills | Status: DC | PRN

## 2021-03-10 NOTE — Progress Notes (Signed)
Acute Office Visit  Subjective:    Patient ID: Christopher Huang, male    DOB: [birth year], 70 y.o.   MRN: [id number]  Chief Complaint  Patient presents with  . Generalized Body Aches  . Headache  . Cough  . Sinus Problem  . Fever    Patient wife states her husband has been having the following symptoms since the weekend, but patient at home COVID test results are negative. Patient states he has a lot of sinus pressure. Patient states he also has a lot of coughing to where he is coughing up a lot of phlegm.    HPI Patient is in today for fever, boy aches, and cough that started Thursday 03/06/21. Has taken three covid-19 tests, one a day since Friday, that were negative. His wife tested positive for covid-19 on 03/07/21.   UPPER RESPIRATORY TRACT INFECTION  Worst symptom: cough, sinus pressure Fever: yes Cough: yes Shortness of breath: no Wheezing: no Chest pain: no Chest tightness: no Chest congestion: yes Nasal congestion: yes Runny nose: yes Post nasal drip: yes Sneezing: yes Sore throat: no Swollen glands: no Sinus pressure: yes Headache: yes Face pain: yes Toothache: no Ear pain: no  Ear pressure: no  Eyes red/itching:no Eye drainage/crusting: no  Vomiting: no Rash: no Fatigue: yes Sick contacts: no Strep contacts: no  Context: stable Recurrent sinusitis: no Relief with OTC cold/cough medications: yes  Treatments attempted: mucinex,  tylenol   Past Medical History:  Diagnosis Date  . Allergic rhinitis   . Cancer of prostate ([place]) 4/08   s/p surgery  . History of kidney stones    H/O  . Hyperlipidemia   . Hypertension   . Personal history of kidney stones     Past Surgical History:  Procedure Laterality Date  . ANTERIOR LATERAL LUMBAR FUSION WITH PERCUTANEOUS SCREW 1 LEVEL N/A 01/29/2021   Procedure: L4-5 LATERAL INTERBODY FUSION, L4-5 POSTERIOR FUSION;  Surgeon: Meade Maw, MD;  Location: [facility] ORS;  Service: Neurosurgery;  Laterality: N/A;   . basal skin cancers    . COLONOSCOPY WITH PROPOFOL N/A 11/09/2017   Procedure: COLONOSCOPY WITH PROPOFOL;  Surgeon: Lucilla Lame, MD;  Location: [facility] ENDOSCOPY;  Service: Endoscopy;  Laterality: N/A;  . EYE SURGERY Bilateral    Cataract  . HAND SURGERY     THUMB SURGERY  . KNEE ARTHROSCOPY    . prostate cancer removed    . TONSILLECTOMY    . TONSILLECTOMY    . TOTAL KNEE ARTHROPLASTY Left 02/09/2020   Procedure: TOTAL KNEE ARTHROPLASTY - RNFA;  Surgeon: Hessie Knows, MD;  Location: [facility] ORS;  Service: Orthopedics;  Laterality: Left;    Family History  Problem Relation Age of Onset  . Dementia Mother   . Prostate cancer Father     Social History   Socioeconomic History  . Marital status: Married    Spouse name: Not on file  . Number of children: Not on file  . Years of education: Not on file  . Highest education level: High school graduate  Occupational History  . Occupation: retired   Tobacco Use  . Smoking status: Never Smoker  . Smokeless tobacco: Never Used  Vaping Use  . Vaping Use: Never used  Substance and Sexual Activity  . Alcohol use: No  . Drug use: No  . Sexual activity: Yes  Other Topics Concern  . Not on file  Social History Narrative   Goes fishing and hunting    Meets with friends every morning  for breakfast    [employer]    Social Determinants of Health   Financial Resource Strain: Low Risk   . Difficulty of Paying Living Expenses: Not hard at all  Food Insecurity: No Food Insecurity  . Worried About Charity fundraiser in the Last Year: Never true  . Ran Out of Food in the Last Year: Never true  Transportation Needs: No Transportation Needs  . Lack of Transportation (Medical): No  . Lack of Transportation (Non-Medical): No  Physical Activity: Inactive  . Days of Exercise per Week: 0 days  . Minutes of Exercise per Session: 0 min  Stress: No Stress Concern Present  . Feeling of Stress : Not at all  Social Connections: Not on file  Intimate  Partner Violence: Not on file    Outpatient Medications Prior to Visit  Medication Sig Dispense Refill  . acetaminophen (TYLENOL) 500 MG tablet Take 2 tablets (1,000 mg total) by mouth every 6 (six) hours. (Patient taking differently: Take 1,000 mg by mouth every 6 (six) hours as needed for moderate pain.) 60 tablet 1  . ASPIRIN 81 PO Take 81 mg by mouth daily.    . Cholecalciferol (VITAMIN D3) 50 MCG (2000 UT) TABS Take 2,000 Units by mouth daily.    . Coenzyme Q10 100 MG TABS Take 100 mg by mouth daily.    . [employer] MELATONIN 10 MG CAPS TAKE 1 CAPSULE BY MOUTH AT BEDTIME (Patient taking differently: Take 10 mg by mouth at bedtime.) 90 capsule 1  . fluticasone (FLONASE) 50 MCG/ACT nasal spray PLACE 2 SPRAYS INTO EACH NOSTRIL DAILY 48 g 3  . gabapentin (NEURONTIN) 300 MG capsule Take 300 mg by mouth 2 (two) times daily.    Marland Kitchen lisinopril (ZESTRIL) 10 MG tablet Take 1 tablet (10 mg total) by mouth every Monday, Tuesday, Wednesday, Thursday, and Friday. 90 tablet 3  . Multiple Vitamin (MULTIVITAMIN WITH MINERALS) TABS tablet Take 1 tablet by mouth daily. Men's Multivitamin 50+    . rosuvastatin (CRESTOR) 20 MG tablet Take one tablet by mouth on Monday, Wednesday, and Friday. (Patient taking differently: Take one tablet by mouth on Monday- Friday) 48 tablet 3  . Glucosamine HCl (GLUCOSAMINE PO) Take 1,000 mg by mouth daily. (Patient not taking: Reported on 03/10/2021)    . methocarbamol (ROBAXIN) 500 MG tablet Take 1 tablet (500 mg total) by mouth every 6 (six) hours as needed for muscle spasms. (Patient not taking: Reported on 03/10/2021) 90 tablet 0  . oxyCODONE 10 MG TABS Take 1 tablet (10 mg total) by mouth every 3 (three) hours as needed for severe pain ((score 7 to 10)). (Patient not taking: Reported on 03/10/2021) 30 tablet 0  . zinc gluconate 50 MG tablet Take 50 mg by mouth daily. (Patient not taking: Reported on 03/10/2021)     No facility-administered medications prior to visit.    Allergies   Allergen Reactions  . Percocet [Oxycodone-Acetaminophen] Nausea And Vomiting  . Tramadol Nausea And Vomiting  . Vicodin [Hydrocodone-Acetaminophen] Nausea And Vomiting    Review of Systems  Constitutional: Positive for fatigue and fever.  HENT: Positive for congestion, rhinorrhea, sinus pressure and sneezing. Negative for ear discharge, ear pain, postnasal drip and sore throat.   Eyes: Negative.   Respiratory: Positive for cough. Negative for shortness of breath.   Cardiovascular: Negative.   Gastrointestinal: Positive for diarrhea. Negative for abdominal pain.  Genitourinary: Negative.   Musculoskeletal: Positive for myalgias and neck pain.  Skin: Negative.   Neurological: Negative.  Psychiatric/Behavioral: Negative.        Objective:    Physical Exam Vitals and nursing note reviewed.  Pulmonary:     Effort: Pulmonary effort is normal.     Comments: Able to talk in complete sentences Neurological:     Mental Status: He is alert and oriented to person, place, and time.     Temp 99.6 F (37.6 C) (Oral)  Wt Readings from Last 3 Encounters:  02/07/21 220 lb (99.8 kg)  01/29/21 231 lb (104.8 kg)  12/03/20 231 lb (104.8 kg)    Health Maintenance Due  Topic Date Due  . COVID-19 Vaccine (4 - Booster for Pfizer series) 03/13/2021    There are no preventive care reminders to display for this patient.   Lab Results  Component Value Date   TSH 2.300 01/26/2019   Lab Results  Component Value Date   WBC 6.1 12/03/2020   HGB 16.0 12/03/2020   HCT 45.9 12/03/2020   MCV 90.2 12/03/2020   PLT 252 12/03/2020   Lab Results  Component Value Date   NA 137 12/03/2020   K 3.8 12/03/2020   CO2 25 12/03/2020   GLUCOSE 101 (H) 12/03/2020   BUN 17 12/03/2020   CREATININE 0.77 12/03/2020   BILITOT 0.5 08/16/2020   ALKPHOS 98 08/16/2020   AST 19 08/16/2020   ALT 20 08/16/2020   PROT 6.3 08/16/2020   ALBUMIN 4.2 08/16/2020   CALCIUM 9.2 12/03/2020   ANIONGAP 8  12/03/2020   Lab Results  Component Value Date   CHOL 209 (H) 08/16/2020   Lab Results  Component Value Date   HDL 46 08/16/2020   Lab Results  Component Value Date   LDLCALC 122 (H) 08/16/2020   Lab Results  Component Value Date   TRIG 232 (H) 08/16/2020   Lab Results  Component Value Date   CHOLHDL 4.4 02/15/2018   No results found for: HGBA1C     Assessment & Plan:   Problem List Items Addressed This Visit      Other   Suspected COVID-19 virus infection - Primary    He has had 3 negative rapid covid-19 tests at home, however his wife tested positive on 03/07/21. Has had 3 covid-19 vaccines. He does not want a referral for outpatient covid-19 treatment so will assume that he has covid-19 and treat symptomatically. Prescription sent for prednisone taper and tessalon prn. Continue alternating ibuprofen and tylenol as needed for fevers and body aches. Also continue mucinex. Discussed isolating for 10 days since symptoms started. Will schedule for a lung check in 2 weeks or call sooner if symptoms worsen. Discussed ER precautions.           Meds ordered this encounter  Medications  . benzonatate (TESSALON) 100 MG capsule    Sig: Take 1 capsule (100 mg total) by mouth 3 (three) times daily as needed for cough.    Dispense:  20 capsule    Refill:  0  . predniSONE (DELTASONE) 10 MG tablet    Sig: Take 6 tablets today, then 5 tablets tomorrow, then decrease by 1 tablet every day until gone    Dispense:  21 tablet    Refill:  0    . This visit was completed via telephone due to the restrictions of the COVID-19 pandemic. All issues as above were discussed and addressed but no physical exam was performed. If it was felt that the patient should be evaluated in the office, they were directed there. The  patient verbally consented to this visit. Patient was unable to complete an audio/visual visit due to Technical difficulties, Lack of internet. Due to the catastrophic nature of  the COVID-19 pandemic, this visit was done through audio contact only. . Location of the patient: home . Location of the provider: work . Those involved with this call:  . Provider: Billy Fischer, DNP . CMA: Irena Reichmann, CMA . Front Desk/Registration: Jill Side  . Time spent on call: 15 minutes on the phone discussing health concerns. 15 minutes total spent in review of patient's record and preparation of their chart.    Charyl Dancer, NP

## 2021-03-10 NOTE — Assessment & Plan Note (Addendum)
He has had 3 negative rapid covid-19 tests at home, however his wife tested positive on 03/07/21. Has had 3 covid-19 vaccines. He does not want a referral for outpatient covid-19 treatment so will assume that he has covid-19 and treat symptomatically. Prescription sent for prednisone taper and tessalon prn. Continue alternating ibuprofen and tylenol as needed for fevers and body aches. Also continue mucinex. Discussed isolating for 10 days since symptoms started. Will schedule for a lung check in 2 weeks or call sooner if symptoms worsen. Discussed ER precautions.

## 2021-03-10 NOTE — Patient Instructions (Signed)

## 2021-03-11 ENCOUNTER — Telehealth: Payer: Self-pay

## 2021-03-11 NOTE — Telephone Encounter (Signed)
-----   Message from Charyl Dancer, NP sent at 03/10/2021 10:52 AM EDT ----- Needs to reschedule his physical on Thursday for at least 2 more weeks. Also needs appointment for lung check in 2 weeks, which could be at the same time as physical

## 2021-03-11 NOTE — Telephone Encounter (Signed)
LVM TO MAKE THESE APT PER INSURANCE CAN NOT DO LUNG F.U AND PHYSICAL SAME TIME OR INS MAY CHARGE HIM FOR PHYSICAL

## 2021-03-13 ENCOUNTER — Encounter: Payer: PPO | Admitting: Nurse Practitioner

## 2021-03-24 ENCOUNTER — Encounter: Payer: Self-pay | Admitting: Nurse Practitioner

## 2021-03-24 ENCOUNTER — Other Ambulatory Visit: Payer: Self-pay

## 2021-03-24 ENCOUNTER — Ambulatory Visit (INDEPENDENT_AMBULATORY_CARE_PROVIDER_SITE_OTHER): Payer: PPO | Admitting: Nurse Practitioner

## 2021-03-24 VITALS — BP 128/72 | HR 97 | Temp 98.2°F | Wt 233.8 lb

## 2021-03-24 DIAGNOSIS — Z8546 Personal history of malignant neoplasm of prostate: Secondary | ICD-10-CM

## 2021-03-24 DIAGNOSIS — I10 Essential (primary) hypertension: Secondary | ICD-10-CM

## 2021-03-24 DIAGNOSIS — Z20822 Contact with and (suspected) exposure to covid-19: Secondary | ICD-10-CM

## 2021-03-24 DIAGNOSIS — E669 Obesity, unspecified: Secondary | ICD-10-CM | POA: Diagnosis not present

## 2021-03-24 DIAGNOSIS — E785 Hyperlipidemia, unspecified: Secondary | ICD-10-CM | POA: Diagnosis not present

## 2021-03-24 DIAGNOSIS — Z0000 Encounter for general adult medical examination without abnormal findings: Secondary | ICD-10-CM | POA: Diagnosis not present

## 2021-03-24 NOTE — Assessment & Plan Note (Signed)
Chronic, stable. BP 128/72 today. Continue current regimen. Check CMP, CBC. Follow-up in 6 months.

## 2021-03-24 NOTE — Assessment & Plan Note (Signed)
Chronic, stable. Continue current regimen. Check lipid panel. Follow-up in 6 months.

## 2021-03-24 NOTE — Assessment & Plan Note (Signed)
BMI 30.85 today. Stated he has gained 20 pounds recently after back surgery. He is starting to be more active again. Discussed diet. Goal is to lose 1-2 pounds per week.

## 2021-03-24 NOTE — Patient Instructions (Signed)
Health Maintenance After Age 71 After age 71, you are at a higher risk for certain long-term diseases and infections as well as injuries from falls. Falls are a major cause of broken bones and head injuries in people who are older than age 71. Getting regular preventive care can help to keep you healthy and well. Preventive care includes getting regular testing and making lifestyle changes as recommended by your health care provider. Talk with your health care provider about:  Which screenings and tests you should have. A screening is a test that checks for a disease when you have no symptoms.  A diet and exercise plan that is right for you. What should I know about screenings and tests to prevent falls? Screening and testing are the best ways to find a health problem early. Early diagnosis and treatment give you the best chance of managing medical conditions that are common after age 71. Certain conditions and lifestyle choices may make you more likely to have a fall. Your health care provider may recommend:  Regular vision checks. Poor vision and conditions such as cataracts can make you more likely to have a fall. If you wear glasses, make sure to get your prescription updated if your vision changes.  Medicine review. Work with your health care provider to regularly review all of the medicines you are taking, including over-the-counter medicines. Ask your health care provider about any side effects that may make you more likely to have a fall. Tell your health care provider if any medicines that you take make you feel dizzy or sleepy.  Osteoporosis screening. Osteoporosis is a condition that causes the bones to get weaker. This can make the bones weak and cause them to break more easily.  Blood pressure screening. Blood pressure changes and medicines to control blood pressure can make you feel dizzy.  Strength and balance checks. Your health care provider may recommend certain tests to check your  strength and balance while standing, walking, or changing positions.  Foot health exam. Foot pain and numbness, as well as not wearing proper footwear, can make you more likely to have a fall.  Depression screening. You may be more likely to have a fall if you have a fear of falling, feel emotionally low, or feel unable to do activities that you used to do.  Alcohol use screening. Using too much alcohol can affect your balance and may make you more likely to have a fall. What actions can I take to lower my risk of falls? General instructions  Talk with your health care provider about your risks for falling. Tell your health care provider if: ? You fall. Be sure to tell your health care provider about all falls, even ones that seem minor. ? You feel dizzy, sleepy, or off-balance.  Take over-the-counter and prescription medicines only as told by your health care provider. These include any supplements.  Eat a healthy diet and maintain a healthy weight. A healthy diet includes low-fat dairy products, low-fat (lean) meats, and fiber from whole grains, beans, and lots of fruits and vegetables. Home safety  Remove any tripping hazards, such as rugs, cords, and clutter.  Install safety equipment such as grab bars in bathrooms and safety rails on stairs.  Keep rooms and walkways well-lit. Activity  Follow a regular exercise program to stay fit. This will help you maintain your balance. Ask your health care provider what types of exercise are appropriate for you.  If you need a cane or walker,   use it as recommended by your health care provider.  Wear supportive shoes that have nonskid soles.   Lifestyle  Do not drink alcohol if your health care provider tells you not to drink.  If you drink alcohol, limit how much you have: ? 0-1 drink a day for women. ? 0-2 drinks a day for men.  Be aware of how much alcohol is in your drink. In the [place] one drink equals one typical bottle of beer (12  oz), one-half glass of wine (5 oz), or one shot of hard liquor (1 oz).  Do not use any products that contain nicotine or tobacco, such as cigarettes and e-cigarettes. If you need help quitting, ask your health care provider. Summary  Having a healthy lifestyle and getting preventive care can help to protect your health and wellness after age 71.  Screening and testing are the best way to find a health problem early and help you avoid having a fall. Early diagnosis and treatment give you the best chance for managing medical conditions that are more common for people who are older than age 71.  Falls are a major cause of broken bones and head injuries in people who are older than age 71. Take precautions to prevent a fall at home.  Work with your health care provider to learn what changes you can make to improve your health and wellness and to prevent falls. This information is not intended to replace advice given to you by your health care provider. Make sure you discuss any questions you have with your health care provider. Document Revised: 03/02/2019 Document Reviewed: 09/22/2017 Elsevier Patient Education  2021 [employer]  

## 2021-03-24 NOTE — Assessment & Plan Note (Signed)
No recurring symptoms. Total prostatectomy >10 years ago. Check PSA today.

## 2021-03-24 NOTE — Progress Notes (Signed)
Established Patient Office Visit  Subjective:  Patient ID: Christopher Huang, male    DOB: [birth year]  Age: 70 y.o. MRN: [id number]  CC:  Chief Complaint  Patient presents with  . Cough    Follow up.   . Annual Exam    HPI Christopher Huang presents for follow-up with cough after suspected covid-19. This cough and all of his symptoms have resolved. No new concerns. He currently lives with his wife. He denies any recent history of falls. Enjoys fishing and working in the yard.   HYPERTENSION / HYPERLIPIDEMIA  Satisfied with current treatment? yes Duration of hypertension: chronic BP monitoring frequency: daily BP range: 120s/70s BP medication side effects: no Past BP meds: lisinopril Duration of hyperlipidemia: chronic Cholesterol medication side effects: no Cholesterol supplements: none Past cholesterol medications: rosuvastatin (crestor) Medication compliance: excellent compliance Aspirin: yes Recent stressors: no Recurrent headaches: no Visual changes: no Palpitations: no Dyspnea: no Chest pain: no Lower extremity edema: no Dizzy/lightheaded: no   Depression screen [facility] 2/9 03/24/2021 02/07/2021 11/26/2020 01/29/2020 01/26/2019  Decreased Interest 0 0 1 0 0  Down, Depressed, Hopeless 0 0 1 0 0  PHQ - 2 Score 0 0 2 0 0  Altered sleeping - - 0 - -  Tired, decreased energy - - 0 - -  Change in appetite - - 0 - -  Feeling bad or failure about yourself  - - 0 - -  Trouble concentrating - - 0 - -  Moving slowly or fidgety/restless - - 0 - -  Suicidal thoughts - - 0 - -  PHQ-9 Score - - 2 - -     Past Medical History:  Diagnosis Date  . Allergic rhinitis   . Cancer of prostate (HCC) 4/08   s/p surgery  . History of kidney stones    H/O  . Hyperlipidemia   . Hypertension   . Personal history of kidney stones     Past Surgical History:  Procedure Laterality Date  . ANTERIOR LATERAL LUMBAR FUSION WITH PERCUTANEOUS SCREW 1 LEVEL N/A 01/29/2021   Procedure: L4-5 LATERAL  INTERBODY FUSION, L4-5 POSTERIOR FUSION;  Surgeon: Venetia Night, MD;  Location: [facility] ORS;  Service: Neurosurgery;  Laterality: N/A;  . basal skin cancers    . CATARACT EXTRACTION, BILATERAL     2021  . COLONOSCOPY WITH PROPOFOL N/A 11/09/2017   Procedure: COLONOSCOPY WITH PROPOFOL;  Surgeon: Midge Minium, MD;  Location: [facility] ENDOSCOPY;  Service: Endoscopy;  Laterality: N/A;  . EYE SURGERY Bilateral    Cataract  . HAND SURGERY     THUMB SURGERY  . KNEE ARTHROSCOPY    . prostate cancer removed    . TONSILLECTOMY    . TONSILLECTOMY    . TOTAL KNEE ARTHROPLASTY Left 02/09/2020   Procedure: TOTAL KNEE ARTHROPLASTY - RNFA;  Surgeon: Kennedy Bucker, MD;  Location: [facility] ORS;  Service: Orthopedics;  Laterality: Left;    Family History  Problem Relation Age of Onset  . Dementia Mother   . Prostate cancer Father     Social History   Socioeconomic History  . Marital status: Married    Spouse name: Not on file  . Number of children: Not on file  . Years of education: Not on file  . Highest education level: High school graduate  Occupational History  . Occupation: retired   Tobacco Use  . Smoking status: Never Smoker  . Smokeless tobacco: Never Used  Vaping Use  . Vaping Use: Never used  Substance and Sexual Activity  . Alcohol use: No  . Drug use: No  . Sexual activity: Yes  Other Topics Concern  . Not on file  Social History Narrative   Goes fishing and hunting    Meets with friends every morning for breakfast    Church    Social Determinants of Health   Financial Resource Strain: Low Risk   . Difficulty of Paying Living Expenses: Not hard at all  Food Insecurity: No Food Insecurity  . Worried About Charity fundraiser in the Last Year: Never true  . Ran Out of Food in the Last Year: Never true  Transportation Needs: No Transportation Needs  . Lack of Transportation (Medical): No  . Lack of Transportation (Non-Medical): No  Physical Activity: Inactive  . Days of  Exercise per Week: 0 days  . Minutes of Exercise per Session: 0 min  Stress: No Stress Concern Present  . Feeling of Stress : Not at all  Social Connections: Not on file  Intimate Partner Violence: Not on file    Outpatient Medications Prior to Visit  Medication Sig Dispense Refill  . ASPIRIN 81 PO Take 81 mg by mouth daily.    . benzonatate (TESSALON) 100 MG capsule Take 1 capsule (100 mg total) by mouth 3 (three) times daily as needed for cough. 20 capsule 0  . Cholecalciferol (VITAMIN D3) 50 MCG (2000 UT) TABS Take 2,000 Units by mouth daily.    . Coenzyme Q10 100 MG TABS Take 100 mg by mouth daily.    . [employer] MELATONIN 10 MG CAPS TAKE 1 CAPSULE BY MOUTH AT BEDTIME (Patient taking differently: Take 10 mg by mouth at bedtime.) 90 capsule 1  . fluticasone (FLONASE) 50 MCG/ACT nasal spray PLACE 2 SPRAYS INTO EACH NOSTRIL DAILY 48 g 3  . gabapentin (NEURONTIN) 300 MG capsule Take 300 mg by mouth 2 (two) times daily.    Marland Kitchen lisinopril (ZESTRIL) 10 MG tablet Take 1 tablet (10 mg total) by mouth every Monday, Tuesday, Wednesday, Thursday, and Friday. 90 tablet 3  . Multiple Vitamin (MULTIVITAMIN WITH MINERALS) TABS tablet Take 1 tablet by mouth daily. Men's Multivitamin 50+    . rosuvastatin (CRESTOR) 20 MG tablet Take one tablet by mouth on Monday, Wednesday, and Friday. (Patient taking differently: Take one tablet by mouth on Monday- Friday) 48 tablet 3  . acetaminophen (TYLENOL) 500 MG tablet Take 2 tablets (1,000 mg total) by mouth every 6 (six) hours. (Patient taking differently: Take 1,000 mg by mouth every 6 (six) hours as needed for moderate pain.) 60 tablet 1  . Glucosamine HCl (GLUCOSAMINE PO) Take 1,000 mg by mouth daily. (Patient not taking: Reported on 03/10/2021)    . methocarbamol (ROBAXIN) 500 MG tablet Take 1 tablet (500 mg total) by mouth every 6 (six) hours as needed for muscle spasms. (Patient not taking: Reported on 03/10/2021) 90 tablet 0  . oxyCODONE 10 MG TABS Take 1 tablet (10  mg total) by mouth every 3 (three) hours as needed for severe pain ((score 7 to 10)). (Patient not taking: Reported on 03/10/2021) 30 tablet 0  . predniSONE (DELTASONE) 10 MG tablet Take 6 tablets today, then 5 tablets tomorrow, then decrease by 1 tablet every day until gone (Patient not taking: Reported on 03/24/2021) 21 tablet 0  . zinc gluconate 50 MG tablet Take 50 mg by mouth daily. (Patient not taking: Reported on 03/10/2021)     No facility-administered medications prior to visit.    Allergies  Allergen Reactions  . Percocet [Oxycodone-Acetaminophen] Nausea And Vomiting  . Tramadol Nausea And Vomiting  . Vicodin [Hydrocodone-Acetaminophen] Nausea And Vomiting    ROS Review of Systems  Constitutional: Negative.   HENT: Negative.   Eyes: Negative.   Respiratory: Negative.   Cardiovascular: Negative.   Gastrointestinal: Negative.   Endocrine: Negative.   Genitourinary: Negative.   Musculoskeletal: Positive for back pain (controlled, had back surgery a few months ago).  Skin: Negative.   Allergic/Immunologic: Positive for environmental allergies.  Neurological: Negative.   Psychiatric/Behavioral: Negative.       Objective:    Physical Exam Vitals and nursing note reviewed.  Constitutional:      Appearance: Normal appearance.  HENT:     Head: Normocephalic and atraumatic.     Right Ear: Tympanic membrane, ear canal and external ear normal.     Left Ear: Tympanic membrane, ear canal and external ear normal.     Nose: Nose normal.     Mouth/Throat:     Mouth: Mucous membranes are moist.  Eyes:     Conjunctiva/sclera: Conjunctivae normal.     Pupils: Pupils are equal, round, and reactive to light.  Cardiovascular:     Rate and Rhythm: Normal rate and regular rhythm.     Pulses: Normal pulses.     Heart sounds: Normal heart sounds.  Pulmonary:     Effort: Pulmonary effort is normal.     Breath sounds: Normal breath sounds.  Abdominal:     General: Bowel sounds are  normal.     Palpations: Abdomen is soft.     Tenderness: There is no abdominal tenderness.  Musculoskeletal:        General: Normal range of motion.     Cervical back: Normal range of motion and neck supple.     Comments: Strength 5/5 in bilateral upper and lower extremities  Skin:    General: Skin is warm and dry.  Neurological:     General: No focal deficit present.     Mental Status: He is alert and oriented to person, place, and time.     Cranial Nerves: No cranial nerve deficit.     Gait: Gait normal.  Psychiatric:        Mood and Affect: Mood normal.        Behavior: Behavior normal.        Thought Content: Thought content normal.        Judgment: Judgment normal.     BP 128/72 (BP Location: Left Arm, Patient Position: Sitting)   Pulse 97   Temp 98.2 F (36.8 C)   Wt 233 lb 12.8 oz (106.1 kg)   SpO2 96%   BMI 30.85 kg/m  Wt Readings from Last 3 Encounters:  03/24/21 233 lb 12.8 oz (106.1 kg)  02/07/21 220 lb (99.8 kg)  01/29/21 231 lb (104.8 kg)     Health Maintenance Due  Topic Date Due  . COVID-19 Vaccine (4 - Booster for Pfizer series) 03/13/2021    There are no preventive care reminders to display for this patient.  Lab Results  Component Value Date   TSH 2.300 01/26/2019   Lab Results  Component Value Date   WBC 6.1 12/03/2020   HGB 16.0 12/03/2020   HCT 45.9 12/03/2020   MCV 90.2 12/03/2020   PLT 252 12/03/2020   Lab Results  Component Value Date   NA 137 12/03/2020   K 3.8 12/03/2020   CO2 25 12/03/2020   GLUCOSE 101 (H) 12/03/2020  BUN 17 12/03/2020   CREATININE 0.77 12/03/2020   BILITOT 0.5 08/16/2020   ALKPHOS 98 08/16/2020   AST 19 08/16/2020   ALT 20 08/16/2020   PROT 6.3 08/16/2020   ALBUMIN 4.2 08/16/2020   CALCIUM 9.2 12/03/2020   ANIONGAP 8 12/03/2020   Lab Results  Component Value Date   CHOL 209 (H) 08/16/2020   Lab Results  Component Value Date   HDL 46 08/16/2020   Lab Results  Component Value Date   LDLCALC  122 (H) 08/16/2020   Lab Results  Component Value Date   TRIG 232 (H) 08/16/2020   Lab Results  Component Value Date   CHOLHDL 4.4 02/15/2018   No results found for: HGBA1C    Assessment & Plan:   Problem List Items Addressed This Visit      Cardiovascular and Mediastinum   Hypertension - Primary    Chronic, stable. BP 128/72 today. Continue current regimen. Check CMP, CBC. Follow-up in 6 months.      Relevant Orders   Comp Met (CMET)   CBC with Differential     Other   History of prostate cancer    No recurring symptoms. Total prostatectomy >10 years ago. Check PSA today.       Relevant Orders   PSA   Hyperlipidemia    Chronic, stable. Continue current regimen. Check lipid panel. Follow-up in 6 months.      Relevant Orders   Lipid Panel w/o Chol/HDL Ratio   Suspected COVID-19 virus infection    Never tested positive, however wife was positive. Symptoms have resolved.       Obesity (BMI 30-39.9)    BMI 30.85 today. Stated he has gained 20 pounds recently after back surgery. He is starting to be more active again. Discussed diet. Goal is to lose 1-2 pounds per week.        Other Visit Diagnoses    Routine general medical examination at a health care facility       Health maintenance reviewed. Will check TSH today.    Relevant Orders   TSH      No orders of the defined types were placed in this encounter.  Discussed aspirin prophylaxis for myocardial infarction prevention and decision was made to continue ASA  LABORATORY TESTING:  Health maintenance labs ordered today as discussed above.   The natural history of prostate cancer and ongoing controversy regarding screening and potential treatment outcomes of prostate cancer has been discussed with the patient. The meaning of a false positive PSA and a false negative PSA has been discussed. He indicates understanding of the limitations of this screening test and wishes to proceed with screening PSA  testing.   IMMUNIZATIONS:   - Tdap: Tetanus vaccination status reviewed: last tetanus booster within 10 years. - Influenza: Up to date - Pneumovax: Up to date - Prevnar: Up to date - HPV: Not applicable  SCREENING: - Colonoscopy: Up to date  Discussed with patient purpose of the colonoscopy is to detect colon cancer at curable precancerous or early stages   - AAA Screening: Not applicable  -Hearing Test: Not applicable  -Spirometry: Not applicable   PATIENT COUNSELING:    Sexuality: Discussed sexually transmitted diseases, partner selection, use of condoms, avoidance of unintended pregnancy  and contraceptive alternatives.   Advised to avoid cigarette smoking.  I discussed with the patient that most people either abstain from alcohol or drink within safe limits (<=14/week and <=4 drinks/occasion for males, <=7/weeks and <= 3  drinks/occasion for females) and that the risk for alcohol disorders and other health effects rises proportionally with the number of drinks per week and how often a drinker exceeds daily limits.  Discussed cessation/primary prevention of drug use and availability of treatment for abuse.   Diet: Encouraged to adjust caloric intake to maintain  or achieve ideal body weight, to reduce intake of dietary saturated fat and total fat, to limit sodium intake by avoiding high sodium foods and not adding table salt, and to maintain adequate dietary potassium and calcium preferably from fresh fruits, vegetables, and low-fat dairy products.    stressed the importance of regular exercise  Injury prevention: Discussed safety belts, safety helmets, smoke detector, smoking near bedding or upholstery.   Dental health: Discussed importance of regular tooth brushing, flossing, and dental visits.     Follow-up: Return in about 6 months (around 09/24/2021) for htn, hld.    Charyl Dancer, NP

## 2021-03-24 NOTE — Assessment & Plan Note (Signed)
Never tested positive, however wife was positive. Symptoms have resolved.

## 2021-03-25 ENCOUNTER — Ambulatory Visit: Payer: Self-pay | Admitting: *Deleted

## 2021-03-25 LAB — COMPREHENSIVE METABOLIC PANEL
ALT: 22 IU/L (ref 0–44)
AST: 19 IU/L (ref 0–40)
Albumin/Globulin Ratio: 2.3 — ABNORMAL HIGH (ref 1.2–2.2)
Albumin: 4.5 g/dL (ref 3.8–4.8)
Alkaline Phosphatase: 125 IU/L — ABNORMAL HIGH (ref 44–121)
BUN/Creatinine Ratio: 18 (ref 10–24)
BUN: 15 mg/dL (ref 8–27)
Bilirubin Total: 0.3 mg/dL (ref 0.0–1.2)
CO2: 22 mmol/L (ref 20–29)
Calcium: 9.5 mg/dL (ref 8.6–10.2)
Chloride: 102 mmol/L (ref 96–106)
Creatinine, Ser: 0.84 mg/dL (ref 0.76–1.27)
Globulin, Total: 2 g/dL (ref 1.5–4.5)
Glucose: 89 mg/dL (ref 65–99)
Potassium: 4.5 mmol/L (ref 3.5–5.2)
Sodium: 141 mmol/L (ref 134–144)
Total Protein: 6.5 g/dL (ref 6.0–8.5)
eGFR: 94 mL/min/{1.73_m2} (ref >59)

## 2021-03-25 LAB — CBC WITH DIFFERENTIAL/PLATELET
Basophils Absolute: 0.1 10*3/uL (ref 0.0–0.2)
Basos: 1 %
EOS (ABSOLUTE): 0.3 10*3/uL (ref 0.0–0.4)
Eos: 4 %
Hematocrit: 45.8 % (ref 37.5–51.0)
Hemoglobin: 15.2 g/dL (ref 13.0–17.7)
Immature Grans (Abs): 0 10*3/uL (ref 0.0–0.1)
Immature Granulocytes: 1 %
Lymphocytes Absolute: 1.4 10*3/uL (ref 0.7–3.1)
Lymphs: 21 %
MCH: 31 pg (ref 26.6–33.0)
MCHC: 33.2 g/dL (ref 31.5–35.7)
MCV: 94 fL (ref 79–97)
Monocytes Absolute: 0.5 10*3/uL (ref 0.1–0.9)
Monocytes: 7 %
Neutrophils Absolute: 4.3 10*3/uL (ref 1.4–7.0)
Neutrophils: 66 %
Platelets: 304 10*3/uL (ref 150–450)
RBC: 4.9 x10E6/uL (ref 4.14–5.80)
RDW: 13.2 % (ref 11.6–15.4)
WBC: 6.5 10*3/uL (ref 3.4–10.8)

## 2021-03-25 LAB — LIPID PANEL W/O CHOL/HDL RATIO
Cholesterol, Total: 220 mg/dL — ABNORMAL HIGH (ref 100–199)
HDL: 42 mg/dL (ref >39)
LDL Chol Calc (NIH): 93 mg/dL (ref 0–99)
Triglycerides: 515 mg/dL — ABNORMAL HIGH (ref 0–149)
VLDL Cholesterol Cal: 85 mg/dL — ABNORMAL HIGH (ref 5–40)

## 2021-03-25 LAB — PSA: Prostate Specific Ag, Serum: 0.2 ng/mL (ref 0.0–4.0)

## 2021-03-25 LAB — TSH: TSH: 1.92 u[IU]/mL (ref 0.450–4.500)

## 2021-03-25 NOTE — Telephone Encounter (Signed)
Patient calling for lab results: Christopher Huang wanted a phone call and mychart message: Please let Christopher Huang know that his labs overall look great! Your kidneys, liver, thyroid, electrolytes, and blood counts are all normal. Your alkaline phosphatase is slightly elevated, we will just continue to monitor this. Your cholesterol has improved from last time. Continue the crestor. Your triglycerides are elevated, however you may not have been fasting. If they continue to be elevated fasting, we may have to add one more medication to help control this. For diet, be mindful of fats and red meats. Eat lean meat like fish and chicken along with fruits and vegetables. Have a great day! Thank you.   Patient notified of lab results and provider recommendations.  Reason for Disposition . Caller requesting lab results  Protocols used: PCP CALL - NO TRIAGE-A-AH

## 2021-03-27 MED ORDER — ROSUVASTATIN CALCIUM 20 MG PO TABS: 20.0000 mg | ORAL_TABLET | Freq: Every day | ORAL | 1 refills | Status: DC

## 2021-03-31 ENCOUNTER — Encounter: Payer: PPO | Admitting: Internal Medicine

## 2021-04-10 DIAGNOSIS — M5137 Other intervertebral disc degeneration, lumbosacral region: Secondary | ICD-10-CM | POA: Diagnosis not present

## 2021-04-10 DIAGNOSIS — Z981 Arthrodesis status: Secondary | ICD-10-CM | POA: Diagnosis not present

## 2021-04-10 DIAGNOSIS — M4699 Unspecified inflammatory spondylopathy, multiple sites in spine: Secondary | ICD-10-CM | POA: Diagnosis not present

## 2021-04-10 DIAGNOSIS — M4326 Fusion of spine, lumbar region: Secondary | ICD-10-CM | POA: Diagnosis not present

## 2021-04-10 DIAGNOSIS — M47816 Spondylosis without myelopathy or radiculopathy, lumbar region: Secondary | ICD-10-CM | POA: Diagnosis not present

## 2021-07-15 DIAGNOSIS — D2272 Melanocytic nevi of left lower limb, including hip: Secondary | ICD-10-CM | POA: Diagnosis not present

## 2021-07-15 DIAGNOSIS — L570 Actinic keratosis: Secondary | ICD-10-CM | POA: Diagnosis not present

## 2021-07-15 DIAGNOSIS — X32XXXA Exposure to sunlight, initial encounter: Secondary | ICD-10-CM | POA: Diagnosis not present

## 2021-07-15 DIAGNOSIS — D2262 Melanocytic nevi of left upper limb, including shoulder: Secondary | ICD-10-CM | POA: Diagnosis not present

## 2021-07-15 DIAGNOSIS — D485 Neoplasm of uncertain behavior of skin: Secondary | ICD-10-CM | POA: Diagnosis not present

## 2021-07-15 DIAGNOSIS — D2261 Melanocytic nevi of right upper limb, including shoulder: Secondary | ICD-10-CM | POA: Diagnosis not present

## 2021-07-15 DIAGNOSIS — L218 Other seborrheic dermatitis: Secondary | ICD-10-CM | POA: Diagnosis not present

## 2021-07-15 DIAGNOSIS — Z85828 Personal history of other malignant neoplasm of skin: Secondary | ICD-10-CM | POA: Diagnosis not present

## 2021-08-20 ENCOUNTER — Ambulatory Visit (INDEPENDENT_AMBULATORY_CARE_PROVIDER_SITE_OTHER): Payer: PPO

## 2021-08-20 ENCOUNTER — Other Ambulatory Visit: Payer: Self-pay

## 2021-08-20 DIAGNOSIS — Z23 Encounter for immunization: Secondary | ICD-10-CM

## 2021-09-23 ENCOUNTER — Other Ambulatory Visit: Payer: Self-pay | Admitting: Nurse Practitioner

## 2021-09-25 DIAGNOSIS — H524 Presbyopia: Secondary | ICD-10-CM | POA: Diagnosis not present

## 2021-09-25 DIAGNOSIS — Z961 Presence of intraocular lens: Secondary | ICD-10-CM | POA: Diagnosis not present

## 2021-09-25 DIAGNOSIS — H35373 Puckering of macula, bilateral: Secondary | ICD-10-CM | POA: Diagnosis not present

## 2021-09-25 DIAGNOSIS — H43813 Vitreous degeneration, bilateral: Secondary | ICD-10-CM | POA: Diagnosis not present

## 2021-09-26 ENCOUNTER — Other Ambulatory Visit: Payer: Self-pay

## 2021-09-26 ENCOUNTER — Ambulatory Visit (INDEPENDENT_AMBULATORY_CARE_PROVIDER_SITE_OTHER): Payer: PPO | Admitting: Nurse Practitioner

## 2021-09-26 ENCOUNTER — Encounter: Payer: Self-pay | Admitting: Nurse Practitioner

## 2021-09-26 VITALS — BP 134/68 | HR 62 | Temp 98.2°F | Ht 73.0 in | Wt 233.0 lb

## 2021-09-26 DIAGNOSIS — E559 Vitamin D deficiency, unspecified: Secondary | ICD-10-CM | POA: Diagnosis not present

## 2021-09-26 DIAGNOSIS — E669 Obesity, unspecified: Secondary | ICD-10-CM | POA: Diagnosis not present

## 2021-09-26 DIAGNOSIS — I10 Essential (primary) hypertension: Secondary | ICD-10-CM | POA: Diagnosis not present

## 2021-09-26 DIAGNOSIS — E782 Mixed hyperlipidemia: Secondary | ICD-10-CM | POA: Diagnosis not present

## 2021-09-26 DIAGNOSIS — R7301 Impaired fasting glucose: Secondary | ICD-10-CM | POA: Diagnosis not present

## 2021-09-26 DIAGNOSIS — Z8546 Personal history of malignant neoplasm of prostate: Secondary | ICD-10-CM

## 2021-09-26 DIAGNOSIS — Z9889 Other specified postprocedural states: Secondary | ICD-10-CM

## 2021-09-26 LAB — MICROALBUMIN, URINE WAIVED
Creatinine, Urine Waived: 200 mg/dL (ref 10–300)
Microalb, Ur Waived: 10 mg/L (ref 0–19)
Microalb/Creat Ratio: <30 mg/g (ref <30)

## 2021-09-26 LAB — BAYER DCA HB A1C WAIVED: HB A1C (BAYER DCA - WAIVED): 5.6 % (ref 4.8–5.6)

## 2021-09-26 MED ORDER — LISINOPRIL 10 MG PO TABS
10.0000 mg | ORAL_TABLET | ORAL | 4 refills | Status: DC
Start: 2021-09-26 — End: 2022-03-27

## 2021-09-26 MED ORDER — MELOXICAM 7.5 MG PO TABS: 7.5000 mg | ORAL_TABLET | Freq: Every day | ORAL | 4 refills | Status: DC

## 2021-09-26 MED ORDER — ROSUVASTATIN CALCIUM 20 MG PO TABS: 20.0000 mg | ORAL_TABLET | Freq: Every day | ORAL | 4 refills | Status: DC

## 2021-09-26 NOTE — Assessment & Plan Note (Signed)
Chronic, ongoing with BP at goal on current regimen.  Continue Lisinopril 10 MG on M-T-W-TH-Fri.  Recommend he monitor BP at least a few mornings a week at home and document.  DASH diet at home.  Labs today: CMP, urine ALB, TSH.  Return in 6 months.

## 2021-09-26 NOTE — Assessment & Plan Note (Signed)
Ongoing and taking supplement at home, continue this and check Vit D today. 

## 2021-09-26 NOTE — Assessment & Plan Note (Signed)
Continue Gabapentin and will change to Meloxicam per request as needed, script sent.

## 2021-09-26 NOTE — Progress Notes (Signed)
BP 134/68   Pulse 62 Comment: apical  Temp 98.2 F (36.8 C) (Oral)   Ht $R'6\' 1"'lO$  (1.854 m)   Wt 233 lb (105.7 kg)   SpO2 97%   BMI 30.74 kg/m    Subjective:    Patient ID: Christopher Huang, male    DOB: [birth year], 70 y.o.   MRN: [id number]  HPI: Christopher Huang is a 70 y.o. male  Chief Complaint  Patient presents with   Prediabetes   Hyperlipidemia   Hypertension   Medication Problem    Pt's wife wants to discuss changing from Celebrex to Meloxicam    HYPERTENSION / HYPERLIPIDEMIA Continues on Lisinopril and Rosuvastatin.  History of prostate cancer -- 2008 -- had complete removal of prostate == PSA in May 0.2.   History of back surgery -- Dr. Izora Ribas, continues on Gabapentin and Meloxicam.  Past labs with some mild elevation in glucose, no recent A1c.  Satisfied with current treatment? yes Duration of hypertension: chronic BP monitoring frequency: not checking BP range:  BP medication side effects: no Past BP meds:  Duration of hyperlipidemia: chronic Cholesterol medication side effects: no Cholesterol supplements: none Past cholesterol medications:  Medication compliance: good compliance Aspirin: yes Recent stressors: no Recurrent headaches: no Visual changes: no Palpitations: no Dyspnea: no Chest pain: no Lower extremity edema: no Dizzy/lightheaded: no   Relevant past medical, surgical, family and social history reviewed and updated as indicated. Interim medical history since our last visit reviewed. Allergies and medications reviewed and updated.  Review of Systems  Constitutional:  Negative for activity change, diaphoresis, fatigue and fever.  Respiratory:  Negative for cough, chest tightness, shortness of breath and wheezing.   Cardiovascular:  Negative for chest pain, palpitations and leg swelling.  Gastrointestinal: Negative.   Endocrine: Negative.   Neurological: Negative.   Psychiatric/Behavioral: Negative.     Per HPI unless specifically  indicated above     Objective:    BP 134/68   Pulse 62 Comment: apical  Temp 98.2 F (36.8 C) (Oral)   Ht $R'6\' 1"'Gi$  (1.854 m)   Wt 233 lb (105.7 kg)   SpO2 97%   BMI 30.74 kg/m   Wt Readings from Last 3 Encounters:  09/26/21 233 lb (105.7 kg)  03/24/21 233 lb 12.8 oz (106.1 kg)  02/07/21 220 lb (99.8 kg)    Physical Exam Vitals and nursing note reviewed.  Constitutional:      General: He is awake. He is not in acute distress.    Appearance: He is well-developed and well-groomed. He is obese. He is not ill-appearing or toxic-appearing.  HENT:     Head: Normocephalic and atraumatic.     Right Ear: Hearing normal. No drainage.     Left Ear: Hearing normal. No drainage.  Eyes:     General: Lids are normal.        Right eye: No discharge.        Left eye: No discharge.     Conjunctiva/sclera: Conjunctivae normal.     Pupils: Pupils are equal, round, and reactive to light.  Neck:     Thyroid: No thyromegaly.     Vascular: No carotid bruit.  Cardiovascular:     Rate and Rhythm: Normal rate and regular rhythm.     Heart sounds: Normal heart sounds, S1 normal and S2 normal. No murmur heard.   No gallop.  Pulmonary:     Effort: Pulmonary effort is normal. No accessory muscle usage or respiratory distress.  Breath sounds: Normal breath sounds.  Abdominal:     General: Bowel sounds are normal. There is no distension.     Palpations: Abdomen is soft.  Musculoskeletal:        General: Normal range of motion.     Cervical back: Normal range of motion and neck supple.     Right lower leg: No edema.     Left lower leg: No edema.  Lymphadenopathy:     Cervical: No cervical adenopathy.  Skin:    General: Skin is warm and dry.     Capillary Refill: Capillary refill takes less than 2 seconds.  Neurological:     Mental Status: He is alert and oriented to person, place, and time.     Deep Tendon Reflexes: Reflexes are normal and symmetric.  Psychiatric:        Mood and Affect:  Mood normal.        Speech: Speech normal.        Behavior: Behavior normal. Behavior is cooperative.        Thought Content: Thought content normal.    Results for orders placed or performed in visit on 03/24/21  Comp Met (CMET)  Result Value Ref Range   Glucose 89 65 - 99 mg/dL   BUN 15 8 - 27 mg/dL   Creatinine, Ser 0.84 0.76 - 1.27 mg/dL   eGFR 94 >59 mL/min/1.73   BUN/Creatinine Ratio 18 10 - 24   Sodium 141 134 - 144 mmol/L   Potassium 4.5 3.5 - 5.2 mmol/L   Chloride 102 96 - 106 mmol/L   CO2 22 20 - 29 mmol/L   Calcium 9.5 8.6 - 10.2 mg/dL   Total Protein 6.5 6.0 - 8.5 g/dL   Albumin 4.5 3.8 - 4.8 g/dL   Globulin, Total 2.0 1.5 - 4.5 g/dL   Albumin/Globulin Ratio 2.3 (H) 1.2 - 2.2   Bilirubin Total 0.3 0.0 - 1.2 mg/dL   Alkaline Phosphatase 125 (H) 44 - 121 IU/L   AST 19 0 - 40 IU/L   ALT 22 0 - 44 IU/L  CBC with Differential  Result Value Ref Range   WBC 6.5 3.4 - 10.8 x10E3/uL   RBC 4.90 4.14 - 5.80 x10E6/uL   Hemoglobin 15.2 13.0 - 17.7 g/dL   Hematocrit 45.8 37.5 - 51.0 %   MCV 94 79 - 97 fL   MCH 31.0 26.6 - 33.0 pg   MCHC 33.2 31.5 - 35.7 g/dL   RDW 13.2 11.6 - 15.4 %   Platelets 304 150 - 450 x10E3/uL   Neutrophils 66 Not Estab. %   Lymphs 21 Not Estab. %   Monocytes 7 Not Estab. %   Eos 4 Not Estab. %   Basos 1 Not Estab. %   Neutrophils Absolute 4.3 1.4 - 7.0 x10E3/uL   Lymphocytes Absolute 1.4 0.7 - 3.1 x10E3/uL   Monocytes Absolute 0.5 0.1 - 0.9 x10E3/uL   EOS (ABSOLUTE) 0.3 0.0 - 0.4 x10E3/uL   Basophils Absolute 0.1 0.0 - 0.2 x10E3/uL   Immature Granulocytes 1 Not Estab. %   Immature Grans (Abs) 0.0 0.0 - 0.1 x10E3/uL  Lipid Panel w/o Chol/HDL Ratio  Result Value Ref Range   Cholesterol, Total 220 (H) 100 - 199 mg/dL   Triglycerides 515 (H) 0 - 149 mg/dL   HDL 42 >39 mg/dL   VLDL Cholesterol Cal 85 (H) 5 - 40 mg/dL   LDL Chol Calc (NIH) 93 0 - 99 mg/dL  TSH  Result Value  Ref Range   TSH 1.920 0.450 - 4.500 uIU/mL  PSA  Result Value Ref  Range   Prostate Specific Ag, Serum 0.2 0.0 - 4.0 ng/mL      Assessment & Plan:   Problem List Items Addressed This Visit       Cardiovascular and Mediastinum   Hypertension - Primary    Chronic, ongoing with BP at goal on current regimen.  Continue Lisinopril 10 MG on M-T-W-TH-Fri.  Recommend he monitor BP at least a few mornings a week at home and document.  DASH diet at home.  Labs today: CMP, urine ALB, TSH.  Return in 6 months.       Relevant Medications   lisinopril (ZESTRIL) 10 MG tablet   rosuvastatin (CRESTOR) 20 MG tablet   Other Relevant Orders   Microalbumin, Urine Waived   Comprehensive metabolic panel   TSH     Other   History of back surgery    Continue Gabapentin and will change to Meloxicam per request as needed, script sent.      History of prostate cancer    PSA up to date and remains stable.      Hyperlipidemia    Chronic, ongoing.  Continue Rosuvastatin daily.  Adjust dose as needed.  Obtain labs today: CMP and Lipid.  Return in 6 months.      Relevant Medications   lisinopril (ZESTRIL) 10 MG tablet   rosuvastatin (CRESTOR) 20 MG tablet   Other Relevant Orders   Comprehensive metabolic panel   Lipid Panel w/o Chol/HDL Ratio   Obesity (BMI 30-39.9)    BMI 30.74.  Recommended eating smaller high protein, low fat meals more frequently and exercising 30 mins a day 5 times a week with a goal of 10-15lb weight loss in the next 3 months. Patient voiced their understanding and motivation to adhere to these recommendations.        Vitamin D deficiency    Ongoing and taking supplement at home, continue this and check Vit D today.      Relevant Orders   VITAMIN D 25 Hydroxy (Vit-D Deficiency, Fractures)   Other Visit Diagnoses     IFG (impaired fasting glucose)       A1c 5.6% today, stable with no diabetes or prediabetes.   Relevant Orders   Bayer DCA Hb A1c Waived   Microalbumin, Urine Waived        Follow up plan: Return in about 6 months  (around 03/26/2022) for Annual Physical.

## 2021-09-26 NOTE — Assessment & Plan Note (Signed)
PSA up to date and remains stable.

## 2021-09-26 NOTE — Assessment & Plan Note (Addendum)
Chronic, ongoing.  Continue Rosuvastatin daily.  Adjust dose as needed.  Obtain labs today: CMP and Lipid.  Return in 6 months.

## 2021-09-26 NOTE — Assessment & Plan Note (Signed)
BMI 30.74.  Recommended eating smaller high protein, low fat meals more frequently and exercising 30 mins a day 5 times a week with a goal of 10-15lb weight loss in the next 3 months. Patient voiced their understanding and motivation to adhere to these recommendations.

## 2021-09-27 LAB — COMPREHENSIVE METABOLIC PANEL
ALT: 20 IU/L (ref 0–44)
AST: 24 IU/L (ref 0–40)
Albumin/Globulin Ratio: 2.1 (ref 1.2–2.2)
Albumin: 4.4 g/dL (ref 3.8–4.8)
Alkaline Phosphatase: 96 IU/L (ref 44–121)
BUN/Creatinine Ratio: 16 (ref 10–24)
BUN: 13 mg/dL (ref 8–27)
Bilirubin Total: 0.5 mg/dL (ref 0.0–1.2)
CO2: 26 mmol/L (ref 20–29)
Calcium: 8.9 mg/dL (ref 8.6–10.2)
Chloride: 103 mmol/L (ref 96–106)
Creatinine, Ser: 0.81 mg/dL (ref 0.76–1.27)
Globulin, Total: 2.1 g/dL (ref 1.5–4.5)
Glucose: 91 mg/dL (ref 70–99)
Potassium: 4.3 mmol/L (ref 3.5–5.2)
Sodium: 140 mmol/L (ref 134–144)
Total Protein: 6.5 g/dL (ref 6.0–8.5)
eGFR: 95 mL/min/{1.73_m2} (ref >59)

## 2021-09-27 LAB — VITAMIN D 25 HYDROXY (VIT D DEFICIENCY, FRACTURES): Vit D, 25-Hydroxy: 35 ng/mL (ref 30.0–100.0)

## 2021-09-27 LAB — LIPID PANEL W/O CHOL/HDL RATIO
Cholesterol, Total: 144 mg/dL (ref 100–199)
HDL: 48 mg/dL (ref >39)
LDL Chol Calc (NIH): 69 mg/dL (ref 0–99)
Triglycerides: 160 mg/dL — ABNORMAL HIGH (ref 0–149)
VLDL Cholesterol Cal: 27 mg/dL (ref 5–40)

## 2021-09-27 LAB — TSH: TSH: 1.56 u[IU]/mL (ref 0.450–4.500)

## 2021-09-27 NOTE — Progress Notes (Signed)
Contacted via [place] morning [name], your labs have returned and are overall stable with no medication changes needed.  This includes A1c which shows no diabetes or prediabetes.  Great news!!  Any questions? Keep being awesome!!  Thank you for allowing me to participate in your care.  I appreciate you. Kindest regards, Tarita Deshmukh

## 2021-11-26 ENCOUNTER — Encounter: Payer: Self-pay | Admitting: Internal Medicine

## 2021-11-26 ENCOUNTER — Telehealth (INDEPENDENT_AMBULATORY_CARE_PROVIDER_SITE_OTHER): Payer: PPO | Admitting: Internal Medicine

## 2021-11-26 DIAGNOSIS — I10 Essential (primary) hypertension: Secondary | ICD-10-CM

## 2021-11-26 DIAGNOSIS — U071 COVID-19: Secondary | ICD-10-CM

## 2021-11-26 MED ORDER — FEXOFENADINE HCL 180 MG PO TABS: 180.0000 mg | ORAL_TABLET | Freq: Every day | ORAL | 1 refills | Status: DC

## 2021-11-26 MED ORDER — BENZONATATE 100 MG PO CAPS: 100.0000 mg | ORAL_CAPSULE | Freq: Two times a day (BID) | ORAL | 0 refills | Status: DC | PRN

## 2021-11-26 MED ORDER — MOLNUPIRAVIR EUA 200MG CAPSULE: 4.0000 | ORAL_CAPSULE | Freq: Two times a day (BID) | ORAL | 0 refills | Status: AC

## 2021-11-26 NOTE — Progress Notes (Signed)
BP (!) 172/84    Temp 99.5 F (37.5 C) (Oral)    Subjective:    Patient ID: Christopher Huang, male    DOB: 1951-10-24, 71 y.o.   MRN: [id number]  Chief Complaint  Patient presents with   Covid Positive    Tested positive this morning, symptoms started yesterday, cough, head congestion, fever, sinus pressure and fatigue. Has tried Mucinex D    HPI: Christopher Huang is a 71 y.o. male   This visit was completed via telephone due to the restrictions of the COVID-19 pandemic. All issues as above were discussed and addressed but no physical exam was performed. If it was felt that the patient should be evaluated in the office, they were directed there. The patient verbally consented to this visit. Patient was unable to complete an audio/visual visit due to Technical difficulties. Due to the catastrophic nature of the COVID-19 pandemic, this visit was done through audio contact only. Location of the patient: home Location of the provider: work Those involved with this call:  Provider: Charlynne Cousins, MD CMA: Frazier Butt, [place] Desk/Registration: [employer]  Time spent on call: 10 minutes on the phone discussing health concerns. 10 minutes total spent in review of patient's record and preparation of their chart.  Patient presents with: home covid test +ve Covid Positive: Tested positive this morning, symptoms started yesterday, cough, head congestion, fever, sinus pressure and fatigue. Has tried Mucinex D    URI  This is a new problem. The maximum temperature recorded prior to his arrival was 100.4 - 100.9 F. Associated symptoms include congestion, coughing, headaches, rhinorrhea and sinus pain. Pertinent negatives include no abdominal pain, chest pain, joint pain, joint swelling, nausea, sneezing or sore throat. Associated symptoms comments: Congestion headahce above eyes .   Chief Complaint  Patient presents with   Covid Positive    Tested positive this morning, symptoms started  yesterday, cough, head congestion, fever, sinus pressure and fatigue. Has tried Mucinex D    Relevant past medical, surgical, family and social history reviewed and updated as indicated. Interim medical history since our last visit reviewed. Allergies and medications reviewed and updated.  Review of Systems  HENT:  Positive for congestion, rhinorrhea and sinus pain. Negative for sneezing and sore throat.   Respiratory:  Positive for cough.   Cardiovascular:  Negative for chest pain.  Gastrointestinal:  Negative for abdominal pain and nausea.  Musculoskeletal:  Negative for joint pain.  Neurological:  Positive for headaches.   Per HPI unless specifically indicated above     Objective:    BP (!) 172/84    Temp 99.5 F (37.5 C) (Oral)   Wt Readings from Last 3 Encounters:  09/26/21 233 lb (105.7 kg)  03/24/21 233 lb 12.8 oz (106.1 kg)  02/07/21 220 lb (99.8 kg)    Physical Exam  Unable to peform sec to virtual visit.   Results for orders placed or performed in visit on 09/26/21  Bayer DCA Hb A1c Waived  Result Value Ref Range   HB A1C (BAYER DCA - WAIVED) 5.6 4.8 - 5.6 %  Microalbumin, Urine Waived  Result Value Ref Range   Microalb, Ur Waived 10 0 - 19 mg/L   Creatinine, Urine Waived 200 10 - 300 mg/dL   Microalb/Creat Ratio <30 <30 mg/g  Comprehensive metabolic panel  Result Value Ref Range   Glucose 91 70 - 99 mg/dL   BUN 13 8 - 27 mg/dL   Creatinine, Ser 0.81  0.76 - 1.27 mg/dL   eGFR 95 >59 mL/min/1.73   BUN/Creatinine Ratio 16 10 - 24   Sodium 140 134 - 144 mmol/L   Potassium 4.3 3.5 - 5.2 mmol/L   Chloride 103 96 - 106 mmol/L   CO2 26 20 - 29 mmol/L   Calcium 8.9 8.6 - 10.2 mg/dL   Total Protein 6.5 6.0 - 8.5 g/dL   Albumin 4.4 3.8 - 4.8 g/dL   Globulin, Total 2.1 1.5 - 4.5 g/dL   Albumin/Globulin Ratio 2.1 1.2 - 2.2   Bilirubin Total 0.5 0.0 - 1.2 mg/dL   Alkaline Phosphatase 96 44 - 121 IU/L   AST 24 0 - 40 IU/L   ALT 20 0 - 44 IU/L  TSH  Result Value  Ref Range   TSH 1.560 0.450 - 4.500 uIU/mL  Lipid Panel w/o Chol/HDL Ratio  Result Value Ref Range   Cholesterol, Total 144 100 - 199 mg/dL   Triglycerides 160 (H) 0 - 149 mg/dL   HDL 48 >39 mg/dL   VLDL Cholesterol Cal 27 5 - 40 mg/dL   LDL Chol Calc (NIH) 69 0 - 99 mg/dL  VITAMIN D 25 Hydroxy (Vit-D Deficiency, Fractures)  Result Value Ref Range   Vit D, 25-Hydroxy 35.0 30.0 - 100.0 ng/mL        Current Outpatient Medications:    Acetaminophen (ACETAMINOPHEN EXTRA STRENGTH) 500 MG capsule, Take 1 capsule by mouth every 6 (six) hours as needed for fever., Disp: , Rfl:    ASPIRIN 81 PO, Take 81 mg by mouth daily., Disp: , Rfl:    Cholecalciferol (VITAMIN D3) 50 MCG (2000 UT) TABS, Take 2,000 Units by mouth daily., Disp: , Rfl:    Coenzyme Q10 100 MG TABS, Take 100 mg by mouth daily., Disp: , Rfl:    docusate sodium (COLACE) 100 MG capsule, Take 100 mg by mouth daily., Disp: , Rfl:    fluticasone (FLONASE) 50 MCG/ACT nasal spray, PLACE 2 SPRAYS INTO EACH NOSTRIL DAILY, Disp: 48 g, Rfl: 3   gabapentin (NEURONTIN) 300 MG capsule, Take 300 mg by mouth 3 (three) times daily., Disp: , Rfl:    lisinopril (ZESTRIL) 10 MG tablet, Take 1 tablet (10 mg total) by mouth every Monday, Tuesday, Wednesday, Thursday, and Friday., Disp: 60 tablet, Rfl: 4   meloxicam (MOBIC) 7.5 MG tablet, Take 1 tablet (7.5 mg total) by mouth daily., Disp: 90 tablet, Rfl: 4   Multiple Vitamin (MULTIVITAMIN WITH MINERALS) TABS tablet, Take 1 tablet by mouth daily. Men's Multivitamin 50+, Disp: , Rfl:    rosuvastatin (CRESTOR) 20 MG tablet, Take 1 tablet (20 mg total) by mouth daily., Disp: 90 tablet, Rfl: 4    Assessment & Plan:  COVID : positive : will start pt on molnupiravir for such  Increase fluid intake.  Headahce - tyelnol every 4-6 hrs prn and alternate this with ibubrufen 800 mg q 8 hrly. Sinus pressure: use steam inhalation.  OTC -  Allegra / claritin.  5 days quarantine.   Pt verbalized understanding of  such, to get to the office at today and get a curb side test for the above.  Problem List Items Addressed This Visit   None    No orders of the defined types were placed in this encounter.    No orders of the defined types were placed in this encounter.    Follow up plan: No follow-ups on file.

## 2021-12-05 ENCOUNTER — Encounter: Payer: Self-pay | Admitting: Internal Medicine

## 2021-12-05 ENCOUNTER — Ambulatory Visit (INDEPENDENT_AMBULATORY_CARE_PROVIDER_SITE_OTHER): Payer: PPO | Admitting: Internal Medicine

## 2021-12-05 VITALS — Temp 98.0°F

## 2021-12-05 DIAGNOSIS — J019 Acute sinusitis, unspecified: Secondary | ICD-10-CM | POA: Diagnosis not present

## 2021-12-05 MED ORDER — CHERATUSSIN AC 100-10 MG/5ML PO SOLN: 5.0000 mL | Freq: Every evening | ORAL | 0 refills | Status: DC

## 2021-12-05 MED ORDER — FEXOFENADINE HCL 180 MG PO TABS: 180.0000 mg | ORAL_TABLET | Freq: Every day | ORAL | 1 refills | Status: DC

## 2021-12-05 MED ORDER — AZITHROMYCIN 250 MG PO TABS: ORAL_TABLET | ORAL | 0 refills | Status: AC

## 2021-12-05 MED ORDER — BENZONATATE 100 MG PO CAPS: 100.0000 mg | ORAL_CAPSULE | Freq: Three times a day (TID) | ORAL | 1 refills | Status: DC | PRN

## 2021-12-05 NOTE — Progress Notes (Signed)
Temp 98 F (36.7 C) (Oral)    Subjective:    Patient ID: Christopher Huang, male    DOB: 06-22-1951, 71 y.o.   MRN: [id number]  Chief Complaint  Patient presents with   Cough    Headache, sinus pressure, no fever started for over a week. Tested positive on 11/25/21, started coughing a lot last night and a headache.    HPI: Christopher Huang is a 71 y.o. male   This visit was completed via video visit through MyChart due to the restrictions of the COVID-19 pandemic. All issues as above were discussed and addressed. Physical exam was done as above through visual confirmation on video through MyChart. If it was felt that the patient should be evaluated in the office, they were directed there. The patient verbally consented to this visit. Location of the patient: home Location of the provider: work Those involved with this call:  Provider: Charlynne Cousins, MD CMA: Frazier Butt, [place] Desk/Registration: [employer]  Time spent on call: 10 minutes with patient face to face via video conference. More than 50% of this time was spent in counseling and coordination of care. 1 minutes total spent in review of patient's record and preparation of their chart.  WAS SEEn virtually for COVID 19 was on molnupiravir for such , better for a day then started feeling worse.  Patient presents with: Cough: Headache, sinus pressure, no fever started for over a week. Tested positive on 11/25/21, started coughing a lot last night and a headache.     Cough This is a new (got better and last night has had a recurrent cough this morning sinus pressure and a headhace no fever) problem. The current episode started 1 to 4 weeks ago (yellow phelgm). The problem has been gradually worsening. Associated symptoms include ear congestion and nasal congestion. Pertinent negatives include no chest pain, chills, ear pain, fever, headaches, heartburn, hemoptysis, myalgias, postnasal drip, rash, rhinorrhea, sore throat,  shortness of breath, sweats, weight loss or wheezing.   Chief Complaint  Patient presents with   Cough    Headache, sinus pressure, no fever started for over a week. Tested positive on 11/25/21, started coughing a lot last night and a headache.    Relevant past medical, surgical, family and social history reviewed and updated as indicated. Interim medical history since our last visit reviewed. Allergies and medications reviewed and updated.  Review of Systems  Constitutional:  Negative for chills, fever and weight loss.  HENT:  Negative for ear pain, postnasal drip, rhinorrhea and sore throat.   Respiratory:  Positive for cough. Negative for hemoptysis, shortness of breath and wheezing.   Cardiovascular:  Negative for chest pain.  Gastrointestinal:  Negative for heartburn.  Musculoskeletal:  Negative for myalgias.  Skin:  Negative for rash.  Neurological:  Negative for headaches.   Per HPI unless specifically indicated above     Objective:    Temp 98 F (36.7 C) (Oral)   Wt Readings from Last 3 Encounters:  09/26/21 233 lb (105.7 kg)  03/24/21 233 lb 12.8 oz (106.1 kg)  02/07/21 220 lb (99.8 kg)    Physical Exam  Unable to peform sec to virtual visit.      Results for orders placed or performed in visit on 09/26/21  Bayer DCA Hb A1c Waived  Result Value Ref Range   HB A1C (BAYER DCA - WAIVED) 5.6 4.8 - 5.6 %  Microalbumin, Urine Waived  Result Value Ref Range  Microalb, Ur Waived 10 0 - 19 mg/L   Creatinine, Urine Waived 200 10 - 300 mg/dL   Microalb/Creat Ratio <30 <30 mg/g  Comprehensive metabolic panel  Result Value Ref Range   Glucose 91 70 - 99 mg/dL   BUN 13 8 - 27 mg/dL   Creatinine, Ser 0.81 0.76 - 1.27 mg/dL   eGFR 95 >59 mL/min/1.73   BUN/Creatinine Ratio 16 10 - 24   Sodium 140 134 - 144 mmol/L   Potassium 4.3 3.5 - 5.2 mmol/L   Chloride 103 96 - 106 mmol/L   CO2 26 20 - 29 mmol/L   Calcium 8.9 8.6 - 10.2 mg/dL   Total Protein 6.5 6.0 - 8.5 g/dL    Albumin 4.4 3.8 - 4.8 g/dL   Globulin, Total 2.1 1.5 - 4.5 g/dL   Albumin/Globulin Ratio 2.1 1.2 - 2.2   Bilirubin Total 0.5 0.0 - 1.2 mg/dL   Alkaline Phosphatase 96 44 - 121 IU/L   AST 24 0 - 40 IU/L   ALT 20 0 - 44 IU/L  TSH  Result Value Ref Range   TSH 1.560 0.450 - 4.500 uIU/mL  Lipid Panel w/o Chol/HDL Ratio  Result Value Ref Range   Cholesterol, Total 144 100 - 199 mg/dL   Triglycerides 160 (H) 0 - 149 mg/dL   HDL 48 >39 mg/dL   VLDL Cholesterol Cal 27 5 - 40 mg/dL   LDL Chol Calc (NIH) 69 0 - 99 mg/dL  VITAMIN D 25 Hydroxy (Vit-D Deficiency, Fractures)  Result Value Ref Range   Vit D, 25-Hydroxy 35.0 30.0 - 100.0 ng/mL       Assessment & Plan:   URI :was on molnupiravir for such Will start pt on zpak for such , start tessalon pearls tid prn during the day and cheratussin q pm only. Increase fluid intake.  Headahce - tyelnol every 4-6 hrs prn and alternate this with ibubrufen 800 mg q 8 hrly. Sinus pressure: use steam inhalation. allegra / claritin.   Pt verbalized understanding of such, to get to the office at today and get a curb side test for the above.   Problem List Items Addressed This Visit   None   Follow up plan: No follow-ups on file.

## 2022-01-08 DIAGNOSIS — Z981 Arthrodesis status: Secondary | ICD-10-CM | POA: Diagnosis not present

## 2022-01-08 DIAGNOSIS — M4326 Fusion of spine, lumbar region: Secondary | ICD-10-CM | POA: Diagnosis not present

## 2022-01-08 DIAGNOSIS — M542 Cervicalgia: Secondary | ICD-10-CM | POA: Diagnosis not present

## 2022-01-20 DIAGNOSIS — D2271 Melanocytic nevi of right lower limb, including hip: Secondary | ICD-10-CM | POA: Diagnosis not present

## 2022-01-20 DIAGNOSIS — X32XXXA Exposure to sunlight, initial encounter: Secondary | ICD-10-CM | POA: Diagnosis not present

## 2022-01-20 DIAGNOSIS — L570 Actinic keratosis: Secondary | ICD-10-CM | POA: Diagnosis not present

## 2022-01-20 DIAGNOSIS — D485 Neoplasm of uncertain behavior of skin: Secondary | ICD-10-CM | POA: Diagnosis not present

## 2022-01-20 DIAGNOSIS — D2262 Melanocytic nevi of left upper limb, including shoulder: Secondary | ICD-10-CM | POA: Diagnosis not present

## 2022-01-20 DIAGNOSIS — C44519 Basal cell carcinoma of skin of other part of trunk: Secondary | ICD-10-CM | POA: Diagnosis not present

## 2022-01-20 DIAGNOSIS — Z872 Personal history of diseases of the skin and subcutaneous tissue: Secondary | ICD-10-CM | POA: Diagnosis not present

## 2022-01-20 DIAGNOSIS — D2261 Melanocytic nevi of right upper limb, including shoulder: Secondary | ICD-10-CM | POA: Diagnosis not present

## 2022-01-20 DIAGNOSIS — Z85828 Personal history of other malignant neoplasm of skin: Secondary | ICD-10-CM | POA: Diagnosis not present

## 2022-01-20 DIAGNOSIS — C44319 Basal cell carcinoma of skin of other parts of face: Secondary | ICD-10-CM | POA: Diagnosis not present

## 2022-02-09 ENCOUNTER — Ambulatory Visit (INDEPENDENT_AMBULATORY_CARE_PROVIDER_SITE_OTHER): Payer: PPO | Admitting: *Deleted

## 2022-02-09 DIAGNOSIS — Z0000 Encounter for general adult medical examination without abnormal findings: Secondary | ICD-10-CM

## 2022-02-09 NOTE — Patient Instructions (Signed)
Christopher Huang , ?Thank you for taking time to come for your Medicare Wellness Visit. I appreciate your ongoing commitment to your health goals. Please review the following plan we discussed and let me know if I can assist you in the future.  ? ?Screening recommendations/referrals: ?Colonoscopy: Education provided ?Recommended yearly ophthalmology/optometry visit for glaucoma screening and checkup ?Recommended yearly dental visit for hygiene and checkup ? ?Vaccinations: ?Influenza vaccine: up to date ?Pneumococcal vaccine: up to date ?Tdap vaccine: up to date ?Shingles vaccine: Education provided   ? ?Advanced directives: Education provided ? ?Conditions/risks identified:  ? ?Next appointment: 03-27-2022 @ 8:oo [place] ? ?Preventive Care 2 Years and Older, Male ?Preventive care refers to lifestyle choices and visits with your health care provider that can promote health and wellness. ?What does preventive care include? ?A yearly physical exam. This is also called an annual well check. ?Dental exams once or twice a year. ?Routine eye exams. Ask your health care provider how often you should have your eyes checked. ?Personal lifestyle choices, including: ?Daily care of your teeth and gums. ?Regular physical activity. ?Eating a healthy diet. ?Avoiding tobacco and drug use. ?Limiting alcohol use. ?Practicing safe sex. ?Taking low doses of aspirin every day. ?Taking vitamin and mineral supplements as recommended by your health care provider. ?What happens during an annual well check? ?The services and screenings done by your health care provider during your annual well check will depend on your age, overall health, lifestyle risk factors, and family history of disease. ?Counseling  ?Your health care provider may ask you questions about your: ?Alcohol use. ?Tobacco use. ?Drug use. ?Emotional well-being. ?Home and relationship well-being. ?Sexual activity. ?Eating habits. ?History of falls. ?Memory and ability to understand  (cognition). ?Work and work Statistician. ?Screening  ?You may have the following tests or measurements: ?Height, weight, and BMI. ?Blood pressure. ?Lipid and cholesterol levels. These may be checked every 5 years, or more frequently if you are over 11 years old. ?Skin check. ?Lung cancer screening. You may have this screening every year starting at age 3 if you have a 30-pack-year history of smoking and currently smoke or have quit within the past 15 years. ?Fecal occult blood test (FOBT) of the stool. You may have this test every year starting at age 63. ?Flexible sigmoidoscopy or colonoscopy. You may have a sigmoidoscopy every 5 years or a colonoscopy every 10 years starting at age 7. ?Prostate cancer screening. Recommendations will vary depending on your family history and other risks. ?Hepatitis C blood test. ?Hepatitis B blood test. ?Sexually transmitted disease (STD) testing. ?Diabetes screening. This is done by checking your blood sugar (glucose) after you have not eaten for a while (fasting). You may have this done every 1-3 years. ?Abdominal aortic aneurysm (AAA) screening. You may need this if you are a current or former smoker. ?Osteoporosis. You may be screened starting at age 49 if you are at high risk. ?Talk with your health care provider about your test results, treatment options, and if necessary, the need for more tests. ?Vaccines  ?Your health care provider may recommend certain vaccines, such as: ?Influenza vaccine. This is recommended every year. ?Tetanus, diphtheria, and acellular pertussis (Tdap, Td) vaccine. You may need a Td booster every 10 years. ?Zoster vaccine. You may need this after age 44. ?Pneumococcal 13-valent conjugate (PCV13) vaccine. One dose is recommended after age 41. ?Pneumococcal polysaccharide (PPSV23) vaccine. One dose is recommended after age 8. ?Talk to your health care provider about which screenings and vaccines you  need and how often you need them. ?This  information is not intended to replace advice given to you by your health care provider. Make sure you discuss any questions you have with your health care provider. ?Document Released: 12/06/2015 Document Revised: 07/29/2016 Document Reviewed: 09/10/2015 ?Elsevier Interactive Patient Education ? 2017 [employer]. ? ?Fall Prevention in the Home ?Falls can cause injuries. They can happen to people of all ages. There are many things you can do to make your home safe and to help prevent falls. ?What can I do on the outside of my home? ?Regularly fix the edges of walkways and driveways and fix any cracks. ?Remove anything that might make you trip as you walk through a door, such as a raised step or threshold. ?Trim any bushes or trees on the path to your home. ?Use bright outdoor lighting. ?Clear any walking paths of anything that might make someone trip, such as rocks or tools. ?Regularly check to see if handrails are loose or broken. Make sure that both sides of any steps have handrails. ?Any raised decks and porches should have guardrails on the edges. ?Have any leaves, snow, or ice cleared regularly. ?Use sand or salt on walking paths during winter. ?Clean up any spills in your garage right away. This includes oil or grease spills. ?What can I do in the bathroom? ?Use night lights. ?Install grab bars by the toilet and in the tub and shower. Do not use towel bars as grab bars. ?Use non-skid mats or decals in the tub or shower. ?If you need to sit down in the shower, use a plastic, non-slip stool. ?Keep the floor dry. Clean up any water that spills on the floor as soon as it happens. ?Remove soap buildup in the tub or shower regularly. ?Attach bath mats securely with double-sided non-slip rug tape. ?Do not have throw rugs and other things on the floor that can make you trip. ?What can I do in the bedroom? ?Use night lights. ?Make sure that you have a light by your bed that is easy to reach. ?Do not use any sheets or  blankets that are too big for your bed. They should not hang down onto the floor. ?Have a firm chair that has side arms. You can use this for support while you get dressed. ?Do not have throw rugs and other things on the floor that can make you trip. ?What can I do in the kitchen? ?Clean up any spills right away. ?Avoid walking on wet floors. ?Keep items that you use a lot in easy-to-reach places. ?If you need to reach something above you, use a strong step stool that has a grab bar. ?Keep electrical cords out of the way. ?Do not use floor polish or wax that makes floors slippery. If you must use wax, use non-skid floor wax. ?Do not have throw rugs and other things on the floor that can make you trip. ?What can I do with my stairs? ?Do not leave any items on the stairs. ?Make sure that there are handrails on both sides of the stairs and use them. Fix handrails that are broken or loose. Make sure that handrails are as long as the stairways. ?Check any carpeting to make sure that it is firmly attached to the stairs. Fix any carpet that is loose or worn. ?Avoid having throw rugs at the top or bottom of the stairs. If you do have throw rugs, attach them to the floor with carpet tape. ?Make sure that  you have a light switch at the top of the stairs and the bottom of the stairs. If you do not have them, ask someone to add them for you. ?What else can I do to help prevent falls? ?Wear shoes that: ?Do not have high heels. ?Have rubber bottoms. ?Are comfortable and fit you well. ?Are closed at the toe. Do not wear sandals. ?If you use a stepladder: ?Make sure that it is fully opened. Do not climb a closed stepladder. ?Make sure that both sides of the stepladder are locked into place. ?Ask someone to hold it for you, if possible. ?Clearly mark and make sure that you can see: ?Any grab bars or handrails. ?First and last steps. ?Where the edge of each step is. ?Use tools that help you move around (mobility aids) if they are  needed. These include: ?Canes. ?Walkers. ?Scooters. ?Crutches. ?Turn on the lights when you go into a dark area. Replace any light bulbs as soon as they burn out. ?Set up your furniture so you have a clear path. Avoi

## 2022-02-09 NOTE — Progress Notes (Signed)
? ?Subjective:  ? Christopher Huang is a 71 y.o. male who presents for Medicare Annual/Subsequent preventive examination. ? ?I connected with  [name] on 02/09/22 by a telephone enabled telemedicine application and verified that I am speaking with the correct person using two identifiers. ?  ?I discussed the limitations of evaluation and management by telemedicine. The patient expressed understanding and agreed to proceed. ? ?Patient location: home ? ?Provider location: tele-Health not in office ? ? ? ?Review of Systems    ?  ?Cardiac Risk Factors include: advanced age (>38mn, >>23women);male gender;obesity (BMI >30kg/m2);hypertension ? ?   ?Objective:  ?  ?Today's Vitals  ? ?There is no height or weight on file to calculate BMI. ? ?Advanced Directives 02/09/2022 02/07/2021 01/29/2021 12/03/2020 02/09/2020 01/31/2020 01/29/2020  ?Does Patient Have a Medical Advance Directive? No Yes Yes Yes Yes Yes Yes  ?Type of Advance Directive - [place] Healthcare Power of [place] - Living will;Healthcare Power of Attorney  ?Does patient want to make changes to medical advance directive? - - No - Patient declined - No - Patient declined - -  ?Copy of [place]? - Yes - validated most recent copy scanned in chart (See row information) Yes - validated most recent copy scanned in chart (See row information) - No - copy requested - No - copy requested  ?Would patient like information on creating a medical advance directive? No - Patient declined - - - - - -  ? ? ?Current Medications (verified) ?Outpatient Encounter Medications as of 02/09/2022  ?Medication Sig  ? Acetaminophen (ACETAMINOPHEN EXTRA STRENGTH) 500 MG capsule Take 1 capsule by mouth every 6 (six) hours as needed for fever.  ? ASPIRIN 81 PO Take 81 mg by mouth daily.  ? Cholecalciferol (VITAMIN D3) 50 MCG (2000 UT) TABS Take 2,000 Units by mouth daily.  ?  Coenzyme Q10 100 MG TABS Take 100 mg by mouth daily.  ? docusate sodium (COLACE) 100 MG capsule Take 100 mg by mouth daily.  ? fexofenadine (ALLEGRA ALLERGY) 180 MG tablet Take 1 tablet (180 mg total) by mouth daily.  ? fluticasone (FLONASE) 50 MCG/ACT nasal spray PLACE 2 SPRAYS INTO EACH NOSTRIL DAILY  ? gabapentin (NEURONTIN) 300 MG capsule Take 300 mg by mouth once. One 300 mg tablet once a day  at night  per patient  ? lisinopril (ZESTRIL) 10 MG tablet Take 1 tablet (10 mg total) by mouth every Monday, Tuesday, Wednesday, Thursday, and Friday.  ? benzonatate (TESSALON) 100 MG capsule Take 1 capsule (100 mg total) by mouth 2 (two) times daily as needed for cough.  ? benzonatate (TESSALON) 100 MG capsule Take 1 capsule (100 mg total) by mouth 3 (three) times daily as needed for cough.  ? guaiFENesin-codeine (CHERATUSSIN AC) 100-10 MG/5ML syrup Take 5 mLs by mouth every evening.  ? meloxicam (MOBIC) 7.5 MG tablet Take 1 tablet (7.5 mg total) by mouth daily.  ? Multiple Vitamin (MULTIVITAMIN WITH MINERALS) TABS tablet Take 1 tablet by mouth daily. Men's Multivitamin 50+  ? rosuvastatin (CRESTOR) 20 MG tablet Take 1 tablet (20 mg total) by mouth daily.  ? ?No facility-administered encounter medications on file as of 02/09/2022.  ? ? ?Allergies (verified) ?Percocet [oxycodone-acetaminophen], Tramadol, and Vicodin [hydrocodone-acetaminophen]  ? ?History: ?Past Medical History:  ?Diagnosis Date  ? Allergic rhinitis   ? [place]  ? s/p surgery  ? History  of kidney stones   ? H/O  ? Hyperlipidemia   ? Hypertension   ? Personal history of kidney stones   ? ?Past Surgical History:  ?Procedure Laterality Date  ? ANTERIOR LATERAL LUMBAR FUSION WITH PERCUTANEOUS SCREW 1 LEVEL N/A 01/29/2021  ? Procedure: L4-5 LATERAL INTERBODY FUSION, L4-5 POSTERIOR FUSION;  Surgeon: Meade Maw, MD;  Location: [facility] ORS;  Service: Neurosurgery;  Laterality: N/A;  ? basal skin cancers    ? CATARACT EXTRACTION, BILATERAL     ? 2021  ? COLONOSCOPY WITH PROPOFOL N/A 11/09/2017  ? Procedure: COLONOSCOPY WITH PROPOFOL;  Surgeon: Lucilla Lame, MD;  Location: [facility] ENDOSCOPY;  Service: Endoscopy;  Laterality: N/A;  ? EYE SURGERY Bilateral   ? Cataract  ? HAND SURGERY    ? THUMB SURGERY  ? KNEE ARTHROSCOPY    ? prostate cancer removed    ? TONSILLECTOMY    ? TONSILLECTOMY    ? TOTAL KNEE ARTHROPLASTY Left 02/09/2020  ? Procedure: TOTAL KNEE ARTHROPLASTY - RNFA;  Surgeon: Hessie Knows, MD;  Location: [facility] ORS;  Service: Orthopedics;  Laterality: Left;  ? ?Family History  ?Problem Relation Age of Onset  ? Dementia Mother   ? Prostate cancer Father   ? ?Social History  ? ?Socioeconomic History  ? Marital status: Married  ?  Spouse name: Not on file  ? Number of children: Not on file  ? Years of education: Not on file  ? Highest education level: High school graduate  ?Occupational History  ? Occupation: retired   ?Tobacco Use  ? Smoking status: Never  ? Smokeless tobacco: Never  ?Vaping Use  ? Vaping Use: Never used  ?Substance and Sexual Activity  ? Alcohol use: No  ? Drug use: No  ? Sexual activity: Yes  ?Other Topics Concern  ? Not on file  ?Social History Narrative  ? [place] and hunting   ? Meets with friends every morning for breakfast   ? Church   ? ?Social Determinants of Health  ? ?Financial Resource Strain: Low Risk   ? Difficulty of Paying Living Expenses: Not hard at all  ?Food Insecurity: No Food Insecurity  ? Worried About Charity fundraiser in the Last Year: Never true  ? Ran Out of Food in the Last Year: Never true  ?Transportation Needs: No Transportation Needs  ? Lack of Transportation (Medical): No  ? Lack of Transportation (Non-Medical): No  ?Physical Activity: Sufficiently Active  ? Days of Exercise per Week: 4 days  ? Minutes of Exercise per Session: 50 min  ?Stress: No Stress Concern Present  ? Feeling of Stress : Not at all  ?Social Connections: Moderately Integrated  ? Frequency of Communication with Friends and  Family: More than three times a week  ? Frequency of Social Gatherings with Friends and Family: More than three times a week  ? Attends Religious Services: More than 4 times per year  ? Active Member of Clubs or Organizations: No  ? Attends Archivist Meetings: Never  ? Marital Status: Married  ? ? ?Tobacco Counseling ?Counseling given: Not Answered ? ? ?Clinical Intake: ? ?Pre-visit preparation completed: Yes ? ?Pain : No/denies pain ? ?  ? ?Nutritional Risks: None ?Diabetes: No ? ?How often do you need to have someone help you when you read instructions, pamphlets, or other written materials from your doctor or pharmacy?: 1 - Never ? ?Diabetic?  no ? ?Interpreter Needed?: No ? ?Information entered by :: Leroy Kennedy LPN ? ? ?  Activities of Daily Living ?In your present state of health, do you have any difficulty performing the following activities: 02/09/2022  ?Hearing? N  ?Vision? N  ?Difficulty concentrating or making decisions? N  ?Walking or climbing stairs? N  ?Dressing or bathing? N  ?Doing errands, shopping? N  ?Preparing Food and eating ? N  ?Using the Toilet? N  ?In the past six months, have you accidently leaked urine? N  ?Do you have problems with loss of bowel control? N  ?Managing your Medications? N  ?Managing your Finances? N  ?Housekeeping or managing your Housekeeping? N  ?Some recent data might be hidden  ? ? ?Patient Care Team: ?Venita Lick, NP as PCP - General (Nurse Practitioner) ?Roseanne Kaufman, MD as Consulting Physician (Orthopedic Surgery) ?Lucilla Lame, MD as Consulting Physician (Gastroenterology) ? ?Indicate any recent Medical Services you may have received from other than [facility] providers in the past year (date may be approximate). ? ?   ?Assessment:  ? This is a routine wellness examination for [facility]. ? ?Hearing/Vision screen ?Hearing Screening - Comments:: No trouble hearing ?Vision Screening - Comments:: Up to date ?[place] ? ?Dietary issues and exercise activities  discussed: ?Current Exercise Habits: Home exercise routine, Type of exercise: walking (boating, yard, work, Location manager , fishing), Time (Minutes): 40, Frequency (Times/Week): 4, Weekly Exercise (Minutes/Week): 160, I

## 2022-02-15 ENCOUNTER — Other Ambulatory Visit: Payer: Self-pay | Admitting: Nurse Practitioner

## 2022-02-15 DIAGNOSIS — J302 Other seasonal allergic rhinitis: Secondary | ICD-10-CM

## 2022-02-17 NOTE — Telephone Encounter (Signed)
Requested medication (s) are due for refill today: Yes ? ?Requested medication (s) are on the active medication list: Yes ? ?Last refill:  01/24/21 ? ?Future visit scheduled: Yes ? ?Notes to clinic:  Not delegated, prescription expired. ? ? ? ?Requested Prescriptions  ?Pending Prescriptions Disp Refills  ? fluticasone (FLONASE) 50 MCG/ACT nasal spray [Pharmacy Med Name: FLUTICASONE PROP 50 MCG SPRAY] 48 mL 3  ?  Sig: PLACE 2 SPRAYS INTO EACH NOSTRIL DAILY  ?  ? Not Delegated - Ear, Nose, and Throat: Nasal Preparations - Corticosteroids Failed - 02/15/2022  9:29 AM  ?  ?  Failed - This refill cannot be delegated  ?  ?  Passed - Valid encounter within last 12 months  ?  Recent Outpatient Visits   ? ?      ? 2 months ago Acute sinusitis, recurrence not specified, unspecified location  ? [facility] Vigg, Avanti, MD  ? 2 months ago Primary hypertension  ? [facility] Vigg, Avanti, MD  ? 4 months ago Primary hypertension  ? [facility] [place], [place] T, NP  ? 11 months ago Primary hypertension  ? [place], NP  ? 11 months ago Suspected COVID-19 virus infection  ? [facility], Lauren A, NP  ? ?  ?  ?Future Appointments   ? ?        ? In 1 month Cannady, Barbaraann Faster, NP [employer]  ? ?  ? ?  ?  ?  ? ?

## 2022-03-22 NOTE — Patient Instructions (Addendum)
- Start Amlodipine 5 MG daily for blood pressure, continue Lisinopril as ordered. ?- Start Aricept 5 MG daily for memory. ?- I have placed a referral to neurology and cardiology. ?- I have ordered imaging to include and MRI of the brain (I ordered some Xanax for you to take prior to this) and an ultrasound of your carotids (the vessels around neck area that feed your brain) ? ?High Cholesterol ? ?High cholesterol is a condition in which the blood has high levels of a white, waxy substance similar to fat (cholesterol). The liver makes all the cholesterol that the body needs. The human body needs small amounts of cholesterol to help build cells. A person gets extra or excess cholesterol from the food that he or she eats. ?The blood carries cholesterol from the liver to the rest of the body. If you have high cholesterol, deposits (plaques) may build up on the walls of your arteries. Arteries are the blood vessels that carry blood away from your heart. These plaques make the arteries narrow and stiff. ?Cholesterol plaques increase your risk for heart attack and stroke. Work with your health care provider to keep your cholesterol levels in a healthy range. ?What increases the risk? ?The following factors may make you more likely to develop this condition: ?Eating foods that are high in animal fat (saturated fat) or cholesterol. ?Being overweight. ?Not getting enough exercise. ?A family history of high cholesterol (familial hypercholesterolemia). ?Use of tobacco products. ?Having diabetes. ?What are the signs or symptoms? ?In most cases, high cholesterol does not usually cause any symptoms. ?In severe cases, very high cholesterol levels can cause: ?Fatty bumps under the skin (xanthomas). ?A white or gray ring around the black center (pupil) of the eye. ?How is this diagnosed? ?This condition may be diagnosed based on the results of a blood test. ?If you are older than 72 years of age, your health care provider may check  your cholesterol levels every 4-6 years. ?You may be checked more often if you have high cholesterol or other risk factors for heart disease. ?The blood test for cholesterol measures: ?"Bad" cholesterol, or LDL cholesterol. This is the main type of cholesterol that causes heart disease. The desired level is less than 100 mg/dL (2.59 mmol/L). ?"Good" cholesterol, or HDL cholesterol. HDL helps protect against heart disease by cleaning the arteries and carrying the LDL to the liver for processing. The desired level for HDL is 60 mg/dL (1.55 mmol/L) or higher. ?Triglycerides. These are fats that your body can store or burn for energy. The desired level is less than 150 mg/dL (1.69 mmol/L). ?Total cholesterol. This measures the total amount of cholesterol in your blood and includes LDL, HDL, and triglycerides. The desired level is less than 200 mg/dL (5.17 mmol/L). ?How is this treated? ?Treatment for high cholesterol starts with lifestyle changes, such as diet and exercise. ?Diet changes. You may be asked to eat foods that have more fiber and less saturated fats or added sugar. ?Lifestyle changes. These may include regular exercise, maintaining a healthy weight, and quitting use of tobacco products. ?Medicines. These are given when diet and lifestyle changes have not worked. You may be prescribed a statin medicine to help lower your cholesterol levels. ?Follow these instructions at home: ?Eating and drinking ? ?Eat a healthy, balanced diet. This diet includes: ?Daily servings of a variety of fresh, frozen, or canned fruits and vegetables. ?Daily servings of whole grain foods that are rich in fiber. ?Foods that are low in saturated  fats and trans fats. These include poultry and fish without skin, lean cuts of meat, and low-fat dairy products. ?A variety of fish, especially oily fish that contain omega-3 fatty acids. Aim to eat fish at least 2 times a week. ?Avoid foods and drinks that have added sugar. ?Use healthy  cooking methods, such as roasting, grilling, broiling, baking, poaching, steaming, and stir-frying. Do not fry your food except for stir-frying. ?If you drink alcohol: ?Limit how much you have to: ?0-1 drink a day for women who are not pregnant. ?0-2 drinks a day for men. ?Know how much alcohol is in a drink. In the [place] one drink equals one 12 oz bottle of beer (355 mL), one 5 oz glass of wine (148 mL), or one 1? oz glass of hard liquor (44 mL). ?Lifestyle ? ?Get regular exercise. Aim to exercise for a total of 150 minutes a week. Increase your activity level by doing activities such as gardening, walking, and taking the stairs. ?Do not use any products that contain nicotine or tobacco. These products include cigarettes, chewing tobacco, and vaping devices, such as e-cigarettes. If you need help quitting, ask your health care provider. ?General instructions ?Take over-the-counter and prescription medicines only as told by your health care provider. ?Keep all follow-up visits. This is important. ?Where to find more information ?American Heart Association: www.heart.org ?National Heart, Lung, and Blood Institute: https://wilson-eaton.com/ ?Contact a health care provider if: ?You have trouble achieving or maintaining a healthy diet or weight. ?You are starting an exercise program. ?You are unable to stop smoking. ?Get help right away if: ?You have chest pain. ?You have trouble breathing. ?You have discomfort or pain in your jaw, neck, back, shoulder, or arm. ?You have any symptoms of a stroke. "BE FAST" is an easy way to remember the main warning signs of a stroke: ?B - Balance. Signs are dizziness, sudden trouble walking, or loss of balance. ?E - Eyes. Signs are trouble seeing or a sudden change in vision. ?F - Face. Signs are sudden weakness or numbness of the face, or the face or eyelid drooping on one side. ?A - Arms. Signs are weakness or numbness in an arm. This happens suddenly and usually on one side of the body. ?S  - Speech. Signs are sudden trouble speaking, slurred speech, or trouble understanding what people say. ?T - Time. Time to call emergency services. Write down what time symptoms started. ?You have other signs of a stroke, such as: ?A sudden, severe headache with no known cause. ?Nausea or vomiting. ?Seizure. ?These symptoms may represent a serious problem that is an emergency. Do not wait to see if the symptoms will go away. Get medical help right away. Call your local emergency services (911 in the [place] Do not drive yourself to the hospital. ?Summary ?Cholesterol plaques increase your risk for heart attack and stroke. Work with your health care provider to keep your cholesterol levels in a healthy range. ?Eat a healthy, balanced diet, get regular exercise, and maintain a healthy weight. ?Do not use any products that contain nicotine or tobacco. These products include cigarettes, chewing tobacco, and vaping devices, such as e-cigarettes. ?Get help right away if you have any symptoms of a stroke. ?This information is not intended to replace advice given to you by your health care provider. Make sure you discuss any questions you have with your health care provider. ?Document Revised: 01/23/2021 Document Reviewed: 01/13/2021 ?Elsevier Patient Education ? [place]. ? ?

## 2022-03-27 ENCOUNTER — Ambulatory Visit (INDEPENDENT_AMBULATORY_CARE_PROVIDER_SITE_OTHER): Payer: PPO | Admitting: Nurse Practitioner

## 2022-03-27 ENCOUNTER — Encounter: Payer: Self-pay | Admitting: Nurse Practitioner

## 2022-03-27 VITALS — BP 162/76 | HR 71 | Temp 98.1°F | Ht 73.5 in | Wt 228.2 lb

## 2022-03-27 DIAGNOSIS — Z9889 Other specified postprocedural states: Secondary | ICD-10-CM | POA: Diagnosis not present

## 2022-03-27 DIAGNOSIS — J301 Allergic rhinitis due to pollen: Secondary | ICD-10-CM

## 2022-03-27 DIAGNOSIS — E669 Obesity, unspecified: Secondary | ICD-10-CM | POA: Diagnosis not present

## 2022-03-27 DIAGNOSIS — I499 Cardiac arrhythmia, unspecified: Secondary | ICD-10-CM | POA: Insufficient documentation

## 2022-03-27 DIAGNOSIS — E782 Mixed hyperlipidemia: Secondary | ICD-10-CM

## 2022-03-27 DIAGNOSIS — R413 Other amnesia: Secondary | ICD-10-CM | POA: Diagnosis not present

## 2022-03-27 DIAGNOSIS — Z0000 Encounter for general adult medical examination without abnormal findings: Secondary | ICD-10-CM

## 2022-03-27 DIAGNOSIS — I10 Essential (primary) hypertension: Secondary | ICD-10-CM

## 2022-03-27 DIAGNOSIS — Z8546 Personal history of malignant neoplasm of prostate: Secondary | ICD-10-CM | POA: Diagnosis not present

## 2022-03-27 DIAGNOSIS — E559 Vitamin D deficiency, unspecified: Secondary | ICD-10-CM

## 2022-03-27 DIAGNOSIS — F0280 Dementia in other diseases classified elsewhere without behavioral disturbance: Secondary | ICD-10-CM | POA: Insufficient documentation

## 2022-03-27 LAB — URINALYSIS, ROUTINE W REFLEX MICROSCOPIC
Bilirubin, UA: NEGATIVE
Glucose, UA: NEGATIVE
Ketones, UA: NEGATIVE
Leukocytes,UA: NEGATIVE
Nitrite, UA: NEGATIVE
Protein,UA: NEGATIVE
RBC, UA: NEGATIVE
Specific Gravity, UA: 1.025 (ref 1.005–1.030)
Urobilinogen, Ur: 0.2 mg/dL (ref 0.2–1.0)
pH, UA: 7 (ref 5.0–7.5)

## 2022-03-27 MED ORDER — LISINOPRIL 10 MG PO TABS: 10.0000 mg | ORAL_TABLET | ORAL | 4 refills | Status: DC

## 2022-03-27 MED ORDER — ROSUVASTATIN CALCIUM 20 MG PO TABS: 20.0000 mg | ORAL_TABLET | Freq: Every day | ORAL | 4 refills | Status: DC

## 2022-03-27 MED ORDER — DONEPEZIL HCL 5 MG PO TABS: 5.0000 mg | ORAL_TABLET | Freq: Every day | ORAL | 4 refills | Status: DC

## 2022-03-27 MED ORDER — AMLODIPINE BESYLATE 5 MG PO TABS: 5.0000 mg | ORAL_TABLET | Freq: Every day | ORAL | 4 refills | Status: DC

## 2022-03-27 MED ORDER — ALPRAZOLAM 0.5 MG PO TABS: ORAL_TABLET | ORAL | 0 refills | Status: DC

## 2022-03-27 NOTE — Assessment & Plan Note (Signed)
Chronic, stable.  Continue current medication regimen and adjust as needed. ?

## 2022-03-27 NOTE — Assessment & Plan Note (Signed)
PSA today and recent remains stable. ?

## 2022-03-27 NOTE — Assessment & Plan Note (Signed)
New onset over past year.  Previous 6CIT = 2 and today 14.  Will obtain MRI imaging brain due to risk factors with HLD and HTN.  Start a low dose Aricept 5 MG and referral to neurology placed.  Will also obtain doppler carotids to assess for any blockages.  Check labs today.  Return in 4 weeks. ?

## 2022-03-27 NOTE — Assessment & Plan Note (Signed)
Noted on EKG -- some sinus brady and LBBB -- due to memory changes will get in with cardiology to further assess for recommendations. ?

## 2022-03-27 NOTE — Assessment & Plan Note (Addendum)
Chronic, ongoing.  Continue Rosuvastatin daily.  Adjust dose as needed.  Obtain labs today: CMP and Lipid.   

## 2022-03-27 NOTE — Assessment & Plan Note (Addendum)
Chronic, ongoing with BP above goal today.  Continue Lisinopril 10 MG on M-T-W-TH-Fri + add on Amlodipine 5 MG daily (?some ISH present).  Recommend he monitor BP at least a few mornings a week at home and document.  DASH diet at home.  Labs today: CBC, CMP, urine, TSH.   ? ?

## 2022-03-27 NOTE — Assessment & Plan Note (Signed)
Chronic, stable.  Continue Gabapentin and recommend he stop Meloxicam, use Tylenol if needed for pain.  Continue to collaborate with neurosurgery. ?

## 2022-03-27 NOTE — Assessment & Plan Note (Signed)
Ongoing and taking supplement at home, continue this and check Vit D today. 

## 2022-03-27 NOTE — Progress Notes (Signed)
? ?BP (!) 162/76 (BP Location: Left Arm, Patient Position: Sitting, Cuff Size: Normal)   Pulse 71   Temp 98.1 ?F (36.7 ?C) (Oral)   Ht 6' 1.5" (1.867 m)   Wt 228 lb 3.2 oz (103.5 kg)   SpO2 95%   BMI 29.70 kg/m?   ? ?Subjective:  ? ? Patient ID: Christopher Huang, male    DOB: 1951/11/20, 71 y.o.   MRN: [id number] ? ?HPI: ?Christopher Huang is a 71 y.o. male presenting on 03/27/2022 for comprehensive medical examination. Current medical complaints include: memory changes ? ?He currently lives with: wife ?Interim Problems from his last visit: yes ? ?MEMORY CHANGES: ?His wife reports that there are some memory changes she has noticed.  Exampled this morning she asked him something and he did not know what she said.  He reports no changes in daily routine -- meets over at [place] with his friends almost every morning.  His friends have not reported any changes in his memory.  He denies difficulty getting from point A to point B.  Is able to dress self, cook for self, clean house.  No family history of dementia.  Did have a fall and hit head on floor a month ago while vacuuming. ? ?Was in [employer] for 2 years, no time overseas.  During [place] War he served, but did not go overseas -- no [employer] exposure. ? ?  03/27/2022  ?  8:22 AM 02/07/2021  ?  3:23 PM 01/26/2019  ? 10:03 AM 11/12/2017  ?  2:29 PM  ?6CIT Screen  ?What Year? 0 points 0 points 0 points 0 points  ?What month? 0 points 0 points 0 points 0 points  ?What time? 0 points 0 points 0 points 0 points  ?Count back from 20 0 points 0 points 0 points 0 points  ?Months in reverse 4 points 2 points 0 points 0 points  ?Repeat phrase 10 points 0 points 0 points 0 points  ?Total Score 14 points 2 points 0 points 0 points  ? ?HYPERTENSION / HYPERLIPIDEMIA ?Continues on Lisinopril and Rosuvastatin. ?  ?History of prostate cancer -- 2008 -- had complete removal of prostate == PSA in May 0.2.   History of back surgery 01/29/21 -- Dr. Izora Ribas, continues on Gabapentin and  Meloxicam -- 01/08/22 was last visit -- continues on Vitamin D daily. ?Satisfied with current treatment? yes ?Duration of hypertension: chronic ?BP monitoring frequency: not checking ?BP range:  ?BP medication side effects: no ?Past BP meds:  ?Duration of hyperlipidemia: chronic ?Cholesterol medication side effects: no ?Cholesterol supplements: none ?Past cholesterol medications:  ?Medication compliance: good compliance ?Aspirin: yes ?Recent stressors: no ?Recurrent headaches: no ?Visual changes: no ?Palpitations: no ?Dyspnea: no ?Chest pain: no ?Lower extremity edema: no ?Dizzy/lightheaded: no  ? ?Functional Status Survey: ?Is the patient deaf or have difficulty hearing?: No ?Does the patient have difficulty seeing, even when wearing glasses/contacts?: No ?Does the patient have difficulty concentrating, remembering, or making decisions?: No ?Does the patient have difficulty walking or climbing stairs?: No ?Does the patient have difficulty dressing or bathing?: No ?Does the patient have difficulty doing errands alone such as visiting a doctor's office or shopping?: No ? ?FALL RISK: ? ?  03/27/2022  ?  8:27 AM 03/27/2022  ?  8:09 AM 02/09/2022  ? 12:07 PM 02/07/2021  ?  3:21 PM 11/26/2020  ? 10:59 AM  ?Fall Risk   ?Falls in the past year? '1 1 1 '$ 0 0  ?Number falls  in past yr: 1 0 0  0  ?Injury with Fall? 0 0 0    ?Comment   xray done no injury    ?Risk for fall due to : History of fall(s) History of fall(s)  Medication side effect   ?Follow up Falls evaluation completed Falls evaluation completed Falls evaluation completed;Education provided;Falls prevention discussed Falls evaluation completed;Education provided;Falls prevention discussed   ? ? ?Depression Screen ? ?  03/27/2022  ?  8:04 AM 02/09/2022  ? 12:09 PM 09/26/2021  ? 10:12 AM 03/24/2021  ?  2:26 PM 02/07/2021  ?  3:21 PM  ?Depression screen PHQ 2/9  ?Decreased Interest 0 0 0 0 0  ?Down, Depressed, Hopeless 0 0 0 0 0  ?PHQ - 2 Score 0 0 0 0 0  ?Altered sleeping 0  0     ?Tired, decreased energy 0  0    ?Change in appetite 0  0    ?Feeling bad or failure about yourself  0  0    ?Trouble concentrating 0  0    ?Moving slowly or fidgety/restless 0  0    ?Suicidal thoughts 0  0    ?PHQ-9 Score 0  0    ?Difficult doing work/chores Not difficult at all  Not difficult at all    ? ? ?Advanced Directives ?<no information> ? ?Past Medical History:  ?Past Medical History:  ?Diagnosis Date  ? Allergic rhinitis   ? Cancer of prostate ([place]) 4/08  ? s/p surgery  ? History of kidney stones   ? H/O  ? Hyperlipidemia   ? Hypertension   ? Personal history of kidney stones   ? ? ?Surgical History:  ?Past Surgical History:  ?Procedure Laterality Date  ? ANTERIOR LATERAL LUMBAR FUSION WITH PERCUTANEOUS SCREW 1 LEVEL N/A 01/29/2021  ? Procedure: L4-5 LATERAL INTERBODY FUSION, L4-5 POSTERIOR FUSION;  Surgeon: Meade Maw, MD;  Location: [facility] ORS;  Service: Neurosurgery;  Laterality: N/A;  ? basal skin cancers    ? CATARACT EXTRACTION, BILATERAL    ? 2021  ? COLONOSCOPY WITH PROPOFOL N/A 11/09/2017  ? Procedure: COLONOSCOPY WITH PROPOFOL;  Surgeon: Lucilla Lame, MD;  Location: [facility] ENDOSCOPY;  Service: Endoscopy;  Laterality: N/A;  ? EYE SURGERY Bilateral   ? Cataract  ? HAND SURGERY    ? THUMB SURGERY  ? KNEE ARTHROSCOPY    ? prostate cancer removed    ? TONSILLECTOMY    ? TONSILLECTOMY    ? TOTAL KNEE ARTHROPLASTY Left 02/09/2020  ? Procedure: TOTAL KNEE ARTHROPLASTY - RNFA;  Surgeon: Hessie Knows, MD;  Location: [facility] ORS;  Service: Orthopedics;  Laterality: Left;  ? ? ?Medications:  ?Current Outpatient Medications on File Prior to Visit  ?Medication Sig  ? Acetaminophen (ACETAMINOPHEN EXTRA STRENGTH) 500 MG capsule Take 1 capsule by mouth every 6 (six) hours as needed for fever.  ? ASPIRIN 81 PO Take 81 mg by mouth daily.  ? Cholecalciferol (VITAMIN D3) 50 MCG (2000 UT) TABS Take 2,000 Units by mouth daily.  ? Coenzyme Q10 100 MG TABS Take 100 mg by mouth daily.  ? docusate sodium (COLACE) 100 MG  capsule Take 100 mg by mouth daily.  ? fexofenadine (ALLEGRA ALLERGY) 180 MG tablet Take 1 tablet (180 mg total) by mouth daily.  ? fluticasone (FLONASE) 50 MCG/ACT nasal spray PLACE 2 SPRAYS INTO EACH NOSTRIL DAILY.  ? gabapentin (NEURONTIN) 300 MG capsule Take 300 mg by mouth once. One 300 mg tablet once a day  at night  per patient  ? Multiple Vitamin (MULTIVITAMIN WITH MINERALS) TABS tablet Take 1 tablet by mouth daily. Men's Multivitamin 50+  ? SHINGRIX injection Inject 0.5 mLs into the muscle once.  ? ?No current facility-administered medications on file prior to visit.  ? ? ?Allergies:  ?Allergies  ?Allergen Reactions  ? Percocet [Oxycodone-Acetaminophen] Nausea And Vomiting  ? Tramadol Nausea And Vomiting  ? Vicodin [Hydrocodone-Acetaminophen] Nausea And Vomiting  ? ? ?Social History:  ?Social History  ? ?Socioeconomic History  ? Marital status: Married  ?  Spouse name: Not on file  ? Number of children: Not on file  ? Years of education: Not on file  ? Highest education level: High school graduate  ?Occupational History  ? Occupation: retired   ?Tobacco Use  ? Smoking status: Never  ? Smokeless tobacco: Never  ?Vaping Use  ? Vaping Use: Never used  ?Substance and Sexual Activity  ? Alcohol use: No  ? Drug use: No  ? Sexual activity: Yes  ?Other Topics Concern  ? Not on file  ?Social History Narrative  ? [place] and hunting   ? Meets with friends every morning for breakfast   ? Church   ? ?Social Determinants of Health  ? ?Financial Resource Strain: Low Risk   ? Difficulty of Paying Living Expenses: Not hard at all  ?Food Insecurity: No Food Insecurity  ? Worried About Charity fundraiser in the Last Year: Never true  ? Ran Out of Food in the Last Year: Never true  ?Transportation Needs: No Transportation Needs  ? Lack of Transportation (Medical): No  ? Lack of Transportation (Non-Medical): No  ?Physical Activity: Sufficiently Active  ? Days of Exercise per Week: 4 days  ? Minutes of Exercise per  Session: 50 min  ?Stress: No Stress Concern Present  ? Feeling of Stress : Not at all  ?Social Connections: Moderately Integrated  ? Frequency of Communication with Friends and Family: More than three times a week  ?

## 2022-03-29 ENCOUNTER — Other Ambulatory Visit: Payer: Self-pay | Admitting: Nurse Practitioner

## 2022-03-29 MED ORDER — ROSUVASTATIN CALCIUM 40 MG PO TABS: 40.0000 mg | ORAL_TABLET | Freq: Every day | ORAL | 4 refills | Status: DC

## 2022-03-29 NOTE — Progress Notes (Signed)
Contacted via [place] ? ? ?Good afternoon [name], your labs have returned: ?- Kidney function, creatinine and eGFR, remains normal, as is liver function, AST and ALT.   ?- Cholesterol labs show stable LDL level, but triglycerides remain a little elevated.  I recommend we increase Rosuvastatin to 40 MG daily and stop 20 MG to help lower levels further and prevent stroke or heart attack.  If you have 20 MG tablets, start taking 2 of these a day and I will then send in new 40 MG pills. ?- CBC shows no anemia or infection. ?- Thyroid and prostate labs normal. ?- Awaiting Vitamin D level, B12 level and syphilis are normal ranges.  Any questions? ?Keep being stellar!!  Thank you for allowing me to participate in your care.  I appreciate you. ?Kindest regards, ?Brentt Fread ?

## 2022-03-31 LAB — COMPREHENSIVE METABOLIC PANEL
ALT: 20 IU/L (ref 0–44)
AST: 21 IU/L (ref 0–40)
Albumin/Globulin Ratio: 2.3 — ABNORMAL HIGH (ref 1.2–2.2)
Albumin: 4.5 g/dL (ref 3.7–4.7)
Alkaline Phosphatase: 97 IU/L (ref 44–121)
BUN/Creatinine Ratio: 15 (ref 10–24)
BUN: 15 mg/dL (ref 8–27)
Bilirubin Total: 0.3 mg/dL (ref 0.0–1.2)
CO2: 22 mmol/L (ref 20–29)
Calcium: 9.1 mg/dL (ref 8.6–10.2)
Chloride: 104 mmol/L (ref 96–106)
Creatinine, Ser: 0.97 mg/dL (ref 0.76–1.27)
Globulin, Total: 2 g/dL (ref 1.5–4.5)
Glucose: 102 mg/dL — ABNORMAL HIGH (ref 70–99)
Potassium: 4.5 mmol/L (ref 3.5–5.2)
Sodium: 144 mmol/L (ref 134–144)
Total Protein: 6.5 g/dL (ref 6.0–8.5)
eGFR: 83 mL/min/{1.73_m2} (ref >59)

## 2022-03-31 LAB — LIPID PANEL W/O CHOL/HDL RATIO
Cholesterol, Total: 163 mg/dL (ref 100–199)
HDL: 57 mg/dL (ref >39)
LDL Chol Calc (NIH): 77 mg/dL (ref 0–99)
Triglycerides: 169 mg/dL — ABNORMAL HIGH (ref 0–149)
VLDL Cholesterol Cal: 29 mg/dL (ref 5–40)

## 2022-03-31 LAB — CBC WITH DIFFERENTIAL/PLATELET
Basophils Absolute: 0.1 10*3/uL (ref 0.0–0.2)
Basos: 2 %
EOS (ABSOLUTE): 0.3 10*3/uL (ref 0.0–0.4)
Eos: 8 %
Hematocrit: 45.2 % (ref 37.5–51.0)
Hemoglobin: 15.4 g/dL (ref 13.0–17.7)
Immature Grans (Abs): 0 10*3/uL (ref 0.0–0.1)
Immature Granulocytes: 0 %
Lymphocytes Absolute: 1.1 10*3/uL (ref 0.7–3.1)
Lymphs: 24 %
MCH: 31.1 pg (ref 26.6–33.0)
MCHC: 34.1 g/dL (ref 31.5–35.7)
MCV: 91 fL (ref 79–97)
Monocytes Absolute: 0.3 10*3/uL (ref 0.1–0.9)
Monocytes: 7 %
Neutrophils Absolute: 2.7 10*3/uL (ref 1.4–7.0)
Neutrophils: 59 %
Platelets: 219 10*3/uL (ref 150–450)
RBC: 4.95 x10E6/uL (ref 4.14–5.80)
RDW: 13.1 % (ref 11.6–15.4)
WBC: 4.5 10*3/uL (ref 3.4–10.8)

## 2022-03-31 LAB — VITAMIN B12: Vitamin B-12: 462 pg/mL (ref 232–1245)

## 2022-03-31 LAB — PSA: Prostate Specific Ag, Serum: 0.2 ng/mL (ref 0.0–4.0)

## 2022-03-31 LAB — VITAMIN D 25 HYDROXY (VIT D DEFICIENCY, FRACTURES)

## 2022-03-31 LAB — TSH: TSH: 1.82 u[IU]/mL (ref 0.450–4.500)

## 2022-03-31 LAB — RPR: RPR Ser Ql: NONREACTIVE

## 2022-04-01 ENCOUNTER — Telehealth: Payer: Self-pay | Admitting: Nurse Practitioner

## 2022-04-01 NOTE — Telephone Encounter (Signed)
Copied from [place] [id number]. Topic: General - Other ?>> Apr 01, 2022  9:43 AM Pawlus, Brayton Layman A wrote: ?Reason for CRM: Pts wife called in requested to speak directly with [facility] regarding referrals, please advise. ?>> Apr 01, 2022 10:53 AM Erick Blinks wrote: ?Pt's wife called and asked if Denyse Amass could call her tomorrow instead of today  ?

## 2022-04-02 DIAGNOSIS — C44319 Basal cell carcinoma of skin of other parts of face: Secondary | ICD-10-CM | POA: Diagnosis not present

## 2022-04-09 DIAGNOSIS — C44519 Basal cell carcinoma of skin of other part of trunk: Secondary | ICD-10-CM | POA: Diagnosis not present

## 2022-04-09 DIAGNOSIS — C44619 Basal cell carcinoma of skin of left upper limb, including shoulder: Secondary | ICD-10-CM | POA: Diagnosis not present

## 2022-04-15 ENCOUNTER — Ambulatory Visit
Admission: RE | Admit: 2022-04-15 | Discharge: 2022-04-15 | Disposition: A | Discharge status: Home / Self Care | Payer: PPO | Source: Ambulatory Visit | Attending: Nurse Practitioner | Admitting: Nurse Practitioner

## 2022-04-15 DIAGNOSIS — R413 Other amnesia: Secondary | ICD-10-CM

## 2022-04-15 DIAGNOSIS — G319 Degenerative disease of nervous system, unspecified: Secondary | ICD-10-CM | POA: Diagnosis not present

## 2022-04-15 DIAGNOSIS — R519 Headache, unspecified: Secondary | ICD-10-CM | POA: Diagnosis not present

## 2022-04-15 DIAGNOSIS — E782 Mixed hyperlipidemia: Secondary | ICD-10-CM | POA: Diagnosis not present

## 2022-04-15 DIAGNOSIS — I6523 Occlusion and stenosis of bilateral carotid arteries: Secondary | ICD-10-CM | POA: Diagnosis not present

## 2022-04-15 DIAGNOSIS — I10 Essential (primary) hypertension: Secondary | ICD-10-CM | POA: Diagnosis not present

## 2022-04-15 IMAGING — MR MR HEAD W/O CM
12 series · 48 of 48 positions shown · non-contrast
Comparison: None Available.

CLINICAL DATA: Memory loss.  Fall 4 weeks ago with headache.

EXAM:
MRI HEAD WITHOUT CONTRAST
TECHNIQUE: Multiplanar, multiecho pulse sequences of the brain and surrounding
structures were obtained without intravenous contrast.

[Series 5: ax dwi_tracew · axial · 3.0mm · 0.65mm/px · z∈[-88,+65]mm · 4 of 48 slices shown]
[im 1/48]
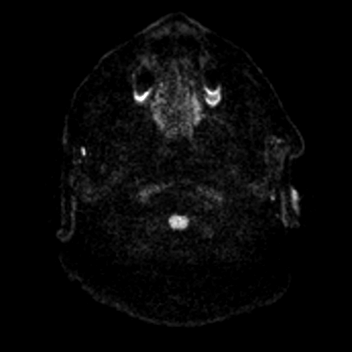
[im 16/48]
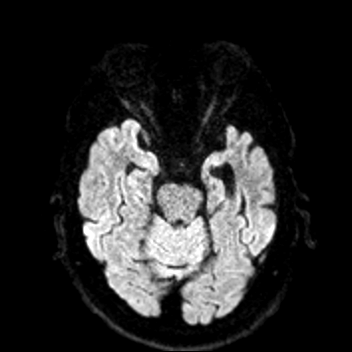
[im 32/48]
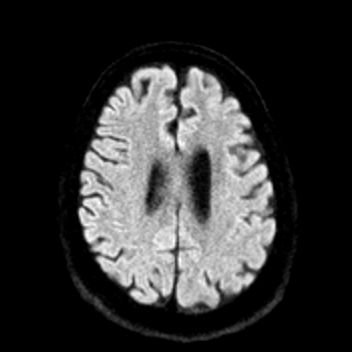
[im 48/48]
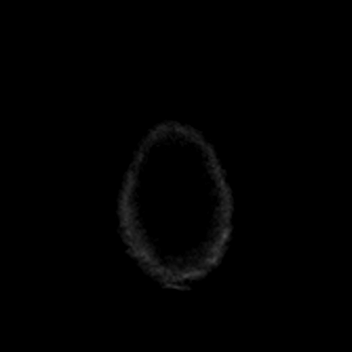

[Series 6: ax dwi_adc · axial · 3.0mm · 0.65mm/px · z∈[-88,+65]mm · 3 of 48 slices shown]
[im 1/48]
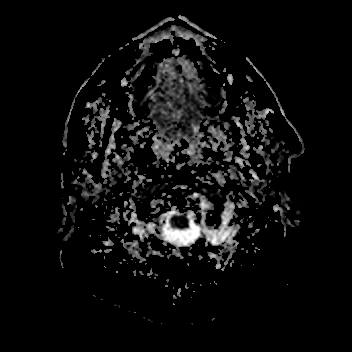
[im 24/48]
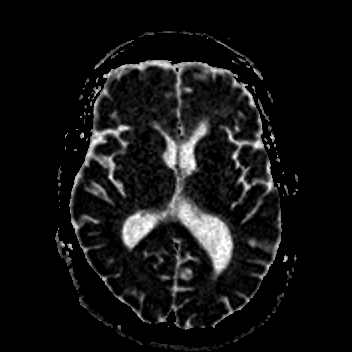
[im 48/48]
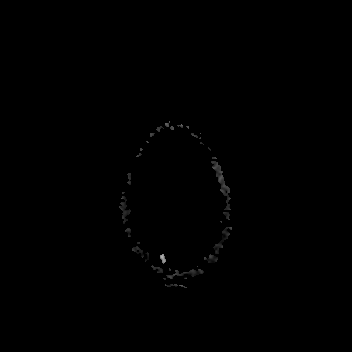

[Series 7: cor dwi_tracew · coronal · 5.0mm · 0.65mm/px · 3 of 40 slices shown]
[im 1/40]
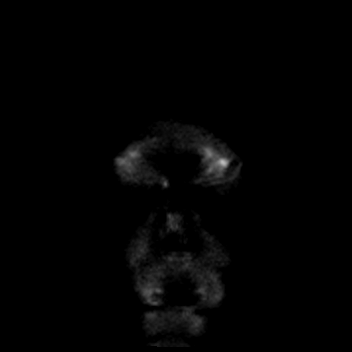
[im 20/40]
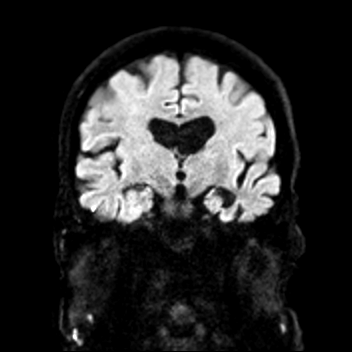
[im 40/40]
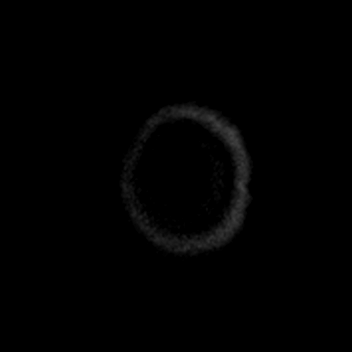

[Series 8: cor dwi_adc · coronal · 5.0mm · 0.65mm/px · 3 of 40 slices shown]
[im 1/40]
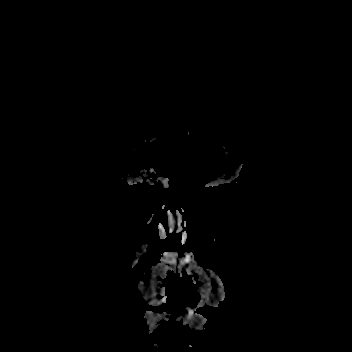
[im 20/40]
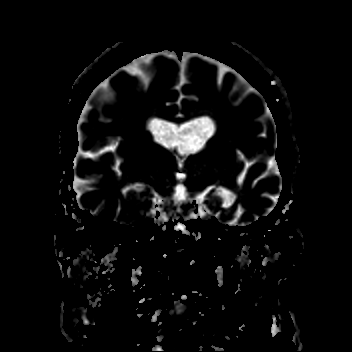
[im 40/40]
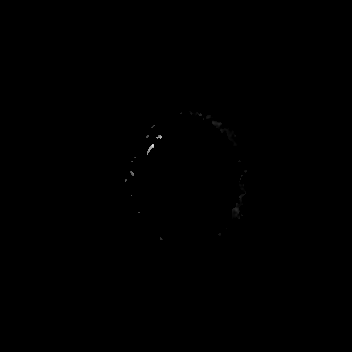

[Series 9: T1 · sagittal · 5.0mm · 0.62mm/px · 2 of 23 slices shown (1 of 2)]
[im 1/23]
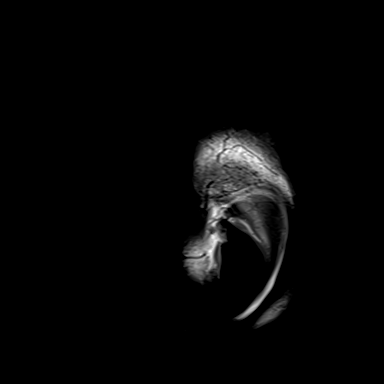
[im 23/23]
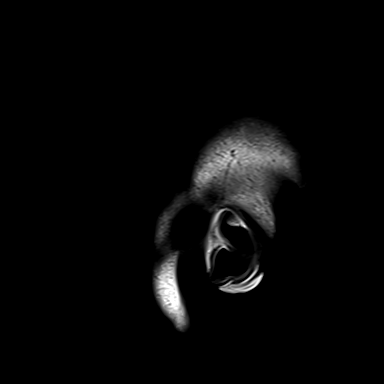

[Series 10: T2 · axial · 5.0mm · 0.53mm/px · z∈[-82,+60]mm · 2 of 25 slices shown (1 of 2)]
[im 1/25]
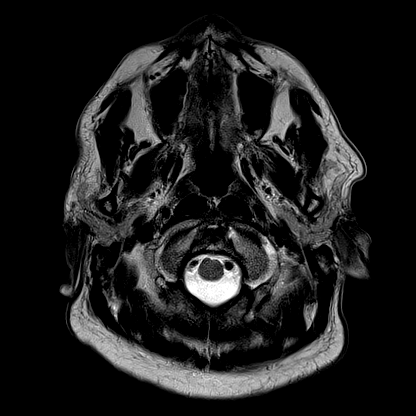
[im 25/25]
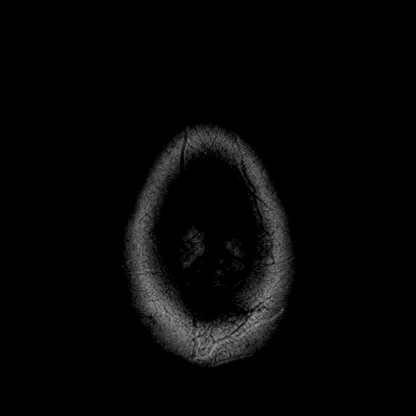

[Series 11: mag_images · axial · 3.0mm · 0.90mm/px · z∈[-99,+76]mm · 4 of 60 slices shown]
[im 1/60]
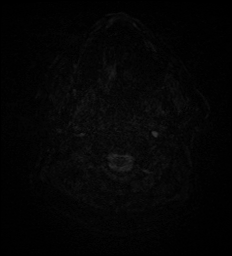
[im 20/60]
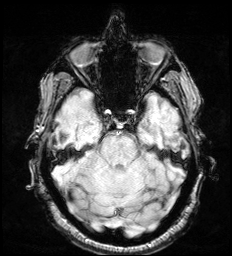
[im 40/60]
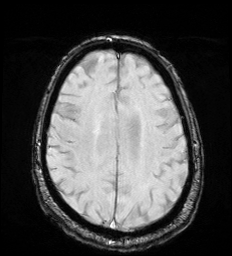
[im 60/60]
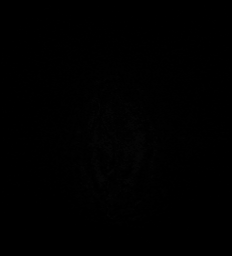

[Series 12: pha_images · axial · 3.0mm · 0.90mm/px · z∈[-99,+73]mm · 4 of 59 slices shown]
[im 1/59]
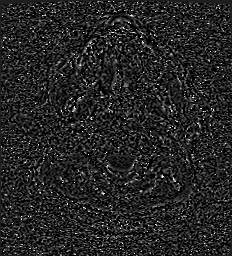
[im 20/59]
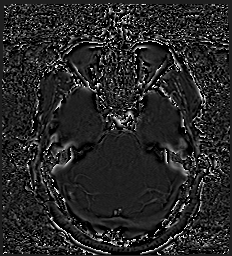
[im 39/59]
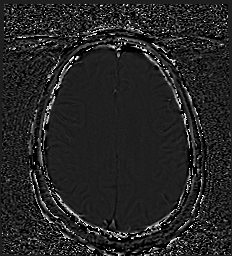
[im 59/59]
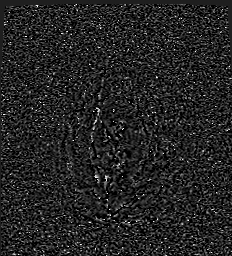

[Series 13: swi_images · axial · 3.0mm · 0.90mm/px · z∈[-99,+76]mm · 4 of 60 slices shown]
[im 1/60]
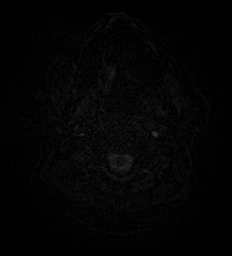
[im 20/60]
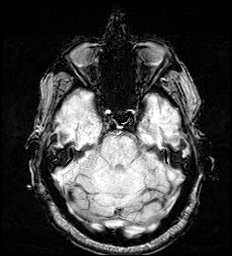
[im 40/60]
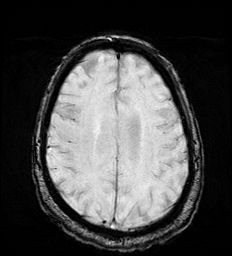
[im 60/60]
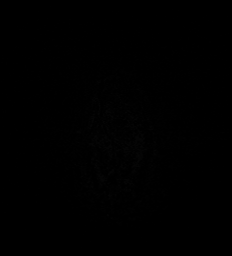

[Series 15: FLAIR · axial · 3.0mm · 0.53mm/px · z∈[-91,+69]mm · 4 of 55 slices shown]
[im 1/55]
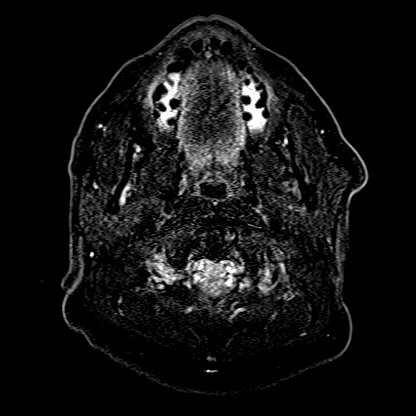
[im 19/55]
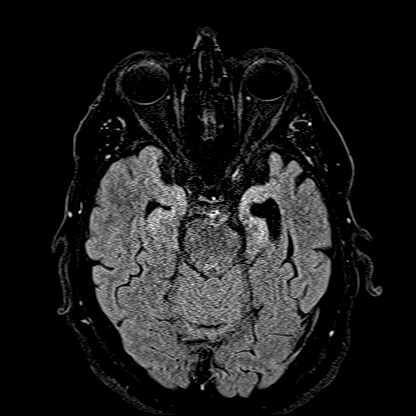
[im 37/55]
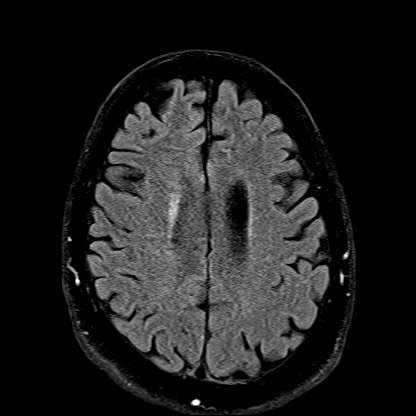
[im 55/55]
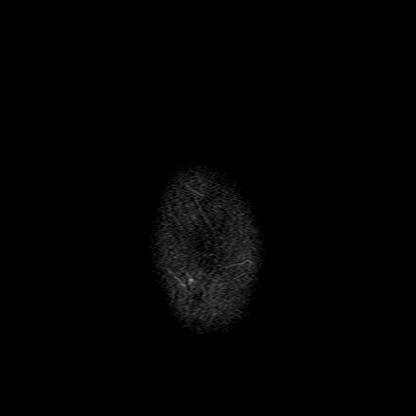

[Series 16: T1 · axial · 1.0mm · 0.98mm/px · z∈[-100,+73]mm · 13 of 176 slices shown (2 of 2)]
[im 1/176]
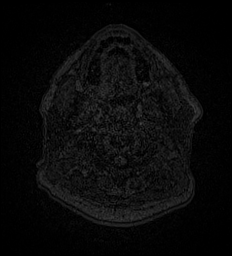
[im 15/176]
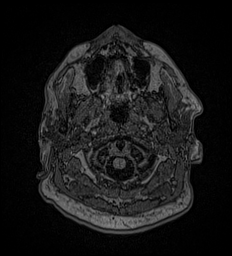
[im 30/176]
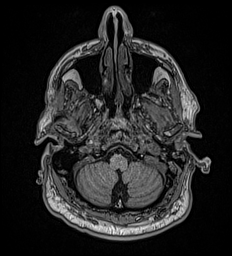
[im 44/176]
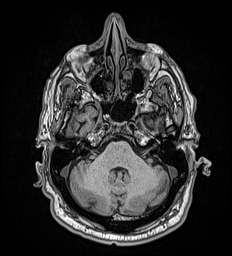
[im 59/176]
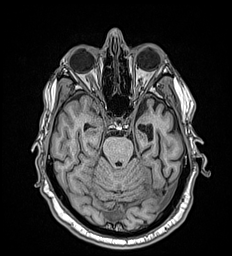
[im 73/176]
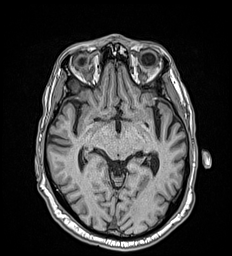
[im 88/176]
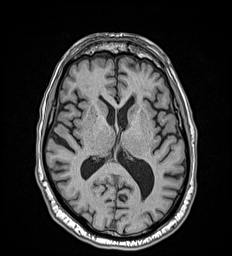
[im 103/176]
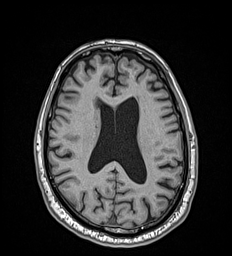
[im 117/176]
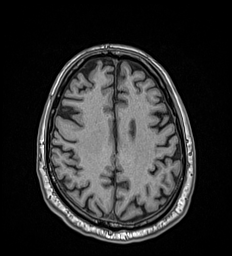
[im 132/176]
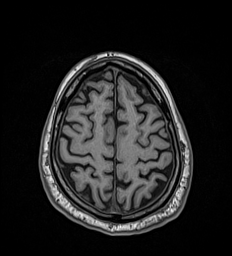
[im 146/176]
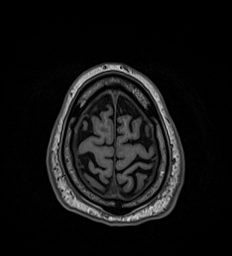
[im 161/176]
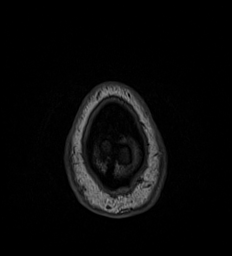
[im 176/176]
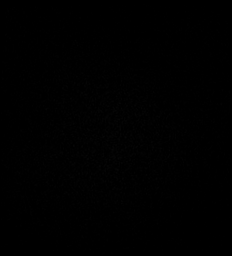

[Series 17: T2 · coronal · 5.0mm · 0.57mm/px · 2 of 29 slices shown (2 of 2)]
[im 1/29]
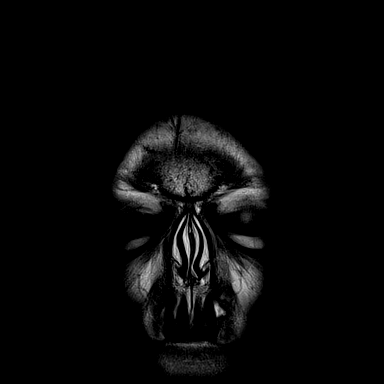
[im 29/29]
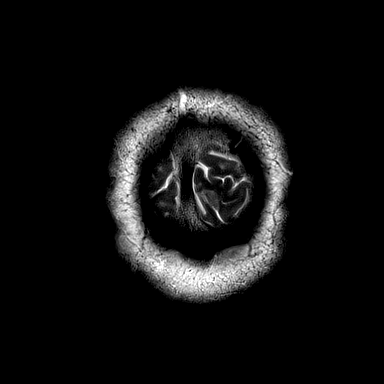

[48 of 48 positions shown; findings below may reference images not displayed]

FINDINGS: Brain: Brain atrophy asymmetrically affecting the left temporal lobe
where there is horn and sulcus widening. No superimposed gliosis or
hemorrhage. No infarct, mass, hydrocephalus, or collection. Less
than typical chronic microvascular ischemic changes in the
hemispheric white matter.

Vascular: Normal flow voids

Skull and upper cervical spine: Normal marrow signal

Sinuses/Orbits: Bilateral cataract resection
IMPRESSION: 1. Asymmetric left temporal lobe atrophy, question symptoms of
semantic dementia.
2. No reversible finding or evidence of recent injury.

## 2022-04-15 IMAGING — US US CAROTID DUPLEX BILAT
1 series · 13 of 24 positions shown · non-contrast
Comparison: None Available.

CLINICAL DATA: Memory changes, hypertension and hyperlipidemia

EXAM:
BILATERAL CAROTID DUPLEX ULTRASOUND
TECHNIQUE: Gray scale imaging, color Doppler and duplex ultrasound were
performed of bilateral carotid and vertebral arteries in the neck.

[Series 1: us carotid bilateral · 13 of 63 slices shown]
[im 1/63]
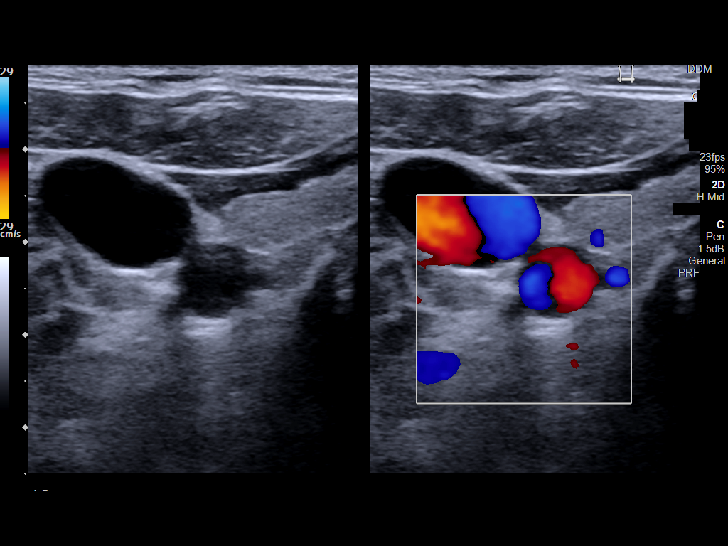
[im 6/63]
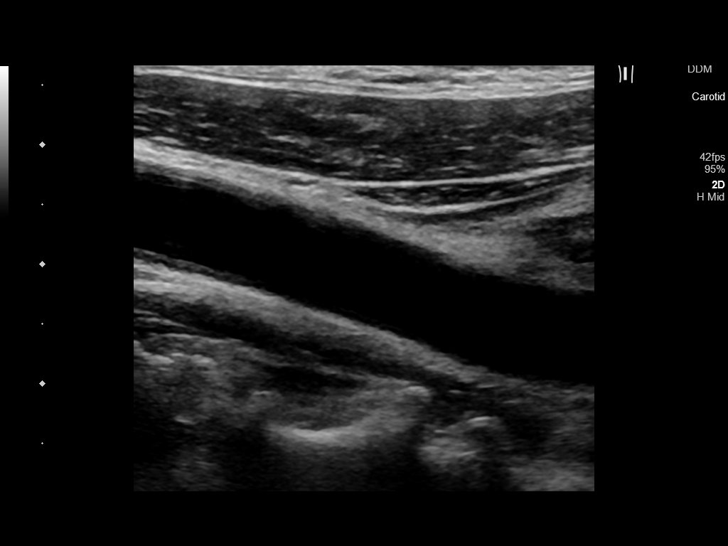
[im 11/63]
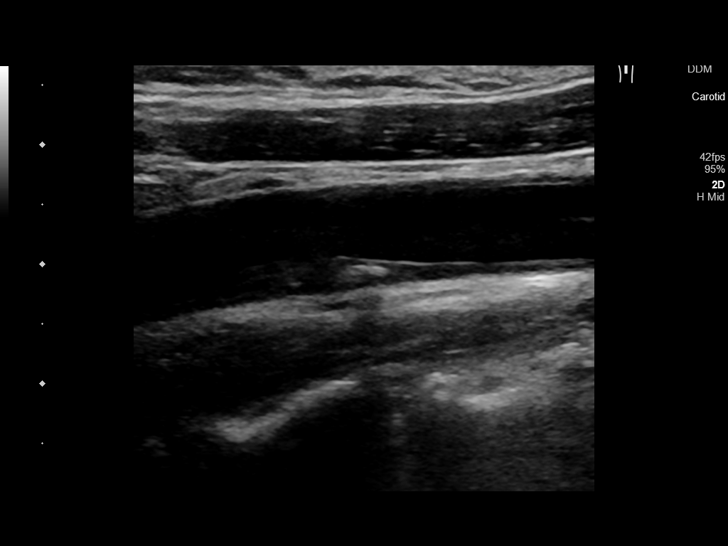
[im 17/63]
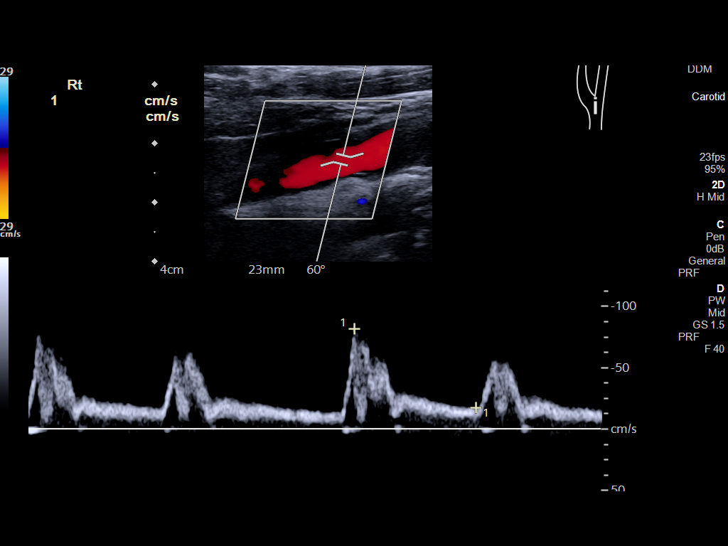
[im 22/63]
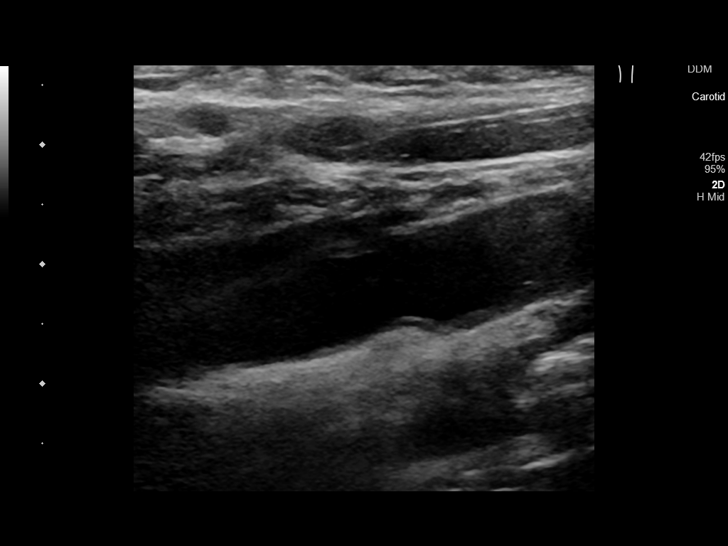
[im 27/63]
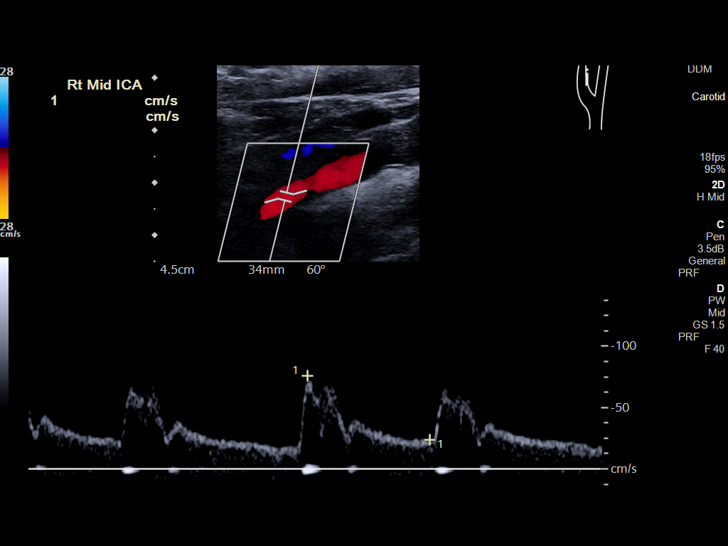
[im 33/63]
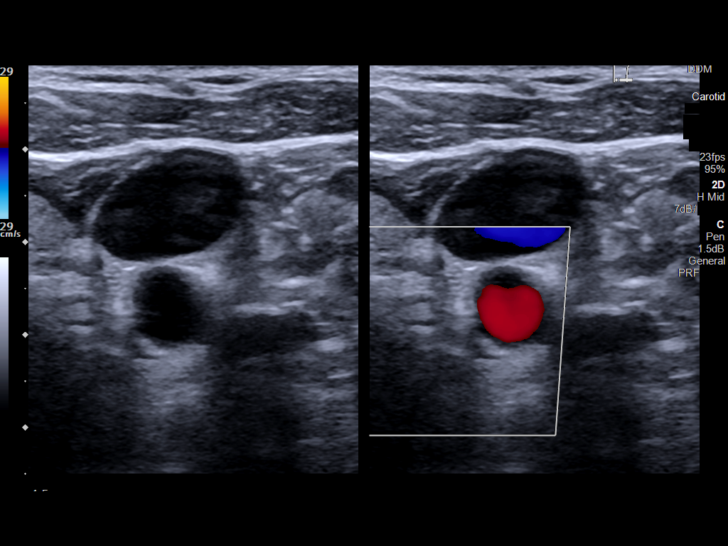
[im 36/63]
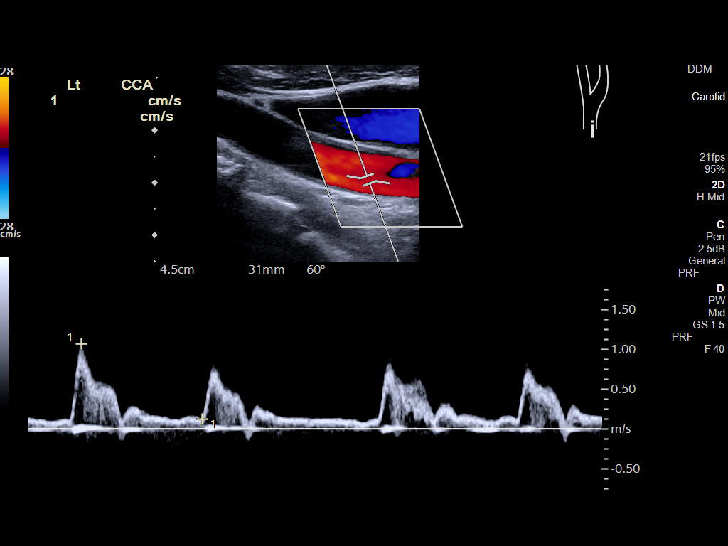
[im 41/63]
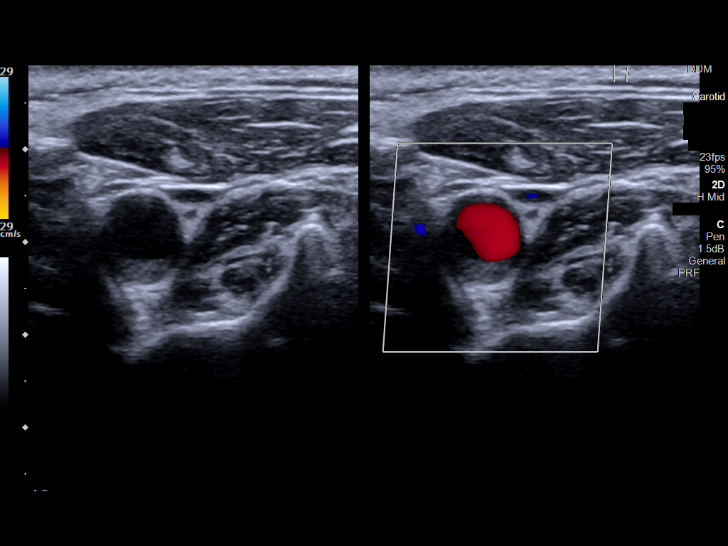
[im 46/63]
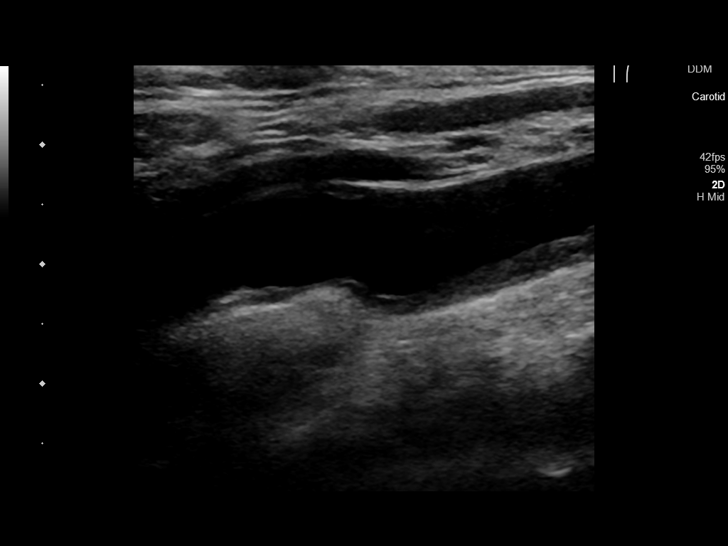
[im 52/63]
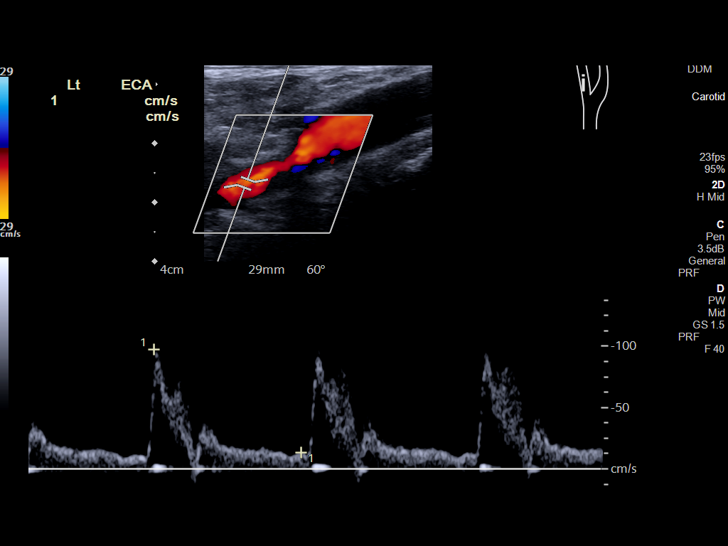
[im 57/63]
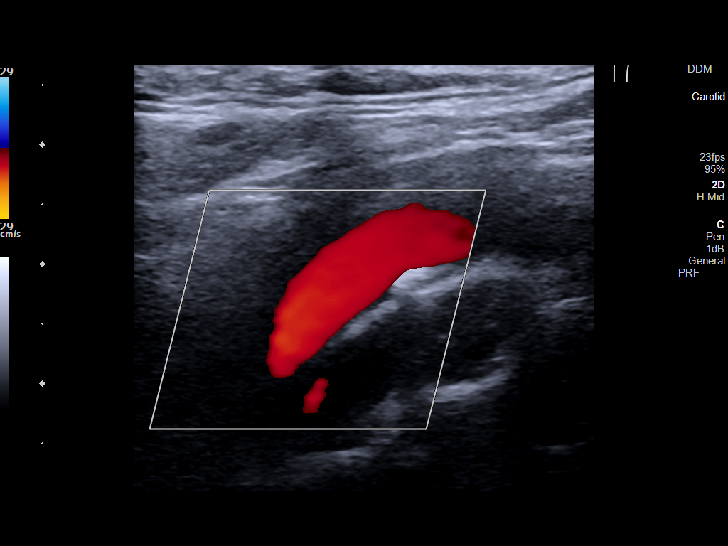
[im 63/63]
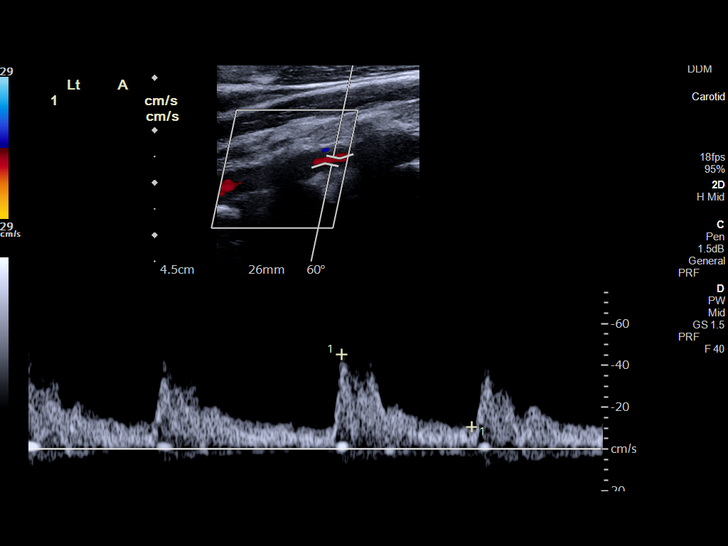

[13 of 24 positions shown; findings below may reference images not displayed]

FINDINGS: Criteria: Quantification of carotid stenosis is based on velocity
parameters that correlate the residual internal carotid diameter
with NASCET-based stenosis levels, using the diameter of the distal
internal carotid lumen as the denominator for stenosis measurement.

The following velocity measurements were obtained:

RIGHT

ICA: 85/27 cm/sec

CCA: 65/12 cm/sec

SYSTOLIC ICA/CCA RATIO:

ECA: 89 cm/sec

LEFT

ICA: 66/19 cm/sec

CCA: 80/20 cm/sec

SYSTOLIC ICA/CCA RATIO:

ECA: 97 cm/sec

RIGHT CAROTID ARTERY: Trace bifurcation thin hypoechoic
atherosclerosis. Negative for stenosis, velocity elevation,
turbulent flow. Degree of narrowing less than 50% by ultrasound
criteria.

RIGHT VERTEBRAL ARTERY:  Normal antegrade flow

LEFT CAROTID ARTERY: Similar trace thin hypoechoic atherosclerosis.
Negative for stenosis, velocity elevation, turbulent flow. Degree of
narrowing also less than 50% by ultrasound criteria.

LEFT VERTEBRAL ARTERY:  Normal antegrade flow
IMPRESSION: Minor carotid atherosclerosis. Negative for stenosis. Degree of
narrowing less than 50% bilaterally by ultrasound criteria.

Patent antegrade vertebral flow bilaterally

## 2022-04-16 ENCOUNTER — Encounter: Payer: Self-pay | Admitting: Nurse Practitioner

## 2022-04-16 ENCOUNTER — Telehealth: Payer: Self-pay | Admitting: Nurse Practitioner

## 2022-04-16 NOTE — Telephone Encounter (Signed)
Spoke to patient on telephone about recent imaging results.  Discussed carotid ultrasound which showed some plaque build up, but no blockages or stenosis.  Educated him on this.  MRI discussed at length -- educated him on brain atrophy being present to left temporal area and possible sementic dementia.  He is currently on Aricept, started last visit.  Discussed with him to continue this, as dementia is progressive disease but there can be ways to slow progression down.  Recommended crossword puzzles and regular puzzles at home + doing daily reminder lists.  Patient verbalized understanding and all questions answered.  Will speak to wife when she returns home and recommended if she has questions for her to call provider.

## 2022-04-16 NOTE — Progress Notes (Signed)
Review telephone note from 04/16/22.

## 2022-04-16 NOTE — Progress Notes (Signed)
Refer to telephone note 04/16/22.

## 2022-04-19 NOTE — Patient Instructions (Incomplete)

## 2022-04-22 ENCOUNTER — Ambulatory Visit: Payer: PPO | Admitting: Nurse Practitioner

## 2022-04-24 ENCOUNTER — Other Ambulatory Visit: Payer: Self-pay | Admitting: Nurse Practitioner

## 2022-04-27 ENCOUNTER — Other Ambulatory Visit: Payer: Self-pay

## 2022-04-27 ENCOUNTER — Encounter: Payer: Self-pay | Admitting: Nurse Practitioner

## 2022-04-27 MED ORDER — GABAPENTIN 300 MG PO CAPS: 300.0000 mg | ORAL_CAPSULE | Freq: Every day | ORAL | 1 refills | Status: DC

## 2022-04-27 NOTE — Telephone Encounter (Signed)
Patient requesting Gabapentin via MyChart message.

## 2022-04-28 ENCOUNTER — Ambulatory Visit (INDEPENDENT_AMBULATORY_CARE_PROVIDER_SITE_OTHER): Payer: PPO | Admitting: Internal Medicine

## 2022-04-28 ENCOUNTER — Encounter: Payer: Self-pay | Admitting: Internal Medicine

## 2022-04-28 VITALS — BP 135/71 | HR 54 | Temp 98.7°F | Ht 73.5 in | Wt 232.2 lb

## 2022-04-28 DIAGNOSIS — J069 Acute upper respiratory infection, unspecified: Secondary | ICD-10-CM | POA: Insufficient documentation

## 2022-04-28 LAB — VERITOR FLU A/B WAIVED
Influenza A: NEGATIVE
Influenza B: NEGATIVE

## 2022-04-28 MED ORDER — BENZONATATE 100 MG PO CAPS: 100.0000 mg | ORAL_CAPSULE | Freq: Two times a day (BID) | ORAL | 0 refills | Status: DC | PRN

## 2022-04-28 MED ORDER — AMOXICILLIN-POT CLAVULANATE 875-125 MG PO TABS: 1.0000 | ORAL_TABLET | Freq: Two times a day (BID) | ORAL | 0 refills | Status: DC

## 2022-04-28 MED ORDER — FEXOFENADINE HCL 180 MG PO TABS: 180.0000 mg | ORAL_TABLET | Freq: Every day | ORAL | 1 refills | Status: DC

## 2022-04-28 NOTE — Progress Notes (Signed)
BP 135/71   Pulse (!) 54   Temp 98.7 F (37.1 C) (Oral)   Ht 6' 1.5" (1.867 m)   Wt 232 lb 3.2 oz (105.3 kg)   SpO2 94%   BMI 30.22 kg/m    Subjective:    Patient ID: Christopher Huang, male    DOB: [birth year], 71 y.o.   MRN: [id number]  Chief Complaint  Patient presents with  . Facial Pain    For the past 3 days, is taking OTC Mucinex D, tessalon pearles and tussinex. Patient states he is feeling better today   . sinus pressure  . sinus drainage    HPI: Christopher Huang is a 71 y.o. male  Sinus Problem This is a new problem. The current episode started in the past 7 days. Associated symptoms include congestion, coughing, headaches, sinus pressure and a sore throat. Pertinent negatives include no chills, diaphoresis, ear pain, hoarse voice, neck pain, shortness of breath, sneezing or swollen glands.   Chief Complaint  Patient presents with  . Facial Pain    For the past 3 days, is taking OTC Mucinex D, tessalon pearles and tussinex. Patient states he is feeling better today   . sinus pressure  . sinus drainage    Relevant past medical, surgical, family and social history reviewed and updated as indicated. Interim medical history since our last visit reviewed. Allergies and medications reviewed and updated.  Review of Systems  Constitutional:  Negative for chills and diaphoresis.  HENT:  Positive for congestion, sinus pressure and sore throat. Negative for ear pain, hoarse voice and sneezing.   Respiratory:  Positive for cough. Negative for shortness of breath.   Musculoskeletal:  Negative for neck pain.  Neurological:  Positive for headaches.   Per HPI unless specifically indicated above     Objective:    BP 135/71   Pulse (!) 54   Temp 98.7 F (37.1 C) (Oral)   Ht 6' 1.5" (1.867 m)   Wt 232 lb 3.2 oz (105.3 kg)   SpO2 94%   BMI 30.22 kg/m   Wt Readings from Last 3 Encounters:  04/28/22 232 lb 3.2 oz (105.3 kg)  03/27/22 228 lb 3.2 oz (103.5 kg)   09/26/21 233 lb (105.7 kg)    Physical Exam Vitals and nursing note reviewed.  Constitutional:      General: He is not in acute distress.    Appearance: Normal appearance. He is not ill-appearing or diaphoretic.  HENT:     Head: Normocephalic and atraumatic.     Right Ear: Tympanic membrane and external ear normal. There is no impacted cerumen.     Left Ear: External ear normal.     Nose: No congestion or rhinorrhea.     Mouth/Throat:     Pharynx: No oropharyngeal exudate or posterior oropharyngeal erythema.  Eyes:     Conjunctiva/sclera: Conjunctivae normal.     Pupils: Pupils are equal, round, and reactive to light.  Cardiovascular:     Rate and Rhythm: Normal rate and regular rhythm.     Heart sounds: No murmur heard.   No friction rub. No gallop.  Pulmonary:     Effort: No respiratory distress.     Breath sounds: No stridor. No wheezing or rhonchi.  Chest:     Chest wall: No tenderness.  Musculoskeletal:     Cervical back: Normal range of motion and neck supple. No rigidity or tenderness.     Left lower leg: No edema.  Skin:  General: Skin is warm and dry.  Neurological:     Mental Status: He is alert.    Results for orders placed or performed in visit on 04/28/22  Veritor Flu A/B Waived  Result Value Ref Range   Influenza A Negative Negative   Influenza B Negative Negative        Current Outpatient Medications:  .  Acetaminophen (ACETAMINOPHEN EXTRA STRENGTH) 500 MG capsule, Take 1 capsule by mouth every 6 (six) hours as needed for fever., Disp: , Rfl:  .  ALPRAZolam (XANAX) 0.5 MG tablet, Take 0.5 MG (one tablet) by mouth 30 minutes prior to MRI imaging, if continue to be nervous when get to imaging center can take one to two more 0.5 MG pill as needed., Disp: 3 tablet, Rfl: 0 .  amLODipine (NORVASC) 5 MG tablet, Take 1 tablet (5 mg total) by mouth daily., Disp: 90 tablet, Rfl: 4 .  amoxicillin-clavulanate (AUGMENTIN) 875-125 MG tablet, Take 1 tablet by mouth  2 (two) times daily for 7 days., Disp: 14 tablet, Rfl: 0 .  ASPIRIN 81 PO, Take 81 mg by mouth daily., Disp: , Rfl:  .  benzonatate (TESSALON) 100 MG capsule, Take 1 capsule (100 mg total) by mouth 2 (two) times daily as needed for cough., Disp: 20 capsule, Rfl: 0 .  Cholecalciferol (VITAMIN D3) 50 MCG (2000 UT) TABS, Take 2,000 Units by mouth daily., Disp: , Rfl:  .  Coenzyme Q10 100 MG TABS, Take 100 mg by mouth daily., Disp: , Rfl:  .  docusate sodium (COLACE) 100 MG capsule, Take 100 mg by mouth daily., Disp: , Rfl:  .  fexofenadine (ALLEGRA ALLERGY) 180 MG tablet, Take 1 tablet (180 mg total) by mouth daily., Disp: 10 tablet, Rfl: 1 .  fexofenadine (ALLEGRA ALLERGY) 180 MG tablet, Take 1 tablet (180 mg total) by mouth daily., Disp: 10 tablet, Rfl: 1 .  fluticasone (FLONASE) 50 MCG/ACT nasal spray, PLACE 2 SPRAYS INTO EACH NOSTRIL DAILY., Disp: 48 mL, Rfl: 3 .  gabapentin (NEURONTIN) 300 MG capsule, Take 1 capsule (300 mg total) by mouth at bedtime. One 300 mg tablet once a day  at night  per patient, Disp: 90 capsule, Rfl: 1 .  lisinopril (ZESTRIL) 10 MG tablet, Take 1 tablet (10 mg total) by mouth every Monday, Tuesday, Wednesday, Thursday, and Friday., Disp: 60 tablet, Rfl: 4 .  Multiple Vitamin (MULTIVITAMIN WITH MINERALS) TABS tablet, Take 1 tablet by mouth daily. Men's Multivitamin 50+, Disp: , Rfl:  .  rosuvastatin (CRESTOR) 40 MG tablet, Take 1 tablet (40 mg total) by mouth daily. Stop taking 20 MG tablets., Disp: 90 tablet, Rfl: 4 .  donepezil (ARICEPT) 5 MG tablet, Take 1 tablet (5 mg total) by mouth at bedtime. (Patient not taking: Reported on 04/28/2022), Disp: 90 tablet, Rfl: 4 .  SHINGRIX injection, Inject 0.5 mLs into the muscle once., Disp: , Rfl:     Assessment & Plan:  Ac sinusitis :  COVID,Flu ordered at this visit, both negative. pt advised to take Tylenol q 4- 6 hourly as needed. pt to take allegra q pm as needed and to call office if symptoms worsened pt verbalised  understanding of such.    Problem List Items Addressed This Visit   None Visit Diagnoses     Upper respiratory tract infection, unspecified type    -  Primary   Relevant Orders   Veritor Flu A/B Waived (Completed)   Novel Coronavirus, NAA ([facility])  Orders Placed This Encounter  Procedures  . Novel Coronavirus, NAA ([facility])  . Veritor Flu A/B Waived     Meds ordered this encounter  Medications  . amoxicillin-clavulanate (AUGMENTIN) 875-125 MG tablet    Sig: Take 1 tablet by mouth 2 (two) times daily for 7 days.    Dispense:  14 tablet    Refill:  0  . fexofenadine (ALLEGRA ALLERGY) 180 MG tablet    Sig: Take 1 tablet (180 mg total) by mouth daily.    Dispense:  10 tablet    Refill:  1  . benzonatate (TESSALON) 100 MG capsule    Sig: Take 1 capsule (100 mg total) by mouth 2 (two) times daily as needed for cough.    Dispense:  20 capsule    Refill:  0     Follow up plan: No follow-ups on file.

## 2022-04-30 LAB — NOVEL CORONAVIRUS, NAA: SARS-CoV-2, NAA: NOT DETECTED

## 2022-05-05 ENCOUNTER — Encounter: Payer: Self-pay | Admitting: Psychiatry

## 2022-05-05 ENCOUNTER — Ambulatory Visit: Payer: PPO | Admitting: Psychiatry

## 2022-05-05 VITALS — BP 139/73 | HR 66 | Ht 74.0 in | Wt 228.0 lb

## 2022-05-05 DIAGNOSIS — G3109 Other frontotemporal dementia: Secondary | ICD-10-CM | POA: Diagnosis not present

## 2022-05-05 DIAGNOSIS — R413 Other amnesia: Secondary | ICD-10-CM

## 2022-05-05 DIAGNOSIS — F02B0 Dementia in other diseases classified elsewhere, moderate, without behavioral disturbance, psychotic disturbance, mood disturbance, and anxiety: Secondary | ICD-10-CM | POA: Diagnosis not present

## 2022-05-05 NOTE — Progress Notes (Signed)
[facility]  PATIENT: Christopher Huang DOB: [birth year]  REFERRING CLINICIAN: Venita Lick, NP HISTORY FROM: self, spouse Katharine Look REASON FOR VISIT: memory loss   HISTORICAL  CHIEF COMPLAINT:  Chief Complaint  Patient presents with   Memory Loss    Rm 1 with spouse Katharine Look Pt is well, has been having noticeable memory changes for about 3-4 months. He is forgetting places, peoples names, areas.     HISTORY OF PRESENT ILLNESS:  The patient presents for evaluation of memory loss which has been gradually worsening over the past ~6 months. He will get breakfast with his friends daily and will forget his friends names. Repeats questions frequently and will forget conversations he had earlier that day. He recently had an episode where he could not remember how to fill out a check. Otherwise ADLs have been relatively unaffected. He has been driving short distances to familiar places without issues.  He was recently prescribed donepezil 5 mg daily, only took one pill but it caused diarrhea and a headaches.  Brain MRI on 04/15/22 showed asymmetric left temporal lobe atrophy concerning for possible semantic dementia  TBI:  No past history of TBI Stroke:  no past history of stroke Seizures:  no past history of seizures Sleep: Sleeps well without issues Mood: He is anxious about his memory loss. Wife denies personality changes  Functional status:  Patient lives with wife Cooking: Wife does the cooking Cleaning: Will occasionally misplace objects, otherwise no issues Shopping: Has forgotten things at the store, otherwise no issues Driving: only drives short distances to familiar places, no issues Bills: He and his wife both handle the bills. He had one issue where he could not figure out how to write numbers on a check Medications: Wife manages his medications Forgetting loved ones names?: forgets friends names, no family Word finding difficulty? mild   OTHER  MEDICAL CONDITIONS: HTN, HLD, nephrolithiasis, hx prostate cancer   REVIEW OF SYSTEMS: Full 14 system review of systems performed and negative with exception of: memory loss  ALLERGIES: Allergies  Allergen Reactions   Percocet [Oxycodone-Acetaminophen] Nausea And Vomiting   Tramadol Nausea And Vomiting   Vicodin [Hydrocodone-Acetaminophen] Nausea And Vomiting    HOME MEDICATIONS: Outpatient Medications Prior to Visit  Medication Sig Dispense Refill   Acetaminophen (ACETAMINOPHEN EXTRA STRENGTH) 500 MG capsule Take 1 capsule by mouth every 6 (six) hours as needed for fever.     amLODipine (NORVASC) 5 MG tablet Take 1 tablet (5 mg total) by mouth daily. 90 tablet 4   ASPIRIN 81 PO Take 81 mg by mouth daily.     benzonatate (TESSALON) 100 MG capsule Take 1 capsule (100 mg total) by mouth 2 (two) times daily as needed for cough. 20 capsule 0   Cholecalciferol (VITAMIN D3) 50 MCG (2000 UT) TABS Take 2,000 Units by mouth daily.     Coenzyme Q10 100 MG TABS Take 100 mg by mouth daily.     docusate sodium (COLACE) 100 MG capsule Take 100 mg by mouth daily.     donepezil (ARICEPT) 5 MG tablet Take 1 tablet (5 mg total) by mouth at bedtime. 90 tablet 4   fexofenadine (ALLEGRA ALLERGY) 180 MG tablet Take 1 tablet (180 mg total) by mouth daily. 10 tablet 1   fluticasone (FLONASE) 50 MCG/ACT nasal spray PLACE 2 SPRAYS INTO EACH NOSTRIL DAILY. 48 mL 3   gabapentin (NEURONTIN) 300 MG capsule Take 1 capsule (300 mg total) by mouth at bedtime. One 300 mg  tablet once a day  at night  per patient 90 capsule 1   lisinopril (ZESTRIL) 10 MG tablet Take 1 tablet (10 mg total) by mouth every Monday, Tuesday, Wednesday, Thursday, and Friday. 60 tablet 4   meloxicam (MOBIC) 15 MG tablet Take 15 mg by mouth daily.     Multiple Vitamin (MULTIVITAMIN WITH MINERALS) TABS tablet Take 1 tablet by mouth daily. Men's Multivitamin 50+     rosuvastatin (CRESTOR) 40 MG tablet Take 1 tablet (40 mg total) by mouth daily.  Stop taking 20 MG tablets. 90 tablet 4   vitamin B-12 (CYANOCOBALAMIN) 500 MCG tablet Take 500 mcg by mouth daily.     ALPRAZolam (XANAX) 0.5 MG tablet Take 0.5 MG (one tablet) by mouth 30 minutes prior to MRI imaging, if continue to be nervous when get to imaging center can take one to two more 0.5 MG pill as needed. (Patient not taking: Reported on 05/05/2022) 3 tablet 0   amoxicillin-clavulanate (AUGMENTIN) 875-125 MG tablet Take 1 tablet by mouth 2 (two) times daily for 7 days. (Patient not taking: Reported on 05/05/2022) 14 tablet 0   fexofenadine (ALLEGRA ALLERGY) 180 MG tablet Take 1 tablet (180 mg total) by mouth daily. (Patient not taking: Reported on 05/05/2022) 10 tablet 1   SHINGRIX injection Inject 0.5 mLs into the muscle once. (Patient not taking: Reported on 05/05/2022)     No facility-administered medications prior to visit.    PAST MEDICAL HISTORY: Past Medical History:  Diagnosis Date   Allergic rhinitis    Cancer of prostate ([place]) 4/08   s/p surgery   History of kidney stones    H/O   Hyperlipidemia    Hypertension    Personal history of kidney stones     PAST SURGICAL HISTORY: Past Surgical History:  Procedure Laterality Date   ANTERIOR LATERAL LUMBAR FUSION WITH PERCUTANEOUS SCREW 1 LEVEL N/A 01/29/2021   Procedure: L4-5 LATERAL INTERBODY FUSION, L4-5 POSTERIOR FUSION;  Surgeon: Meade Maw, MD;  Location: [facility] ORS;  Service: Neurosurgery;  Laterality: N/A;   basal skin cancers     CATARACT EXTRACTION, BILATERAL     2021   COLONOSCOPY WITH PROPOFOL N/A 11/09/2017   Procedure: COLONOSCOPY WITH PROPOFOL;  Surgeon: Lucilla Lame, MD;  Location: [facility] ENDOSCOPY;  Service: Endoscopy;  Laterality: N/A;   EYE SURGERY Bilateral    Cataract   HAND SURGERY     THUMB SURGERY   KNEE ARTHROSCOPY     prostate cancer removed     TONSILLECTOMY     TONSILLECTOMY     TOTAL KNEE ARTHROPLASTY Left 02/09/2020   Procedure: TOTAL KNEE ARTHROPLASTY - RNFA;  Surgeon: Hessie Knows, MD;  Location: [facility] ORS;  Service: Orthopedics;  Laterality: Left;    FAMILY HISTORY: Family History  Problem Relation Age of Onset   Dementia Mother    Prostate cancer Father     SOCIAL HISTORY: Social History   Socioeconomic History   Marital status: Married    Spouse name: Not on file   Number of children: Not on file   Years of education: Not on file   Highest education level: High school graduate  Occupational History   Occupation: retired   Tobacco Use   Smoking status: Never   Smokeless tobacco: Never  Vaping Use   Vaping Use: Never used  Substance and Sexual Activity   Alcohol use: No   Drug use: No   Sexual activity: Yes  Other Topics Concern   Not on file  Social History Narrative   Goes fishing and hunting    Meets with friends every morning for breakfast    Church    Social Determinants of Health   Financial Resource Strain: Low Risk  (02/09/2022)   Overall Financial Resource Strain (CARDIA)    Difficulty of Paying Living Expenses: Not hard at all  Food Insecurity: No Food Insecurity (02/09/2022)   Hunger Vital Sign    Worried About Running Out of Food in the Last Year: Never true    Ran Out of Food in the Last Year: Never true  Transportation Needs: No Transportation Needs (02/09/2022)   PRAPARE - Hydrologist (Medical): No    Lack of Transportation (Non-Medical): No  Physical Activity: Sufficiently Active (02/09/2022)   Exercise Vital Sign    Days of Exercise per Week: 4 days    Minutes of Exercise per Session: 50 min  Stress: No Stress Concern Present (02/09/2022)   [place]    Feeling of Stress : Not at all  Social Connections: Moderately Integrated (02/09/2022)   Social Connection and Isolation Panel [NHANES]    Frequency of Communication with Friends and Family: More than three times a week    Frequency of Social Gatherings with Friends and  Family: More than three times a week    Attends Religious Services: More than 4 times per year    Active Member of [employer] or Organizations: No    Attends Archivist Meetings: Never    Marital Status: Married  Human resources officer Violence: Not At Risk (02/09/2022)   Humiliation, Afraid, Rape, and Kick questionnaire    Fear of Current or Ex-Partner: No    Emotionally Abused: No    Physically Abused: No    Sexually Abused: No     PHYSICAL EXAM   GENERAL EXAM/CONSTITUTIONAL: Vitals:  Vitals:   05/05/22 1118  BP: 139/73  Pulse: 66  Weight: 228 lb (103.4 kg)  Height: '6\' 2"'$  (1.88 m)   Body mass index is 29.27 kg/m. Wt Readings from Last 3 Encounters:  05/05/22 228 lb (103.4 kg)  04/28/22 232 lb 3.2 oz (105.3 kg)  03/27/22 228 lb 3.2 oz (103.5 kg)    NEUROLOGIC: MENTAL STATUS:     05/05/2022   11:21 AM  Montreal Cognitive Assessment   Visuospatial/ Executive (0/5) 0  Naming (0/3) 3  Attention: Read list of digits (0/2) 2  Attention: Read list of letters (0/1) 1  Attention: Serial 7 subtraction starting at 100 (0/3) 0  Language: Repeat phrase (0/2) 1  Language : Fluency (0/1) 0  Abstraction (0/2) 1  Delayed Recall (0/5) 0  Orientation (0/6) 3  Total 11  Adjusted Score (based on education) 11    CRANIAL NERVE:  2nd, 3rd, 4th, 6th - pupils equal and reactive to light, visual fields full to confrontation, extraocular muscles intact, no nystagmus 5th - facial sensation symmetric 7th - facial strength symmetric 8th - hearing intact 9th - palate elevates symmetrically, uvula midline 11th - shoulder shrug symmetric 12th - tongue protrusion midline  MOTOR:  normal bulk and tone, no cogwheeling, full strength in the BUE, BLE  SENSORY:  normal and symmetric to light touch all 4 extremities  COORDINATION:  finger-nose-finger, fine finger movements normal, mild postural tremor present bilaterally  REFLEXES:  deep tendon reflexes present and  symmetric  GAIT/STATION:  Stooped posture, normal stride length     DIAGNOSTIC DATA (LABS, IMAGING, TESTING) -  I reviewed patient records, labs, notes, testing and imaging myself where available.  Lab Results  Component Value Date   WBC 4.5 03/27/2022   HGB 15.4 03/27/2022   HCT 45.2 03/27/2022   MCV 91 03/27/2022   PLT 219 03/27/2022      Component Value Date/Time   NA 144 03/27/2022 0858   K 4.5 03/27/2022 0858   CL 104 03/27/2022 0858   CO2 22 03/27/2022 0858   GLUCOSE 102 (H) 03/27/2022 0858   GLUCOSE 101 (H) 12/03/2020 0836   BUN 15 03/27/2022 0858   CREATININE 0.97 03/27/2022 0858   CALCIUM 9.1 03/27/2022 0858   PROT 6.5 03/27/2022 0858   ALBUMIN 4.5 03/27/2022 0858   AST 21 03/27/2022 0858   AST 31 07/15/2017 0928   ALT 20 03/27/2022 0858   ALT 21 07/15/2017 0928   ALKPHOS 97 03/27/2022 0858   BILITOT 0.3 03/27/2022 0858   GFRNONAA >60 12/03/2020 0836   GFRAA 103 08/16/2020 1154   Lab Results  Component Value Date   CHOL 163 03/27/2022   HDL 57 03/27/2022   LDLCALC 77 03/27/2022   TRIG 169 (H) 03/27/2022   CHOLHDL 4.4 02/15/2018   Lab Results  Component Value Date   HGBA1C 5.6 09/26/2021   Lab Results  Component Value Date   RAQTMAUQ33 354 03/27/2022   Lab Results  Component Value Date   TSH 1.820 03/27/2022    MRI brain 04/15/22: 1. Asymmetric left temporal lobe atrophy, question symptoms of semantic dementia. 2. No reversible finding or evidence of recent injury.   ASSESSMENT AND PLAN  71 y.o. year old male with a history of HTN, HLD, nephrolithiasis, hx prostate cancer who presents for evaluation of progressive memory loss over the past ~6 months. Brain MRI showed asymmetric atrophy of the temporal lobe, suggestive of possible semantic dementia. MOCA today is 11/30 which is consistent with dementia. He does report difficulty remembering friends names but has otherwise not noticed significant word finding difficulty. Will refer to  neuropsychology to better characterize his deficits and help clarify his diagnosis. Will continue low dose donepezil for now.   1. Memory loss       PLAN: - Referral to neuropsychological testing - Restart donepezil 5 mg daily, patient will let me know if side effects persist after ~1 week - Limit driving to short distances and familiar places with another person in the car - Follow up after testing is complete.   Orders Placed This Encounter  Procedures   Ambulatory referral to Neuropsychology    Return in about 6 months (around 11/04/2022).  I spent an average of 45 minutes chart reviewing and counseling the patient, with at least 50% of the time face to face with the patient. General brain health measures discussed, including the importance of regular aerobic exercise. Reviewed safety measures including driving safety.   Genia Harold, MD 05/05/22 12:13 PM  [facility] [address] [place], [place] [address] [phone]

## 2022-05-05 NOTE — Patient Instructions (Addendum)
Referral to neuropsychology for memory testing 2.   Restart donepezil 5 mg daily  Tasks to improve attention/working memory 1. Good sleep hygiene (7-8 hrs of sleep) 2. Learning a new skill (Painting, Carpentry, Pottery, new language, Knitting). 3.Cognitive exercises (keep a daily journal, Puzzles) 4. Physical exercise and training  (30 min/day X 4 days week) 5. Being on Antidepressant if needed 6.Yoga, Meditation, Tai Chi 7. Decrease alcohol intake 8.Have a clear schedule and structure in daily routine

## 2022-05-07 ENCOUNTER — Telehealth: Payer: Self-pay | Admitting: Psychiatry

## 2022-05-07 NOTE — Telephone Encounter (Signed)
Referral for Neuropsychology sent to [facility] [phone]. 

## 2022-05-10 NOTE — Progress Notes (Unsigned)
Cardiology Office Note  Date:  05/11/2022   ID:  [name], DOB [birth year], MRN [id number]  PCP:  Venita Lick, NP   Chief Complaint  Patient presents with   New Patient (Initial Visit)    Ref by Marnee Guarneri, NP for abnormal EKG and follow up carotid u/s. Medications reviewed by the patient verbally.     HPI:  [name] is a 71 yo male with PMH of  Memory loss HTN hyperlipidemia Who presents by referral from [facility]  for sinus bradycardia with LBBB on EKG  Sedentary, last year or so Knee replacement, back issue No regular exercise program secondary to chronic pain  MR brain 1. Asymmetric left temporal lobe atrophy, question symptoms of semantic dementia. 2. No reversible finding or evidence of recent injury.  Carotid ultrasound Mild carotid disease bilaterally  EKG personally reviewed by myself on todays visit NSR rate 62 bpm, pac, lbbb  PMH:   has a past medical history of Allergic rhinitis, Cancer of prostate ([place]) (4/08), History of kidney stones, Hyperlipidemia, Hypertension, and Personal history of kidney stones.  PSH:    Past Surgical History:  Procedure Laterality Date   ANTERIOR LATERAL LUMBAR FUSION WITH PERCUTANEOUS SCREW 1 LEVEL N/A 01/29/2021   Procedure: L4-5 LATERAL INTERBODY FUSION, L4-5 POSTERIOR FUSION;  Surgeon: Meade Maw, MD;  Location: [facility] ORS;  Service: Neurosurgery;  Laterality: N/A;   basal skin cancers     CATARACT EXTRACTION, BILATERAL     2021   COLONOSCOPY WITH PROPOFOL N/A 11/09/2017   Procedure: COLONOSCOPY WITH PROPOFOL;  Surgeon: Lucilla Lame, MD;  Location: [facility] ENDOSCOPY;  Service: Endoscopy;  Laterality: N/A;   EYE SURGERY Bilateral    Cataract   HAND SURGERY     THUMB SURGERY   KNEE ARTHROSCOPY     prostate cancer removed     TONSILLECTOMY     TONSILLECTOMY     TOTAL KNEE ARTHROPLASTY Left 02/09/2020   Procedure: TOTAL KNEE ARTHROPLASTY - RNFA;  Surgeon: Hessie Knows, MD;  Location: [facility] ORS;   Service: Orthopedics;  Laterality: Left;    Current Outpatient Medications  Medication Sig Dispense Refill   Acetaminophen (ACETAMINOPHEN EXTRA STRENGTH) 500 MG capsule Take 1 capsule by mouth every 6 (six) hours as needed for fever.     amLODipine (NORVASC) 5 MG tablet Take 1 tablet (5 mg total) by mouth daily. 90 tablet 4   ASPIRIN 81 PO Take 81 mg by mouth daily.     benzonatate (TESSALON) 100 MG capsule Take 1 capsule (100 mg total) by mouth 2 (two) times daily as needed for cough. 20 capsule 0   Cholecalciferol (VITAMIN D3) 50 MCG (2000 UT) TABS Take 2,000 Units by mouth daily.     Coenzyme Q10 100 MG TABS Take 100 mg by mouth daily.     docusate sodium (COLACE) 100 MG capsule Take 100 mg by mouth daily.     donepezil (ARICEPT) 5 MG tablet Take 1 tablet (5 mg total) by mouth at bedtime. 90 tablet 4   fexofenadine (ALLEGRA ALLERGY) 180 MG tablet Take 1 tablet (180 mg total) by mouth daily. 10 tablet 1   fluticasone (FLONASE) 50 MCG/ACT nasal spray PLACE 2 SPRAYS INTO EACH NOSTRIL DAILY. 48 mL 3   gabapentin (NEURONTIN) 300 MG capsule Take 1 capsule (300 mg total) by mouth at bedtime. One 300 mg tablet once a day  at night  per patient 90 capsule 1   lisinopril (ZESTRIL) 10 MG tablet Take 1  tablet (10 mg total) by mouth every Monday, Tuesday, Wednesday, Thursday, and Friday. 60 tablet 4   meloxicam (MOBIC) 15 MG tablet Take 15 mg by mouth daily.     Multiple Vitamin (MULTIVITAMIN WITH MINERALS) TABS tablet Take 1 tablet by mouth daily. Men's Multivitamin 50+     rosuvastatin (CRESTOR) 40 MG tablet Take 1 tablet (40 mg total) by mouth daily. Stop taking 20 MG tablets. 90 tablet 4   vitamin B-12 (CYANOCOBALAMIN) 500 MCG tablet Take 500 mcg by mouth daily.     No current facility-administered medications for this visit.    Allergies:   Percocet [oxycodone-acetaminophen], Tramadol, and Vicodin [hydrocodone-acetaminophen]   Social History:  The patient  reports that he has never smoked. He  has never used smokeless tobacco. He reports that he does not drink alcohol and does not use drugs.   Family History:   family history includes Dementia in his mother; Prostate cancer in his father.    Review of Systems: Review of Systems  Constitutional: Negative.   HENT: Negative.    Respiratory: Negative.    Cardiovascular: Negative.   Gastrointestinal: Negative.   Musculoskeletal:  Positive for back pain and joint pain.  Neurological: Negative.   Psychiatric/Behavioral: Negative.    All other systems reviewed and are negative.    PHYSICAL EXAM: VS:  BP 132/72 (BP Location: Right Arm, Patient Position: Sitting, Cuff Size: Normal)   Pulse 62   Ht '6\' 1"'$  (1.854 m)   Wt 229 lb 2 oz (103.9 kg)   SpO2 98%   BMI 30.23 kg/m  , BMI Body mass index is 30.23 kg/m. GEN: Well nourished, well developed, in no acute distress HEENT: normal Neck: no JVD, carotid bruits, or masses Cardiac: RRR; no murmurs, rubs, or gallops,no edema  Respiratory:  clear to auscultation bilaterally, normal work of breathing GI: soft, nontender, nondistended, + BS MS: no deformity or atrophy Skin: warm and dry, no rash Neuro:  Strength and sensation are intact Psych: euthymic mood, full affect   Recent Labs: 03/27/2022: ALT 20; BUN 15; Creatinine, Ser 0.97; Hemoglobin 15.4; Platelets 219; Potassium 4.5; Sodium 144; TSH 1.820    Lipid Panel Lab Results  Component Value Date   CHOL 163 03/27/2022   HDL 57 03/27/2022   LDLCALC 77 03/27/2022   TRIG 169 (H) 03/27/2022      Wt Readings from Last 3 Encounters:  05/11/22 229 lb 2 oz (103.9 kg)  05/05/22 228 lb (103.4 kg)  04/28/22 232 lb 3.2 oz (105.3 kg)       ASSESSMENT AND PLAN:  Problem List Items Addressed This Visit       Cardiology Problems   Hyperlipidemia - Primary   Hypertension     Other   Abnormal EKG   Relevant Orders   EKG 12-Lead   Other Visit Diagnoses     Precordial pain       Relevant Orders   NM Myocar Multi  W/Spect W/Wall Motion / EF      Abnormal EKG/left bundle branch block/angina New finding in the past several months Unable to exercise We have recommended ischemic work-up for new finding We will order pharmacologic Myoview to rule out ischemia  Hyperlipidemia On Crestor 40, dose recently increased No changes to his medications  Essential hypertension Blood pressure is well controlled on today's visit. No changes made to the medications.  Carotid disease Minimal nonobstructive disease on ultrasound Agree with aggressive cholesterol management    Total encounter time more than 50  minutes  Greater than 50% was spent in counseling and coordination of care with the patient    Signed, Esmond Plants, M.D., Ph.D. [place], [place]

## 2022-05-11 ENCOUNTER — Encounter: Payer: Self-pay | Admitting: Cardiovascular Disease

## 2022-05-11 ENCOUNTER — Ambulatory Visit: Payer: PPO | Admitting: Cardiovascular Disease

## 2022-05-11 VITALS — BP 132/72 | HR 62 | Ht 73.0 in | Wt 229.1 lb

## 2022-05-11 DIAGNOSIS — R072 Precordial pain: Secondary | ICD-10-CM

## 2022-05-11 DIAGNOSIS — I447 Left bundle-branch block, unspecified: Secondary | ICD-10-CM | POA: Diagnosis not present

## 2022-05-11 DIAGNOSIS — E782 Mixed hyperlipidemia: Secondary | ICD-10-CM

## 2022-05-11 DIAGNOSIS — R9431 Abnormal electrocardiogram [ECG] [EKG]: Secondary | ICD-10-CM

## 2022-05-11 DIAGNOSIS — I209 Angina pectoris, unspecified: Secondary | ICD-10-CM | POA: Diagnosis not present

## 2022-05-11 DIAGNOSIS — I10 Essential (primary) hypertension: Secondary | ICD-10-CM | POA: Diagnosis not present

## 2022-05-11 NOTE — Patient Instructions (Addendum)
Medication Instructions:  No changes  If you need a refill on your cardiac medications before your next appointment, please call your pharmacy.   Lab work: No new labs needed  Testing/Procedures:  [employer]  Your caregiver has ordered a Stress Test with nuclear imaging. The purpose of this test is to evaluate the blood supply to your heart muscle. This procedure is referred to as a "Non-Invasive Stress Test." This is because other than having an IV started in your vein, nothing is inserted or "invades" your body. Cardiac stress tests are done to find areas of poor blood flow to the heart by determining the extent of coronary artery disease (CAD). Some patients exercise on a treadmill, which naturally increases the blood flow to your heart, while others who are  unable to walk on a treadmill due to physical limitations have a pharmacologic/chemical stress agent called Lexiscan . This medicine will mimic walking on a treadmill by temporarily increasing your coronary blood flow.   Please note: these test may take anywhere between 2-4 hours to complete  PLEASE REPORT TO [facility] AT THE FIRST DESK WILL DIRECT YOU WHERE TO GO  Date of Procedure:_____________________________________  Arrival Time for Procedure:______________________________   PLEASE NOTIFY THE OFFICE AT LEAST 24 HOURS IN ADVANCE IF YOU ARE UNABLE TO KEEP YOUR APPOINTMENT.  [phone] AND  PLEASE NOTIFY NUCLEAR MEDICINE AT [facility] AT LEAST 24 HOURS IN ADVANCE IF YOU ARE UNABLE TO KEEP YOUR APPOINTMENT. [phone]  How to prepare for your Myoview test:  Do not eat or drink after midnight No caffeine for 24 hours prior to test No smoking 24 hours prior to test. Your medication may be taken with water.  If your doctor stopped a medication because of this test, do not take that medication. Ladies, please do not wear dresses.  Skirts or pants are appropriate. Please wear a short sleeve  shirt. No perfume, cologne or lotion. Wear comfortable walking shoes. No heels!   Follow-Up: At [facility], you and your health needs are our priority.  As part of our continuing mission to provide you with exceptional heart care, we have created designated Provider Care Teams.  These Care Teams include your primary Cardiologist (physician) and Advanced Practice Providers (APPs -  Physician Assistants and Nurse Practitioners) who all work together to provide you with the care you need, when you need it.  You will need a follow up appointment as needed  Providers on your designated Care Team:   Mcarthy Hodgkins, NP Christell Faith, PA-C Cadence Kathlen Mody, [place]  COVID-19 Vaccine Information can be found at: [email or web address] For questions related to vaccine distribution or appointments, please email [email or web address] or call [phone].

## 2022-05-14 ENCOUNTER — Telehealth: Payer: Self-pay | Admitting: Cardiovascular Disease

## 2022-05-14 NOTE — Telephone Encounter (Signed)
Elberta Fortis from radiology scheduling called stating they need a signed order for patient upcoming test on 6/27. They only had a verbal order when they scheduled the test. If they don't receive a signed order  they will have to cancel patient test.

## 2022-05-14 NOTE — Telephone Encounter (Signed)
To Dr. Rockey Situ to [facility] order.

## 2022-05-15 ENCOUNTER — Other Ambulatory Visit: Payer: PPO

## 2022-05-15 ENCOUNTER — Ambulatory Visit: Payer: PPO | Admitting: Nurse Practitioner

## 2022-05-19 ENCOUNTER — Encounter
Admission: RE | Admit: 2022-05-19 | Discharge: 2022-05-19 | Disposition: A | Discharge status: Home / Self Care | Payer: PPO | Source: Ambulatory Visit | Attending: Cardiovascular Disease | Admitting: Cardiovascular Disease

## 2022-05-19 DIAGNOSIS — R9431 Abnormal electrocardiogram [ECG] [EKG]: Secondary | ICD-10-CM

## 2022-05-19 DIAGNOSIS — I447 Left bundle-branch block, unspecified: Secondary | ICD-10-CM

## 2022-05-19 DIAGNOSIS — R079 Chest pain, unspecified: Secondary | ICD-10-CM

## 2022-05-19 DIAGNOSIS — I209 Angina pectoris, unspecified: Secondary | ICD-10-CM | POA: Diagnosis not present

## 2022-05-19 DIAGNOSIS — I10 Essential (primary) hypertension: Secondary | ICD-10-CM | POA: Diagnosis not present

## 2022-05-19 MED ORDER — TECHNETIUM TC 99M TETROFOSMIN IV KIT
10.6600 | PACK | Freq: Once | INTRAVENOUS | Status: AC | PRN
  Administered 2022-05-19: 10.66 via INTRAVENOUS

## 2022-05-19 MED ORDER — REGADENOSON 0.4 MG/5ML IV SOLN
0.4000 mg | Freq: Once | INTRAVENOUS | Status: AC
Start: 2022-05-19 — End: 2022-05-19
  Administered 2022-05-19: 0.4 mg via INTRAVENOUS

## 2022-05-19 MED ORDER — TECHNETIUM TC 99M TETROFOSMIN IV KIT
30.7500 | PACK | Freq: Once | INTRAVENOUS | Status: AC | PRN
  Administered 2022-05-19: 30.75 via INTRAVENOUS

## 2022-05-20 LAB — NM MYOCAR MULTI W/SPECT W/WALL MOTION / EF
LV dias vol: 180 mL (ref 62–150)
LV sys vol: 61 mL
Nuc Stress EF: 48 %
Peak HR: 84 {beats}/min
Percent HR: 56 %
Rest HR: 56 {beats}/min
Rest Nuclear Isotope Dose: 10.7 mCi
SDS: 4
SRS: 16
SSS: 22
ST Depression (mm): 0 mm
Stress Nuclear Isotope Dose: 30.8 mCi
TID: 1.04

## 2022-05-24 DIAGNOSIS — I447 Left bundle-branch block, unspecified: Secondary | ICD-10-CM | POA: Insufficient documentation

## 2022-05-24 NOTE — Patient Instructions (Signed)
Dementia Caregiver Guide Dementia is a term used to describe a number of symptoms that affect memory and thinking. The most common symptoms include: Memory loss. Trouble with language and communication. Trouble concentrating. Poor judgment and problems with reasoning. Wandering from home or public places. Extreme anxiety or depression. Being suspicious or having angry outbursts and accusations. Child-like behavior and language. Dementia can be frightening and confusing. And taking care of someone with dementia can be challenging. This guide provides tips to help you when providing care for a person with dementia. How to help manage lifestyle changes Dementia usually gets worse slowly over time. In the early stages, people with dementia can stay independent and safe with some help. In later stages, they need help with daily tasks such as dressing, grooming, and using the bathroom. There are actions you can take to help a person manage his or her life while living with this condition. Communicating When the person is talking or seems frustrated, make eye contact and hold the person's hand. Ask specific questions that need yes or no answers. Use simple words, short sentences, and a calm voice. Only give one direction at a time. When offering choices, limit the person to just one or two. Avoid correcting the person in a negative way. If the person is struggling to find the right words, gently try to help him or her. Preventing injury  Keep floors clear of clutter. Remove rugs, magazine racks, and floor lamps. Keep hallways well lit, especially at night. Put a handrail and nonslip mat in the bathtub or shower. Put childproof locks on cabinets that contain dangerous items, such as medicines, alcohol, guns, toxic cleaning items, sharp tools or utensils, matches, and lighters. For doors to the outside of the house, put the locks in places where the person cannot see or reach them easily. This will  help ensure that the person does not wander out of the house and get lost. Be prepared for emergencies. Keep a list of emergency phone numbers and addresses in a convenient area. Remove car keys and lock garage doors so that the person does not try to get in the car and drive. Have the person wear a bracelet that tracks locations and identifies the person as having memory problems. This should be worn at all times for safety. Helping with daily life  Keep the person on track with his or her routine. Try to identify areas where the person may need help. Be supportive, patient, calm, and encouraging. Gently remind the person that adjusting to changes takes time. Help with the tasks that the person has asked for help with. Keep the person involved in daily tasks and decisions as much as possible. Encourage conversation, but try not to get frustrated if the person struggles to find words or does not seem to appreciate your help. How to recognize stress Look for signs of stress in yourself and in the person you are caring for. If you notice signs of stress, take steps to manage it. Symptoms of stress include: Feeling anxious, irritable, frustrated, or angry. Denying that the person has dementia or that his or her symptoms will not improve. Feeling depressed, hopeless, or unappreciated. Difficulty sleeping. Difficulty concentrating. Developing stress-related health problems. Feeling like you have too little time for your own life. Follow these instructions at home: Take care of your health Make sure that you and the person you are caring for: Get regular sleep. Exercise regularly. Eat regular, nutritious meals. Take over-the-counter and prescription medicines only   as told by your health care providers. Drink enough fluid to keep your urine pale yellow. Attend all scheduled health care appointments.  General instructions Join a support group with others who are caregivers. Ask about  respite care resources. Respite care can provide short-term care for the person so that you can have a regular break from the stress of caregiving. Consider any safety risks and take steps to avoid them. Organize medicines in a pill box for each day of the week. Create a plan to handle any legal or financial matters. Get legal or financial advice if needed. Keep a calendar in a central location to remind the person of appointments or other activities. Where to find support: Many individuals and organizations offer support. These include: Support groups for people with dementia. Support groups for caregivers. Counselors or therapists. Home health care services. Adult day care centers. Where to find more information Centers for Disease Control and Prevention: www.cdc.gov Alzheimer's Association: www.alz.org Family Caregiver Alliance: www.caregiver.org Alzheimer's Foundation of [place]: www.alzfdn.org Contact a health care provider if: The person's health is rapidly getting worse. You are no longer able to care for the person. Caring for the person is affecting your physical and emotional health. You are feeling depressed or anxious about caring for the person. Get help right away if: The person threatens himself or herself, you, or anyone else. You feel depressed or sad, or feel that you want to harm yourself. If you ever feel like your loved one may hurt himself or herself or others, or if he or she shares thoughts about taking his or her own life, get help right away. You can go to your nearest emergency department or: Call your local emergency services (911 in the [place] Call a suicide crisis helpline, such as the National Suicide Prevention Lifeline at 1-800-273-8255 or 988 in the [place] This is open 24 hours a day in the [place] Text the Crisis Text Line at 741741 (in the [place] Summary Dementia is a term used to describe a number of symptoms that affect memory and thinking. Dementia  usually gets worse slowly over time. Take steps to reduce the person's risk of injury and to plan for future care. Caregivers need support, relief from caregiving, and time for their own lives. This information is not intended to replace advice given to you by your health care provider. Make sure you discuss any questions you have with your health care provider. Document Revised: 06/04/2021 Document Reviewed: 03/25/2020 Elsevier Patient Education  2023 Elsevier Inc.  

## 2022-05-27 NOTE — Telephone Encounter (Signed)
Resent referral for Neuropsychology to Dr. Sima Matas [phone].

## 2022-05-27 NOTE — Telephone Encounter (Signed)
Patient refused appointment with [facility] due to them being out of network, patient looking for an in network provider.

## 2022-05-28 ENCOUNTER — Ambulatory Visit (INDEPENDENT_AMBULATORY_CARE_PROVIDER_SITE_OTHER): Payer: PPO | Admitting: Nurse Practitioner

## 2022-05-28 ENCOUNTER — Encounter: Payer: Self-pay | Admitting: Nurse Practitioner

## 2022-05-28 VITALS — BP 129/74 | HR 56 | Temp 97.9°F | Ht 73.0 in | Wt 225.2 lb

## 2022-05-28 DIAGNOSIS — G3109 Other frontotemporal dementia: Secondary | ICD-10-CM

## 2022-05-28 DIAGNOSIS — F0280 Dementia in other diseases classified elsewhere without behavioral disturbance: Secondary | ICD-10-CM

## 2022-05-28 DIAGNOSIS — I447 Left bundle-branch block, unspecified: Secondary | ICD-10-CM

## 2022-05-28 NOTE — Assessment & Plan Note (Signed)
Followed by cardiology at this time, continue this collaboration.  Recent notes and testing reviewed.  Echo ordered.

## 2022-05-28 NOTE — Progress Notes (Signed)
BP 129/74   Pulse (!) 56   Temp 97.9 F (36.6 C) (Oral)   Ht '6\' 1"'$  (1.854 m)   Wt 225 lb 3.2 oz (102.2 kg)   SpO2 93%   BMI 29.71 kg/m    Subjective:    Patient ID: Christopher Huang, male    DOB: 1951/09/11, 71 y.o.   MRN: [id number]  HPI: Christopher Huang is a 71 y.o. male  Chief Complaint  Patient presents with   Follow-up    Patient wife says patient since patient last in office visit. Patient has had some testing done with Neurologist and Cardiologist and would like to follow up and discuss with provider at today's visit.    Wife is present at bedside to assist with HPI.  SEMANTIC DEMENTIA Follow-up today for new diagnosis of dementia.  Initially presented 03/27/22 for this and imaging was performed 04/15/22 noting asymmetric left temporal lobe atrophy, ?semantic dementia.  Carotid doppler noted minor atherosclerosis, no stenosis.  Had initial visit with neurology on 05/05/22, Dr. Billey Gosling, to continue on Donepezil and perform neuropsychological testing -- they heard from neuropsychological testing but it will cost $1600. Had initial visit with cardiology on 05/11/22, Dr. Rockey Situ, Myoview done noting mildly depressed ejection fraction of 45-50% which they suspect is secondary to his LBBB or PVCs.  His wife and him would like to have echocardiogram as recommended by cardiology to further assess.  His wife endorses that patient does get get fatigued easily.  He denies any SOB, CP, or palpitations.    Currently is tolerating Aricept, he feels this is helping some. His wife and him have signed up for Silver Sneakers, as neurology recommended a regular regimen for activity.  Both of his parents had mild dementia, both lived into upper 53's.    Relevant past medical, surgical, family and social history reviewed and updated as indicated. Interim medical history since our last visit reviewed. Allergies and medications reviewed and updated.  Review of Systems  Constitutional:  Negative for activity  change, diaphoresis, fatigue and fever.  Respiratory:  Negative for cough, chest tightness, shortness of breath and wheezing.   Cardiovascular:  Negative for chest pain, palpitations and leg swelling.  Gastrointestinal: Negative.   Neurological: Negative.   Psychiatric/Behavioral: Negative.      Per HPI unless specifically indicated above     Objective:    BP 129/74   Pulse (!) 56   Temp 97.9 F (36.6 C) (Oral)   Ht '6\' 1"'$  (1.854 m)   Wt 225 lb 3.2 oz (102.2 kg)   SpO2 93%   BMI 29.71 kg/m   Wt Readings from Last 3 Encounters:  05/28/22 225 lb 3.2 oz (102.2 kg)  05/11/22 229 lb 2 oz (103.9 kg)  05/05/22 228 lb (103.4 kg)    Physical Exam Vitals and nursing note reviewed.  Constitutional:      General: He is awake. He is not in acute distress.    Appearance: He is well-developed and well-groomed. He is not ill-appearing or toxic-appearing.  HENT:     Head: Normocephalic and atraumatic.     Right Ear: Hearing normal. No drainage.     Left Ear: Hearing normal. No drainage.  Eyes:     General: Lids are normal.        Right eye: No discharge.        Left eye: No discharge.     Conjunctiva/sclera: Conjunctivae normal.     Pupils: Pupils are equal, round, and reactive  to light.  Neck:     Thyroid: No thyromegaly.     Vascular: No carotid bruit.  Cardiovascular:     Rate and Rhythm: Normal rate and regular rhythm.     Heart sounds: Normal heart sounds, S1 normal and S2 normal. No murmur heard.    No gallop.  Pulmonary:     Effort: Pulmonary effort is normal. No accessory muscle usage or respiratory distress.     Breath sounds: Normal breath sounds.  Abdominal:     General: Bowel sounds are normal.     Palpations: Abdomen is soft. There is no hepatomegaly or splenomegaly.  Musculoskeletal:        General: Normal range of motion.     Cervical back: Normal range of motion and neck supple.     Right lower leg: No edema.     Left lower leg: No edema.  Skin:    General:  Skin is warm and dry.     Capillary Refill: Capillary refill takes less than 2 seconds.  Neurological:     Mental Status: He is alert and oriented to person, place, and time.     Deep Tendon Reflexes: Reflexes are normal and symmetric.     Comments: Oriented to month, day, year.  Psychiatric:        Attention and Perception: Attention normal.        Mood and Affect: Mood normal.        Speech: Speech normal.        Behavior: Behavior normal. Behavior is cooperative.        Thought Content: Thought content normal.    Results for orders placed or performed during the hospital encounter of 05/19/22  NM Myocar Multi W/Spect W/Wall Motion / EF  Result Value Ref Range   Rest HR 56.0 bpm   Rest BP 145/71 mmHg   Peak HR 84 bpm   Peak BP 146/64 mmHg   Percent HR 56.0 %   ST Depression (mm) 0 mm   Rest Nuclear Isotope Dose 10.7 mCi   Stress Nuclear Isotope Dose 30.8 mCi   SSS 22.0    SRS 16.0    SDS 4.0    TID 1.04    LV sys vol 61.0 mL   LV dias vol 180.0 62 - 150 mL   Nuc Stress EF 48 %      Assessment & Plan:   Problem List Items Addressed This Visit       Cardiovascular and Mediastinum   LBBB (left bundle branch block)    Followed by cardiology at this time, continue this collaboration.  Recent notes and testing reviewed.  Echo ordered.      Relevant Orders   ECHOCARDIOGRAM COMPLETE     Nervous and Auditory   Semantic dementia ([place]) - Primary    Diagnosed 04/15/22.  Family history of dementia in mother and father.  At this time continue collaboration with neurology and current Aricept dosing.  Reached out to Dr. Billey Gosling and referral coordinator to see if alternate option available for neuropsychology testing.        Follow up plan: Return in about 5 months (around 10/28/2022) for DEMENTIA, HTN/HLD.

## 2022-05-28 NOTE — Assessment & Plan Note (Signed)
Diagnosed 04/15/22.  Family history of dementia in mother and father.  At this time continue collaboration with neurology and current Aricept dosing.  Reached out to Dr. Billey Gosling and referral coordinator to see if alternate option available for neuropsychology testing.

## 2022-06-01 ENCOUNTER — Other Ambulatory Visit: Payer: Self-pay | Admitting: Psychiatry

## 2022-06-01 DIAGNOSIS — R413 Other amnesia: Secondary | ICD-10-CM

## 2022-06-03 ENCOUNTER — Encounter: Payer: Self-pay | Admitting: Psychology

## 2022-06-03 ENCOUNTER — Telehealth: Payer: Self-pay | Admitting: Psychiatry

## 2022-06-03 NOTE — Telephone Encounter (Signed)
Referral sent to [facility]

## 2022-06-10 DIAGNOSIS — Z96652 Presence of left artificial knee joint: Secondary | ICD-10-CM | POA: Diagnosis not present

## 2022-06-10 DIAGNOSIS — S8002XA Contusion of left knee, initial encounter: Secondary | ICD-10-CM | POA: Diagnosis not present

## 2022-06-26 ENCOUNTER — Other Ambulatory Visit: Payer: Self-pay

## 2022-06-26 ENCOUNTER — Telehealth: Payer: Self-pay

## 2022-06-26 DIAGNOSIS — Z86010 Personal history of colonic polyps: Secondary | ICD-10-CM

## 2022-06-26 NOTE — Telephone Encounter (Signed)
Gastroenterology Pre-Procedure Review  Request Date: 12/01/2022 Requesting Physician: Dr. Allen Norris  PATIENT REVIEW QUESTIONS: The patient responded to the following health history questions as indicated:    1. Are you having any GI issues? yes (occasional constipation) 2. Do you have a personal history of Polyps? yes (11/09/17 Dr. Allen Norris) 3. Do you have a family history of Colon Cancer or Polyps? yes (father colon polyps) 4. Diabetes Mellitus? no 5. Joint replacements in the past 12 months?no 6. Major health problems in the past 3 months?no 7. Any artificial heart valves, MVP, or defibrillator? Seeing Dr.Gollan for irregular heart beat 4 weeks ago Echo next week to compare    MEDICATIONS & ALLERGIES:    Patient reports the following regarding taking any anticoagulation/antiplatelet therapy:   Plavix, Coumadin, Eliquis, Xarelto, Lovenox, Pradaxa, Brilinta, or Effient? no Aspirin? yes (81 mg daily)  Patient confirms/reports the following medications:  Current Outpatient Medications  Medication Sig Dispense Refill   Acetaminophen (ACETAMINOPHEN EXTRA STRENGTH) 500 MG capsule Take 1 capsule by mouth every 6 (six) hours as needed for fever.     amLODipine (NORVASC) 5 MG tablet Take 1 tablet (5 mg total) by mouth daily. 90 tablet 4   ASPIRIN 81 PO Take 81 mg by mouth daily.     Cholecalciferol (VITAMIN D3) 50 MCG (2000 UT) TABS Take 2,000 Units by mouth daily.     Coenzyme Q10 100 MG TABS Take 100 mg by mouth daily.     docusate sodium (COLACE) 100 MG capsule Take 100 mg by mouth daily.     donepezil (ARICEPT) 5 MG tablet Take 1 tablet (5 mg total) by mouth at bedtime. 90 tablet 4   fexofenadine (ALLEGRA ALLERGY) 180 MG tablet Take 1 tablet (180 mg total) by mouth daily. 10 tablet 1   fluticasone (FLONASE) 50 MCG/ACT nasal spray PLACE 2 SPRAYS INTO EACH NOSTRIL DAILY. 48 mL 3   gabapentin (NEURONTIN) 300 MG capsule Take 1 capsule (300 mg total) by mouth at bedtime. One 300 mg tablet once a day   at night  per patient 90 capsule 1   lisinopril (ZESTRIL) 10 MG tablet Take 1 tablet (10 mg total) by mouth every Monday, Tuesday, Wednesday, Thursday, and Friday. 60 tablet 4   meloxicam (MOBIC) 15 MG tablet Take 15 mg by mouth daily.     Multiple Vitamin (MULTIVITAMIN WITH MINERALS) TABS tablet Take 1 tablet by mouth daily. Men's Multivitamin 50+     rosuvastatin (CRESTOR) 40 MG tablet Take 1 tablet (40 mg total) by mouth daily. Stop taking 20 MG tablets. 90 tablet 4   vitamin B-12 (CYANOCOBALAMIN) 500 MCG tablet Take 500 mcg by mouth daily.     No current facility-administered medications for this visit.    Patient confirms/reports the following allergies:  Allergies  Allergen Reactions   Percocet [Oxycodone-Acetaminophen] Nausea And Vomiting   Tramadol Nausea And Vomiting   Vicodin [Hydrocodone-Acetaminophen] Nausea And Vomiting    No orders of the defined types were placed in this encounter.   AUTHORIZATION INFORMATION Primary Insurance: 1D#: Group #:  Secondary Insurance: 1D#: Group #:  SCHEDULE INFORMATION: Date: 12/02/19 Time: Location: [facility]

## 2022-06-29 ENCOUNTER — Telehealth: Payer: PPO | Admitting: Nurse Practitioner

## 2022-06-29 NOTE — Telephone Encounter (Signed)
Pilar called in from [facility] and the patient is scheduled for an echocardiogram tomorrow. She needs for the authorization to be noted in the notes. Please assist further.

## 2022-06-29 NOTE — Telephone Encounter (Signed)
Jessie can you assist with this please? 

## 2022-06-30 ENCOUNTER — Ambulatory Visit (INDEPENDENT_AMBULATORY_CARE_PROVIDER_SITE_OTHER): Payer: PPO

## 2022-06-30 DIAGNOSIS — I447 Left bundle-branch block, unspecified: Secondary | ICD-10-CM | POA: Diagnosis not present

## 2022-06-30 LAB — ECHOCARDIOGRAM COMPLETE
AR max vel: 2.47 cm2
AV Area VTI: 2.54 cm2
AV Area mean vel: 2.3 cm2
AV Mean grad: 5 mmHg
AV Peak grad: 8 mmHg
Ao pk vel: 1.41 m/s
Area-P 1/2: 2.38 cm2
S' Lateral: 4.3 cm

## 2022-06-30 MED ORDER — PERFLUTREN LIPID MICROSPHERE
1.0000 mL | INTRAVENOUS | Status: AC | PRN
  Administered 2022-06-30: 2 mL via INTRAVENOUS

## 2022-06-30 NOTE — Progress Notes (Signed)
Good evening, Christopher Huang had wanted to pursue the echo you had recommended post MyoView to further define ejection fraction.  This is showing reduced EF and moderate LV dysfunction.  I did alert patient to results.  Any steps you would like to take with this or sooner follow-up with you? He does endorse some increased SOB recently, but no other symptoms -- on CCB, wanted to see if you wanted to change this out or continue.  He has recent new diagnosis of dementia, so wife would attend visit with him I am sure.  Thank you:)

## 2022-07-21 DIAGNOSIS — X32XXXA Exposure to sunlight, initial encounter: Secondary | ICD-10-CM | POA: Diagnosis not present

## 2022-07-21 DIAGNOSIS — L858 Other specified epidermal thickening: Secondary | ICD-10-CM | POA: Diagnosis not present

## 2022-07-21 DIAGNOSIS — D2262 Melanocytic nevi of left upper limb, including shoulder: Secondary | ICD-10-CM | POA: Diagnosis not present

## 2022-07-21 DIAGNOSIS — Z85828 Personal history of other malignant neoplasm of skin: Secondary | ICD-10-CM | POA: Diagnosis not present

## 2022-07-21 DIAGNOSIS — D485 Neoplasm of uncertain behavior of skin: Secondary | ICD-10-CM | POA: Diagnosis not present

## 2022-07-21 DIAGNOSIS — D0462 Carcinoma in situ of skin of left upper limb, including shoulder: Secondary | ICD-10-CM | POA: Diagnosis not present

## 2022-07-21 DIAGNOSIS — L570 Actinic keratosis: Secondary | ICD-10-CM | POA: Diagnosis not present

## 2022-07-21 DIAGNOSIS — L821 Other seborrheic keratosis: Secondary | ICD-10-CM | POA: Diagnosis not present

## 2022-07-21 DIAGNOSIS — D2272 Melanocytic nevi of left lower limb, including hip: Secondary | ICD-10-CM | POA: Diagnosis not present

## 2022-07-21 DIAGNOSIS — D2261 Melanocytic nevi of right upper limb, including shoulder: Secondary | ICD-10-CM | POA: Diagnosis not present

## 2022-08-18 DIAGNOSIS — C44629 Squamous cell carcinoma of skin of left upper limb, including shoulder: Secondary | ICD-10-CM | POA: Diagnosis not present

## 2022-08-24 NOTE — Progress Notes (Signed)
Cardiology Office Note  Date:  08/25/2022   ID:  Christopher Huang, DOB 1951-06-24, MRN [id number]  PCP:  Venita Lick, NP   Chief Complaint  Patient presents with   Follow up Echo results     "Doing well." Medications reviewed by the patient verbally.     HPI:  [name] is a 71 yo male with PMH of  Memory loss HTN hyperlipidemia Who presents for follow-up of his sinus bradycardia with LBBB, ejection fraction 30 to 35%  Last seen in clinic June 2023 Noted to have left bundle branch block, Stress test ordered May 19, 2022 mildly depressed ejection fraction 45 to 50%, no significant ischemia noted Calcification noted in left main, aortic atherosclerosis  Echocardiogram performed in follow-up for depressed ejection fraction Left ventricular ejection fraction, by estimation, is 30 to 35%.   Wife reports he has Low stamina, Chronic low back pain, knee pain Sedentary at baseline Trying to get into exercise program but limited by chronic pain  EKG personally reviewed by myself on todays visit Normal sinus rhythm rate 63 bpm left bundle branch block, PACs in a bigeminal pattern  Prior imaging reviewed MR brain 1. Asymmetric left temporal lobe atrophy, question symptoms of semantic dementia. 2. No reversible finding or evidence of recent injury.  Carotid ultrasound Mild carotid disease bilaterally   PMH:   has a past medical history of Allergic rhinitis, Cancer of prostate ([place]) (4/08), History of kidney stones, Hyperlipidemia, Hypertension, and Personal history of kidney stones.  PSH:    Past Surgical History:  Procedure Laterality Date   ANTERIOR LATERAL LUMBAR FUSION WITH PERCUTANEOUS SCREW 1 LEVEL N/A 01/29/2021   Procedure: L4-5 LATERAL INTERBODY FUSION, L4-5 POSTERIOR FUSION;  Surgeon: Meade Maw, MD;  Location: [facility] ORS;  Service: Neurosurgery;  Laterality: N/A;   basal skin cancers     CATARACT EXTRACTION, BILATERAL     2021   COLONOSCOPY WITH  PROPOFOL N/A 11/09/2017   Procedure: COLONOSCOPY WITH PROPOFOL;  Surgeon: Lucilla Lame, MD;  Location: [facility] ENDOSCOPY;  Service: Endoscopy;  Laterality: N/A;   EYE SURGERY Bilateral    Cataract   HAND SURGERY     THUMB SURGERY   KNEE ARTHROSCOPY     prostate cancer removed     TONSILLECTOMY     TONSILLECTOMY     TOTAL KNEE ARTHROPLASTY Left 02/09/2020   Procedure: TOTAL KNEE ARTHROPLASTY - RNFA;  Surgeon: Hessie Knows, MD;  Location: [facility] ORS;  Service: Orthopedics;  Laterality: Left;    Current Outpatient Medications  Medication Sig Dispense Refill   Acetaminophen (ACETAMINOPHEN EXTRA STRENGTH) 500 MG capsule Take 1 capsule by mouth every 6 (six) hours as needed for fever.     ASPIRIN 81 PO Take 81 mg by mouth daily.     carvedilol (COREG) 3.125 MG tablet Take 1 tablet (3.125 mg total) by mouth 2 (two) times daily. 180 tablet 3   Cholecalciferol (VITAMIN D3) 50 MCG (2000 UT) TABS Take 2,000 Units by mouth daily.     Coenzyme Q10 100 MG TABS Take 100 mg by mouth daily.     docusate sodium (COLACE) 100 MG capsule Take 100 mg by mouth daily.     donepezil (ARICEPT) 5 MG tablet Take 1 tablet (5 mg total) by mouth at bedtime. 90 tablet 4   fexofenadine (ALLEGRA ALLERGY) 180 MG tablet Take 1 tablet (180 mg total) by mouth daily. 10 tablet 1   fluticasone (FLONASE) 50 MCG/ACT nasal spray PLACE 2 SPRAYS INTO [facility]  NOSTRIL DAILY. 48 mL 3   gabapentin (NEURONTIN) 300 MG capsule Take 1 capsule (300 mg total) by mouth at bedtime. One 300 mg tablet once a day  at night  per patient 90 capsule 1   meloxicam (MOBIC) 15 MG tablet Take 15 mg by mouth daily.     Multiple Vitamin (MULTIVITAMIN WITH MINERALS) TABS tablet Take 1 tablet by mouth daily. Men's Multivitamin 50+     rosuvastatin (CRESTOR) 40 MG tablet Take 1 tablet (40 mg total) by mouth daily. Stop taking 20 MG tablets. 90 tablet 4   sacubitril-valsartan (ENTRESTO) 24-26 MG Take 1 tablet by mouth 2 (two) times daily. 60 tablet 12   vitamin B-12  (CYANOCOBALAMIN) 500 MCG tablet Take 500 mcg by mouth daily.     No current facility-administered medications for this visit.    Allergies:   Percocet [oxycodone-acetaminophen], Tramadol, and Vicodin [hydrocodone-acetaminophen]   Social History:  The patient  reports that he has never smoked. He has never used smokeless tobacco. He reports that he does not drink alcohol and does not use drugs.   Family History:   family history includes Dementia in his mother; Prostate cancer in his father.    Review of Systems: Review of Systems  Constitutional: Negative.   HENT: Negative.    Respiratory: Negative.    Cardiovascular: Negative.   Gastrointestinal: Negative.   Musculoskeletal:  Positive for back pain and joint pain.  Neurological: Negative.   Psychiatric/Behavioral: Negative.    All other systems reviewed and are negative.   PHYSICAL EXAM: VS:  BP (!) 150/80 (BP Location: Left Arm, Patient Position: Sitting, Cuff Size: Normal)   Pulse 63   Ht '6\' 1"'$  (1.854 m)   Wt 228 lb (103.4 kg)   BMI 30.08 kg/m  , BMI Body mass index is 30.08 kg/m. Constitutional:  oriented to person, place, and time. No distress.  HENT:  Head: Grossly normal Eyes:  no discharge. No scleral icterus.  Neck: No JVD, no carotid bruits  Cardiovascular: Regular rate and rhythm, no murmurs appreciated Pulmonary/Chest: Clear to auscultation bilaterally, no wheezes or rails Abdominal: Soft.  no distension.  no tenderness.  Musculoskeletal: Normal range of motion Neurological:  normal muscle tone. Coordination normal. No atrophy Skin: Skin warm and dry Psychiatric: normal affect, pleasant  Recent Labs: 03/27/2022: ALT 20; BUN 15; Creatinine, Ser 0.97; Hemoglobin 15.4; Platelets 219; Potassium 4.5; Sodium 144; TSH 1.820    Lipid Panel Lab Results  Component Value Date   CHOL 163 03/27/2022   HDL 57 03/27/2022   LDLCALC 77 03/27/2022   TRIG 169 (H) 03/27/2022      Wt Readings from Last 3 Encounters:   08/25/22 228 lb (103.4 kg)  05/28/22 225 lb 3.2 oz (102.2 kg)  05/11/22 229 lb 2 oz (103.9 kg)      ASSESSMENT AND PLAN:  Problem List Items Addressed This Visit       Cardiology Problems   LBBB (left bundle branch block)   Relevant Medications   sacubitril-valsartan (ENTRESTO) 24-26 MG   carvedilol (COREG) 3.125 MG tablet   Other Relevant Orders   EKG 12-Lead   Hyperlipidemia   Relevant Medications   sacubitril-valsartan (ENTRESTO) 24-26 MG   carvedilol (COREG) 3.125 MG tablet   Hypertension - Primary   Relevant Medications   sacubitril-valsartan (ENTRESTO) 24-26 MG   carvedilol (COREG) 3.125 MG tablet   Other Relevant Orders   EKG 12-Lead   Other Visit Diagnoses     Abnormal EKG  Precordial pain       Relevant Orders   EKG 12-Lead   Dilated cardiomyopathy (HCC)       Relevant Medications   sacubitril-valsartan (ENTRESTO) 24-26 MG   carvedilol (COREG) 3.125 MG tablet   Other Relevant Orders   EKG 12-Lead   CT CORONARY MORPH W/CTA COR W/SCORE W/CA W/CM &/OR WO/CM   Basic metabolic panel   Basic metabolic panel   Angina pectoris (HCC)       Relevant Medications   sacubitril-valsartan (ENTRESTO) 24-26 MG   carvedilol (COREG) 3.125 MG tablet   Other Relevant Orders   CT CORONARY MORPH W/CTA COR W/SCORE W/CA W/CM &/OR WO/CM   Basic metabolic panel     Dilated cardiomyopathy  Left bundle branch block, calcium and left main on stress testing showing no high risk ischemia, mildly depressed ejection fraction on Myoview Ejection fraction 30 to 35% on echo Cardiac CTA ordered to definitively exclude underlying ischemia -Recommended he stop amlodipine and lisinopril -Start carvedilol 3.125 twice daily, -Add Entresto 24/26 mg on October 6 BMP in 1 month's time  Hyperlipidemia On Crestor 40, goal LDL less than 70  Essential hypertension Medication changes as above  Carotid disease Minimal nonobstructive disease on ultrasound Aggressive lipid management  recommended   Total encounter time more than 40 minutes  Greater than 50% was spent in counseling and coordination of care with the patient    Signed, Esmond Plants, M.D., Ph.D. [place], [place]

## 2022-08-25 ENCOUNTER — Ambulatory Visit: Payer: PPO | Attending: Cardiovascular Disease | Admitting: Cardiovascular Disease

## 2022-08-25 ENCOUNTER — Encounter: Payer: Self-pay | Admitting: Cardiovascular Disease

## 2022-08-25 VITALS — BP 150/80 | HR 63 | Ht 73.0 in | Wt 228.0 lb

## 2022-08-25 DIAGNOSIS — R9431 Abnormal electrocardiogram [ECG] [EKG]: Secondary | ICD-10-CM

## 2022-08-25 DIAGNOSIS — E782 Mixed hyperlipidemia: Secondary | ICD-10-CM

## 2022-08-25 DIAGNOSIS — I447 Left bundle-branch block, unspecified: Secondary | ICD-10-CM

## 2022-08-25 DIAGNOSIS — I10 Essential (primary) hypertension: Secondary | ICD-10-CM | POA: Diagnosis not present

## 2022-08-25 DIAGNOSIS — I209 Angina pectoris, unspecified: Secondary | ICD-10-CM

## 2022-08-25 DIAGNOSIS — R072 Precordial pain: Secondary | ICD-10-CM

## 2022-08-25 DIAGNOSIS — I420 Dilated cardiomyopathy: Secondary | ICD-10-CM | POA: Diagnosis not present

## 2022-08-25 MED ORDER — CARVEDILOL 3.125 MG PO TABS: 3.1250 mg | ORAL_TABLET | Freq: Two times a day (BID) | ORAL | 3 refills | Status: DC

## 2022-08-25 MED ORDER — ENTRESTO 24-26 MG PO TABS
1.0000 | ORAL_TABLET | Freq: Two times a day (BID) | ORAL | 12 refills | Status: DC
Start: 2022-08-25 — End: 2022-11-25

## 2022-08-25 NOTE — Patient Instructions (Addendum)
We will order a cardiac CTA for angina, cardiomyopathy  Medication Instructions:   Please stop the amlodipine and lisinopril Start coreg 3.125 mg twice a day  On Friday, start entresto 24/26 mg twice a day  If you need a refill on your cardiac medications before your next appointment, please call your pharmacy.   Lab work: You need a BMP 1 week prior to your CT scan.  BMP in one month in the hospital  Testing/Procedures:   Your cardiac CT will be scheduled at one of the below locations:   [facility] [address] [place], [place] [address] (336) [facility] [place], [place] [address] [phone]  If scheduled at [facility] or [facility], please arrive 15 mins early for check-in and test prep.   Please follow these instructions carefully (unless otherwise directed):  Hold all erectile dysfunction medications at least 3 days (72 hrs) prior to test. (Ie viagra, cialis, sildenafil, tadalafil, etc) We will administer nitroglycerin during this exam.   On the Night Before the Test: Be sure to Drink plenty of water. Do not consume any caffeinated/decaffeinated beverages or chocolate 12 hours prior to your test. Do not take any antihistamines 12 hours prior to your test.  On the Day of the Test: Drink plenty of water until 1 hour prior to the test. Do not eat any food 1 hour prior to test. You may take your regular medications prior to the test.  Take Coreg (Carvedilol) two hours prior to test. If your heart rate is greater than 55 beats per minute 2 hours prior to your CT take an extra dose.  After the Test: Drink plenty of water. After receiving IV contrast, you may experience a mild flushed feeling. This is normal. On occasion, you may experience a mild rash up to 24 hours after the test. This is not dangerous. If this  occurs, you can take Benadryl 25 mg and increase your fluid intake. If you experience trouble breathing, this can be serious. If it is severe call 911 IMMEDIATELY. If it is mild, please call our office.   We will call to schedule your test 2-4 weeks out understanding that some insurance companies will need an authorization prior to the service being performed.   For non-scheduling related questions, please contact the cardiac imaging nurse navigator should you have any questions/concerns: Marchia Bond, Cardiac Imaging Nurse Navigator Gordy Clement, Cardiac Imaging Nurse Navigator [facility] Direct Office Dial: [phone]   For scheduling needs, including cancellations and rescheduling, please call [place], [phone].   Follow-Up: At [facility], you and your health needs are our priority.  As part of our continuing mission to provide you with exceptional heart care, we have created designated Provider Care Teams.  These Care Teams include your primary Cardiologist (physician) and Advanced Practice Providers (APPs -  Physician Assistants and Nurse Practitioners) who all work together to provide you with the care you need, when you need it.  You will need a follow up appointment in 3 months  Providers on your designated Care Team:   Michiels Hodgkins, NP Christell Faith, PA-C Cadence Kathlen Mody, [place]  COVID-19 Vaccine Information can be found at: [email or web address] For questions related to vaccine distribution or appointments, please email [email or web address] or call [phone].

## 2022-09-17 ENCOUNTER — Other Ambulatory Visit: Payer: Self-pay | Admitting: Nurse Practitioner

## 2022-09-18 NOTE — Telephone Encounter (Signed)
last RF 04/27/22 #90 1 RF  Requested Prescriptions  Refused Prescriptions Disp Refills  . gabapentin (NEURONTIN) 300 MG capsule [Pharmacy Med Name: GABAPENTIN 300 MG CAPSULE] 90 capsule 1    Sig: TAKE 1 CAPSULE BY MOUTH AT BEDTIME.     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 [place]

## 2022-09-27 ENCOUNTER — Other Ambulatory Visit: Payer: Self-pay | Admitting: Nurse Practitioner

## 2022-09-28 NOTE — Telephone Encounter (Signed)
Dc'd by J. Cannady NP 03/27/22 Requested Prescriptions  Refused Prescriptions Disp Refills   meloxicam (MOBIC) 7.5 MG tablet [Pharmacy Med Name: MELOXICAM 7.5 MG TABLET] 90 tablet 4    Sig: TAKE 1 TABLET BY MOUTH EVERY DAY     Analgesics:  COX2 Inhibitors Failed - 09/27/2022  8:47 AM      Failed - Manual Review: Labs are only required if the patient has taken medication for more than 8 weeks.      Passed - HGB in normal range and within 360 days    Hemoglobin  Date Value Ref Range Status  03/27/2022 15.4 13.0 - 17.7 g/dL Final         Passed - Cr in normal range and within 360 days    Creatinine, Ser  Date Value Ref Range Status  03/27/2022 0.97 0.76 - 1.27 mg/dL Final         Passed - HCT in normal range and within 360 days    Hematocrit  Date Value Ref Range Status  03/27/2022 45.2 37.5 - 51.0 % Final         Passed - AST in normal range and within 360 days    AST  Date Value Ref Range Status  03/27/2022 21 0 - 40 IU/L Final   AST (SGOT) Piccolo, Waived  Date Value Ref Range Status  07/15/2017 31 11 - 38 U/L Final         Passed - ALT in normal range and within 360 days    ALT  Date Value Ref Range Status  03/27/2022 20 0 - 44 IU/L Final   ALT (SGPT) Piccolo, Waived  Date Value Ref Range Status  07/15/2017 21 10 - 47 U/L Final         Passed - eGFR is 30 or above and within 360 days    GFR calc Af Amer  Date Value Ref Range Status  08/16/2020 103 >59 mL/min/1.73 Final    Comment:    **[facility] currently reports eGFR in compliance with the current**   recommendations of the [employer]. [facility] will   update reporting as new guidelines are published from the NKF-ASN   Task force.    GFR, Estimated  Date Value Ref Range Status  12/03/2020 >60 >60 mL/min Final    Comment:    (NOTE) Calculated using the CKD-EPI Creatinine Equation (2021)    eGFR  Date Value Ref Range Status  03/27/2022 83 >59 mL/min/1.73 Final         Passed - Patient  is not pregnant      Passed - Valid encounter within last 12 months    Recent Outpatient Visits           4 months ago Semantic dementia ([place])   [place], Jolene T, NP   5 months ago Upper respiratory tract infection, unspecified type   [facility] Vigg, Avanti, MD   6 months ago Primary hypertension   [place], [place] T, NP   9 months ago Acute sinusitis, recurrence not specified, unspecified location   [facility] Vigg, Avanti, MD   10 months ago Primary hypertension   [place] Vigg, Customer service manager, MD       Future Appointments             In 1 month Cannady, Barbaraann Faster, NP [employer] [facility]   In 1 month Gollan, Kathlene November, MD [facility]  [facility] [place]. [place]

## 2022-09-30 ENCOUNTER — Telehealth (HOSPITAL_COMMUNITY): Payer: Self-pay | Admitting: Emergency Medicine

## 2022-09-30 NOTE — Telephone Encounter (Signed)
Reaching out to patient to offer assistance regarding upcoming cardiac imaging study; pt verbalizes understanding of appt date/time, parking situation and where to check in, pre-test NPO status and medications ordered, and verified current allergies; name and call back number provided for further questions should they arise Christopher Bond RN Navigator Cardiac Imaging [facility] [phone] office [phone] cell  Arrival [address] [place] entrance Denies iv issues  Daily meds, holding allergy meds

## 2022-10-01 ENCOUNTER — Other Ambulatory Visit: Payer: PPO

## 2022-10-01 DIAGNOSIS — H524 Presbyopia: Secondary | ICD-10-CM | POA: Diagnosis not present

## 2022-10-01 DIAGNOSIS — H43813 Vitreous degeneration, bilateral: Secondary | ICD-10-CM | POA: Diagnosis not present

## 2022-10-01 DIAGNOSIS — Z961 Presence of intraocular lens: Secondary | ICD-10-CM | POA: Diagnosis not present

## 2022-10-01 DIAGNOSIS — H35373 Puckering of macula, bilateral: Secondary | ICD-10-CM | POA: Diagnosis not present

## 2022-10-02 ENCOUNTER — Ambulatory Visit (HOSPITAL_COMMUNITY)
Admission: RE | Admit: 2022-10-02 | Discharge: 2022-10-02 | Disposition: A | Discharge status: Home / Self Care | Payer: PPO | Source: Ambulatory Visit | Attending: Cardiovascular Disease | Admitting: Cardiovascular Disease

## 2022-10-02 DIAGNOSIS — I209 Angina pectoris, unspecified: Secondary | ICD-10-CM

## 2022-10-02 DIAGNOSIS — I420 Dilated cardiomyopathy: Secondary | ICD-10-CM | POA: Diagnosis not present

## 2022-10-02 MED ORDER — NITROGLYCERIN 0.4 MG SL SUBL
SUBLINGUAL_TABLET | SUBLINGUAL | Status: AC
  Filled 2022-10-02: qty 2

## 2022-10-02 MED ORDER — NITROGLYCERIN 0.4 MG SL SUBL
0.8000 mg | SUBLINGUAL_TABLET | Freq: Once | SUBLINGUAL | Status: AC
  Administered 2022-10-02: 0.8 mg via SUBLINGUAL

## 2022-10-02 MED ORDER — METOPROLOL TARTRATE 5 MG/5ML IV SOLN: 5.0000 mg | INTRAVENOUS | Status: DC | PRN

## 2022-10-02 MED ORDER — METOPROLOL TARTRATE 5 MG/5ML IV SOLN
INTRAVENOUS | Status: AC
  Administered 2022-10-02: 2.5 mg via INTRAVENOUS
  Filled 2022-10-02: qty 5

## 2022-10-02 MED ORDER — IOHEXOL 350 MG/ML SOLN
100.0000 mL | Freq: Once | INTRAVENOUS | Status: AC | PRN
  Administered 2022-10-02: 100 mL via INTRAVENOUS

## 2022-10-17 ENCOUNTER — Other Ambulatory Visit: Payer: Self-pay | Admitting: Nurse Practitioner

## 2022-10-20 NOTE — Telephone Encounter (Signed)
Requested Prescriptions  Pending Prescriptions Disp Refills   gabapentin (NEURONTIN) 300 MG capsule [Pharmacy Med Name: GABAPENTIN 300 MG CAPSULE] 90 capsule 1    Sig: TAKE 1 CAPSULE BY MOUTH AT BEDTIME.     Neurology: Anticonvulsants - gabapentin Passed - 10/17/2022  8:59 AM      Passed - Cr in normal range and within 360 days    Creatinine, Ser  Date Value Ref Range Status  03/27/2022 0.97 0.76 - 1.27 mg/dL Final         Passed - Completed PHQ-2 or PHQ-9 in the last 360 days      Passed - Valid encounter within last 12 months    Recent Outpatient Visits           4 months ago Semantic dementia ([place])   [place], Jolene T, NP   5 months ago Upper respiratory tract infection, unspecified type   [place] Vigg, Avanti, MD   6 months ago Primary hypertension   [place], [place] T, NP   10 months ago Acute sinusitis, recurrence not specified, unspecified location   [facility] Vigg, Avanti, MD   10 months ago Primary hypertension   [place] Vigg, Avanti, MD       Future Appointments             In 1 week Cannady, Barbaraann Faster, NP [employer] [facility]   In 1 month Gollan, Kathlene November, MD [place]. [place]

## 2022-10-23 ENCOUNTER — Ambulatory Visit
Admission: RE | Admit: 2022-10-23 | Discharge: 2022-10-23 | Disposition: A | Discharge status: Home / Self Care | Payer: Self-pay | Source: Ambulatory Visit | Attending: Neurosurgery | Admitting: Neurosurgery

## 2022-10-23 ENCOUNTER — Other Ambulatory Visit: Payer: Self-pay

## 2022-10-23 DIAGNOSIS — Z049 Encounter for examination and observation for unspecified reason: Secondary | ICD-10-CM

## 2022-10-25 DIAGNOSIS — I5020 Unspecified systolic (congestive) heart failure: Secondary | ICD-10-CM | POA: Insufficient documentation

## 2022-10-25 NOTE — Patient Instructions (Incomplete)
Mild Neurocognitive Disorder Mild neurocognitive disorder, formerly known as mild cognitive impairment, is a disorder in which memory does not work as well as it should. This disorder may also cause problems with other mental functions, including thought, communication, behavior, and completion of tasks. These problems can be noticed and measured, but they usually do not interfere with daily activities or the ability to live independently. Mild neurocognitive disorder typically develops after 72 years of age, but it can also develop at younger ages. It is not as serious as major neurocognitive disorder, also known as dementia, but it may be the first sign of it. Generally, symptoms of this condition get worse over time. In rare cases, symptoms can get better. What are the causes? This condition may be caused by: Brain disorders like Alzheimer's disease, Parkinson's disease, and other conditions that gradually damage nerve cells (neurodegenerative conditions). Diseases that affect blood vessels in the brain and result in small strokes. Certain infections, such as HIV. Traumatic brain injury. Other medical conditions, such as brain tumors, underactive thyroid (hypothyroidism), and vitamin B12 deficiency. Use of certain drugs or prescription medicines. What increases the risk? The following factors may make you more likely to develop this condition: Being older than 65 years. Being male. Low education level. Diabetes, high blood pressure, high cholesterol, and other conditions that increase the risk for blood vessel diseases. Untreated or undertreated sleep apnea. Having a certain type of gene that can be passed from parent to child (inherited). Chronic health problems such as heart disease, lung disease, liver disease, kidney disease, or depression. What are the signs or symptoms? Symptoms of this condition include: Difficulty remembering. You may: Forget names, phone numbers, or details of  recent events. Forget social events and appointments. Repeatedly forget where you put your car keys or other items. Difficulty thinking and solving problems. You may have trouble with complex tasks, such as: Paying bills. Driving in unfamiliar places. Difficulty communicating. You may have trouble: Finding the right word or naming an object. Forming a sentence that makes sense, or understanding what you read or hear. Changes in your behavior or personality. When this happens, you may: Lose interest in the things that you used to enjoy. Withdraw from social situations. Get angry more easily than usual. Act before thinking. How is this diagnosed? This condition is diagnosed based on: Your symptoms. Your health care provider may ask you and the people you spend time with, such as family and friends, about your symptoms. Evaluation of mental functions (neuropsychological testing). Your health care provider may refer you to a neurologist or mental health specialist to evaluate your mental functions in detail. To identify the cause of your condition, your health care provider may: Get a detailed medical history. Ask about use of alcohol, drugs, and prescription medicines. Do a physical exam. Order blood tests and brain imaging exams. How is this treated? Mild neurocognitive disorder that is caused by medicine use, drug use, infection, or another medical condition may improve when the cause is treated, or when medicines or drugs are stopped. If this disorder has another cause, it generally does not improve and may get worse. In these cases, the goal of treatment is to help you manage the loss of mental function. Treatments in these cases include: Medicine. Medicine mainly helps memory and behavior symptoms. Talk therapy. Talk therapy provides education, emotional support, memory aids, and other ways of making up for problems with mental function. Lifestyle changes, including: Getting regular  exercise. Eating a healthy diet   that includes omega-3 fatty acids. Challenging your thinking and memory skills. Having more social interaction. Follow these instructions at home: Eating and drinking  Drink enough fluid to keep your urine pale yellow. Eat a healthy diet that includes omega-3 fatty acids. These can be found in: Fish. Nuts. Leafy vegetables. Vegetable oils. If you drink alcohol: Limit how much you use to: 0-1 drink a day for women. 0-2 drinks a day for men. Be aware of how much alcohol is in your drink. In the [place] one drink equals one 12 oz bottle of beer (355 mL), one 5 oz glass of wine (148 mL), or one 1 oz glass of hard liquor (44 mL). Lifestyle  Get regular exercise as told by your health care provider. Do not use any products that contain nicotine or tobacco, such as cigarettes, e-cigarettes, and chewing tobacco. If you need help quitting, ask your health care provider. Practice ways to manage stress. If you need help managing stress, ask your health care provider. Continue to have social interaction. Keep your mind active with stimulating activities you enjoy, such as reading or playing games. Make sure to get quality sleep. Follow these tips: Avoid napping during the day. Keep your sleeping area dark and cool. Avoid exercising during the few hours before you go to bed. Avoid caffeine products in the evening. General instructions Take over-the-counter and prescription medicines only as told by your health care provider. Your health care provider may recommend that you avoid taking medicines that can affect thinking, such as pain medicines or sleep medicines. Work with your health care provider to find out what you need help with and what your safety needs are. Keep all follow-up visits. This is important. Where to find more information National Institute on Aging: www.nia.nih.gov Contact a health care provider if: You have any new symptoms. Get help right  away if: You develop new confusion or your confusion gets worse. You act in ways that place you or your family in danger. Summary Mild neurocognitive disorder is a disorder in which memory does not work as well as it should. Mild neurocognitive disorder can have many causes. It may be the first stage of dementia. To manage your condition, get regular exercise, keep your mind active, get quality sleep, and eat a healthy diet. This information is not intended to replace advice given to you by your health care provider. Make sure you discuss any questions you have with your health care provider. Document Revised: 03/25/2020 Document Reviewed: 03/25/2020 Elsevier Patient Education  2023 [employer]vier Inc.  

## 2022-10-26 ENCOUNTER — Other Ambulatory Visit: Payer: Self-pay | Admitting: Nurse Practitioner

## 2022-10-27 NOTE — Telephone Encounter (Signed)
Medication d/c'd 09/26/2021. Listed as historical.  Requested Prescriptions  Pending Prescriptions Disp Refills   meloxicam (MOBIC) 7.5 MG tablet [Pharmacy Med Name: MELOXICAM 7.5 MG TABLET] 90 tablet 4    Sig: TAKE 1 TABLET BY MOUTH EVERY DAY     Analgesics:  COX2 Inhibitors Failed - 10/26/2022 10:09 AM      Failed - Manual Review: Labs are only required if the patient has taken medication for more than 8 weeks.      Passed - HGB in normal range and within 360 days    Hemoglobin  Date Value Ref Range Status  03/27/2022 15.4 13.0 - 17.7 g/dL Final         Passed - Cr in normal range and within 360 days    Creatinine, Ser  Date Value Ref Range Status  03/27/2022 0.97 0.76 - 1.27 mg/dL Final         Passed - HCT in normal range and within 360 days    Hematocrit  Date Value Ref Range Status  03/27/2022 45.2 37.5 - 51.0 % Final         Passed - AST in normal range and within 360 days    AST  Date Value Ref Range Status  03/27/2022 21 0 - 40 IU/L Final   AST (SGOT) Piccolo, Waived  Date Value Ref Range Status  07/15/2017 31 11 - 38 U/L Final         Passed - ALT in normal range and within 360 days    ALT  Date Value Ref Range Status  03/27/2022 20 0 - 44 IU/L Final   ALT (SGPT) Piccolo, Waived  Date Value Ref Range Status  07/15/2017 21 10 - 47 U/L Final         Passed - eGFR is 30 or above and within 360 days    GFR calc Af Amer  Date Value Ref Range Status  08/16/2020 103 >59 mL/min/1.73 Final    Comment:    **[facility] currently reports eGFR in compliance with the current**   recommendations of the [employer]. [facility] will   update reporting as new guidelines are published from the NKF-ASN   Task force.    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

## 2022-10-28 ENCOUNTER — Encounter: Payer: Self-pay | Admitting: Nurse Practitioner

## 2022-10-28 ENCOUNTER — Ambulatory Visit (INDEPENDENT_AMBULATORY_CARE_PROVIDER_SITE_OTHER): Payer: PPO | Admitting: Nurse Practitioner

## 2022-10-28 VITALS — BP 136/78 | HR 60 | Temp 98.3°F | Resp 18 | Ht 72.99 in | Wt 227.7 lb

## 2022-10-28 DIAGNOSIS — E559 Vitamin D deficiency, unspecified: Secondary | ICD-10-CM

## 2022-10-28 DIAGNOSIS — I5020 Unspecified systolic (congestive) heart failure: Secondary | ICD-10-CM | POA: Diagnosis not present

## 2022-10-28 DIAGNOSIS — F0280 Dementia in other diseases classified elsewhere without behavioral disturbance: Secondary | ICD-10-CM | POA: Diagnosis not present

## 2022-10-28 DIAGNOSIS — I10 Essential (primary) hypertension: Secondary | ICD-10-CM | POA: Diagnosis not present

## 2022-10-28 DIAGNOSIS — I447 Left bundle-branch block, unspecified: Secondary | ICD-10-CM

## 2022-10-28 DIAGNOSIS — E782 Mixed hyperlipidemia: Secondary | ICD-10-CM | POA: Diagnosis not present

## 2022-10-28 DIAGNOSIS — G3109 Other frontotemporal dementia: Secondary | ICD-10-CM | POA: Diagnosis not present

## 2022-10-28 NOTE — Assessment & Plan Note (Signed)
Ongoing and taking supplement at home, continue this and check Vit D today.

## 2022-10-28 NOTE — Assessment & Plan Note (Signed)
Followed by cardiology at this time, continue this collaboration.  Recent notes and testing reviewed.

## 2022-10-28 NOTE — Assessment & Plan Note (Signed)
Diagnosed 04/15/22, stable at this time.  Family history of dementia in mother and father.  At this time continue collaboration with neurology and current Aricept dosing.

## 2022-10-28 NOTE — Progress Notes (Addendum)
BP 136/78 (BP Location: Left Arm, Patient Position: Sitting, Cuff Size: Normal)   Pulse 60   Temp 98.3 F (36.8 C) (Oral)   Resp 18   Ht 6' 0.99" (1.854 m)   Wt 227 lb 11.2 oz (103.3 kg)   SpO2 97%   BMI 30.05 kg/m    Subjective:    Patient ID: Christopher Huang, male    DOB: 08-27-1951, 71 y.o.   MRN: [id number]  HPI: Christopher Huang is a 71 y.o. male  Chief Complaint  Patient presents with   Hypertension   Hyperlipidemia   Dementia   Wife present at bedside  HYPERTENSION / HYPERLIPIDEMIA/HF Initial visit with cardiology on 05/11/22, Dr. Rockey Situ, Myoview done noting mildly depressed ejection fraction of 45-50% which they suspect is secondary to his LBBB or PVCs.  Follow-up with them on 08/25/22, they started Entresto and Carvedilol due to reduced EF.  Is tolerating these medications well, but Delene Loll is costly every month.  Discussed need to stop Meloxicam due to HF being present.   Satisfied with current treatment? yes Duration of hypertension: chronic BP monitoring frequency: rarely BP range: 120/80 range BP medication side effects: no Duration of hyperlipidemia: chronic Cholesterol medication side effects: no Cholesterol supplements: none Medication compliance: good compliance Aspirin: yes Recent stressors: no Recurrent headaches: no Visual changes: no Palpitations: no Dyspnea:  occasional with activity Chest pain: no Lower extremity edema: no Dizzy/lightheaded: no   DEMENTIA: Initial presentation 03/27/22 and imaging was performed 04/15/22 noting asymmetric left temporal lobe atrophy, ?semantic dementia.  Carotid doppler noted minor atherosclerosis, no stenosis.  Initial visit with neurology on 05/05/22, Dr. Billey Gosling, to continue on Donepezil and perform neuropsychological testing (scheduled for March). Tolerating Aricept, he feels this is helping some. They are doing Silver Sneakers.  Both of his parents had mild dementia, both lived into upper 38's.  His wife and him  report no recent changes in memory.  Relevant past medical, surgical, family and social history reviewed and updated as indicated. Interim medical history since our last visit reviewed. Allergies and medications reviewed and updated.  Review of Systems  Constitutional:  Negative for activity change, appetite change, chills, fatigue and fever.  Respiratory:  Positive for shortness of breath (occasional with activity). Negative for cough, chest tightness and wheezing.   Cardiovascular:  Negative for chest pain, palpitations and leg swelling.  Gastrointestinal: Negative.   Neurological: Negative.   Psychiatric/Behavioral: Negative.     Per HPI unless specifically indicated above     Objective:    BP 136/78 (BP Location: Left Arm, Patient Position: Sitting, Cuff Size: Normal)   Pulse 60   Temp 98.3 F (36.8 C) (Oral)   Resp 18   Ht 6' 0.99" (1.854 m)   Wt 227 lb 11.2 oz (103.3 kg)   SpO2 97%   BMI 30.05 kg/m   Wt Readings from Last 3 Encounters:  10/28/22 227 lb 11.2 oz (103.3 kg)  08/25/22 228 lb (103.4 kg)  05/28/22 225 lb 3.2 oz (102.2 kg)    Physical Exam Vitals and nursing note reviewed.  Constitutional:      General: He is awake. He is not in acute distress.    Appearance: He is well-developed and well-groomed. He is not ill-appearing or toxic-appearing.  HENT:     Head: Normocephalic and atraumatic.     Right Ear: Hearing normal. No drainage.     Left Ear: Hearing normal. No drainage.  Eyes:     General: Lids are normal.  Right eye: No discharge.        Left eye: No discharge.     Conjunctiva/sclera: Conjunctivae normal.     Pupils: Pupils are equal, round, and reactive to light.  Neck:     Thyroid: No thyromegaly.     Vascular: No carotid bruit.  Cardiovascular:     Rate and Rhythm: Normal rate and regular rhythm.     Heart sounds: Normal heart sounds, S1 normal and S2 normal. No murmur heard.    No gallop.     Comments: Occasional extra beats noted on  auscultation. Pulmonary:     Effort: Pulmonary effort is normal. No accessory muscle usage or respiratory distress.     Breath sounds: Normal breath sounds.  Abdominal:     General: Bowel sounds are normal.     Palpations: Abdomen is soft. There is no hepatomegaly or splenomegaly.  Musculoskeletal:        General: Normal range of motion.     Cervical back: Normal range of motion and neck supple.     Right lower leg: No edema.     Left lower leg: No edema.  Skin:    General: Skin is warm and dry.     Capillary Refill: Capillary refill takes less than 2 seconds.  Neurological:     Mental Status: He is alert and oriented to person, place, and time.     Deep Tendon Reflexes: Reflexes are normal and symmetric.     Comments: Oriented to month, day, year.  Psychiatric:        Attention and Perception: Attention normal.        Mood and Affect: Mood normal.        Speech: Speech normal.        Behavior: Behavior normal. Behavior is cooperative.        Thought Content: Thought content normal.     Results for orders placed or performed in visit on 06/30/22  ECHOCARDIOGRAM COMPLETE  Result Value Ref Range   AR max vel 2.47 cm2   AV Peak grad 8.0 mmHg   Ao pk vel 1.41 m/s   S' Lateral 4.30 cm   Area-P 1/2 2.38 cm2   AV Area VTI 2.54 cm2   AV Mean grad 5.0 mmHg   AV Area mean vel 2.30 cm2      Assessment & Plan:   Problem List Items Addressed This Visit       Cardiovascular and Mediastinum   Heart failure with reduced ejection fraction ([place])    New diagnosis on 06/30/22 with EF 30-35%.  Followed by cardiology with recent medication changes which he has tolerated.  Euvolemic today.  Educated his wife and him on HF.  Recommend: - Reminded to call for an overnight weight gain of >2 pounds or a weekly weight gain of >5 pounds - not adding salt to food and read food labels. Reviewed the importance of keeping daily sodium intake to '2000mg'$  daily.  - No NSAIDS, stop Meloxicam       Relevant Orders   AMB Referral to Chronic Care Management Services   Hypertension    Chronic, ongoing.  BP at goal today.  Continue current medication regimen and collaboration with cardiology.  Recommend he monitor BP at least a few mornings a week at home and document.  DASH diet at home.  Labs today: CBC, CMP.         Relevant Orders   CBC with Differential/Platelet   AMB Referral to Chronic  Care Management Services   LBBB (left bundle branch block)    Followed by cardiology at this time, continue this collaboration.  Recent notes and testing reviewed.          Nervous and Auditory   Frontotemporal dementia ([place]) - Primary    Diagnosed 04/15/22, stable at this time.  Family history of dementia in mother and father.  At this time continue collaboration with neurology and current Aricept dosing.       Relevant Orders   AMB Referral to Chronic Care Management Services     Other   Hyperlipidemia    Chronic, ongoing.  Continue Rosuvastatin daily.  Adjust dose as needed.  Obtain labs today: CMP and Lipid.        Relevant Orders   Comprehensive metabolic panel   Lipid Panel w/o Chol/HDL Ratio   AMB Referral to Chronic Care Management Services   Vitamin D deficiency    Ongoing and taking supplement at home, continue this and check Vit D today.      Relevant Orders   VITAMIN D 25 Hydroxy (Vit-D Deficiency, Fractures)     Follow up plan: Return in about 5 months (around 03/29/2023) for Annual physical due after 03/28/23.

## 2022-10-28 NOTE — Assessment & Plan Note (Signed)
New diagnosis on 06/30/22 with EF 30-35%.  Followed by cardiology with recent medication changes which he has tolerated.  Euvolemic today.  Educated his wife and him on HF.  Recommend: - Reminded to call for an overnight weight gain of >2 pounds or a weekly weight gain of >5 pounds - not adding salt to food and read food labels. Reviewed the importance of keeping daily sodium intake to '2000mg'$  daily.  - No NSAIDS, stop Meloxicam

## 2022-10-28 NOTE — Assessment & Plan Note (Signed)
Chronic, ongoing.  BP at goal today.  Continue current medication regimen and collaboration with cardiology.  Recommend he monitor BP at least a few mornings a week at home and document.  DASH diet at home.  Labs today: CBC, CMP.

## 2022-10-28 NOTE — Assessment & Plan Note (Signed)
Chronic, ongoing.  Continue Rosuvastatin daily.  Adjust dose as needed.  Obtain labs today: CMP and Lipid.

## 2022-10-29 ENCOUNTER — Ambulatory Visit
Admission: RE | Admit: 2022-10-29 | Discharge: 2022-10-29 | Disposition: A | Discharge status: Home / Self Care | Payer: PPO | Attending: Neurosurgery | Admitting: Neurosurgery

## 2022-10-29 ENCOUNTER — Encounter: Payer: Self-pay | Admitting: Neurosurgery

## 2022-10-29 ENCOUNTER — Ambulatory Visit
Admission: RE | Admit: 2022-10-29 | Discharge: 2022-10-29 | Disposition: A | Discharge status: Home / Self Care | Payer: PPO | Source: Ambulatory Visit | Attending: Neurosurgery | Admitting: Neurosurgery

## 2022-10-29 ENCOUNTER — Ambulatory Visit (INDEPENDENT_AMBULATORY_CARE_PROVIDER_SITE_OTHER): Payer: PPO | Admitting: Neurosurgery

## 2022-10-29 VITALS — BP 142/78 | Ht 72.99 in | Wt 226.2 lb

## 2022-10-29 DIAGNOSIS — M545 Low back pain, unspecified: Secondary | ICD-10-CM | POA: Insufficient documentation

## 2022-10-29 DIAGNOSIS — Z981 Arthrodesis status: Secondary | ICD-10-CM | POA: Insufficient documentation

## 2022-10-29 DIAGNOSIS — M4316 Spondylolisthesis, lumbar region: Secondary | ICD-10-CM | POA: Diagnosis not present

## 2022-10-29 LAB — CBC WITH DIFFERENTIAL/PLATELET
Basophils Absolute: 0.1 10*3/uL (ref 0.0–0.2)
Basos: 1 %
EOS (ABSOLUTE): 0.2 10*3/uL (ref 0.0–0.4)
Eos: 4 %
Hematocrit: 45.1 % (ref 37.5–51.0)
Hemoglobin: 15.1 g/dL (ref 13.0–17.7)
Immature Grans (Abs): 0 10*3/uL (ref 0.0–0.1)
Immature Granulocytes: 0 %
Lymphocytes Absolute: 1.1 10*3/uL (ref 0.7–3.1)
Lymphs: 21 %
MCH: 30.9 pg (ref 26.6–33.0)
MCHC: 33.5 g/dL (ref 31.5–35.7)
MCV: 92 fL (ref 79–97)
Monocytes Absolute: 0.4 10*3/uL (ref 0.1–0.9)
Monocytes: 7 %
Neutrophils Absolute: 3.5 10*3/uL (ref 1.4–7.0)
Neutrophils: 67 %
Platelets: 209 10*3/uL (ref 150–450)
RBC: 4.89 x10E6/uL (ref 4.14–5.80)
RDW: 13 % (ref 11.6–15.4)
WBC: 5.3 10*3/uL (ref 3.4–10.8)

## 2022-10-29 LAB — COMPREHENSIVE METABOLIC PANEL
ALT: 30 IU/L (ref 0–44)
AST: 27 IU/L (ref 0–40)
Albumin/Globulin Ratio: 3.2 — ABNORMAL HIGH (ref 1.2–2.2)
Albumin: 4.5 g/dL (ref 3.8–4.8)
Alkaline Phosphatase: 88 IU/L (ref 44–121)
BUN/Creatinine Ratio: 18 (ref 10–24)
BUN: 15 mg/dL (ref 8–27)
Bilirubin Total: 0.5 mg/dL (ref 0.0–1.2)
CO2: 27 mmol/L (ref 20–29)
Calcium: 9.3 mg/dL (ref 8.6–10.2)
Chloride: 102 mmol/L (ref 96–106)
Creatinine, Ser: 0.84 mg/dL (ref 0.76–1.27)
Globulin, Total: 1.4 g/dL — ABNORMAL LOW (ref 1.5–4.5)
Glucose: 105 mg/dL — ABNORMAL HIGH (ref 70–99)
Potassium: 4.4 mmol/L (ref 3.5–5.2)
Sodium: 141 mmol/L (ref 134–144)
Total Protein: 5.9 g/dL — ABNORMAL LOW (ref 6.0–8.5)
eGFR: 93 mL/min/{1.73_m2} (ref >59)

## 2022-10-29 LAB — VITAMIN D 25 HYDROXY (VIT D DEFICIENCY, FRACTURES): Vit D, 25-Hydroxy: 33 ng/mL (ref 30.0–100.0)

## 2022-10-29 LAB — LIPID PANEL W/O CHOL/HDL RATIO
Cholesterol, Total: 131 mg/dL (ref 100–199)
HDL: 55 mg/dL (ref >39)
LDL Chol Calc (NIH): 53 mg/dL (ref 0–99)
Triglycerides: 132 mg/dL (ref 0–149)
VLDL Cholesterol Cal: 23 mg/dL (ref 5–40)

## 2022-10-29 NOTE — Progress Notes (Signed)
Contacted via MyChart   Good evening Christopher Huang, your labs have returned and overall remain stable with no changes needed to medications.  Great news!!  Any questions? Keep being amazing!!  Thank you for allowing me to participate in your care.  I appreciate you. Kindest regards, Barlow Harrison

## 2022-10-29 NOTE — Progress Notes (Signed)
Follow-up note: Referring Physician:  Venita Lick, NP [address] [place],  [place] [address]  Primary Physician:  Venita Lick, NP  DOS: 01/29/2021 (L4-5 XLIF PSF)   Chief Complaint:  continued back pain  History of Present Illness: Christopher Huang is a 71 y.o. male with a history of previous lumbar fusion,  who presents with the chief complaint of ongoing low back pain.  He states that he has really had low back pain since the surgery but over the last 3 to 4 months it is worsened.  He was recently diagnosed with a new heart condition and has stopped his anti-inflammatory medications.  He describes aching/throbbing low back pain without radiation into his buttocks or legs.  This is worse with standing and stooping activities like raking leaves and improves with laying flat.  He continues to take gabapentin and Tylenol as needed.  He denies any radiating leg pain.  LOV 01/08/22 Christopher Huang is status post L4-5 XLIF. He is off pain medications. He has done very well with surgery.   Did have a fall while vacuuming a few weeks ago. He has some neck soreness.  Review of Systems:  A 10 point review of systems is negative, and the pertinent positives and negatives detailed in the HPI.  Past Medical History: Past Medical History:  Diagnosis Date   Allergic rhinitis    Cancer of prostate ([place]) 4/08   s/p surgery   History of kidney stones    H/O   Hyperlipidemia    Hypertension    Personal history of kidney stones     Past Surgical History: Past Surgical History:  Procedure Laterality Date   ANTERIOR LATERAL LUMBAR FUSION WITH PERCUTANEOUS SCREW 1 LEVEL N/A 01/29/2021   Procedure: L4-5 LATERAL INTERBODY FUSION, L4-5 POSTERIOR FUSION;  Surgeon: Meade Maw, MD;  Location: [facility] ORS;  Service: Neurosurgery;  Laterality: N/A;   basal skin cancers     CATARACT EXTRACTION, BILATERAL     2021   COLONOSCOPY WITH PROPOFOL N/A 11/09/2017   Procedure: COLONOSCOPY WITH  PROPOFOL;  Surgeon: Lucilla Lame, MD;  Location: [facility] ENDOSCOPY;  Service: Endoscopy;  Laterality: N/A;   EYE SURGERY Bilateral    Cataract   HAND SURGERY     THUMB SURGERY   KNEE ARTHROSCOPY     prostate cancer removed     TONSILLECTOMY     TONSILLECTOMY     TOTAL KNEE ARTHROPLASTY Left 02/09/2020   Procedure: TOTAL KNEE ARTHROPLASTY - RNFA;  Surgeon: Hessie Knows, MD;  Location: [facility] ORS;  Service: Orthopedics;  Laterality: Left;    Allergies: Allergies as of 10/29/2022 - Review Complete 10/28/2022  Allergen Reaction Noted   Percocet [oxycodone-acetaminophen] Nausea And Vomiting 03/27/2015   Tramadol Nausea And Vomiting 03/27/2015   Vicodin [hydrocodone-acetaminophen] Nausea And Vomiting 03/27/2015    Medications: Outpatient Encounter Medications as of 10/29/2022  Medication Sig   Acetaminophen (ACETAMINOPHEN EXTRA STRENGTH) 500 MG capsule Take 1 capsule by mouth every 6 (six) hours as needed for fever.   ASPIRIN 81 PO Take 81 mg by mouth daily.   carvedilol (COREG) 3.125 MG tablet Take 1 tablet (3.125 mg total) by mouth 2 (two) times daily.   Cholecalciferol (VITAMIN D3) 50 MCG (2000 UT) TABS Take 2,000 Units by mouth daily.   Coenzyme Q10 100 MG TABS Take 100 mg by mouth daily.   docusate sodium (COLACE) 100 MG capsule Take 100 mg by mouth daily.   donepezil (ARICEPT) 5 MG tablet Take  1 tablet (5 mg total) by mouth at bedtime.   fexofenadine (ALLEGRA ALLERGY) 180 MG tablet Take 1 tablet (180 mg total) by mouth daily.   fluticasone (FLONASE) 50 MCG/ACT nasal spray PLACE 2 SPRAYS INTO EACH NOSTRIL DAILY.   gabapentin (NEURONTIN) 300 MG capsule TAKE 1 CAPSULE BY MOUTH AT BEDTIME.   Multiple Vitamin (MULTIVITAMIN WITH MINERALS) TABS tablet Take 1 tablet by mouth daily. Men's Multivitamin 50+   rosuvastatin (CRESTOR) 40 MG tablet Take 1 tablet (40 mg total) by mouth daily. Stop taking 20 MG tablets.   sacubitril-valsartan (ENTRESTO) 24-26 MG Take 1 tablet by mouth 2 (two) times  daily.   vitamin B-12 (CYANOCOBALAMIN) 500 MCG tablet Take 500 mcg by mouth daily.   [DISCONTINUED] meloxicam (MOBIC) 15 MG tablet Take 15 mg by mouth daily.   No facility-administered encounter medications on file as of 10/29/2022.    Social History: Social History   Tobacco Use   Smoking status: Never   Smokeless tobacco: Never  Vaping Use   Vaping Use: Never used  Substance Use Topics   Alcohol use: No   Drug use: No    Family Medical History: Family History  Problem Relation Age of Onset   Dementia Mother    Prostate cancer Father     Exam:  Today's Vitals   10/29/22 1407  BP: (!) 142/78  Weight: 102.6 kg  Height: 6' 0.99" (1.854 m)  PainSc: 2   PainLoc: Back   Body mass index is 29.85 kg/m.   General: A&Ox3.  ROM of spine: Limited flexion extension of the lumbar spine.  Palpation of spine: NTTP.  Strength in the left lower extremity is EHL 5/5, Dorsiflexion 5/5, Plantar flexion 5/5, Hamstring 5/5, Quadricep 5/5, Iliopsoas 5/5. Strength in the right lower extremity is EHL 5/5, Dorsiflexion 5/5, Plantar flexion 5/5, Hamstring 5/5, Quadricep 5/5, Iliopsoas 5/5. Reflexes are 1+ and symmetric at the patella and achilles.   Bilateral lower extremity sensation is intact to light touch.  Clonus is negative  Toes down-going.  Ambulates with a flexed antalgic gait.  Imaging:  No recent imaging to review   Assessment and Plan: Christopher Huang is a pleasant 71 y.o. male with a history of previous lumbar fusion and ongoing back pain worse over the last 3 to 4 months without any radicular complaints.  He has recently come off of his anti-inflammatory medication due to a new cardiac diagnosis.  While this may be the reason for his increased pain, I would like to obtain dynamic x-rays to further rule out any hardware complication and to evaluate for adjacent level disease.  Should this be largely negative we will likely attempt conservative treatment with physical therapy.  If this  fails to improve his symptoms or worsens his back pain, we will move forward with further evaluation with an MRI and possible CT scan.  I will contact him via telephone visit for review of his x-rays.  He expressed understanding was in agreement with this plan.  I spent a total of 26 minutes in both face-to-face and non-face-to-face activities for this visit on the date of this encounter including review of x-rays, review of previous imaging, discussion of symptoms, physical exam, documentation, and order placement.  Cooper Render PA-C Neurosurgery

## 2022-11-03 ENCOUNTER — Ambulatory Visit: Payer: PPO | Admitting: Neurosurgery

## 2022-11-03 DIAGNOSIS — M545 Low back pain, unspecified: Secondary | ICD-10-CM

## 2022-11-03 NOTE — Progress Notes (Signed)
I spoke with Christopher Huang about his xray results (see below).  Discussed next best treatment options and he would like to move forward with an MRI with the intention of considering injections.  He was offered physical therapy but declined at this time.  I will contact him via telephone visit with the results of his MRI and further plan of care once completed.  He expressed understanding was in agreement with this plan.  10/29/22 lumbar flex/ex Narrative & Impress   FINDINGS: Standing lateral flexion and extension views only obtained. Previous imaging demonstrates partially lumbarized S1. Posterior hardware is at L4-L5 consistent with prior exam. The hardware is intact on these lateral views. There is an interbody spacer at L4-L5.   5 mm anterolisthesis of L3 on L4 is unchanged on flexion and extension. Trace retrolisthesis of L2 on L3 is also unchanged. Progressive L2-L3 disc space loss from prior exam. Slight increase in L3-L4 disc space loss. Moderate L2-L3 and mild L3-L4 facet hypertrophy. No vertebral body compression fracture.   IMPRESSION: 1. Grade 1 anterolisthesis of L3 on L4, unchanged on flexion and extension. 2. Trace retrolisthesis of L2 on L3 is also unchanged on flexion and extension. 3. Posterior L4-L5 fusion hardware, intact on the lateral views. 4. Degenerative disc disease and facet hypertrophy as described.     Electronically Signed   By: Keith Rake M.D.   On: 10/31/2022 01:17

## 2022-11-09 ENCOUNTER — Telehealth: Payer: Self-pay

## 2022-11-09 NOTE — Progress Notes (Signed)
  Chronic Care Management   Note  11/09/2022 Name: Christopher Huang MRN: [id number] DOB: 08/28/1951  Christopher Huang is a 71 y.o. year old male who is a primary care patient of Cannady, Barbaraann Faster, NP. I reached out to Christopher Huang by phone today in response to a referral sent by Christopher Huang's PCP.  Christopher Huang  agreedto scheduling an appointment with the CCM RN Case Manager   Follow up plan: Patient agreed to scheduled appointment with RN Case Manager on 11/27/2022(date/time).   Noreene Larsson, [place], [place] [address] Direct Dial: [phone] [email or web address]

## 2022-11-10 ENCOUNTER — Ambulatory Visit: Payer: PPO | Admitting: Psychiatry

## 2022-11-11 ENCOUNTER — Ambulatory Visit
Admission: RE | Admit: 2022-11-11 | Discharge: 2022-11-11 | Disposition: A | Discharge status: Home / Self Care | Payer: PPO | Source: Ambulatory Visit | Attending: Neurosurgery | Admitting: Neurosurgery

## 2022-11-11 ENCOUNTER — Encounter (INDEPENDENT_AMBULATORY_CARE_PROVIDER_SITE_OTHER): Payer: PPO | Admitting: Psychiatry

## 2022-11-11 DIAGNOSIS — R413 Other amnesia: Secondary | ICD-10-CM | POA: Diagnosis not present

## 2022-11-11 DIAGNOSIS — M545 Low back pain, unspecified: Secondary | ICD-10-CM | POA: Diagnosis not present

## 2022-11-11 DIAGNOSIS — M5116 Intervertebral disc disorders with radiculopathy, lumbar region: Secondary | ICD-10-CM | POA: Diagnosis not present

## 2022-11-11 DIAGNOSIS — M4316 Spondylolisthesis, lumbar region: Secondary | ICD-10-CM | POA: Diagnosis not present

## 2022-11-11 NOTE — Telephone Encounter (Signed)

## 2022-11-11 NOTE — Telephone Encounter (Signed)
Pt was last seen in June and has been taking medications since, what do you recommend ?

## 2022-11-24 ENCOUNTER — Telehealth: Payer: Self-pay | Admitting: *Deleted

## 2022-11-24 MED ORDER — NA SULFATE-K SULFATE-MG SULF 17.5-3.13-1.6 GM/177ML PO SOLN: 1.0000 | Freq: Once | ORAL | 0 refills | Status: AC

## 2022-11-24 NOTE — Progress Notes (Unsigned)
Cardiology Office Note  Date:  11/25/2022   ID:  Christopher Huang, DOB 02-04-1951, MRN [id number]  PCP:  Venita Lick, NP   Chief Complaint  Patient presents with   Follow-up    Patient reports feeling intermittent pressure on Christopher Huang left temple since starting carvedilol and Entresto. The pain typically starts in the afternoon and lasts for several hours. Tylenol seems to ease the pain. Christopher Huang would also like to discuss whether or not it is safe for him to get epidural injections in Christopher Huang back. Wife also would like to discuss PAC's. Christopher Huang states Christopher Huang does not feel them but was told that they were present by medical staff when Christopher Huang went to have Christopher Huang cardiac CT performed in [place].    HPI:  Christopher Huang is a 72 yo male with PMH of  Memory loss HTN Hyperlipidemia Left bundle branch block Cardiomyopathy, ejection fraction 45 to 50% Who presents for follow-up of Christopher Huang sinus bradycardia with LBBB, ejection fraction 30 to 35%  Last seen in clinic October 2023 In follow-up today reports having some periodic discomfort in Christopher Huang left temple area above the left eyebrow Held the Aricept for 1 week and symptoms seem to improved but restarted Aricept, symptoms have recurred  Was told Christopher Huang had PACs during Christopher Huang cardiac CTA, asymptomatic Denies significant PND, orthopnea, no leg swelling Has not started going back to the gym yet, sedentary Low stamina  Recent imaging detailed below Cardiac CTA 1.  Coronary calcium score of 0. 2.  Normal coronary origin with right dominance. 3. Nonobstructive CAD with mixed plaque in proximal LAD causing minimal (0-24%) stenosis 4.  Mild aortic valve calcifications (AV calcium score 224) 5.  Mild mitral annular calcifications 6.  Dilated ascending aorta measuring 8m  Echo 8/23 1. Left ventricular ejection fraction, by estimation, is 30 to 35%. The  left ventricle has moderate to severely decreased function. The left  ventricle demonstrates global hypokinesis. The left  ventricular internal  cavity size was mildly dilated. Left  ventricular diastolic parameters are consistent with Grade I diastolic  dysfunction (impaired relaxation).   2. Right ventricular systolic function is low normal. The right  ventricular size is normal.   3. The mitral valve is normal in structure. No evidence of mitral valve  regurgitation.   4. The aortic valve is tricuspid. Aortic valve regurgitation is mild.  Aortic valve sclerosis is present, with no evidence of aortic valve  stenosis.    Stress test ordered May 19, 2022 mildly depressed ejection fraction 45 to 50%, no significant ischemia noted Calcification noted in left main, aortic atherosclerosis  Echocardiogram  Left ventricular ejection fraction, by estimation, is 30 to 35%.   Prior imaging reviewed MR brain 1. Asymmetric left temporal lobe atrophy, question symptoms of semantic dementia. 2. No reversible finding or evidence of recent injury.  Carotid ultrasound Mild carotid disease bilaterally   PMH:   has a past medical history of Allergic rhinitis, Cancer of prostate ([place] (4/08), History of kidney stones, Hyperlipidemia, Hypertension, and Personal history of kidney stones.  PSH:    Past Surgical History:  Procedure Laterality Date   ANTERIOR LATERAL LUMBAR FUSION WITH PERCUTANEOUS SCREW 1 LEVEL N/A 01/29/2021   Procedure: L4-5 LATERAL INTERBODY FUSION, L4-5 POSTERIOR FUSION;  Surgeon: YMeade Maw MD;  Location: [facility] ORS;  Service: Neurosurgery;  Laterality: N/A;   basal skin cancers     CATARACT EXTRACTION, BILATERAL     2021   COLONOSCOPY WITH PROPOFOL N/A 11/09/2017  Procedure: COLONOSCOPY WITH PROPOFOL;  Surgeon: Lucilla Lame, MD;  Location: [facility]COPY;  Service: Endoscopy;  Laterality: N/A;   EYE SURGERY Bilateral    Cataract   HAND SURGERY     THUMB SURGERY   KNEE ARTHROSCOPY     prostate cancer removed     TONSILLECTOMY     TONSILLECTOMY     TOTAL KNEE ARTHROPLASTY Left  02/09/2020   Procedure: TOTAL KNEE ARTHROPLASTY - RNFA;  Surgeon: Hessie Knows, MD;  Location: [facility] ORS;  Service: Orthopedics;  Laterality: Left;    Current Outpatient Medications  Medication Sig Dispense Refill   Acetaminophen (ACETAMINOPHEN EXTRA STRENGTH) 500 MG capsule Take 1 capsule by mouth every 6 (six) hours as needed for fever.     ASPIRIN 81 PO Take 81 mg by mouth daily.     carvedilol (COREG) 3.125 MG tablet Take 1 tablet (3.125 mg total) by mouth 2 (two) times daily. 180 tablet 3   Cholecalciferol (VITAMIN D3) 50 MCG (2000 UT) TABS Take 2,000 Units by mouth daily.     Coenzyme Q10 100 MG TABS Take 100 mg by mouth daily.     docusate sodium (COLACE) 100 MG capsule Take 100 mg by mouth daily.     donepezil (ARICEPT) 5 MG tablet Take 1 tablet (5 mg total) by mouth at bedtime. 90 tablet 4   fexofenadine (ALLEGRA ALLERGY) 180 MG tablet Take 1 tablet (180 mg total) by mouth daily. 10 tablet 1   fluticasone (FLONASE) 50 MCG/ACT nasal spray PLACE 2 SPRAYS INTO EACH NOSTRIL DAILY. 48 mL 3   gabapentin (NEURONTIN) 300 MG capsule TAKE 1 CAPSULE BY MOUTH AT BEDTIME. 90 capsule 1   Multiple Vitamin (MULTIVITAMIN WITH MINERALS) TABS tablet Take 1 tablet by mouth daily. Men's Multivitamin 50+     rosuvastatin (CRESTOR) 40 MG tablet Take 1 tablet (40 mg total) by mouth daily. Stop taking 20 MG tablets. 90 tablet 4   sacubitril-valsartan (ENTRESTO) 24-26 MG Take 1 tablet by mouth 2 (two) times daily. 60 tablet 12   vitamin B-12 (CYANOCOBALAMIN) 500 MCG tablet Take 500 mcg by mouth daily.     No current facility-administered medications for this visit.    Allergies:   Percocet [oxycodone-acetaminophen], Tramadol, and Vicodin [hydrocodone-acetaminophen]   Social History:  The patient  reports that Christopher Huang has never smoked. Christopher Huang has never used smokeless tobacco. Christopher Huang reports that Christopher Huang does not drink alcohol and does not use drugs.   Family History:   family history includes Dementia in Christopher Huang mother; Prostate  cancer in Christopher Huang father.    Review of Systems: Review of Systems  Constitutional: Negative.   HENT: Negative.    Respiratory: Negative.    Cardiovascular: Negative.   Gastrointestinal: Negative.   Musculoskeletal:  Positive for back pain and joint pain.  Neurological: Negative.   Psychiatric/Behavioral: Negative.    All other systems reviewed and are negative.   PHYSICAL EXAM: VS:  BP (!) 148/72 (BP Location: Left Arm, Patient Position: Sitting, Cuff Size: Normal)   Pulse 67   Ht '6\' 1"'$  (1.854 m)   Wt 228 lb (103.4 kg)   SpO2 97%   BMI 30.08 kg/m  , BMI Body mass index is 30.08 kg/m. Constitutional:  oriented to person, place, and time. No distress.  HENT:  Head: Grossly normal Eyes:  no discharge. No scleral icterus.  Neck: No JVD, no carotid bruits  Cardiovascular: Regular rate and rhythm, no murmurs appreciated Pulmonary/Chest: Clear to auscultation bilaterally, no wheezes or rails Abdominal: Soft.  no distension.  no tenderness.  Musculoskeletal: Normal range of motion Neurological:  normal muscle tone. Coordination normal. No atrophy Skin: Skin warm and dry Psychiatric: normal affect, pleasant  Recent Labs: 03/27/2022: TSH 1.820 10/28/2022: ALT 30; BUN 15; Creatinine, Ser 0.84; Hemoglobin 15.1; Platelets 209; Potassium 4.4; Sodium 141    Lipid Panel Lab Results  Component Value Date   CHOL 131 10/28/2022   HDL 55 10/28/2022   LDLCALC 53 10/28/2022   TRIG 132 10/28/2022      Wt Readings from Last 3 Encounters:  11/25/22 228 lb (103.4 kg)  10/29/22 226 lb 3.2 oz (102.6 kg)  10/28/22 227 lb 11.2 oz (103.3 kg)     ASSESSMENT AND PLAN:  Problem List Items Addressed This Visit       Cardiology Problems   Hyperlipidemia   Hypertension   LBBB (left bundle branch block)   Other Visit Diagnoses     Coronary artery disease of native artery of native heart with stable angina pectoris (HCC)    -  Primary   Dilated cardiomyopathy ([place])         Dilated  cardiomyopathy  Nonischemic cardiomyopathy, cardiac CTA with no significant stenosis Ejection fraction 30 to 35% on echo Recommend Christopher Huang continue carvedilol 3.125 twice daily, We will increase Entresto up to 49/51 mg twice daily In  follow-up could add spironolactone  Hyperlipidemia On Crestor 40,  Cholesterol at goal  Essential hypertension Higher dose Entresto as above  Carotid disease Minimal nonobstructive disease on ultrasound Aggressive lipid management recommended  Memory loss Reports on Aricept, unclear if Christopher Huang left temporal discomfort is secondary to Aricept but Christopher Huang reports symptoms improved by holding the medication Recommend Christopher Huang talk with neurology, perhaps there is an alternative such as Namenda or Exelon patch   Total encounter time more than 30 minutes  Greater than 50% was spent in counseling and coordination of care with the patient    Signed, Esmond Plants, M.D., Ph.D. [place], [place]

## 2022-11-24 NOTE — Telephone Encounter (Signed)
Patient's spouse called office because they needed the prep solution sent to the patient's pharmacy.  Sent Suprep to [facility] pharmacy in [place] for patient as requested

## 2022-11-25 ENCOUNTER — Encounter: Payer: Self-pay | Admitting: Cardiovascular Disease

## 2022-11-25 ENCOUNTER — Ambulatory Visit: Payer: PPO | Attending: Cardiovascular Disease | Admitting: Cardiovascular Disease

## 2022-11-25 VITALS — BP 148/72 | HR 67 | Ht 73.0 in | Wt 228.0 lb

## 2022-11-25 DIAGNOSIS — I10 Essential (primary) hypertension: Secondary | ICD-10-CM

## 2022-11-25 DIAGNOSIS — I420 Dilated cardiomyopathy: Secondary | ICD-10-CM | POA: Diagnosis not present

## 2022-11-25 DIAGNOSIS — E782 Mixed hyperlipidemia: Secondary | ICD-10-CM | POA: Diagnosis not present

## 2022-11-25 DIAGNOSIS — I25118 Atherosclerotic heart disease of native coronary artery with other forms of angina pectoris: Secondary | ICD-10-CM | POA: Diagnosis not present

## 2022-11-25 DIAGNOSIS — I447 Left bundle-branch block, unspecified: Secondary | ICD-10-CM | POA: Diagnosis not present

## 2022-11-25 MED ORDER — SACUBITRIL-VALSARTAN 49-51 MG PO TABS: 1.0000 | ORAL_TABLET | Freq: Two times a day (BID) | ORAL | 6 refills | Status: DC

## 2022-11-25 NOTE — Patient Instructions (Addendum)
Medication Instructions:  Please increase the Entresto up to 49/51 mg twice a day  If you need a refill on your cardiac medications before your next appointment, please call your pharmacy.   Lab work: No new labs needed  Testing/Procedures: No new testing needed  Follow-Up: At [facility], you and your health needs are our priority.  As part of our continuing mission to provide you with exceptional heart care, we have created designated Provider Care Teams.  These Care Teams include your primary Cardiologist (physician) and Advanced Practice Providers (APPs -  Physician Assistants and Nurse Practitioners) who all work together to provide you with the care you need, when you need it.  You will need a follow up appointment in 3 months  Providers on your designated Care Team:   Dimare Hodgkins, NP Christell Faith, PA-C Cadence Kathlen Mody, [place]  COVID-19 Vaccine Information can be found at: [email or web address].uk For questions related to vaccine distribution or appointments, please email [email or web address] or call [phone].

## 2022-11-26 ENCOUNTER — Ambulatory Visit (INDEPENDENT_AMBULATORY_CARE_PROVIDER_SITE_OTHER): Payer: PPO | Admitting: Neurosurgery

## 2022-11-26 DIAGNOSIS — G8929 Other chronic pain: Secondary | ICD-10-CM

## 2022-11-26 DIAGNOSIS — M545 Low back pain, unspecified: Secondary | ICD-10-CM | POA: Diagnosis not present

## 2022-11-26 DIAGNOSIS — M47819 Spondylosis without myelopathy or radiculopathy, site unspecified: Secondary | ICD-10-CM | POA: Diagnosis not present

## 2022-11-26 NOTE — Progress Notes (Signed)
Neurosurgery Telephone (Audio-Only) Note  Requesting Provider     Venita Lick, NP [address] [place],  [place] [address] T: [phone] F: [phone]  Primary Care Provider Venita Lick, NP [place] [address] T: [phone] F: [phone]  Telehealth visit was conducted with Christopher Huang, a 72 y.o. male via telephone.  History of Present Illness: Christopher Huang is a 72 year old male presenting today via telephone visit to discuss his MRI results.  He was last seen on 10/29/2022 with complaints of increased low back pain without any radicular symptoms.  Today he reports continued low back pain without radicular pain.  10/29/22 Christopher Huang is a 72 y.o. male with a history of previous lumbar fusion,  who presents with the chief complaint of ongoing low back pain.  He states that he has really had low back pain since the surgery but over the last 3 to 4 months it is worsened.  He was recently diagnosed with a new heart condition and has stopped his anti-inflammatory medications.  He describes aching/throbbing low back pain without radiation into his buttocks or legs.  This is worse with standing and stooping activities like raking leaves and improves with laying flat.  He continues to take gabapentin and Tylenol as needed.  He denies any radiating leg pain.   General Review of Systems:  A ROS was performed including pertinent positive and negatives as documented.  All other systems are negative.   Prior to Admission medications   Medication Sig Start Date End Date Taking? Authorizing Provider  Acetaminophen (ACETAMINOPHEN EXTRA STRENGTH) 500 MG capsule Take 1 capsule by mouth every 6 (six) hours as needed for fever.    [provider]  ASPIRIN 81 PO Take 81 mg by mouth daily.    [provider]  carvedilol (COREG) 3.125 MG tablet Take 1 tablet (3.125 mg total) by mouth 2 (two) times daily. 08/25/22 08/20/23  Minna Merritts, MD   Cholecalciferol (VITAMIN D3) 50 MCG (2000 UT) TABS Take 2,000 Units by mouth daily.    [provider]  Coenzyme Q10 100 MG TABS Take 100 mg by mouth daily.    [provider]  docusate sodium (COLACE) 100 MG capsule Take 100 mg by mouth daily.    [provider]  donepezil (ARICEPT) 5 MG tablet Take 1 tablet (5 mg total) by mouth at bedtime. 03/27/22   Cannady, Henrine Screws T, NP  fexofenadine (ALLEGRA ALLERGY) 180 MG tablet Take 1 tablet (180 mg total) by mouth daily. 12/05/21   Vigg, Avanti, MD  fluticasone (FLONASE) 50 MCG/ACT nasal spray PLACE 2 SPRAYS INTO EACH NOSTRIL DAILY. 02/17/22   Marnee Guarneri T, NP  gabapentin (NEURONTIN) 300 MG capsule TAKE 1 CAPSULE BY MOUTH AT BEDTIME. 10/20/22   Jon Billings, NP  Multiple Vitamin (MULTIVITAMIN WITH MINERALS) TABS tablet Take 1 tablet by mouth daily. Men's Multivitamin 50+    [provider]  rosuvastatin (CRESTOR) 40 MG tablet Take 1 tablet (40 mg total) by mouth daily. Stop taking 20 MG tablets. 03/29/22   Cannady, Henrine Screws T, NP  sacubitril-valsartan (ENTRESTO) 49-51 MG Take 1 tablet by mouth 2 (two) times daily. 11/25/22   Minna Merritts, MD  vitamin B-12 (CYANOCOBALAMIN) 500 MCG tablet Take 500 mcg by mouth daily.    [provider]    DATA REVIEWED    Imaging Studies  MRI L spine 11/14/22  IMPRESSION: 1. Laminectomy and fusion at L4-5 without residual or recurrent  stenosis. 2. Progressive adjacent level disease at L3-4 with moderate to severe central canal stenosis, crowding of the nerve roots, and left greater than right subarticular stenosis. 3. Mild foraminal narrowing bilaterally at L3-4 has progressed. 4. Moderate left neural foraminal stenosis at L5-S1 is stable. 5. Chronic loss of disc height and moderate facet hypertrophy at L2-3 with mild crowding of the nerve roots centrally and moderate foraminal stenosis bilaterally, right greater than left.     Electronically Signed   By:  San Morelle M.D.   On: 11/14/2022 17:20  IMPRESSION  Christopher Huang is a 72 y.o. male who I performed a telephone encounter today for evaluation and management of: Facet arthropathy, back pain, and spinal stenosis  PLAN  Spoke with Christopher Huang today and reviewed his MRI results which show adjacent level disease particularly at L3-4 and L2-3.  While he does have spinal stenosis at this level, his primary complaint is low back pain without radiculopathy or symptoms concerning for neurogenic claudication.  I suspect he is likely having pain due to facet arthropathy.  I have discussed this with him and he would like to go for injections.  I have placed a referral to Dr. Elwyn Lade office for consideration of facet injections.  I will see him back via telephone visit in 4 to 6 weeks to review his progress.  He was encouraged to call the office in the interim should he have any questions or concerns.  He expressed understanding was in agreement with this plan.  No orders of the defined types were placed in this encounter.   DISPOSITION  Follow up: In person appointment in 6 weeks  Loleta Dicker, PA   TELEPHONE DOCUMENTATION   This visit was performed via telephone.  Patient location: home Provider location: office  I spent a total of 5 minutes non-face-to-face activities for this visit on the date of this encounter including review of current clinical condition, response to treatment, and image review.

## 2022-11-27 ENCOUNTER — Telehealth: Payer: PPO

## 2022-11-27 ENCOUNTER — Ambulatory Visit (INDEPENDENT_AMBULATORY_CARE_PROVIDER_SITE_OTHER): Payer: PPO

## 2022-11-27 DIAGNOSIS — F0280 Dementia in other diseases classified elsewhere without behavioral disturbance: Secondary | ICD-10-CM

## 2022-11-27 DIAGNOSIS — I10 Essential (primary) hypertension: Secondary | ICD-10-CM

## 2022-11-27 DIAGNOSIS — I5020 Unspecified systolic (congestive) heart failure: Secondary | ICD-10-CM

## 2022-11-27 NOTE — Plan of Care (Signed)
Chronic Care Management Provider Comprehensive Care Plan    11/27/2022 Name: Christopher Huang MRN: [id number] DOB: 1951-06-23  Referral to Chronic Care Management (CCM) services was placed by Provider:  Marnee Guarneri on Date: 10-28-2022.  Chronic Condition 1: HF Provider Assessment and Plan  New diagnosis on 06/30/22 with EF 30-35%.  Followed by cardiology with recent medication changes which he has tolerated.  Euvolemic today.  Educated his wife and him on HF.  Recommend: - Reminded to call for an overnight weight gain of >2 pounds or a weekly weight gain of >5 pounds - not adding salt to food and read food labels. Reviewed the importance of keeping daily sodium intake to '2000mg'$  daily.  - No NSAIDS, stop Meloxicam         Relevant Orders    AMB Referral to Chronic Care Management Services     Expected Outcome/Goals Addressed This Visit (Provider CCM goals/Provider Assessment and plan   CCM (Heart Failure)  EXPECTED OUTCOME:  MONITOR,SELF- MANAGE AND REDUCE SYMPTOMS OF Heart Failure   Symptom Management Condition 1: Take all medications as prescribed Attend all scheduled provider appointments Call provider office for new concerns or questions  call the Suicide and Crisis Lifeline: 988 call the [place] National Suicide Prevention Lifeline: 580-154-9059 or TTY: (406) 615-5084 [place] (629) 474-4249) to talk to a trained counselor call 1-800-273-TALK (toll free, 24 hour hotline) if experiencing a Mental Health or [place]  call office if I gain more than 2 pounds in one day or 5 pounds in one week use salt in moderation watch for swelling in feet, ankles and legs every day weigh myself daily develop a rescue plan follow rescue plan if symptoms flare-up eat more whole grains, fruits and vegetables, lean meats and healthy fats track symptoms and what helps feel better or worse dress right for the weather, hot or cold  Chronic Condition 2: HTN Provider Assessment and Plan    Chronic, ongoing.  BP at goal today.  Continue current medication regimen and collaboration with cardiology.  Recommend he monitor BP at least a few mornings a week at home and document.  DASH diet at home.  Labs today: CBC, CMP.             Relevant Orders    CBC with Differential/Platelet    AMB Referral to Chronic Care Management Services     Expected Outcome/Goals Addressed This Visit (Provider CCM goals/Provider Assessment and plan   CCM (HYPERTENSION)  EXPECTED OUTCOME:  MONITOR,SELF- MANAGE AND REDUCE SYMPTOMS OF HYPERTENSION   Symptom Management Condition 2: Take all medications as prescribed Attend all scheduled provider appointments Call provider office for new concerns or questions  call the Suicide and Crisis Lifeline: 988 call the [place] National Suicide Prevention Lifeline: 712-220-7087 or TTY: 773-834-1619 [place] 4056655123) to talk to a trained counselor call 1-800-273-TALK (toll free, 24 hour hotline) if experiencing a Mental Health or [place]  check blood pressure weekly learn about high blood pressure call doctor for signs and symptoms of high blood pressure keep all doctor appointments take medications for blood pressure exactly as prescribed report new symptoms to your doctor eat more whole grains, fruits and vegetables, lean meats and healthy fats  Chronic Condition 3: Dementia  Provider Assessment and Plan  Diagnosed 04/15/22, stable at this time.  Family history of dementia in mother and father.  At this time continue collaboration with neurology and current Aricept dosing.          Relevant  Orders    AMB Referral to Chronic Care Management Services         Expected Outcome/Goals Addressed This Visit (Provider CCM goals/Provider Assessment and plan   CCM (Dementia)  EXPECTED OUTCOME:  MONITOR,SELF- MANAGE AND REDUCE SYMPTOMS OF Dementia   Symptom Management Condition 3: Take all medications as prescribed Attend all scheduled  provider appointments Call provider office for new concerns or questions  call the Suicide and Crisis Lifeline: 988 call the [place] National Suicide Prevention Lifeline: 256-169-6815 or TTY: 706-854-1669 TTY 938-841-9112) to talk to a trained counselor call 1-800-273-TALK (toll free, 24 hour hotline) if experiencing a Mental Health or [place] Crisis   Problem List Patient Active Problem List   Diagnosis Date Noted   Heart failure with reduced ejection fraction ([place]) 10/25/2022   LBBB (left bundle branch block) 05/24/2022   Frontotemporal dementia ([place]) 03/27/2022   Vitamin D deficiency 09/26/2021   History of back surgery 03/07/2021   Knee joint replacement status, left 02/09/2020   Advanced care planning/counseling discussion 02/02/2019   Arthritis of hand 11/25/2018   Personal history of colonic polyps    Allergic rhinitis    History of prostate cancer    Hyperlipidemia    Hypertension    Personal history of kidney stones     Medication Management  Current Outpatient Medications:    Acetaminophen (ACETAMINOPHEN EXTRA STRENGTH) 500 MG capsule, Take 1 capsule by mouth every 6 (six) hours as needed for fever., Disp: , Rfl:    ASPIRIN 81 PO, Take 81 mg by mouth daily., Disp: , Rfl:    carvedilol (COREG) 3.125 MG tablet, Take 1 tablet (3.125 mg total) by mouth 2 (two) times daily., Disp: 180 tablet, Rfl: 3   Cholecalciferol (VITAMIN D3) 50 MCG (2000 UT) TABS, Take 2,000 Units by mouth daily., Disp: , Rfl:    Coenzyme Q10 100 MG TABS, Take 100 mg by mouth daily., Disp: , Rfl:    docusate sodium (COLACE) 100 MG capsule, Take 100 mg by mouth daily., Disp: , Rfl:    donepezil (ARICEPT) 5 MG tablet, Take 1 tablet (5 mg total) by mouth at bedtime., Disp: 90 tablet, Rfl: 4   fexofenadine (ALLEGRA ALLERGY) 180 MG tablet, Take 1 tablet (180 mg total) by mouth daily., Disp: 10 tablet, Rfl: 1   fluticasone (FLONASE) 50 MCG/ACT nasal spray, PLACE 2 SPRAYS INTO EACH NOSTRIL DAILY.,  Disp: 48 mL, Rfl: 3   gabapentin (NEURONTIN) 300 MG capsule, TAKE 1 CAPSULE BY MOUTH AT BEDTIME., Disp: 90 capsule, Rfl: 1   Multiple Vitamin (MULTIVITAMIN WITH MINERALS) TABS tablet, Take 1 tablet by mouth daily. Men's Multivitamin 50+, Disp: , Rfl:    rosuvastatin (CRESTOR) 40 MG tablet, Take 1 tablet (40 mg total) by mouth daily. Stop taking 20 MG tablets., Disp: 90 tablet, Rfl: 4   sacubitril-valsartan (ENTRESTO) 49-51 MG, Take 1 tablet by mouth 2 (two) times daily., Disp: 60 tablet, Rfl: 6   vitamin B-12 (CYANOCOBALAMIN) 500 MCG tablet, Take 500 mcg by mouth daily., Disp: , Rfl:   Cognitive Assessment Identity Confirmed: : Name; DOB Cognitive Status: Normal Other:  : The patient and his wife were both on the phone during the call   Functional Assessment Hearing Difficulty or Deaf: no Wear Glasses or Blind: yes Vision Management: wears glasses Concentrating, Remembering or Making Decisions Difficulty (CP): yes Concentration Management: has dementia Difficulty Communicating: no Difficulty Eating/Swallowing: no Walking or Climbing Stairs Difficulty: no Dressing/Bathing Difficulty: no Doing Errands Independently Difficulty (such as shopping) (CP):  no Change in Functional Status Since Onset of Current Illness/Injury: no   Caregiver Assessment  Primary Source of Support/Comfort: spouse Name of Support/Comfort Primary Source: Taiki Buckwalter- wife People in Home: spouse Name(s) of People in Home: Katharine Look- wife Family Caregiver if Needed: spouse Family Caregiver Names: Katharine Look- wife Primary Roles/Responsibilities: retired   Planned Interventions  Basic overview and discussion of pathophysiology of Heart Failure reviewed Provided education on low sodium diet. Review of monitoring for hidden sodium in foods Reviewed Heart Failure Action Plan in depth and provided written copy Assessed need for readable accurate scales in home. Has a scale Provided education about placing scale on  hard, flat surface Advised patient to weigh each morning after emptying bladder Discussed importance of daily weight and advised patient to weigh and record daily. The patients wife states they were weighing daily but are no longer doing this as his weight was staying the same. Education on the benefits of daily weights in  effective management of heart failure. Review of weight gain of +2/3 in one day or +5 in one week and the need to notify the provider.  The patients wife states the patient has not had swelling in feet or legs. Review of the Heart Failure Society of [place]: FACES F-fatique A- Activity intolerance C-Chest congestion/Cough E-Edema or swelling (feet, legs, ankles, abdomen) S-Shortness of breath Reviewed role of diuretics in prevention of fluid overload and management of heart failure Discussed the importance of keeping all appointments with provider Provided patient with education about the role of exercise in the management of heart failure. Also gave information to the patients wife about the Heart Failure Society of [place] website for helpful information and heart healthy diet information  Advised patient to discuss change in heart health and heart failure  with provider Screening for signs and symptoms of depression related to chronic disease state  Assessed social determinant of health barriers EF %= 30 to 35% on 06-2022. Review of EF % and what this means Evaluation of current treatment plan related to dementia  and patient's adherence to plan as established by provider Advised patient to call the office for acute changes in memory, anxiety, depression, or mood Provided education to patient re: talking to the provider about other medications effective for management of dementia. The patients wife is concerned that the Aricept may be causing headaches for the patient Reviewed medications with patient and discussed compliance and Aricept with possible side effects. Pharmacy  consult for medication management and questions Provided patient with resources available as dementia progression takes place educational materials related to effective management of dementia, patient is stable at this time Reviewed scheduled/upcoming provider appointments including 03-30-2023 at 840 am with pcp. The patients wife has a call into the neurologist for information related to Aricept and possible side effects Pharmacy referral for medication reconciliation, management, and cost constraints Discussed plans with patient for ongoing care management follow up and provided patient with direct contact information for care management team Advised patient to discuss changes in mood, or mentation, acute onset of memory decline, fall and safety concerns with provider Screening for signs and symptoms of depression related to chronic disease state  Assessed social determinant of health barriers Evaluation of current treatment plan related to hypertension self management and patient's adherence to plan as established by provider. Saw cardiologist yesterday. The patient has elevation in blood pressures at times especially when going to the providers office;   Provided education to patient re: stroke prevention, s/s of heart  attack and stroke; Reviewed prescribed diet heart healthy diet. Review and education done Reviewed medications with patient and discussed importance of compliance. Is compliant with medications;  Discussed plans with patient for ongoing care management follow up and provided patient with direct contact information for care management team; Advised patient, providing education and rationale, to monitor blood pressure daily and record, calling PCP for findings outside established parameters. Per the patients wife they have not been taking his blood pressure on a regular basis. Review of the benefits of taking blood pressures on a regular basis and especially when he may feel different or  have a headache;  Reviewed scheduled/upcoming provider appointments including: 03-30-2023 at [place] am, saw pcp in December, saw cardiologist on 11-26-2022 Advised patient to discuss changes in blood pressures, sporadic headaches, and changes in heart health with provider; Provided education on prescribed diet heart healthy diet;  Discussed complications of poorly controlled blood pressure such as heart disease, stroke, circulatory complications, vision complications, kidney impairment, sexual dysfunction;  Screening for signs and symptoms of depression related to chronic disease state;  Assessed social determinant of health barriers;     Interaction and coordination with outside resources, practitioners, and providers See CCM Referral  Care Plan: Available in MyChart

## 2022-11-27 NOTE — Patient Instructions (Addendum)
Please call the care guide team at [phone] if you need to cancel or reschedule your appointment.   If you are experiencing a Mental Health or [place] or need someone to talk to, please call the Suicide and Crisis Lifeline: 988 call the [place] National Suicide Prevention Lifeline: (610)172-7473 or TTY: 269-179-2299 TTY 301-106-4062) to talk to a trained counselor call 1-800-273-TALK (toll free, 24 hour hotline)   Following is a copy of the CCM Program Consent:  CCM service includes personalized support from designated clinical staff supervised by the physician, including individualized plan of care and coordination with other care providers 24/7 contact phone numbers for assistance for urgent and routine care needs. Service will only be billed when office clinical staff spend 20 minutes or more in a month to coordinate care. Only one practitioner may furnish and bill the service in a calendar month. The patient may stop CCM services at amy time (effective at the end of the month) by phone call to the office staff. The patient will be responsible for cost sharing (co-pay) or up to 20% of the service fee (after annual deductible is met)  Following is a copy of your full provider care plan:   Goals Addressed             This Visit's Progress    CCM Expected Outcome:  Monitor, Self-Manage and Reduce Symptoms of Heart Failure       Current Barriers:  Knowledge Deficits related to the importance of daily weights in the patient with heart failure Care Coordination needs related to cost of Entresto and help with cost constraints, pharmacy consult pending in a patient with heart failure Chronic Disease Management support and education needs related to effective management of heart failure  Planned Interventions: Basic overview and discussion of pathophysiology of Heart Failure reviewed Provided education on low sodium diet. Review of monitoring for hidden sodium in  foods Reviewed Heart Failure Action Plan in depth and provided written copy Assessed need for readable accurate scales in home. Has a scale Provided education about placing scale on hard, flat surface Advised patient to weigh each morning after emptying bladder Discussed importance of daily weight and advised patient to weigh and record daily. The patients wife states they were weighing daily but are no longer doing this as his weight was staying the same. Education on the benefits of daily weights in  effective management of heart failure. Review of weight gain of +2/3 in one day or +5 in one week and the need to notify the provider.  The patients wife states the patient has not had swelling in feet or legs. Review of the Heart Failure Society of America: FACES F-fatique A- Activity intolerance C-Chest congestion/Cough E-Edema or swelling (feet, legs, ankles, abdomen) S-Shortness of breath Reviewed role of diuretics in prevention of fluid overload and management of heart failure Discussed the importance of keeping all appointments with provider Provided patient with education about the role of exercise in the management of heart failure. Also gave information to the patients wife about the Heart Failure Society of [place] website for helpful information and heart healthy diet information  Advised patient to discuss change in heart health and heart failure  with provider Screening for signs and symptoms of depression related to chronic disease state  Assessed social determinant of health barriers EF %= 30 to 35% on 06-2022. Review of EF % and what this means  Symptom Management: Take medications as prescribed   Attend all scheduled  provider appointments Call provider office for new concerns or questions  call the Suicide and Crisis Lifeline: 988 call the [place] National Suicide Prevention Lifeline: 508 210 7644 or TTY: 404-811-3408 TTY (726) 586-6703) to talk to a trained counselor call  1-800-273-TALK (toll free, 24 hour hotline) if experiencing a Mental Health or [place]  call office if I gain more than 2 pounds in one day or 5 pounds in one week use salt in moderation watch for swelling in feet, ankles and legs every day weigh myself daily develop a rescue plan follow rescue plan if symptoms flare-up eat more whole grains, fruits and vegetables, lean meats and healthy fats track symptoms and what helps feel better or worse dress right for the weather, hot or cold  Follow Up Plan: Telephone follow up appointment with care management team member scheduled for: 01-15-2023 at 1 pm       CCM Expected Outcome:  Monitor, Self-Manage and Reduce Symptoms of: Dementia       Current Barriers:  Knowledge Deficits related to progression of dementia and how to effectively manage changes in condition Care Coordination needs related to medication needs and questions about possible side effects from Aricept  in a patient with dementia Chronic Disease Management support and education needs related to effective management of dementia  Planned Interventions: Evaluation of current treatment plan related to dementia  and patient's adherence to plan as established by provider Advised patient to call the office for acute changes in memory, anxiety, depression, or mood Provided education to patient re: talking to the provider about other medications effective for management of dementia. The patients wife is concerned that the Aricept may be causing headaches for the patient Reviewed medications with patient and discussed compliance and Aricept with possible side effects. Pharmacy consult for medication management and questions Provided patient with resources available as dementia progression takes place educational materials related to effective management of dementia, patient is stable at this time Reviewed scheduled/upcoming provider appointments including 03-30-2023 at 840 am  with pcp. The patients wife has a call into the neurologist for information related to Aricept and possible side effects Pharmacy referral for medication reconciliation, management, and cost constraints Discussed plans with patient for ongoing care management follow up and provided patient with direct contact information for care management team Advised patient to discuss changes in mood, or mentation, acute onset of memory decline, fall and safety concerns with provider Screening for signs and symptoms of depression related to chronic disease state  Assessed social determinant of health barriers  Symptom Management: Take medications as prescribed   Attend all scheduled provider appointments Call provider office for new concerns or questions  call the Suicide and Crisis Lifeline: 988 call the [place] National Suicide Prevention Lifeline: 548-870-9024 or TTY: 539-372-9030 TTY (949) 492-3684) to talk to a trained counselor call 1-800-273-TALK (toll free, 24 hour hotline) if experiencing a Mental Health or [place]   Follow Up Plan: Telephone follow up appointment with care management team member scheduled for: 01-15-2023 at 1 pm       CCM Expected Outcome:  Monitor, Self-Manage, and Reduce Symptoms of Hypertension       Current Barriers:  Knowledge Deficits related to the importance of taking blood pressures when the patient is having changes in sx and sx of elevated blood pressures  Chronic Disease Management support and education needs related to effective management of HTN BP Readings from Last 3 Encounters:  11/25/22 (!) 148/72  10/29/22 (!) 142/78  10/28/22 136/78  Planned Interventions: Evaluation of current treatment plan related to hypertension self management and patient's adherence to plan as established by provider. Saw cardiologist yesterday. The patient has elevation in blood pressures at times especially when going to the providers office;   Provided  education to patient re: stroke prevention, s/s of heart attack and stroke; Reviewed prescribed diet heart healthy diet. Review and education done Reviewed medications with patient and discussed importance of compliance. Is compliant with medications;  Discussed plans with patient for ongoing care management follow up and provided patient with direct contact information for care management team; Advised patient, providing education and rationale, to monitor blood pressure daily and record, calling PCP for findings outside established parameters. Per the patients wife they have not been taking his blood pressure on a regular basis. Review of the benefits of taking blood pressures on a regular basis and especially when he may feel different or have a headache;  Reviewed scheduled/upcoming provider appointments including: 03-30-2023 at [place] am, saw pcp in December, saw cardiologist on 11-26-2022 Advised patient to discuss changes in blood pressures, sporadic headaches, and changes in heart health with provider; Provided education on prescribed diet heart healthy diet;  Discussed complications of poorly controlled blood pressure such as heart disease, stroke, circulatory complications, vision complications, kidney impairment, sexual dysfunction;  Screening for signs and symptoms of depression related to chronic disease state;  Assessed social determinant of health barriers;   Symptom Management: Take medications as prescribed   Attend all scheduled provider appointments Call provider office for new concerns or questions  call the Suicide and Crisis Lifeline: 988 call the [place] National Suicide Prevention Lifeline: (865) 860-4735 or TTY: 7195329366 TTY 304-763-0926) to talk to a trained counselor call 1-800-273-TALK (toll free, 24 hour hotline) if experiencing a Mental Health or [place]  check blood pressure weekly learn about high blood pressure call doctor for signs and symptoms of  high blood pressure develop an action plan for high blood pressure keep all doctor appointments take medications for blood pressure exactly as prescribed report new symptoms to your doctor  Follow Up Plan: Telephone follow up appointment with care management team member scheduled for: 01-15-2023 at 1 pm          Patient verbalizes understanding of instructions and care plan provided today and agrees to view in [facility]. Active MyChart status and patient understanding of how to access instructions and care plan via MyChart confirmed with patient.     Telephone follow up appointment with care management team member scheduled for: 01-15-2023 at 1 pm  Heart Failure Eating Plan Heart failure, also called congestive heart failure, occurs when your heart does not pump blood well enough to meet your body's needs for oxygen-rich blood. Heart failure is a long-term (chronic) condition. Living with heart failure can be challenging. Following your health care provider's instructions about a healthy lifestyle and working with a dietitian to choose the right foods may help to improve your symptoms. An eating plan for someone with heart failure will include changes that limit the intake of salt (sodium) and unhealthy fat. What are tips for following this plan? Reading food labels Check food labels for the amount of sodium per serving. Choose foods that have less than 140 mg (milligrams) of sodium in each serving. Check food labels for the number of calories per serving. This is important if you need to limit your daily calorie intake to lose weight. Check food labels for the serving size. If you eat more  than one serving, you will be eating more sodium and calories than what is listed on the label. Look for foods that are labeled as "sodium-free," "very low sodium," or "low sodium." Foods labeled as "reduced sodium" or "lightly salted" may still have more sodium than what is recommended for  you. Cooking Avoid adding salt when cooking. Ask your health care provider or dietitian before using salt substitutes. Season food with salt-free seasonings, spices, or herbs. Check the label of seasoning mixes to make sure they do not contain salt. Cook with heart-healthy oils, such as olive, canola, soybean, or sunflower oil. Do not fry foods. Cook foods using low-fat methods, such as baking, boiling, grilling, and broiling. Limit unhealthy fats when cooking by: Removing the skin from poultry, such as chicken. Removing all visible fats from meats. Skimming the fat off from stews, soups, and gravies before serving them. Meal planning  Limit your intake of: Processed, canned, or prepackaged foods. Foods that are high in trans fat, such as fried foods. Sweets, desserts, sugary drinks, and other foods with added sugar. Full-fat dairy products, such as whole milk. Eat a balanced diet. This may include: 4-5 servings of fruit each day and 4-5 servings of vegetables each day. At each meal, try to fill one-half of your plate with fruits and vegetables. Up to 6-8 servings of whole grains each day. Up to 2 servings of lean meat, poultry, or fish each day. One serving of meat is equal to 3 oz (85 g). This is about the same size as a deck of cards. 2 servings of low-fat dairy each day. Heart-healthy fats. Healthy fats called omega-3 fatty acids are found in foods such as flaxseed and cold-water fish like sardines, salmon, and mackerel. Aim to eat 25-35 g (grams) of fiber a day. Foods that are high in fiber include apples, broccoli, carrots, beans, peas, and whole grains. Do not add salt or condiments that contain salt (such as soy sauce) to foods before eating. When eating at a restaurant, ask that your food be prepared with less salt or no salt, if possible. Try to eat 2 or more vegetarian meals each week. Eat more home-cooked food and eat less restaurant, buffet, and fast food. General  information Do not eat more than 2,300 mg of sodium a day. The amount of sodium that is recommended for you may be lower, depending on your condition. Maintain a healthy body weight as directed. Ask your health care provider what a healthy weight is for you. Check your weight every day. Work with your health care provider and dietitian to make a plan that is right for you to lose weight or maintain your current weight. Limit how much fluid you drink. Ask your health care provider or dietitian how much fluid you can have each day. Limit or avoid alcohol as told by your health care provider or dietitian. Recommended foods Fruits All fresh, frozen, and canned fruits. Dried fruits, such as raisins, prunes, and cranberries. Vegetables All fresh vegetables. Vegetables that are frozen without sauce or added salt. Low-sodium or sodium-free canned vegetables. Grains Bread with less than 80 mg of sodium per slice. Whole-wheat pasta, quinoa, and brown rice. Oats and oatmeal. Barley. [place]. Grits and cream of wheat. Whole-grain and whole-wheat cold cereal. Meats and other protein foods Lean cuts of meat. Skinless chicken and [place]. Fish with high omega-3 fatty acids, such as salmon, sardines, and other cold-water fishes. Eggs. Dried beans, peas, and edamame. Unsalted nuts and nut butters. Dairy  Low-fat or nonfat (skim) milk and dried milk. Rice milk, soy milk, and almond milk. Low-fat or nonfat yogurt. Small amounts of reduced-sodium block cheese. Low-sodium cottage cheese. Fats and oils Olive, canola, soybean, flaxseed, avocado, or sunflower oil. Sweets and desserts Applesauce. Granola bars. Sugar-free pudding and gelatin. Frozen fruit bars. Seasoning and other foods Fresh and dried herbs. Lemon or lime juice. Vinegar. Low-sodium ketchup. Salt-free marinades, salad dressings, sauces, and seasonings. The items listed above may not be a complete list of foods and beverages you can eat. Contact a  dietitian for more information. Foods to avoid Fruits Fruits that are dried with sodium-containing preservatives. Vegetables Canned vegetables. Frozen vegetables with sauce or seasonings. Creamed vegetables. [place] fries. Onion rings. Pickled vegetables and sauerkraut. Grains Bread with more than 80 mg of sodium per slice. Hot or cold cereal with more than 140 mg sodium per serving. Salted pretzels and crackers. Prepackaged breadcrumbs. Bagels, croissants, and biscuits. Meats and other protein foods Ribs and chicken wings. Bacon, ham, pepperoni, bologna, salami, and packaged luncheon meats. Hot dogs, bratwurst, and sausage. Canned meat. Smoked meat and fish. Salted nuts and seeds. Dairy Whole milk, half-and-half, and cream. Buttermilk. Processed cheese, cheese spreads, and cheese curds. Regular cottage cheese. Feta cheese. Shredded cheese. String cheese. Fats and oils Butter, lard, shortening, ghee, and bacon fat. Canned and packaged gravies. Seasoning and other foods Onion salt, garlic salt, table salt, and sea salt. Marinades. Regular salad dressings. Relishes, pickles, and olives. Meat flavorings and tenderizers, and bouillon cubes. Horseradish, ketchup, and mustard. Worcestershire sauce. Teriyaki sauce, soy sauce (including reduced sodium). Hot sauce and Tabasco sauce. Steak sauce, fish sauce, oyster sauce, and cocktail sauce. Taco seasonings. Barbecue sauce. Tartar sauce. The items listed above may not be a complete list of foods and beverages you should avoid. Contact a dietitian for more information. Summary A heart failure eating plan includes changes that limit your intake of sodium and unhealthy fat, and it may help you lose weight or maintain a healthy weight. Your health care provider may also recommend limiting how much fluid you drink. Most people with heart failure should eat no more than 2,300 mg of salt (sodium) a day. The amount of sodium that is recommended for you may be lower,  depending on your condition. Contact your health care provider or dietitian before making any major changes to your diet. This information is not intended to replace advice given to you by your health care provider. Make sure you discuss any questions you have with your health care provider. Document Revised: 02/17/2022 Document Reviewed: 06/24/2020 Elsevier Patient Education  [place]. Heart Failure Action Plan A heart failure action plan helps you understand what to do when you have symptoms of heart failure. Your action plan is a color-coded plan that lists the symptoms to watch for and indicates what actions to take. If you have symptoms in the red zone, you need medical care right away. If you have symptoms in the yellow zone, you are having problems. If you have symptoms in the green zone, you are doing well. Follow the plan that was created by you and your health care provider. Review your plan each time you visit your health care provider. Red zone These signs and symptoms mean you should get medical help right away: You have trouble breathing when resting. You have a dry cough that is getting worse. You have swelling or pain in your legs or abdomen that is getting worse. You suddenly gain more than  2-3 lb (0.9-1.4 kg) in 24 hours, or more than 5 lb (2.3 kg) in a week. This amount may be more or less depending on your condition. You have trouble staying awake or you feel confused. You have chest pain. You do not have an appetite. You pass out. You have worsening sadness or depression. If you have any of these symptoms, call your local emergency services (911 in the [place] right away. Do not drive yourself to the hospital. Yellow zone These signs and symptoms mean your condition may be getting worse and you should make some changes: You have trouble breathing when you are active, or you need to sleep with your head raised on extra pillows to help you breathe. You have  swelling in your legs or abdomen. You gain 2-3 lb (0.9-1.4 kg) in 24 hours, or 5 lb (2.3 kg) in a week. This amount may be more or less depending on your condition. You get tired easily. You have trouble sleeping. You have a dry cough. If you have any of these symptoms: Contact your health care provider within the next day. Your health care provider may adjust your medicines. Green zone These signs mean you are doing well and can continue what you are doing: You do not have shortness of breath. You have very little swelling or no new swelling. Your weight is stable (no gain or loss). You have a normal activity level. You do not have chest pain or any other new symptoms. Follow these instructions at home: Take over-the-counter and prescription medicines only as told by your health care provider. Weigh yourself daily. Your target weight is __________ lb (__________ kg). Call your health care provider if you gain more than __________ lb (__________ kg) in 24 hours, or more than __________ lb (__________ kg) in a week. Health care provider name: _____________________________________________________ Health care provider phone number: _____________________________________________________ Eat a heart-healthy diet. Work with a diet and nutrition specialist (dietitian) to create an eating plan that is best for you. Keep all follow-up visits. This is important. Where to find more information American Heart Association: Summary A heart failure action plan helps you understand what to do when you have symptoms of heart failure. Follow the action plan that was created by you and your health care provider. Get help right away if you have any symptoms in the red zone. This information is not intended to replace advice given to you by your health care provider. Make sure you discuss any questions you have with your health care provider. Document Revised: 02/17/2022 Document Reviewed:  06/24/2020 Elsevier Patient Education  [place].

## 2022-11-27 NOTE — Chronic Care Management (AMB) (Signed)
Chronic Care Management   CCM RN Visit Note  11/27/2022 Name: DEMARRIUS Huang MRN: [id number] DOB: 01/14/1951  Subjective: Christopher Huang is a 72 y.o. year old male who is a primary care patient of Cannady, Christopher Huang. The patient was referred to the Chronic Care Management team for assistance with care management needs subsequent to provider initiation of CCM services and plan of care.    Today's Visit:  Engaged with patient by telephone for initial visit.     SDOH Interventions Today    Flowsheet Row Most Recent Value  SDOH Interventions   Food Insecurity Interventions Intervention Not Indicated  Housing Interventions Intervention Not Indicated  Transportation Interventions Intervention Not Indicated  Utilities Interventions Intervention Not Indicated  Alcohol Usage Interventions Intervention Not Indicated (Score <7)  Financial Strain Interventions Intervention Not Indicated, Other (Comment)  [pharmacy referral for assistance with medication cost]  Physical Activity Interventions Intervention Not Indicated, Other (Comments)  [he and his wife go to the gym 2 days a week and the patient also walks daily if possible]  Stress Interventions Intervention Not Indicated  Social Connections Interventions Intervention Not Indicated         Goals Addressed             This Visit's Progress    CCM Expected Outcome:  Monitor, Self-Manage and Reduce Symptoms of Heart Failure       Current Barriers:  Knowledge Deficits related to the importance of daily weights in the patient with heart failure Care Coordination needs related to cost of Entresto and help with cost constraints, pharmacy consult pending in a patient with heart failure Chronic Disease Management support and education needs related to effective management of heart failure  Planned Interventions: Basic overview and discussion of pathophysiology of Heart Failure reviewed Provided education on low sodium diet. Review of  monitoring for hidden sodium in foods Reviewed Heart Failure Action Plan in depth and provided written copy Assessed need for readable accurate scales in home. Has a scale Provided education about placing scale on hard, flat surface Advised patient to weigh each morning after emptying bladder Discussed importance of daily weight and advised patient to weigh and record daily. The patients wife states they were weighing daily but are no longer doing this as his weight was staying the same. Education on the benefits of daily weights in  effective management of heart failure. Review of weight gain of +2/3 in one day or +5 in one week and the need to notify the provider.  The patients wife states the patient has not had swelling in feet or legs. Review of the Heart Failure Society of [place]: FACES F-fatique A- Activity intolerance C-Chest congestion/Cough E-Edema or swelling (feet, legs, ankles, abdomen) S-Shortness of breath Reviewed role of diuretics in prevention of fluid overload and management of heart failure Discussed the importance of keeping all appointments with provider Provided patient with education about the role of exercise in the management of heart failure. Also gave information to the patients wife about the Heart Failure Society of [place] website for helpful information and heart healthy diet information  Advised patient to discuss change in heart health and heart failure  with provider Screening for signs and symptoms of depression related to chronic disease state  Assessed social determinant of health barriers EF %= 30 to 35% on 06-2022. Review of EF % and what this means  Symptom Management: Take medications as prescribed   Attend all scheduled provider appointments Call provider office  for new concerns or questions  call the Suicide and Crisis Lifeline: 988 call the [place] National Suicide Prevention Lifeline: (865) 243-5453 or TTY: 2138459513 TTY 2562036729) to talk to  a trained counselor call 1-800-273-TALK (toll free, 24 hour hotline) if experiencing a Mental Health or [place]  call office if I gain more than 2 pounds in one day or 5 pounds in one week use salt in moderation watch for swelling in feet, ankles and legs every day weigh myself daily develop a rescue plan follow rescue plan if symptoms flare-up eat more whole grains, fruits and vegetables, lean meats and healthy fats track symptoms and what helps feel better or worse dress right for the weather, hot or cold  Follow Up Plan: Telephone follow up appointment with care management team member scheduled for: 01-15-2023 at 1 pm       CCM Expected Outcome:  Monitor, Self-Manage and Reduce Symptoms of: Dementia       Current Barriers:  Knowledge Deficits related to progression of dementia and how to effectively manage changes in condition Care Coordination needs related to medication needs and questions about possible side effects from Aricept  in a patient with dementia Chronic Disease Management support and education needs related to effective management of dementia  Planned Interventions: Evaluation of current treatment plan related to dementia  and patient's adherence to plan as established by provider Advised patient to call the office for acute changes in memory, anxiety, depression, or mood Provided education to patient re: talking to the provider about other medications effective for management of dementia. The patients wife is concerned that the Aricept may be causing headaches for the patient Reviewed medications with patient and discussed compliance and Aricept with possible side effects. Pharmacy consult for medication management and questions Provided patient with resources available as dementia progression takes place educational materials related to effective management of dementia, patient is stable at this time Reviewed scheduled/upcoming provider appointments  including 03-30-2023 at 840 am with pcp. The patients wife has a call into the neurologist for information related to Aricept and possible side effects Pharmacy referral for medication reconciliation, management, and cost constraints Discussed plans with patient for ongoing care management follow up and provided patient with direct contact information for care management team Advised patient to discuss changes in mood, or mentation, acute onset of memory decline, fall and safety concerns with provider Screening for signs and symptoms of depression related to chronic disease state  Assessed social determinant of health barriers  Symptom Management: Take medications as prescribed   Attend all scheduled provider appointments Call provider office for new concerns or questions  call the Suicide and Crisis Lifeline: 988 call the [place] National Suicide Prevention Lifeline: 3802967842 or TTY: 774-680-0556 TTY 3122166340) to talk to a trained counselor call 1-800-273-TALK (toll free, 24 hour hotline) if experiencing a Mental Health or [place]   Follow Up Plan: Telephone follow up appointment with care management team member scheduled for: 01-15-2023 at 1 pm       CCM Expected Outcome:  Monitor, Self-Manage, and Reduce Symptoms of Hypertension       Current Barriers:  Knowledge Deficits related to the importance of taking blood pressures when the patient is having changes in sx and sx of elevated blood pressures  Chronic Disease Management support and education needs related to effective management of HTN BP Readings from Last 3 Encounters:  11/25/22 (!) 148/72  10/29/22 (!) 142/78  10/28/22 136/78     Planned  Interventions: Evaluation of current treatment plan related to hypertension self management and patient's adherence to plan as established by provider. Saw cardiologist yesterday. The patient has elevation in blood pressures at times especially when going to the providers  office;   Provided education to patient re: stroke prevention, s/s of heart attack and stroke; Reviewed prescribed diet heart healthy diet. Review and education done Reviewed medications with patient and discussed importance of compliance. Is compliant with medications;  Discussed plans with patient for ongoing care management follow up and provided patient with direct contact information for care management team; Advised patient, providing education and rationale, to monitor blood pressure daily and record, calling PCP for findings outside established parameters. Per the patients wife they have not been taking his blood pressure on a regular basis. Review of the benefits of taking blood pressures on a regular basis and especially when he may feel different or have a headache;  Reviewed scheduled/upcoming provider appointments including: 03-30-2023 at [facility] am, saw pcp in December, saw cardiologist on 11-26-2022 Advised patient to discuss changes in blood pressures, sporadic headaches, and changes in heart health with provider; Provided education on prescribed diet heart healthy diet;  Discussed complications of poorly controlled blood pressure such as heart disease, stroke, circulatory complications, vision complications, kidney impairment, sexual dysfunction;  Screening for signs and symptoms of depression related to chronic disease state;  Assessed social determinant of health barriers;   Symptom Management: Take medications as prescribed   Attend all scheduled provider appointments Call provider office for new concerns or questions  call the Suicide and Crisis Lifeline: 988 call the [place] National Suicide Prevention Lifeline: (620)069-7988 or TTY: 934-620-8921 TTY 318-641-6752) to talk to a trained counselor call 1-800-273-TALK (toll free, 24 hour hotline) if experiencing a Mental Health or [place]  check blood pressure weekly learn about high blood pressure call Christopher for  signs and symptoms of high blood pressure develop an action plan for high blood pressure keep all Christopher appointments take medications for blood pressure exactly as prescribed report new symptoms to your Christopher  Follow Up Plan: Telephone follow up appointment with care management team member scheduled for: 01-15-2023 at 1 pm          Plan:Telephone follow up appointment with care management team member scheduled for:  01-15-2023 at 1 pm  Noreene Larsson RN, MSN, CCM RN Care Manager  Chronic Care Management Direct Number: [phone]

## 2022-12-01 ENCOUNTER — Encounter
Admission: RE | Disposition: A | Discharge status: Home / Self Care | Payer: Self-pay | Source: Home / Self Care | Attending: Gastroenterology

## 2022-12-01 ENCOUNTER — Ambulatory Visit: Payer: PPO | Admitting: Anesthesiology

## 2022-12-01 ENCOUNTER — Ambulatory Visit
Admission: RE | Admit: 2022-12-01 | Discharge: 2022-12-01 | Disposition: A | Discharge status: Home / Self Care | Payer: PPO | Attending: Gastroenterology | Admitting: Gastroenterology

## 2022-12-01 ENCOUNTER — Encounter: Payer: Self-pay | Admitting: Gastroenterology

## 2022-12-01 DIAGNOSIS — Z8546 Personal history of malignant neoplasm of prostate: Secondary | ICD-10-CM | POA: Diagnosis not present

## 2022-12-01 DIAGNOSIS — K635 Polyp of colon: Secondary | ICD-10-CM

## 2022-12-01 DIAGNOSIS — D125 Benign neoplasm of sigmoid colon: Secondary | ICD-10-CM | POA: Insufficient documentation

## 2022-12-01 DIAGNOSIS — D123 Benign neoplasm of transverse colon: Secondary | ICD-10-CM | POA: Insufficient documentation

## 2022-12-01 DIAGNOSIS — T7840XA Allergy, unspecified, initial encounter: Secondary | ICD-10-CM | POA: Diagnosis not present

## 2022-12-01 DIAGNOSIS — F0390 Unspecified dementia without behavioral disturbance: Secondary | ICD-10-CM | POA: Insufficient documentation

## 2022-12-01 DIAGNOSIS — D126 Benign neoplasm of colon, unspecified: Secondary | ICD-10-CM | POA: Diagnosis not present

## 2022-12-01 DIAGNOSIS — Z87442 Personal history of urinary calculi: Secondary | ICD-10-CM | POA: Diagnosis not present

## 2022-12-01 DIAGNOSIS — Z1211 Encounter for screening for malignant neoplasm of colon: Secondary | ICD-10-CM | POA: Diagnosis not present

## 2022-12-01 DIAGNOSIS — D122 Benign neoplasm of ascending colon: Secondary | ICD-10-CM | POA: Diagnosis not present

## 2022-12-01 DIAGNOSIS — Z86010 Personal history of colonic polyps: Secondary | ICD-10-CM | POA: Insufficient documentation

## 2022-12-01 DIAGNOSIS — K641 Second degree hemorrhoids: Secondary | ICD-10-CM | POA: Diagnosis not present

## 2022-12-01 DIAGNOSIS — Z9889 Other specified postprocedural states: Secondary | ICD-10-CM | POA: Insufficient documentation

## 2022-12-01 DIAGNOSIS — K5730 Diverticulosis of large intestine without perforation or abscess without bleeding: Secondary | ICD-10-CM | POA: Insufficient documentation

## 2022-12-01 DIAGNOSIS — I10 Essential (primary) hypertension: Secondary | ICD-10-CM | POA: Insufficient documentation

## 2022-12-01 DIAGNOSIS — E785 Hyperlipidemia, unspecified: Secondary | ICD-10-CM | POA: Insufficient documentation

## 2022-12-01 HISTORY — PX: COLONOSCOPY WITH PROPOFOL: SHX5780

## 2022-12-01 HISTORY — DX: Unspecified dementia, unspecified severity, without behavioral disturbance, psychotic disturbance, mood disturbance, and anxiety: F03.90

## 2022-12-01 SURGERY — COLONOSCOPY WITH PROPOFOL
Anesthesia: General

## 2022-12-01 MED ORDER — LIDOCAINE HCL (CARDIAC) PF 100 MG/5ML IV SOSY
PREFILLED_SYRINGE | INTRAVENOUS | Status: DC | PRN
  Administered 2022-12-01: 100 mg via INTRAVENOUS

## 2022-12-01 MED ORDER — GLYCOPYRROLATE 0.2 MG/ML IJ SOLN
INTRAMUSCULAR | Status: DC | PRN
  Administered 2022-12-01: .2 mg via INTRAVENOUS

## 2022-12-01 MED ORDER — PHENYLEPHRINE HCL (PRESSORS) 10 MG/ML IV SOLN
INTRAVENOUS | Status: DC | PRN
  Administered 2022-12-01: 160 ug via INTRAVENOUS
  Administered 2022-12-01: 280 ug via INTRAVENOUS
  Administered 2022-12-01: 160 ug via INTRAVENOUS
  Administered 2022-12-01: 320 ug via INTRAVENOUS
  Administered 2022-12-01: 200 ug via INTRAVENOUS

## 2022-12-01 MED ORDER — PROPOFOL 500 MG/50ML IV EMUL
INTRAVENOUS | Status: DC | PRN
  Administered 2022-12-01: 189.357 ug/kg/min via INTRAVENOUS

## 2022-12-01 MED ORDER — PROPOFOL 10 MG/ML IV BOLUS
INTRAVENOUS | Status: DC | PRN
  Administered 2022-12-01: 90 mg via INTRAVENOUS

## 2022-12-01 MED ORDER — EPHEDRINE SULFATE (PRESSORS) 50 MG/ML IJ SOLN
INTRAMUSCULAR | Status: DC | PRN
  Administered 2022-12-01: 7.5 mg via INTRAVENOUS
  Administered 2022-12-01 (×2): 5 mg via INTRAVENOUS
  Administered 2022-12-01: 7.5 mg via INTRAVENOUS
  Administered 2022-12-01: 15 mg via INTRAVENOUS

## 2022-12-01 MED ORDER — SODIUM CHLORIDE 0.9 % IV SOLN: INTRAVENOUS | Status: DC

## 2022-12-01 NOTE — Anesthesia Postprocedure Evaluation (Signed)
Anesthesia Post Note  Patient: Christopher Huang  Procedure(s) Performed: COLONOSCOPY WITH PROPOFOL  Patient location during evaluation: Endoscopy Anesthesia Type: General Level of consciousness: awake and alert Pain management: pain level controlled Vital Signs Assessment: post-procedure vital signs reviewed and stable Respiratory status: spontaneous breathing, nonlabored ventilation, respiratory function stable and patient connected to nasal cannula oxygen Cardiovascular status: blood pressure returned to baseline and stable Postop Assessment: no apparent nausea or vomiting Anesthetic complications: no   No notable events documented.   Last Vitals:  Vitals:   12/01/22 1153 12/01/22 1213  BP: (!) 140/62 (!) 143/77  Pulse: 79 78  Resp: 13 15  Temp:    SpO2: 95% 96%    Last Pain:  Vitals:   12/01/22 1213  TempSrc:   PainSc: 0-No pain                 Precious Haws Rosela Supak

## 2022-12-01 NOTE — Anesthesia Preprocedure Evaluation (Addendum)
Anesthesia Evaluation  Patient identified by MRN, date of birth, ID band Patient awake    Reviewed: Allergy & Precautions, NPO status , Patient's Chart, lab work & pertinent test results  History of Anesthesia Complications Negative for: history of anesthetic complications  Airway Mallampati: III  TM Distance: <3 FB Neck ROM: full    Dental  (+) Chipped   Pulmonary neg pulmonary ROS, neg shortness of breath   Pulmonary exam normal        Cardiovascular Exercise Tolerance: Good hypertension, (-) angina Normal cardiovascular exam+ dysrhythmias      Neuro/Psych       Dementia negative neurological ROS     GI/Hepatic negative GI ROS, Neg liver ROS,neg GERD  ,,  Endo/Other  negative endocrine ROS    Renal/GU negative Renal ROS  negative genitourinary   Musculoskeletal   Abdominal   Peds  Hematology negative hematology ROS (+)   Anesthesia Other Findings Past Medical History: No date: Allergic rhinitis 02/2007: Cancer of prostate ([place])     Comment:  s/p surgery No date: Dementia ([place]) No date: History of kidney stones     Comment:  H/O No date: Hyperlipidemia No date: Hypertension No date: Personal history of kidney stones  Past Surgical History: 01/29/2021: ANTERIOR LATERAL LUMBAR FUSION WITH PERCUTANEOUS SCREW 1  LEVEL; N/A     Comment:  Procedure: L4-5 LATERAL INTERBODY FUSION, L4-5 POSTERIOR              FUSION;  Surgeon: Meade Maw, MD;  Location: [facility]              ORS;  Service: Neurosurgery;  Laterality: N/A; No date: basal skin cancers No date: CATARACT EXTRACTION, BILATERAL     Comment:  2021 11/09/2017: COLONOSCOPY WITH PROPOFOL; N/A     Comment:  Procedure: COLONOSCOPY WITH PROPOFOL;  Surgeon: Lucilla Lame, MD;  Location: [facility] ENDOSCOPY;  Service:               Endoscopy;  Laterality: N/A; No date: EYE SURGERY; Bilateral     Comment:  Cataract No date: HAND SURGERY      Comment:  THUMB SURGERY No date: KNEE ARTHROSCOPY No date: prostate cancer removed No date: TONSILLECTOMY No date: TONSILLECTOMY 02/09/2020: TOTAL KNEE ARTHROPLASTY; Left     Comment:  Procedure: TOTAL KNEE ARTHROPLASTY - RNFA;  Surgeon:               Hessie Knows, MD;  Location: [facility] ORS;  Service:               Orthopedics;  Laterality: Left;  BMI    Body Mass Index: 29.69 kg/m      Reproductive/Obstetrics negative OB ROS                             Anesthesia Physical Anesthesia Plan  ASA: 2  Anesthesia Plan: General   Post-op Pain Management:    Induction: Intravenous  PONV Risk Score and Plan: Propofol infusion and TIVA  Airway Management Planned: Natural Airway and Nasal Cannula  Additional Equipment:   Intra-op Plan:   Post-operative Plan:   Informed Consent: I have reviewed the patients History and Physical, chart, labs and discussed the procedure including the risks, benefits and alternatives for the proposed anesthesia with the patient or authorized representative who has indicated his/her understanding and acceptance.  Dental Advisory Given  Plan Discussed with: Anesthesiologist, CRNA and Surgeon  Anesthesia Plan Comments: (Patient and wife consented for risks of anesthesia including but not limited to:  - adverse reactions to medications - risk of airway placement if required - damage to eyes, teeth, lips or other oral mucosa - nerve damage due to positioning  - sore throat or hoarseness - Damage to heart, brain, nerves, lungs, other parts of body or loss of life  They voiced understanding.)       Anesthesia Quick Evaluation

## 2022-12-01 NOTE — Op Note (Signed)
[facility] Gastroenterology Patient Name: Christopher Huang Procedure Date: 12/01/2022 10:56 AM MRN: [id number] Account #: [id number] Date of Birth: [birth year] Admit Type: Outpatient Age: 72 Room: [facility] ENDO ROOM 4 Gender: Male Note Status: Finalized Instrument Name: Jasper Riling [id number] Procedure:             Colonoscopy Indications:           High risk colon cancer surveillance: Personal history                         of colonic polyps Providers:             Lucilla Lame MD, MD Referring MD:          Barbaraann Faster. Cannady (Referring MD) Medicines:             Propofol per Anesthesia Complications:         No immediate complications. Procedure:             Pre-Anesthesia Assessment:                        - Prior to the procedure, a History and Physical was                         performed, and patient medications and allergies were                         reviewed. The patient's tolerance of previous                         anesthesia was also reviewed. The risks and benefits                         of the procedure and the sedation options and risks                         were discussed with the patient. All questions were                         answered, and informed consent was obtained. Prior                         Anticoagulants: The patient has taken no anticoagulant                         or antiplatelet agents. ASA Grade Assessment: II - A                         patient with mild systemic disease. After reviewing                         the risks and benefits, the patient was deemed in                         satisfactory condition to undergo the procedure.                        After obtaining informed consent, the colonoscope was  passed under direct vision. Throughout the procedure,                         the patient's blood pressure, pulse, and oxygen                         saturations were monitored continuously. The                          Colonoscope was introduced through the anus and                         advanced to the the cecum, identified by appendiceal                         orifice and ileocecal valve. The colonoscopy was                         performed without difficulty. The patient tolerated                         the procedure well. The quality of the bowel                         preparation was good. Findings:      The perianal and digital rectal examinations were normal.      A 3 to 9 mm polyp was found in the sigmoid colon. The polyp was sessile.       The polyp was removed with a cold snare. Resection and retrieval were       complete.      Four sessile polyps were found in the ascending colon and cecum. The       polyps were 3 to 5 mm in size. These polyps were removed with a cold       snare. Resection and retrieval were complete.      A 4 mm polyp was found in the transverse colon. The polyp was sessile.       The polyp was removed with a cold snare. Resection and retrieval were       complete.      Multiple small-mouthed diverticula were found in the sigmoid colon.      Non-bleeding internal hemorrhoids were found during retroflexion. The       hemorrhoids were Grade II (internal hemorrhoids that prolapse but reduce       spontaneously). Impression:            - One 3 to 9 mm polyp in the sigmoid colon, removed                         with a cold snare. Resected and retrieved.                        - Four 3 to 5 mm polyps in the ascending colon and in                         the cecum, removed with a cold snare. Resected and  retrieved.                        - One 4 mm polyp in the transverse colon, removed with                         a cold snare. Resected and retrieved.                        - Diverticulosis in the sigmoid colon.                        - Non-bleeding internal hemorrhoids. Recommendation:        - Discharge patient to home.                         - Resume previous diet.                        - Continue present medications.                        - Await pathology results.                        - Repeat colonoscopy in 3 years for surveillance. Procedure Code(s):     --- Professional ---                        [id number], Colonoscopy, flexible; with removal of                         tumor(s), polyp(s), or other lesion(s) by snare                         technique Diagnosis Code(s):     --- Professional ---                        Z86.010, Personal history of colonic polyps                        D12.5, Benign neoplasm of sigmoid colon                        D12.3, Benign neoplasm of transverse colon (hepatic                         flexure or splenic flexure) CPT copyright 2022 American Medical Association. All rights reserved. The codes documented in this report are preliminary and upon coder review may  be revised to meet current compliance requirements. Lucilla Lame MD, MD 12/01/2022 11:40:12 AM This report has been signed electronically. Number of Addenda: 0 Note Initiated On: 12/01/2022 10:56 AM Scope Withdrawal Time: 0 hours 22 minutes 23 seconds  Total Procedure Duration: 0 hours 29 minutes 25 seconds  Estimated Blood Loss:  Estimated blood loss: none.      [facility]

## 2022-12-01 NOTE — Transfer of Care (Signed)
Immediate Anesthesia Transfer of Care Note  Patient: Christopher Huang  Procedure(s) Performed: COLONOSCOPY WITH PROPOFOL  Patient Location: Endoscopy Unit  Anesthesia Type:General  Level of Consciousness: drowsy  Airway & Oxygen Therapy: Patient Spontanous Breathing  Post-op Assessment: Report given to RN and Post -op Vital signs reviewed and stable  Post vital signs: Reviewed and stable  Last Vitals:  Vitals Value Taken Time  BP 84/58 12/01/22 1143  Temp    Pulse 66 12/01/22 1143  Resp 14 12/01/22 1143  SpO2 94 % 12/01/22 1143    Last Pain:  Vitals:   12/01/22 1143  TempSrc:   PainSc: Asleep         Complications: No notable events documented.

## 2022-12-01 NOTE — H&P (Signed)
Lucilla Lame, MD [facility] [address], [place], [place] [address] Phone:[phone] Fax : [phone]  Primary Care Physician:  Venita Lick, NP Primary Gastroenterologist:  Dr. Allen Norris  Pre-Procedure History & Physical: HPI:  Christopher Huang is a 72 y.o. male is here for an colonoscopy.   Past Medical History:  Diagnosis Date   Allergic rhinitis    Cancer of prostate ([place]) 02/2007   s/p surgery   Dementia ([place])    History of kidney stones    H/O   Hyperlipidemia    Hypertension    Personal history of kidney stones     Past Surgical History:  Procedure Laterality Date   ANTERIOR LATERAL LUMBAR FUSION WITH PERCUTANEOUS SCREW 1 LEVEL N/A 01/29/2021   Procedure: L4-5 LATERAL INTERBODY FUSION, L4-5 POSTERIOR FUSION;  Surgeon: Meade Maw, MD;  Location: [facility] ORS;  Service: Neurosurgery;  Laterality: N/A;   basal skin cancers     CATARACT EXTRACTION, BILATERAL     2021   COLONOSCOPY WITH PROPOFOL N/A 11/09/2017   Procedure: COLONOSCOPY WITH PROPOFOL;  Surgeon: Lucilla Lame, MD;  Location: [facility] ENDOSCOPY;  Service: Endoscopy;  Laterality: N/A;   EYE SURGERY Bilateral    Cataract   HAND SURGERY     THUMB SURGERY   KNEE ARTHROSCOPY     prostate cancer removed     TONSILLECTOMY     TONSILLECTOMY     TOTAL KNEE ARTHROPLASTY Left 02/09/2020   Procedure: TOTAL KNEE ARTHROPLASTY - RNFA;  Surgeon: Hessie Knows, MD;  Location: [facility] ORS;  Service: Orthopedics;  Laterality: Left;    Prior to Admission medications   Medication Sig Start Date End Date Taking? Authorizing Provider  Acetaminophen (ACETAMINOPHEN EXTRA STRENGTH) 500 MG capsule Take 1 capsule by mouth every 6 (six) hours as needed for fever.   Yes [provider]  carvedilol (COREG) 3.125 MG tablet Take 1 tablet (3.125 mg total) by mouth 2 (two) times daily. 08/25/22 08/20/23 Yes Gollan, Kathlene November, MD  sacubitril-valsartan (ENTRESTO) 49-51 MG Take 1 tablet by mouth 2 (two) times daily. 11/25/22  Yes  Gollan, Kathlene November, MD  ASPIRIN 81 PO Take 81 mg by mouth daily.    [provider]  Cholecalciferol (VITAMIN D3) 50 MCG (2000 UT) TABS Take 2,000 Units by mouth daily.    [provider]  Coenzyme Q10 100 MG TABS Take 100 mg by mouth daily.    [provider]  docusate sodium (COLACE) 100 MG capsule Take 100 mg by mouth daily.    [provider]  donepezil (ARICEPT) 5 MG tablet Take 1 tablet (5 mg total) by mouth at bedtime. 03/27/22   Cannady, Henrine Screws T, NP  fexofenadine (ALLEGRA ALLERGY) 180 MG tablet Take 1 tablet (180 mg total) by mouth daily. 12/05/21   Vigg, Avanti, MD  fluticasone (FLONASE) 50 MCG/ACT nasal spray PLACE 2 SPRAYS INTO EACH NOSTRIL DAILY. 02/17/22   Marnee Guarneri T, NP  gabapentin (NEURONTIN) 300 MG capsule TAKE 1 CAPSULE BY MOUTH AT BEDTIME. 10/20/22   Jon Billings, NP  Multiple Vitamin (MULTIVITAMIN WITH MINERALS) TABS tablet Take 1 tablet by mouth daily. Men's Multivitamin 50+    [provider]  rosuvastatin (CRESTOR) 40 MG tablet Take 1 tablet (40 mg total) by mouth daily. Stop taking 20 MG tablets. 03/29/22   Cannady, Henrine Screws T, NP  vitamin B-12 (CYANOCOBALAMIN) 500 MCG tablet Take 500 mcg by mouth daily.    [provider]    Allergies as of 06/26/2022 - Review  Complete 05/28/2022  Allergen Reaction Noted   Percocet [oxycodone-acetaminophen] Nausea And Vomiting 03/27/2015   Tramadol Nausea And Vomiting 03/27/2015   Vicodin [hydrocodone-acetaminophen] Nausea And Vomiting 03/27/2015    Family History  Problem Relation Age of Onset   Dementia Mother    Prostate cancer Father     Social History   Socioeconomic History   Marital status: Married    Spouse name: Not on file   Number of children: Not on file   Years of education: Not on file   Highest education level: High school graduate  Occupational History   Occupation: retired   Tobacco Use   Smoking status: Never   Smokeless tobacco: Never  Vaping  Use   Vaping Use: Never used  Substance and Sexual Activity   Alcohol use: No   Drug use: No   Sexual activity: Yes  Other Topics Concern   Not on file  Social History Narrative   Goes fishing and hunting    Meets with friends every morning for breakfast    Church    Social Determinants of Health   Financial Resource Strain: [place]  (11/27/2022)   Overall Financial Resource Strain (CARDIA)    Difficulty of Paying Living Expenses: Not hard at all  Food Insecurity: No Food Insecurity (11/27/2022)   Hunger Vital Sign    Worried About Running Out of Food in the Last Year: Never true    [place] in the Last Year: Never true  Transportation Needs: No Transportation Needs (11/27/2022)   PRAPARE - Hydrologist (Medical): No    Lack of Transportation (Non-Medical): No  Physical Activity: Sufficiently Active (11/27/2022)   Exercise Vital Sign    Days of Exercise per Week: 4 days    Minutes of Exercise per Session: 50 min  Stress: No Stress Concern Present (11/27/2022)   [place]    Feeling of Stress : Only a little  Social Connections: Moderately Integrated (11/27/2022)   Social Connection and Isolation Panel [NHANES]    Frequency of Communication with Friends and Family: More than three times a week    Frequency of Social Gatherings with Friends and Family: More than three times a week    Attends Religious Services: More than 4 times per year    Active Member of [employer] or Organizations: No    Attends Archivist Meetings: Never    Marital Status: Married  Human resources officer Violence: Not At Risk (11/27/2022)   Humiliation, Afraid, Rape, and Kick questionnaire    Fear of Current or Ex-Partner: No    Emotionally Abused: No    Physically Abused: No    Sexually Abused: No    Review of Systems: See HPI, otherwise negative ROS  Physical Exam: BP (!) 167/76   Pulse 66   Temp (!)  96.8 F (36 C) (Temporal)   Resp 18   Ht '6\' 1"'$  (1.854 m)   Wt 102.1 kg   SpO2 98%   BMI 29.69 kg/m  General:   Alert,  pleasant and cooperative in NAD Head:  Normocephalic and atraumatic. Neck:  Supple; no masses or thyromegaly. Lungs:  Clear throughout to auscultation.    Heart:  Regular rate and rhythm. Abdomen:  Soft, nontender and nondistended. Normal bowel sounds, without guarding, and without rebound.   Neurologic:  Alert and  oriented x4;  grossly normal neurologically.  Impression/Plan: Christopher Huang is here for an  colonoscopy to be performed for a history of adenomatous polyps on 2018   Risks, benefits, limitations, and alternatives regarding  colonoscopy have been reviewed with the patient.  Questions have been answered.  All parties agreeable.   Lucilla Lame, MD  12/01/2022, 10:52 AM

## 2022-12-02 ENCOUNTER — Encounter: Payer: Self-pay | Admitting: Gastroenterology

## 2022-12-02 LAB — SURGICAL PATHOLOGY

## 2022-12-03 ENCOUNTER — Telehealth: Payer: Self-pay | Admitting: Psychiatry

## 2022-12-03 NOTE — Telephone Encounter (Signed)
Contacted spouse back, went over Dr Georgina Peer recommendations. She stated she would like to do some research on Namenda before it being prescribed and then she will let us know what she thinks.

## 2022-12-03 NOTE — Telephone Encounter (Signed)
Wife states she has done some research re: pt's with heart conditions and how they should not take donepezil (ARICEPT) 5 MG tablet.  Wife would like a call to discuss pt taking another medication.

## 2022-12-03 NOTE — Telephone Encounter (Signed)
Based on cardiology's note they didn't mention any concerns with him taking donepezil other than his reported headaches with it. As long as he's not actively having symptoms from his cardiac issues I don't have an issue with him taking it. However if he prefers to stop it and try a different medication I can send a prescription for Namenda to his pharmacy to take instead

## 2022-12-03 NOTE — Telephone Encounter (Signed)
Do you have any concerns with patient taking Aricept with his cardio condition ? He just saw cardio 11/25/22

## 2022-12-07 ENCOUNTER — Encounter: Payer: Self-pay | Admitting: Student in an Organized Health Care Education/Training Program

## 2022-12-07 ENCOUNTER — Ambulatory Visit
Payer: PPO | Attending: Student in an Organized Health Care Education/Training Program | Admitting: Student in an Organized Health Care Education/Training Program

## 2022-12-07 VITALS — BP 143/67 | HR 64 | Temp 97.2°F | Resp 16 | Ht 73.0 in | Wt 225.0 lb

## 2022-12-07 DIAGNOSIS — M5136 Other intervertebral disc degeneration, lumbar region: Secondary | ICD-10-CM | POA: Diagnosis not present

## 2022-12-07 DIAGNOSIS — Z96652 Presence of left artificial knee joint: Secondary | ICD-10-CM | POA: Diagnosis not present

## 2022-12-07 DIAGNOSIS — M48061 Spinal stenosis, lumbar region without neurogenic claudication: Secondary | ICD-10-CM | POA: Diagnosis not present

## 2022-12-07 DIAGNOSIS — M47816 Spondylosis without myelopathy or radiculopathy, lumbar region: Secondary | ICD-10-CM | POA: Diagnosis not present

## 2022-12-07 DIAGNOSIS — M25562 Pain in left knee: Secondary | ICD-10-CM | POA: Insufficient documentation

## 2022-12-07 DIAGNOSIS — G8929 Other chronic pain: Secondary | ICD-10-CM | POA: Insufficient documentation

## 2022-12-07 NOTE — Assessment & Plan Note (Signed)
History of L4-L5 laminectomy with fusion with Dr. Cari Caraway 01/29/2021.  Has persistent axial low back pain that is nonradiating, not radicular in nature.  MRI from 11/11/2022 shows adjacent segment disease with facet hypertrophy at L3-L4 and also at L2-L3.  Referred here for diagnostic lumbar facet medial branch nerve blocks.  Risks and benefits reviewed and patient would like to proceed with L2, L3, L4 medial branch nerve block under fluoroscopy.

## 2022-12-07 NOTE — Telephone Encounter (Signed)
Noted  

## 2022-12-07 NOTE — Assessment & Plan Note (Signed)
Discussed possible left genicular nerve block and possible RFA

## 2022-12-07 NOTE — Progress Notes (Signed)
Patient: Christopher Huang  Service Category: E/M  Provider: Gillis Santa, MD  DOB: 1951-08-28  DOS: 12/07/2022  Referring Provider: Loleta Dicker, PA  MRN: [id number]  Setting: Ambulatory outpatient  PCP: Venita Lick, NP  Type: New Patient  Specialty: Interventional Pain Management    Location: Office  Delivery: Face-to-face     Primary Reason(s) for Visit: Encounter for initial evaluation of one or more chronic problems (new to examiner) potentially causing chronic pain, and posing a threat to normal musculoskeletal function. (Level of risk: High) CC: Back Pain  HPI  Christopher Huang is a 72 y.o. year old, male patient, who comes for the first time to our practice referred by Loleta Dicker, PA for our initial evaluation of his chronic pain. He has Allergic rhinitis; History of prostate cancer; Hyperlipidemia; Hypertension; Personal history of kidney stones; Personal history of colonic polyps; Arthritis of hand; Advanced care planning/counseling discussion; Knee joint replacement status, left; History of back surgery; Vitamin D deficiency; Frontotemporal dementia ([place]); LBBB (left bundle branch block); Heart failure with reduced ejection fraction ([place]); Polyp of ascending colon; Lumbar facet arthropathy; Spinal stenosis, lumbar region, without neurogenic claudication (L3/4); Lumbar degenerative disc disease; and Chronic knee pain after total replacement of left knee joint on their problem list. Today he comes in for evaluation of his Back Pain  Pain Assessment: Location: Lower Back Radiating: denies Onset: More than a month ago Duration: Chronic pain Quality: Aching, Dull, Discomfort, Nagging Severity: 5 /10 (subjective, self-reported pain score)  Effect on ADL: prolonged walking and standing, bending. lifting ( better when patient has on back brace) Timing: Intermittent Modifying factors: sitting and resting BP: (!) 143/67  HR: 64  Onset and Duration: Present longer than 3  months Cause of pain: Surgery Severity: Getting worse Timing: Not influenced by the time of the day and During activity or exercise Aggravating Factors: Bending, Prolonged standing, and Walking Alleviating Factors: Lying down, Medications, Resting, Sitting, and Using a brace Associated Problems: Fatigue and Weakness Quality of Pain: Aching and Dull Previous Examinations or Tests: MRI scan Previous Treatments: Epidural steroid injections  Christopher Huang is a very pleasant 72 year old male who is accompanied today by his wife for axial low back pain as well as left knee pain.  He is status post L4-L5 laminectomy with fusion with Dr. Cari Caraway 01/29/2021 which significantly decreased his low back pain however over the last 4 months, he has noticed an increase in his back pain and finds it difficult to walk an extended period of time due to pain.  He denies any radiation into his legs or leg weakness.  Recent MRI results are below which shows L3-L4 broad-based disc bulge with severe central canal stenosis as well as advanced facet hypertrophy at L3-L4 as well as L2-L3.  He does have adjacent segment disease above his fusion hardware.  He is being referred for diagnostic lumbar facet medial branch nerve blocks.  He also has a history of left knee replacement endorses this persistent left knee pain with prolonged weightbearing.  We discussed left genicular nerve block and possible RFA.  He does have a history of dementia, expressed caution with sedating, medications that will make the patient drowsy or affect his cognition.  He has tried gabapentin in the past which was not helpful and did cause dizziness.  Meds   Current Outpatient Medications:    Acetaminophen (ACETAMINOPHEN EXTRA STRENGTH) 500 MG capsule, Take 1 capsule by mouth every 6 (six) hours as needed for fever., Disp: ,  Rfl:    ASPIRIN 81 PO, Take 81 mg by mouth daily., Disp: , Rfl:    carvedilol (COREG) 3.125 MG tablet, Take 1 tablet (3.125 mg  total) by mouth 2 (two) times daily., Disp: 180 tablet, Rfl: 3   Cholecalciferol (VITAMIN D3) 50 MCG (2000 UT) TABS, Take 2,000 Units by mouth daily., Disp: , Rfl:    Coenzyme Q10 100 MG TABS, Take 100 mg by mouth daily., Disp: , Rfl:    docusate sodium (COLACE) 100 MG capsule, Take 100 mg by mouth daily., Disp: , Rfl:    donepezil (ARICEPT) 5 MG tablet, Take 1 tablet (5 mg total) by mouth at bedtime., Disp: 90 tablet, Rfl: 4   fexofenadine (ALLEGRA ALLERGY) 180 MG tablet, Take 1 tablet (180 mg total) by mouth daily., Disp: 10 tablet, Rfl: 1   fluticasone (FLONASE) 50 MCG/ACT nasal spray, PLACE 2 SPRAYS INTO EACH NOSTRIL DAILY., Disp: 48 mL, Rfl: 3   Multiple Vitamin (MULTIVITAMIN WITH MINERALS) TABS tablet, Take 1 tablet by mouth daily. Men's Multivitamin 50+, Disp: , Rfl:    rosuvastatin (CRESTOR) 40 MG tablet, Take 1 tablet (40 mg total) by mouth daily. Stop taking 20 MG tablets., Disp: 90 tablet, Rfl: 4   sacubitril-valsartan (ENTRESTO) 49-51 MG, Take 1 tablet by mouth 2 (two) times daily., Disp: 60 tablet, Rfl: 6   vitamin B-12 (CYANOCOBALAMIN) 500 MCG tablet, Take 500 mcg by mouth daily., Disp: , Rfl:   Imaging Review  MR LUMBAR SPINE WO CONTRAST  Narrative CLINICAL DATA:  Lumbar radiculopathy, prior surgery, new symptoms. Patient reports surgery 5 years ago with 2 years of progressive central low back pain.  EXAM: MRI LUMBAR SPINE WITHOUT CONTRAST  TECHNIQUE: Multiplanar, multisequence MR imaging of the lumbar spine was performed. No intravenous contrast was administered.  COMPARISON:  MRI of lumbar spine 06/20/2020. Flexion extension lumbar spine radiographs 10/29/2022.  FINDINGS: Segmentation:  Partial lumbarization of S1 again noted.  Alignment: Anterolisthesis at L4-5 is partially reduced in surgery. Slight anterolisthesis at L3-4 and slight retrolisthesis at L2-3 is stable. Rightward curvature is centered at L3-4.  Vertebrae: Chronic fatty endplate marrow changes are  again noted at L2-3, right greater than left. Hemangioma is present on the left at L3. Marrow signal and vertebral body heights are otherwise normal.  Conus medullaris and cauda equina: Conus extends to the T12 level. Conus and cauda equina appear normal.  Paraspinal and other soft tissues: Limited imaging the abdomen is unremarkable. There is no significant adenopathy. No solid organ lesions are present.  Disc levels:  L1-2: Mild facet hypertrophy present bilaterally. No significant disc protrusion or stenosis is present.  L2-3: Chronic loss of disc height is present. Moderate facet hypertrophy is stable. Mild crowding of nerve roots centrally is similar the prior exam. Moderate foraminal stenosis is stable, right greater than left.  L3-4: Progressive adjacent level disease is present. A broad-based disc protrusion is present. Progressive advanced facet hypertrophy is noted. This results in moderate to severe central canal stenosis with crowding of the nerve roots and left greater than right subarticular stenosis. Mild foraminal narrowing bilaterally has progressed.  L4-5: Laminectomy and fusion noted. The central canal and foramina are decompressed. No residual or recurrent stenosis is present.  L5-S1: A leftward disc protrusion extends into the neural foramen. Endplate spurring is also present. Some facet spurring is noted. The central canal is patent. Moderate left neural foraminal stenosis is stable.  S1-2: No significant disc protrusion or stenosis is present.  IMPRESSION: 1. Laminectomy and  fusion at L4-5 without residual or recurrent stenosis. 2. Progressive adjacent level disease at L3-4 with moderate to severe central canal stenosis, crowding of the nerve roots, and left greater than right subarticular stenosis. 3. Mild foraminal narrowing bilaterally at L3-4 has progressed. 4. Moderate left neural foraminal stenosis at L5-S1 is stable. 5. Chronic loss of disc  height and moderate facet hypertrophy at L2-3 with mild crowding of the nerve roots centrally and moderate foraminal stenosis bilaterally, right greater than left.   Electronically Signed By: San Morelle M.D. On: 11/14/2022 17:20   DG Lumbar Spine 2-3 Views  Narrative CLINICAL DATA:  Low back pain, status post lumbar fusion. Acute bilateral low back pain without sciatica.  EXAM: LUMBAR SPINE - 2-3 VIEW  COMPARISON:  Lumbar radiograph 01/30/2021  FINDINGS: Standing lateral flexion and extension views only obtained. Previous imaging demonstrates partially lumbarized S1. Posterior hardware is at L4-L5 consistent with prior exam. The hardware is intact on these lateral views. There is an interbody spacer at L4-L5.  5 mm anterolisthesis of L3 on L4 is unchanged on flexion and extension. Trace retrolisthesis of L2 on L3 is also unchanged. Progressive L2-L3 disc space loss from prior exam. Slight increase in L3-L4 disc space loss. Moderate L2-L3 and mild L3-L4 facet hypertrophy. No vertebral body compression fracture.  IMPRESSION: 1. Grade 1 anterolisthesis of L3 on L4, unchanged on flexion and extension. 2. Trace retrolisthesis of L2 on L3 is also unchanged on flexion and extension. 3. Posterior L4-L5 fusion hardware, intact on the lateral views. 4. Degenerative disc disease and facet hypertrophy as described.   Electronically Signed By: Keith Rake M.D. On: 10/31/2022 01:17  CT KNEE LEFT WO CONTRAST  Narrative CLINICAL DATA:  Left knee pain for years  EXAM: CT OF THE LEFT KNEE WITHOUT CONTRAST  TECHNIQUE: Multidetector CT imaging of the LEFT knee was performed according to the standard protocol. Multiplanar CT image reconstructions were also generated.  COMPARISON:  None.  FINDINGS: Bones/Joint/Cartilage  No fracture or dislocation. Normal alignment.  No aggressive osseous lesion. No periosteal reaction or bone destruction. Moderate  tricompartmental joint space narrowing with subchondral sclerosis and marginal osteophytes most consistent with osteoarthritis most severe in the medial femorotibial compartment. Small joint effusion. Moderate-sized Baker's cyst.  Mild osteoarthritis of the left sacroiliac joint. Left hip joint space is maintained. No acute osseous abnormality of the left hip.  No acute osseous abnormality of the ankle. Mild osteoarthritis of the talonavicular joint. Moderate osteoarthritis of the navicular-cuneiform joint. Severe osteoarthritis of the second tarsometatarsal joint. Mild osteoarthritis of the first tarsometatarsal joint.  Ligaments  Ligaments are suboptimally evaluated by CT.  Muscles and Tendons Muscles are normal.  No muscle atrophy.  Soft tissue No fluid collection or hematoma.  No soft tissue mass.  IMPRESSION: 1. Tricompartmental osteoarthritis of left knee.   Electronically Signed By: Kathreen Devoid On: 08/17/2019 14:26    Narrative CLINICAL DATA:  Status post total knee arthroplasty  EXAM: LEFT KNEE - 1-2 VIEW  COMPARISON:  None.  FINDINGS: The patient is status post total knee arthroplasty on the left. There are expected postsurgical changes including subcutaneous gas and overlying soft tissue edema. Skin staples are noted. There is a large joint effusion. The alignment appears near anatomic. There is no evidence for immediate hardware fracture or failure.  IMPRESSION: Status post left total knee arthroplasty with expected postsurgical changes.   Electronically Signed By: Constance Holster M.D. On: 02/09/2020 17:06   Complexity Note: Imaging results reviewed.  ROS  Cardiovascular: Abnormal heart rhythm and Daily Aspirin intake Pulmonary or Respiratory: No reported pulmonary signs or symptoms such as wheezing and difficulty taking a deep full breath (Asthma), difficulty blowing air out (Emphysema), coughing up mucus  (Bronchitis), persistent dry cough, or temporary stoppage of breathing during sleep Neurological: No reported neurological signs or symptoms such as seizures, abnormal skin sensations, urinary and/or fecal incontinence, being born with an abnormal open spine and/or a tethered spinal cord Psychological-Psychiatric: No reported psychological or psychiatric signs or symptoms such as difficulty sleeping, anxiety, depression, delusions or hallucinations (schizophrenial), mood swings (bipolar disorders) or suicidal ideations or attempts Gastrointestinal: No reported gastrointestinal signs or symptoms such as vomiting or evacuating blood, reflux, heartburn, alternating episodes of diarrhea and constipation, inflamed or scarred liver, or pancreas or irrregular and/or infrequent bowel movements Genitourinary: No reported renal or genitourinary signs or symptoms such as difficulty voiding or producing urine, peeing blood, non-functioning kidney, kidney stones, difficulty emptying the bladder, difficulty controlling the flow of urine, or chronic kidney disease Hematological: No reported hematological signs or symptoms such as prolonged bleeding, low or poor functioning platelets, bruising or bleeding easily, hereditary bleeding problems, low energy levels due to low hemoglobin or being anemic Endocrine: No reported endocrine signs or symptoms such as high or low blood sugar, rapid heart rate due to high thyroid levels, obesity or weight gain due to slow thyroid or thyroid disease Rheumatologic: Joint aches and or swelling due to excess weight (Osteoarthritis) Musculoskeletal: Negative for myasthenia gravis, muscular dystrophy, multiple sclerosis or malignant hyperthermia Work History: Retired  Allergies  Mr. Olesen is allergic to percocet [oxycodone-acetaminophen], tramadol, and vicodin [hydrocodone-acetaminophen].  Laboratory Chemistry Profile   Renal Lab Results  Component Value Date   BUN 15 10/28/2022    CREATININE 0.84 10/28/2022   BCR 18 10/28/2022   GFRAA 103 08/16/2020   GFRNONAA >60 12/03/2020   SPECGRAV 1.025 03/27/2022   PHUR 7.0 03/27/2022   PROTEINUR Negative 03/27/2022     Electrolytes Lab Results  Component Value Date   NA 141 10/28/2022   K 4.4 10/28/2022   CL 102 10/28/2022   CALCIUM 9.3 10/28/2022     Hepatic Lab Results  Component Value Date   AST 27 10/28/2022   ALT 30 10/28/2022   ALBUMIN 4.5 10/28/2022   ALKPHOS 88 10/28/2022     ID Lab Results  Component Value Date   SARSCOV2NAA Not Detected 04/28/2022   STAPHAUREUS POSITIVE (A) 12/03/2020   MRSAPCR NEGATIVE 12/03/2020     Bone Lab Results  Component Value Date   VD25OH 33.0 10/28/2022     Endocrine Lab Results  Component Value Date   GLUCOSE 105 (H) 10/28/2022   GLUCOSEU Negative 03/27/2022   HGBA1C 5.6 09/26/2021   TSH 1.820 03/27/2022     Neuropathy Lab Results  Component Value Date   VITAMINB12 462 03/27/2022   HGBA1C 5.6 09/26/2021     CNS No results found for: "COLORCSF", "APPEARCSF", "RBCCOUNTCSF", "WBCCSF", "POLYSCSF", "LYMPHSCSF", "EOSCSF", "PROTEINCSF", "GLUCCSF", "JCVIRUS", "CSFOLI", "IGGCSF", "LABACHR", "ACETBL"   Inflammation (CRP: Acute  ESR: Chronic) No results found for: "CRP", "ESRSEDRATE", "LATICACIDVEN"   Rheumatology No results found for: "RF", "ANA", "LABURIC", "URICUR", "LYMEIGGIGMAB", "LYMEABIGMQN", "HLAB27"   Coagulation Lab Results  Component Value Date   INR 1.0 12/03/2020   LABPROT 12.7 12/03/2020   APTT 32 12/03/2020   PLT 209 10/28/2022     Cardiovascular Lab Results  Component Value Date   HGB 15.1 10/28/2022   HCT 45.1 10/28/2022  Screening Lab Results  Component Value Date   SARSCOV2NAA Not Detected 04/28/2022   STAPHAUREUS POSITIVE (A) 12/03/2020   MRSAPCR NEGATIVE 12/03/2020     Cancer No results found for: "CEA", "CA125", "LABCA2"   Allergens No results found for: "ALMOND", "APPLE", "ASPARAGUS", "AVOCADO", "BANANA",  "BARLEY", "BASIL", "BAYLEAF", "GREENBEAN", "LIMABEAN", "WHITEBEAN", "BEEFIGE", "REDBEET", "BLUEBERRY", "BROCCOLI", "CABBAGE", "MELON", "CARROT", "CASEIN", "CASHEWNUT", "CAULIFLOWER", "CELERY"     Note: Lab results reviewed.  [place]  Drug: Mr. Heidrick  reports no history of drug use. Alcohol:  reports no history of alcohol use. Tobacco:  reports that he has never smoked. He has never used smokeless tobacco. Medical:  has a past medical history of Allergic rhinitis, Cancer of prostate ([place]) (02/2007), Dementia ([place]), History of kidney stones, Hyperlipidemia, Hypertension, and Personal history of kidney stones. Family: family history includes Dementia in his mother; Prostate cancer in his father.  Past Surgical History:  Procedure Laterality Date   ANTERIOR LATERAL LUMBAR FUSION WITH PERCUTANEOUS SCREW 1 LEVEL N/A 01/29/2021   Procedure: L4-5 LATERAL INTERBODY FUSION, L4-5 POSTERIOR FUSION;  Surgeon: Meade Maw, MD;  Location: [facility] ORS;  Service: Neurosurgery;  Laterality: N/A;   basal skin cancers     CATARACT EXTRACTION, BILATERAL     2021   COLONOSCOPY WITH PROPOFOL N/A 11/09/2017   Procedure: COLONOSCOPY WITH PROPOFOL;  Surgeon: Lucilla Lame, MD;  [facility] ENDOSCOPY;  Service: Endoscopy;  Laterality: N/A;   COLONOSCOPY WITH PROPOFOL N/A 12/01/2022   Procedure: COLONOSCOPY WITH PROPOFOL;  Surgeon: Lucilla Lame, MD;  Location: [facility] END[facility]COPY;  Service: Endoscopy;  Laterality: N/A;   EYE SURGERY Bilateral    Cataract   HAND SURGERY     THUMB SURGERY   KNEE ARTHROSCOPY     prostate cancer removed     TONSILLECTOMY     TONSILLECTOMY     TOTAL KNEE ARTHROPLASTY Left 02/09/2020   Procedure: TOTAL KNEE ARTHROPLASTY - RNFA;  Surgeon: Hessie Knows, MD;  Location: [facility] ORS;  Service: Orthopedics;  Laterality: Left;   Active Ambulatory Problems    Diagnosis Date Noted   Allergic rhinitis    History of prostate cancer    Hyperlipidemia    Hypertension    Personal history of kidney  stones    Personal history of colonic polyps    Arthritis of hand 11/25/2018   Advanced care planning/counseling discussion 02/02/2019   Knee joint replacement status, left 02/09/2020   History of back surgery 03/07/2021   Vitamin D deficiency 09/26/2021   Frontotemporal dementia ([place]) 03/27/2022   LBBB (left bundle branch block) 05/24/2022   Heart failure with reduced ejection fraction ([place]) 10/25/2022   Polyp of ascending colon 12/01/2022   Lumbar facet arthropathy 12/07/2022   Spinal stenosis, lumbar region, without neurogenic claudication (L3/4) 12/07/2022   Lumbar degenerative disc disease 12/07/2022   Chronic knee pain after total replacement of left knee joint 12/07/2022   Resolved Ambulatory Problems    Diagnosis Date Noted   Chronic maxillary sinusitis 01/17/2016   Benign neoplasm of cecum    Benign neoplasm of transverse colon    Polyp of sigmoid colon    Sinusitis, acute, maxillary 11/25/2018   Abnormal EKG 02/07/2020   Insomnia 02/07/2020   Preoperative clearance 11/26/2020   Anterolisthesis 01/29/2021   Suspected COVID-19 virus infection 03/10/2021   Obesity (BMI 30-39.9) 03/24/2021   Acute sinusitis 12/05/2021   Irregular heart beat 03/27/2022   Upper respiratory tract infection 04/28/2022   Past Medical History:  Diagnosis Date   Cancer of prostate ([place])  02/2007   Dementia ([place])    History of kidney stones    Constitutional Exam  General appearance: Well nourished, well developed, and well hydrated. In no apparent acute distress Vitals:   12/07/22 1244  BP: (!) 143/67  Pulse: 64  Resp: 16  Temp: (!) 97.2 F (36.2 C)  SpO2: 97%  Weight: 225 lb (102.1 kg)  Height: '6\' 1"'$  (1.854 m)   BMI Assessment: Estimated body mass index is 29.69 kg/m as calculated from the following:   Height as of this encounter: '6\' 1"'$  (1.854 m).   Weight as of this encounter: 225 lb (102.1 kg).  BMI interpretation table: BMI level Category Range association with higher  incidence of chronic pain  <18 kg/m2 Underweight   18.5-24.9 kg/m2 Ideal body weight   25-29.9 kg/m2 Overweight Increased incidence by 20%  30-34.9 kg/m2 Obese (Class I) Increased incidence by 68%  35-39.9 kg/m2 Severe obesity (Class II) Increased incidence by 136%  >40 kg/m2 Extreme obesity (Class III) Increased incidence by 254%   Patient's current BMI Ideal Body weight  Body mass index is 29.69 kg/m. Ideal body weight: 79.9 kg (176 lb 2.4 oz) Adjusted ideal body weight: 88.8 kg (195 lb 11 oz)   BMI Readings from Last 4 Encounters:  12/07/22 29.69 kg/m  12/01/22 29.69 kg/m  11/25/22 30.08 kg/m  10/29/22 29.85 kg/m   Wt Readings from Last 4 Encounters:  12/07/22 225 lb (102.1 kg)  12/01/22 225 lb (102.1 kg)  11/25/22 228 lb (103.4 kg)  10/29/22 226 lb 3.2 oz (102.6 kg)    Psych/Mental status: Alert, oriented x 3 (person, place, & time)       Eyes: PERLA Respiratory: No evidence of acute respiratory distress  Thoracic Spine Area Exam  Skin & Axial Inspection: No masses, redness, or swelling Alignment: Symmetrical Functional ROM: Unrestricted ROM Stability: No instability detected Muscle Tone/Strength: Functionally intact. No obvious neuro-muscular anomalies detected. Sensory (Neurological): Unimpaired Muscle strength & Tone: No palpable anomalies Lumbar Spine Area Exam  Skin & Axial Inspection: Well healed scar from previous spine surgery detected Alignment: Symmetrical Functional ROM: Unrestricted ROM       Stability: No instability detected Muscle Tone/Strength: Functionally intact. No obvious neuro-muscular anomalies detected. Sensory (Neurological): Musculoskeletal pain pattern Palpation: No palpable anomalies       Provocative Tests: Hyperextension/rotation test: (+) bilaterally for facet joint pain. Lumbar quadrant test (Kemp's test): (+) bilaterally for facet joint pain. Lateral bending test: Unable to perform due to fusion restriction.  Gait & Posture  Assessment  Ambulation: Unassisted Gait: Relatively normal for age and body habitus Posture: WNL  Lower Extremity Exam    Side: Right lower extremity  Side: Left lower extremity  Stability: No instability observed          Stability: No instability observed          Skin & Extremity Inspection: Skin color, temperature, and hair growth are WNL. No peripheral edema or cyanosis. No masses, redness, swelling, asymmetry, or associated skin lesions. No contractures.  Skin & Extremity Inspection: Evidence of prior arthroplastic surgery  Functional ROM: Unrestricted ROM                  Functional ROM: Pain restricted ROM for knee joint          Muscle Tone/Strength: Functionally intact. No obvious neuro-muscular anomalies detected.  Muscle Tone/Strength: Functionally intact. No obvious neuro-muscular anomalies detected.  Sensory (Neurological): Unimpaired        Sensory (Neurological): Arthropathic arthralgia  DTR: Patellar: deferred today Achilles: deferred today Plantar: deferred today  DTR: Patellar: deferred today Achilles: deferred today Plantar: deferred today  Palpation: No palpable anomalies  Palpation: No palpable anomalies    Assessment  Primary Diagnosis & Pertinent Problem List: The primary encounter diagnosis was Lumbar facet arthropathy. Diagnoses of Spinal stenosis, lumbar region, without neurogenic claudication (L3/4), Lumbar degenerative disc disease, and Chronic knee pain after total replacement of left knee joint were also pertinent to this visit.  Visit Diagnosis (New problems to examiner): 1. Lumbar facet arthropathy   2. Spinal stenosis, lumbar region, without neurogenic claudication (L3/4)   3. Lumbar degenerative disc disease   4. Chronic knee pain after total replacement of left knee joint    Plan of Care    Problem-specific plan: Lumbar facet arthropathy History of L4-L5 laminectomy with fusion with Dr. Cari Caraway 01/29/2021.  Has persistent axial low back  pain that is nonradiating, not radicular in nature.  MRI from 11/11/2022 shows adjacent segment disease with facet hypertrophy at L3-L4 and also at L2-L3.  Referred here for diagnostic lumbar facet medial branch nerve blocks.  Risks and benefits reviewed and patient would like to proceed with L2, L3, L4 medial branch nerve block under fluoroscopy.  Spinal stenosis, lumbar region, without neurogenic claudication (L3/4) Adjacent segment disease with spinal canal stenosis severe at L3-L4.  Patient denies any weakness and is not having any neurogenic claudication.  Future considerations could include epidural steroid injection at L3-L4  Lumbar degenerative disc disease Stable, utilizing back brace.  Chronic knee pain after total replacement of left knee joint Discussed possible left genicular nerve block and possible RFA   Procedure Orders         LUMBAR FACET(MEDIAL BRANCH NERVE BLOCK) MBNB     Depending upon how patient responds to his diagnostic lumbar facet medial branch nerve blocks, future considerations could also include spinal cord stimulation versus multifidus/ medial branch peripheral nerve stimulation  Interventional management options: Mr. Wiltsey was informed that there is no guarantee that he would be a candidate for interventional therapies. The decision will be based on the results of diagnostic studies, as well as Mr. Porcaro risk profile.  Procedure(s) under consideration:  Diagnostic L2, L3, L4 medial branch nerve block, bilateral, possible RFA L2-L3 ESI Lumbar medial branch peripheral nerve stimulation versus spinal cord stimulation Left genicular nerve block and possible RFA    Provider-requested follow-up: Return in about 1 week (around 12/14/2022) for B/L MBNB PO Valium.  Future Appointments  Date Time Provider [place]  12/10/2022 11:00 AM Darlina Guys, [place] [facility] None  12/21/2022 10:20 AM Gillis Santa, MD [facility] None  01/05/2023  9:30 AM Loleta Dicker, PA AS-AS None  01/15/2023  1:00 PM CFP CCM CASE MANAGER [facility]  01/29/2023  8:30 AM Hazle Coca, PhD [facility] None  01/29/2023  9:30 AM LBN- NEUROPSYCH TECH [facility] None  02/09/2023 10:00 AM Hazle Coca, PhD [facility] None  03/01/2023 11:40 AM Minna Merritts, MD [facility] None  03/30/2023  8:40 AM Venita Lick, NP [facility]  06/21/2023  3:30 PM Genia Harold, MD [facility] None    Duration of encounter: 34mnutes.  Total time on encounter, as per AMA guidelines included both the face-to-face and non-face-to-face time personally spent by the physician and/or other qualified health care professional(s) on the day of the encounter (includes time in activities that require the physician or other qualified health care professional and does not include time in activities normally performed by clinical  staff). Physician's time may include the following activities when performed: Preparing to see the patient (e.g., pre-charting review of records, searching for previously ordered imaging, lab work, and nerve conduction tests) Review of prior analgesic pharmacotherapies. Reviewing PMP Interpreting ordered tests (e.g., lab work, imaging, nerve conduction tests) Performing post-procedure evaluations, including interpretation of diagnostic procedures Obtaining and/or reviewing separately obtained history Performing a medically appropriate examination and/or evaluation Counseling and educating the patient/family/caregiver Ordering medications, tests, or procedures Referring and communicating with other health care professionals (when not separately reported) Documenting clinical information in the electronic or other health record Independently interpreting results (not separately reported) and communicating results to the patient/ family/caregiver Care coordination (not separately reported)  Note by: Gillis Santa, MD Date: 12/07/2022; Time: 3:01 PM

## 2022-12-07 NOTE — Progress Notes (Signed)
Safety precautions to be maintained throughout the outpatient stay will include: orient to surroundings, keep bed in low position, maintain call bell within reach at all times, provide assistance with transfer out of bed and ambulation.  

## 2022-12-07 NOTE — Assessment & Plan Note (Signed)
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  Patient denies any weakness and is not having any neurogenic claudication.  Future considerations could include epidural steroid injection at L3-L4

## 2022-12-07 NOTE — Patient Instructions (Signed)
______________________________________________________________________  Preparing for your procedure  During your procedure appointment there will be: No Prescription Refills. No disability issues to discussed. No medication changes or discussions.  Instructions: Food intake: Avoid eating anything solid for at least 8 hours prior to your procedure. Clear liquid intake: You may take clear liquids such as water up to 2 hours prior to your procedure. (No carbonated drinks. No soda.) Transportation: Unless otherwise stated by your physician, bring a driver. Morning Medicines: Except for blood thinners, take all of your other morning medications with a sip of water. Make sure to take your heart and blood pressure medicines. If your blood pressure's lower number is above 100, the case will be rescheduled. Blood thinners: Make sure to stop your blood thinners as instructed.  If you take a blood thinner, but were not instructed to stop it, call our office [phone] and ask to talk to a nurse. Not stopping a blood thinner prior to certain procedures could lead to serious complications. Diabetics on insulin: Notify the staff so that you can be scheduled 1st case in the morning. If your diabetes requires high dose insulin, take only  of your normal insulin dose the morning of the procedure and notify the staff that you have done so. Preventing infections: Shower with an antibacterial soap the morning of your procedure.  Build-up your immune system: Take 1000 mg of Vitamin C with every meal (3 times a day) the day prior to your procedure. Antibiotics: Inform the nursing staff if you are taking any antibiotics or if you have any conditions that may require antibiotics prior to procedures. (Example: recent joint implants)   Pregnancy: If you are pregnant make sure to notify the nursing staff. Not doing so may result in injury to the fetus, including death.  Sickness: If you have a cold, fever, or any  active infections, call and cancel or reschedule your procedure. Receiving steroids while having an infection may result in complications. Arrival: You must be in the facility at least 30 minutes prior to your scheduled procedure. Tardiness: Your scheduled time is also the cutoff time. If you do not arrive at least 15 minutes prior to your procedure, you will be rescheduled.  Children: Do not bring any children with you. Make arrangements to keep them home. Dress appropriately: There is always a possibility that your clothing may get soiled. Avoid long dresses. Valuables: Do not bring any jewelry or valuables.  Reasons to call and reschedule or cancel your procedure: (Following these recommendations will minimize the risk of a serious complication.) Surgeries: Avoid having procedures within 2 weeks of any surgery. (Avoid for 2 weeks before or after any surgery). Flu Shots: Avoid having procedures within 2 weeks of a flu shots or . (Avoid for 2 weeks before or after immunizations). Barium: Avoid having a procedure within 7-10 days after having had a radiological study involving the use of radiological contrast. (Myelograms, Barium swallow or enema study). Heart attacks: Avoid any elective procedures or surgeries for the initial 6 months after a "Myocardial Infarction" (Heart Attack). Blood thinners: It is imperative that you stop these medications before procedures. Let us know if you if you take any blood thinner.  Infection: Avoid procedures during or within two weeks of an infection (including chest colds or gastrointestinal problems). Symptoms associated with infections include: Localized redness, fever, chills, night sweats or profuse sweating, burning sensation when voiding, cough, congestion, stuffiness, runny nose, sore throat, diarrhea, nausea, vomiting, cold or Flu symptoms, recent or   current infections. It is specially important if the infection is over the area that we intend to treat. Heart  and lung problems: Symptoms that may suggest an active cardiopulmonary problem include: cough, chest pain, breathing difficulties or shortness of breath, dizziness, ankle swelling, uncontrolled high or unusually low blood pressure, and/or palpitations. If you are experiencing any of these symptoms, cancel your procedure and contact your primary care physician for an evaluation.  Remember:  Regular Business hours are:  Monday to Thursday 8:00 AM to 4:00 PM  Provider's Schedule: Francisco Naveira, MD:  Procedure days: Tuesday and Thursday 7:30 AM to 4:00 PM  Bilal Lateef, MD:  Procedure days: Monday and Wednesday 7:30 AM to 4:00 PM  ______________________________________________________________________    

## 2022-12-07 NOTE — Assessment & Plan Note (Signed)
Stable, utilizing back brace.

## 2022-12-10 ENCOUNTER — Other Ambulatory Visit: Payer: PPO

## 2022-12-10 NOTE — Progress Notes (Signed)
12/10/2022 Name: Christopher Huang MRN: [id number] DOB: 1951-02-23  Chief Complaint  Patient presents with   Medication Assistance    Entresto    BENJAMYN HESTAND is a 72 y.o. year old male who presented for a telephone visit.   They were referred to the pharmacist by their PCP for assistance in managing medication access.   Patient is participating in a Managed Medicaid Plan:  No  Subjective: Patient referred to pharmacy to assist with affordability of Entresto.  Patient has been paying $45 for a 1 month supply at [facility] Care Team: Primary Care Provider: Venita Lick, NP  Medication Access/Adherence  Current Pharmacy:  [id number] [place] [id number]: [phone]: [phone] [facility] [place] [facility] [id number]: [phone]: [phone]([facility] [address] [place] [id number]: [phone]: [phone] Patient reports affordability concerns with their medications: Yes  Patient reports access/transportation concerns to their pharmacy: No  Patient reports adherence concerns with their medications:  No , wife fills pill box with daily medications, reminds, and verifies doses taken  Objective: Reported home BP's around 120/60, most recent value recorded 122/63 Lab Results  Component Value Date   HGBA1C 5.6 09/26/2021    Lab Results  Component Value Date   CREATININE 0.84 10/28/2022   BUN 15 10/28/2022   NA 141 10/28/2022   K 4.4 10/28/2022   CL 102 10/28/2022   CO2 27 10/28/2022    Lab Results  Component Value Date   CHOL 131 10/28/2022   HDL 55 10/28/2022   LDLCALC 53 10/28/2022   TRIG 132 10/28/2022   CHOLHDL 4.4 02/15/2018    Medications Reviewed Today      Reviewed by GDarlina Guys RAsharoken(Pharmacist) on 12/10/22 at 1137  Med List Status: <None>   Medication Order Taking? Sig Documenting Provider Last Dose Status Informant  Acetaminophen (ACETAMINOPHEN EXTRA STRENGTH) 500 MG capsule [id number] Take 1 capsule by mouth every 6 (six) hours as needed for fever. [provider]  Active            Med Note (Colin Rhein Bonniejean Piano A   Thu Dec 10, 2022 11:13 AM) Takes prn headaches  ASPIRIN 81 PO [id number] Take 81 mg by mouth daily. [provider]  Active Spouse/Significant Other  carvedilol (COREG) 3.125 MG tablet [id number] Take 1 tablet (3.125 mg total) by mouth 2 (two) times daily. GMinna Merritts MD  Active   Cholecalciferol (VITAMIN D3) 50 MCG (2000 UT) TABS [id number] Take 2,000 Units by mouth daily. [provider]  Active Spouse/Significant Other  Coenzyme Q10 100 MG TABS [id number] Take 100 mg by mouth daily. [provider]  Active Spouse/Significant Other  docusate sodium (COLACE) 100 MG capsule [id number] Take 100 mg by mouth daily. [provider]  Active            Med Note (Colin Rhein Demarcus Thielke A   Thu Dec 10, 2022 11:18 AM) As needed  donepezil (ARICEPT) 5 MG tablet [id number] Take 1 tablet (5 mg total) by mouth at bedtime. CMarnee GuarneriT, NP  Active   fexofenadine ([facility] Center[facility]) 180 MG tablet [id number] Take 1 tablet (180 mg total)  by mouth daily. Vigg, Avanti, MD  Active   fluticasone (FLONASE) 50 MCG/ACT nasal spray [id number] Yes PLACE 2 SPRAYS INTO EACH NOSTRIL DAILY. Venita Lick, NP  Active   Misc Natural Products (RELAX & SLEEP PO) [id number] Yes Take by mouth. Reports taking "Relaxium" nightly for sleep (contains '5mg'$  Melatonin, '100mg'$  Magnesium, L-Tryptophan, Valerest, Ashwagandha, '100mg'$  GABA, Chamomile and Passionflower) [provider]  Active Self, Spouse/Significant Other  Multiple Vitamin (MULTIVITAMIN WITH MINERALS) TABS tablet [id number] Yes Take 1  tablet by mouth daily. Men's Multivitamin 50+ [provider]  Active Spouse/Significant Other  rosuvastatin (CRESTOR) 40 MG tablet [id number] Yes Take 1 tablet (40 mg total) by mouth daily. Stop taking 20 MG tablets. Marnee Guarneri T, NP  Active   sacubitril-valsartan (ENTRESTO) 49-51 MG [id number] Yes Take 1 tablet by mouth 2 (two) times daily. Minna Merritts, MD  Active   vitamin B-12 (CYANOCOBALAMIN) 500 MCG tablet [id number] Yes Take 500 mcg by mouth daily. [provider]  Active             Assessment/Plan:  -verified that patient has medication on hand and has not been without -will forward to Medication assistance pool because after preliminary discussion, patient should qualify for [employer] assistance program -discussed blood pressure control, recent values, and adherence to medication.  Patient will continue to monitor at home and also inquired about a bottom value that is too low.  Counseled to inform PCP if regularly seeing diastolic values in the low or mid 50's.  Also discussed presentation of hypotension. -provided follow-up communication via [place] along with my contact number if needs arise.  Follow Up Plan: None at this time from a pharmacy perspective.  Thank you for involving me in the care of this patient.  Please do not hesitate to reach out with any further needs.

## 2022-12-10 NOTE — Patient Instructions (Signed)
Mr & Mrs Nabers,  It was a pleasure speaking with you today.  I just wanted to follow-up on our discussion to remind you to be on the lookout for the medication assistance application for the [facility].    Also, continue the great work with keeping an eye on Alan's blood pressure.  It sounds like values are right where we want them (<130/80); but let Jolene know if you're consistently seeing high or low readings.  Thank you and have a great afternoon!  Paulina Fusi PharmD, DPLA

## 2022-12-11 ENCOUNTER — Other Ambulatory Visit (HOSPITAL_COMMUNITY): Payer: Self-pay

## 2022-12-11 NOTE — Progress Notes (Addendum)
Mailed PAP application ENTRESTO to patient home.I will fax prescriber portion please have prescriber to review, sigh and fax to [phone] ATTENTION Zehava Turski.   [facility] Patient Advocate   I spoke to pt wife and let her know I will be mailing out PAP application for ENTRESTO (NOVARTIS)

## 2022-12-15 ENCOUNTER — Telehealth: Payer: Self-pay

## 2022-12-15 NOTE — Telephone Encounter (Signed)
Mailed PAP application ENTRESTO to patient home.I will fax prescriber portion please have prescriber to review, sigh and fax to [phone] ATTENTION [name].    [name] Patient Advocate

## 2022-12-21 ENCOUNTER — Ambulatory Visit
Admission: RE | Admit: 2022-12-21 | Discharge: 2022-12-21 | Disposition: A | Discharge status: Home / Self Care | Payer: PPO | Source: Ambulatory Visit | Attending: Student in an Organized Health Care Education/Training Program | Admitting: Student in an Organized Health Care Education/Training Program

## 2022-12-21 ENCOUNTER — Ambulatory Visit
Payer: PPO | Attending: Student in an Organized Health Care Education/Training Program | Admitting: Student in an Organized Health Care Education/Training Program

## 2022-12-21 ENCOUNTER — Encounter: Payer: Self-pay | Admitting: Student in an Organized Health Care Education/Training Program

## 2022-12-21 VITALS — BP 158/68 | HR 60 | Temp 98.2°F | Resp 16 | Ht 73.0 in | Wt 225.0 lb

## 2022-12-21 DIAGNOSIS — M5136 Other intervertebral disc degeneration, lumbar region: Secondary | ICD-10-CM

## 2022-12-21 DIAGNOSIS — M47816 Spondylosis without myelopathy or radiculopathy, lumbar region: Secondary | ICD-10-CM | POA: Diagnosis not present

## 2022-12-21 MED ORDER — DIAZEPAM 5 MG PO TABS
ORAL_TABLET | ORAL | Status: AC
  Filled 2022-12-21: qty 1

## 2022-12-21 MED ORDER — LIDOCAINE HCL 2 % IJ SOLN
20.0000 mL | Freq: Once | INTRAMUSCULAR | Status: AC
  Administered 2022-12-21: 400 mg
  Filled 2022-12-21: qty 20

## 2022-12-21 MED ORDER — ROPIVACAINE HCL 2 MG/ML IJ SOLN
9.0000 mL | Freq: Once | INTRAMUSCULAR | Status: AC
  Administered 2022-12-21: 9 mL via PERINEURAL
  Filled 2022-12-21: qty 20

## 2022-12-21 MED ORDER — DEXAMETHASONE SODIUM PHOSPHATE 10 MG/ML IJ SOLN
10.0000 mg | Freq: Once | INTRAMUSCULAR | Status: AC
  Administered 2022-12-21: 10 mg
  Filled 2022-12-21: qty 1

## 2022-12-21 MED ORDER — DIAZEPAM 5 MG PO TABS
5.0000 mg | ORAL_TABLET | ORAL | Status: AC
  Administered 2022-12-21: 5 mg via ORAL

## 2022-12-21 NOTE — Progress Notes (Signed)
PROVIDER NOTE: Interpretation of information contained herein should be left to medically-trained personnel. Specific patient instructions are provided elsewhere under "Patient Instructions" section of medical record. This document was created in part using STT-dictation technology, any transcriptional errors that may result from this process are unintentional.  Patient: Christopher Huang Type: Established DOB: 04/09/1951 MRN: [id number] PCP: Venita Lick, NP  Service: Procedure DOS: 12/21/2022 Setting: Ambulatory Location: Ambulatory outpatient facility Delivery: Face-to-face Provider: Gillis Santa, MD Specialty: Interventional Pain Management Specialty designation: 09 Location: Outpatient facility Ref. Prov.: Cannady, Barbaraann Faster, NP    Procedure:           Type: Lumbar Facet, Medial Branch Block(s) #1  Laterality: Bilateral  Level: L2, L3, and L4 Medial Branch Level(s). Injecting these levels blocks the L2-3 and L3-4 lumbar facet joints. Imaging: Fluoroscopic guidance         Anesthesia: Local anesthesia (1-2% Lidocaine) Anxiolysis: Oral Valium 5 mg DOS: 12/21/2022 Performed by: Gillis Santa, MD  Primary Purpose: Diagnostic/Therapeutic Indications: Low back pain severe enough to impact quality of life or function. 1. Lumbar facet arthropathy   2. Lumbar degenerative disc disease    NAS-11 Pain score:   Pre-procedure: 2 /10   Post-procedure: 2 /10     Position / Prep / Materials:  Position: Prone  Prep solution: DuraPrep (Iodine Povacrylex [0.7% available iodine] and Isopropyl Alcohol, 74% w/w) Area Prepped: Posterolateral Lumbosacral Spine (Wide prep: From the lower border of the scapula down to the end of the tailbone and from flank to flank.)  Materials:  Tray: Block Needle(s):  Type: Spinal  Gauge (G): 22  Length: 3.5-in   Pre-op H&P Assessment:  Mr. Romagnoli is a 72 y.o. (year old), male patient, seen today for interventional treatment. He  has a past surgical  history that includes prostate cancer removed; Tonsillectomy; Knee arthroscopy; basal skin cancers; Tonsillectomy; Colonoscopy with propofol (N/A, 11/09/2017); Hand surgery; Total knee arthroplasty (Left, 02/09/2020); Eye surgery (Bilateral); Anterior lateral lumbar fusion with percutaneous screw 1 level (N/A, 01/29/2021); Cataract extraction, bilateral; and Colonoscopy with propofol (N/A, 12/01/2022). Mr. Mages has a current medication list which includes the following prescription(s): acetaminophen extra strength, aspirin, carvedilol, vitamin d3, coenzyme q10, docusate sodium, donepezil, fexofenadine, fluticasone, misc natural products, multivitamin with minerals, rosuvastatin, sacubitril-valsartan, and cyanocobalamin. His primarily concern today is the Back Pain (Mid back)  Initial Vital Signs:  Pulse/HCG Rate: 60ECG Heart Rate: 60 Temp: 98.2 F (36.8 C) Resp: 18 BP: 137/67 SpO2: 95 %  BMI: Estimated body mass index is 29.69 kg/m as calculated from the following:   Height as of this encounter: '6\' 1"'$  (1.854 m).   Weight as of this encounter: 225 lb (102.1 kg).  Risk Assessment: Allergies: Reviewed. He is allergic to percocet [oxycodone-acetaminophen], tramadol, and vicodin [hydrocodone-acetaminophen].  Allergy Precautions: None required Coagulopathies: Reviewed. None identified.  Blood-thinner therapy: None at this time Active Infection(s): Reviewed. None identified. Mr. Yurchak is afebrile  Site Confirmation: Mr. Gallo was asked to confirm the procedure and laterality before marking the site Procedure checklist: Completed Consent: Before the procedure and under the influence of no sedative(s), amnesic(s), or anxiolytics, the patient was informed of the treatment options, risks and possible complications. To fulfill our ethical and legal obligations, as recommended by the American Medical Association's Code of Ethics, I have informed the patient of my clinical impression; the nature and purpose  of the treatment or procedure; the risks, benefits, and possible complications of the intervention; the alternatives, including doing nothing; the risk(s) and benefit(s)  of the alternative treatment(s) or procedure(s); and the risk(s) and benefit(s) of doing nothing. The patient was provided information about the general risks and possible complications associated with the procedure. These may include, but are not limited to: failure to achieve desired goals, infection, bleeding, organ or nerve damage, allergic reactions, paralysis, and death. In addition, the patient was informed of those risks and complications associated to Spine-related procedures, such as failure to decrease pain; infection (i.e.: Meningitis, epidural or intraspinal abscess); bleeding (i.e.: epidural hematoma, subarachnoid hemorrhage, or any other type of intraspinal or peri-dural bleeding); organ or nerve damage (i.e.: Any type of peripheral nerve, nerve root, or spinal cord injury) with subsequent damage to sensory, motor, and/or autonomic systems, resulting in permanent pain, numbness, and/or weakness of one or several areas of the body; allergic reactions; (i.e.: anaphylactic reaction); and/or death. Furthermore, the patient was informed of those risks and complications associated with the medications. These include, but are not limited to: allergic reactions (i.e.: anaphylactic or anaphylactoid reaction(s)); adrenal axis suppression; blood sugar elevation that in diabetics may result in ketoacidosis or comma; water retention that in patients with history of congestive heart failure may result in shortness of breath, pulmonary edema, and decompensation with resultant heart failure; weight gain; swelling or edema; medication-induced neural toxicity; particulate matter embolism and blood vessel occlusion with resultant organ, and/or nervous system infarction; and/or aseptic necrosis of one or more joints. Finally, the patient was informed  that Medicine is not an exact science; therefore, there is also the possibility of unforeseen or unpredictable risks and/or possible complications that may result in a catastrophic outcome. The patient indicated having understood very clearly. We have given the patient no guarantees and we have made no promises. Enough time was given to the patient to ask questions, all of which were answered to the patient's satisfaction. Mr. Romanello has indicated that he wanted to continue with the procedure. Attestation: I, the ordering provider, attest that I have discussed with the patient the benefits, risks, side-effects, alternatives, likelihood of achieving goals, and potential problems during recovery for the procedure that I have provided informed consent. Date  Time: 12/21/2022 10:11 AM  Pre-Procedure Preparation:  Monitoring: As per clinic protocol. Respiration, ETCO2, SpO2, BP, heart rate and rhythm monitor placed and checked for adequate function Safety Precautions: Patient was assessed for positional comfort and pressure points before starting the procedure. Time-out: I initiated and conducted the "Time-out" before starting the procedure, as per protocol. The patient was asked to participate by confirming the accuracy of the "Time Out" information. Verification of the correct person, site, and procedure were performed and confirmed by me, the nursing staff, and the patient. "Time-out" conducted as per Joint Commission's Universal Protocol (UP.01.01.01). Time: 1102  Description of Procedure:          Laterality: (see above) Targeted Levels: (see above)  Safety Precautions: Aspiration looking for blood return was conducted prior to all injections. At no point did we inject any substances, as a needle was being advanced. Before injecting, the patient was told to immediately notify me if he was experiencing any new onset of "ringing in the ears, or metallic taste in the mouth". No attempts were made at  seeking any paresthesias. Safe injection practices and needle disposal techniques used. Medications properly checked for expiration dates. SDV (single dose vial) medications used. After the completion of the procedure, all disposable equipment used was discarded in the proper designated medical waste containers. Local Anesthesia: Protocol guidelines were followed. The patient  was positioned over the fluoroscopy table. The area was prepped in the usual manner. The time-out was completed. The target area was identified using fluoroscopy. A 12-in long, straight, sterile hemostat was used with fluoroscopic guidance to locate the targets for each level blocked. Once located, the skin was marked with an approved surgical skin marker. Once all sites were marked, the skin (epidermis, dermis, and hypodermis), as well as deeper tissues (fat, connective tissue and muscle) were infiltrated with a small amount of a short-acting local anesthetic, loaded on a 10cc syringe with a 25G, 1.5-in  Needle. An appropriate amount of time was allowed for local anesthetics to take effect before proceeding to the next step. Local Anesthetic: Lidocaine 2.0% The unused portion of the local anesthetic was discarded in the proper designated containers. Technical description of process:  L2 Medial Branch Nerve Block (MBB): The target area for the L2 medial branch is at the junction of the postero-lateral aspect of the superior articular process and the superior, posterior, and medial edge of the transverse process of L3. Under fluoroscopic guidance, a Quincke needle was inserted until contact was made with os over the superior postero-lateral aspect of the pedicular shadow (target area). After negative aspiration for blood, 7m of the nerve block solution was injected without difficulty or complication. The needle was removed intact. L3 Medial Branch Nerve Block (MBB): The target area for the L3 medial branch is at the junction of the  postero-lateral aspect of the superior articular process and the superior, posterior, and medial edge of the transverse process of L4. Under fluoroscopic guidance, a Quincke needle was inserted until contact was made with os over the superior postero-lateral aspect of the pedicular shadow (target area). After negative aspiration for blood,2 mL of the nerve block solution was injected without difficulty or complication. The needle was removed intact. L4 Medial Branch Nerve Block (MBB): The target area for the L4 medial branch is at the junction of the postero-lateral aspect of the superior articular process and the superior, posterior, and medial edge of the transverse process of L5. Under fluoroscopic guidance, a Quincke needle was inserted until contact was made with os over the superior postero-lateral aspect of the pedicular shadow (target area). After negative aspiration for blood, 2 mL of the nerve block solution was injected without difficulty or complication. The needle was removed intact.   12 cc solution made of 10 cc of 0.2% ropivacaine, 2 cc of Decadron 10 mg/cc.  2 cc injected at each level above bilaterally.   Once the entire procedure was completed, the treated area was cleaned, making sure to leave some of the prepping solution back to take advantage of its long term bactericidal properties.         Illustration of the posterior view of the lumbar spine and the posterior neural structures. Laminae of L2 through S1 are labeled. DPRL5, dorsal primary ramus of L5; DPRS1, dorsal primary ramus of S1; DPR3, dorsal primary ramus of L3; FJ, facet (zygapophyseal) joint L3-L4; I, inferior articular process of L4; LB1, lateral branch of dorsal primary ramus of L1; IAB, inferior articular branches from L3 medial branch (supplies L4-L5 facet joint); IBP, intermediate branch plexus; MB3, medial branch of dorsal primary ramus of L3; NR3, third lumbar nerve root; S, superior articular process of L5; SAB,  superior articular branches from L4 (supplies L4-5 facet joint also); TP3, transverse process of L3.  Vitals:   12/21/22 1059 12/21/22 1104 12/21/22 1109 12/21/22 1113  BP: 124/62 134/67 132/71 (Marland Kitchen  158/68  Pulse:      Resp: '16 18 18 16  '$ Temp:      TempSrc:      SpO2: 95% 95% 97% 98%  Weight:      Height:         Start Time: 1102 hrs. End Time: 1112 hrs.  Imaging Guidance (Spinal):          Type of Imaging Technique: Fluoroscopy Guidance (Spinal) Indication(s): Assistance in needle guidance and placement for procedures requiring needle placement in or near specific anatomical locations not easily accessible without such assistance. Exposure Time: Please see nurses notes. Contrast: None used. Fluoroscopic Guidance: I was personally present during the use of fluoroscopy. "Tunnel Vision Technique" used to obtain the best possible view of the target area. Parallax error corrected before commencing the procedure. "Direction-depth-direction" technique used to introduce the needle under continuous pulsed fluoroscopy. Once target was reached, antero-posterior, oblique, and lateral fluoroscopic projection used confirm needle placement in all planes. Images permanently stored in EMR. Interpretation: No contrast injected. I personally interpreted the imaging intraoperatively. Adequate needle placement confirmed in multiple planes. Permanent images saved into the patient's record.  Post-operative Assessment:  Post-procedure Vital Signs:  Pulse/HCG Rate: 6062 Temp: 98.2 F (36.8 C) Resp: 16 BP: (!) 158/68 SpO2: 98 %  EBL: None  Complications: No immediate post-treatment complications observed by team, or reported by patient.  Note: The patient tolerated the entire procedure well. A repeat set of vitals were taken after the procedure and the patient was kept under observation following institutional policy, for this type of procedure. Post-procedural neurological assessment was performed,  showing return to baseline, prior to discharge. The patient was provided with post-procedure discharge instructions, including a section on how to identify potential problems. Should any problems arise concerning this procedure, the patient was given instructions to immediately contact us, at any time, without hesitation. In any case, we plan to contact the patient by telephone for a follow-up status report regarding this interventional procedure.  Comments:  No additional relevant information.  Plan of Care  Orders:  Orders Placed This Encounter  Procedures   DG PAIN [facility] C-ARM 1-60 MIN NO REPORT    Intraoperative interpretation by procedural physician at [facility].    Standing Status:   Standing    Number of Occurrences:   1    Order Specific Question:   Reason for exam:    Answer:   Assistance in needle guidance and placement for procedures requiring needle placement in or near specific anatomical locations not easily accessible without such assistance.     Medications ordered for procedure: Meds ordered this encounter  Medications   lidocaine (XYLOCAINE) 2 % (with pres) injection 400 mg   diazepam (VALIUM) tablet 5 mg    Make sure Flumazenil is available in the pyxis when using this medication. If oversedation occurs, administer 0.2 mg IV over 15 sec. If after 45 sec no response, administer 0.2 mg again over 1 min; may repeat at 1 min intervals; not to exceed 4 doses (1 mg)   dexamethasone (DECADRON) injection 10 mg   dexamethasone (DECADRON) injection 10 mg   ropivacaine (PF) 2 mg/mL (0.2%) (NAROPIN) injection 9 mL   Medications administered: We administered lidocaine, diazepam, dexamethasone, dexamethasone, and ropivacaine (PF) 2 mg/mL (0.2%).  See the medical record for exact dosing, route, and time of administration.  Follow-up plan:   Return in about 4 weeks (around 01/18/2023) for F2F PPE.      Recent Visits  Date Type Provider Dept  12/07/22 Office Visit  Gillis Santa, MD [facility]  Showing recent visits within past 90 days and meeting all other requirements Today's Visits Date Type Provider Dept  12/21/22 Procedure visit Gillis Santa, MD [facility]  Showing today's visits and meeting all other requirements Future Appointments Date Type Provider Dept  01/18/23 Appointment Gillis Santa, MD [facility]  Showing future appointments within next 90 days and meeting all other requirements  Disposition: Discharge home  Discharge (Date  Time): 12/21/2022; 1125 hrs.   Primary Care Physician: Venita Lick, NP Location: [facility] Note by: Gillis Santa, MD Date: 12/21/2022; Time: 11:38 AM  Disclaimer:  Medicine is not an exact science. The only guarantee in medicine is that nothing is guaranteed. It is important to note that the decision to proceed with this intervention was based on the information collected from the patient. The Data and conclusions were drawn from the patient's questionnaire, the interview, and the physical examination. Because the information was provided in large part by the patient, it cannot be guaranteed that it has not been purposely or unconsciously manipulated. Every effort has been made to obtain as much relevant data as possible for this evaluation. It is important to note that the conclusions that lead to this procedure are derived in large part from the available data. Always take into account that the treatment will also be dependent on availability of resources and existing treatment guidelines, considered by other Pain Management Practitioners as being common knowledge and practice, at the time of the intervention. For Medico-Legal purposes, it is also important to point out that variation in procedural techniques and pharmacological choices are the acceptable norm. The indications, contraindications, technique, and results of the above procedure should  only be interpreted and judged by a Board-Certified Interventional Pain Specialist with extensive familiarity and expertise in the same exact procedure and technique.

## 2022-12-21 NOTE — Patient Instructions (Signed)
Pain Management Discharge Instructions  General Discharge Instructions :  If you need to reach your doctor call: Monday-Friday 8:00 am - 4:00 pm at [phone] or toll free [phone].  After clinic hours [phone] to have operator reach doctor.  Bring all of your medication bottles to all your appointments in the pain clinic.  To cancel or reschedule your appointment with Pain Management please remember to call 24 hours in advance to avoid a fee.  Refer to the educational materials which you have been given on: General Risks, I had my Procedure. Discharge Instructions, Post Sedation.  Post Procedure Instructions:  The drugs you were given will stay in your system until tomorrow, so for the next 24 hours you should not drive, make any legal decisions or drink any alcoholic beverages.  You may eat anything you prefer, but it is better to start with liquids then soups and crackers, and gradually work up to solid foods.  Please notify your doctor immediately if you have any unusual bleeding, trouble breathing or pain that is not related to your normal pain.  Depending on the type of procedure that was done, some parts of your body may feel week and/or numb.  This usually clears up by tonight or the next day.  Walk with the use of an assistive device or accompanied by an adult for the 24 hours.  You may use ice on the affected area for the first 24 hours.  Put ice in a Ziploc bag and cover with a towel and place against area 15 minutes on 15 minutes off.  You may switch to heat after 24 hours.Facet Blocks Patient Information  Description: The facets are joints in the spine between the vertebrae.  Like any joints in the body, facets can become irritated and painful.  Arthritis can also effect the facets.  By injecting steroids and local anesthetic in and around these joints, we can temporarily block the nerve supply to them.  Steroids act directly on irritated nerves and tissues to  reduce selling and inflammation which often leads to decreased pain.  Facet blocks may be done anywhere along the spine from the neck to the low back depending upon the location of your pain.   After numbing the skin with local anesthetic (like Novocaine), a small needle is passed onto the facet joints under x-ray guidance.  You may experience a sensation of pressure while this is being done.  The entire block usually lasts about 15-25 minutes.   Conditions which may be treated by facet blocks:  Low back/buttock pain Neck/shoulder pain Certain types of headaches  Preparation for the injection:  Do not eat any solid food or dairy products within 8 hours of your appointment. You may drink clear liquid up to 3 hours before appointment.  Clear liquids include water, black coffee, juice or soda.  No milk or cream please. You may take your regular medication, including pain medications, with a sip of water before your appointment.  Diabetics should hold regular insulin (if taken separately) and take 1/2 normal NPH dose the morning of the procedure.  Carry some sugar containing items with you to your appointment. A driver must accompany you and be prepared to drive you home after your procedure. Bring all your current medications with you. An IV may be inserted and sedation may be given at the discretion of the physician. A blood pressure cuff, EKG and other monitors will often be applied during the procedure.  Some patients may need to   have extra oxygen administered for a short period. You will be asked to provide medical information, including your allergies and medications, prior to the procedure.  We must know immediately if you are taking blood thinners (like Coumadin/Warfarin) or if you are allergic to IV iodine contrast (dye).  We must know if you could possible be pregnant.  Possible side-effects:  Bleeding from needle site Infection (rare, may require surgery) Nerve injury (rare) Numbness  & tingling (temporary) Difficulty urinating (rare, temporary) Spinal headache (a headache worse with upright posture) Light-headedness (temporary) Pain at injection site (serveral days) Decreased blood pressure (rare, temporary) Weakness in arm/leg (temporary) Pressure sensation in back/neck (temporary)   Call if you experience:  Fever/chills associated with headache or increased back/neck pain Headache worsened by an upright position New onset, weakness or numbness of an extremity below the injection site Hives or difficulty breathing (go to the emergency room) Inflammation or drainage at the injection site(s) Severe back/neck pain greater than usual New symptoms which are concerning to you  Please note:  Although the local anesthetic injected can often make your back or neck feel good for several hours after the injection, the pain will likely return. It takes 3-7 days for steroids to work.  You may not notice any pain relief for at least one week.  If effective, we will often do a series of 2-3 injections spaced 3-6 weeks apart to maximally decrease your pain.  After the initial series, you may be a candidate for a more permanent nerve block of the facets.  If you have any questions, please call [phone] [facility] 

## 2022-12-22 ENCOUNTER — Telehealth: Payer: Self-pay

## 2022-12-22 NOTE — Telephone Encounter (Signed)
Post procedure follow up..  Patient states he is doing good 

## 2022-12-23 DIAGNOSIS — I5020 Unspecified systolic (congestive) heart failure: Secondary | ICD-10-CM

## 2022-12-23 DIAGNOSIS — F0280 Dementia in other diseases classified elsewhere without behavioral disturbance: Secondary | ICD-10-CM

## 2022-12-23 DIAGNOSIS — G3109 Other frontotemporal dementia: Secondary | ICD-10-CM

## 2022-12-23 DIAGNOSIS — I10 Essential (primary) hypertension: Secondary | ICD-10-CM

## 2023-01-05 ENCOUNTER — Ambulatory Visit (INDEPENDENT_AMBULATORY_CARE_PROVIDER_SITE_OTHER): Payer: PPO | Admitting: Neurosurgery

## 2023-01-05 DIAGNOSIS — M47819 Spondylosis without myelopathy or radiculopathy, site unspecified: Secondary | ICD-10-CM | POA: Diagnosis not present

## 2023-01-05 NOTE — Progress Notes (Signed)
Neurosurgery Telephone (Audio-Only) Note  Requesting Provider     Venita Lick, NP [address] [place],   [address] T: [phone] F: [phone]  Primary Care Provider Venita Lick, NP [place] [address] T: [phone] F: [phone]  Telehealth visit was conducted with Christopher Huang, a 72 y.o. male via telephone. He understands the limitations of a telephone visit.   History of Present Illness: Christopher Huang is a 72 y.o male presenting today via telephone visit to review his injection response. He underwent L2,3, and 4 MBB with Dr. Holley Raring on 12/21/22.  He reports near resolution of his low back pain after these injections for about 3 to 5 days.  He states that while he has had some continued reduction in his degree of pain, his back pain does begin to return particularly towards the end of the day which requires him to put on his back brace in order to continue with activities.  He did discuss nerve ablations with Dr. Holley Raring and would like to consider this.  He continues to deny any radiating leg pain.  11/26/22 Christopher Huang is a 72 year old male presenting today via telephone visit to discuss his MRI results.  He was last seen on 10/29/2022 with complaints of increased low back pain without any radicular symptoms.  Today he reports continued low back pain without radicular pain.   10/29/22 Christopher Huang is a 72 y.o. male with a history of previous lumbar fusion,  who presents with the chief complaint of ongoing low back pain.  He states that he has really had low back pain since the surgery but over the last 3 to 4 months it is worsened.  He was recently diagnosed with a new heart condition and has stopped his anti-inflammatory medications.  He describes aching/throbbing low back pain without radiation into his buttocks or legs.  This is worse with standing and stooping activities like raking leaves and improves with laying flat.  He continues to take gabapentin  and Tylenol as needed.  He denies any radiating leg pain.   General Review of Systems:  A ROS was performed including pertinent positive and negatives as documented.  All other systems are negative.   Prior to Admission medications   Medication Sig Start Date End Date Taking? Authorizing Provider  Acetaminophen (ACETAMINOPHEN EXTRA STRENGTH) 500 MG capsule Take 1 capsule by mouth every 6 (six) hours as needed for fever.    [provider]  ASPIRIN 81 PO Take 81 mg by mouth daily.    [provider]  carvedilol (COREG) 3.125 MG tablet Take 1 tablet (3.125 mg total) by mouth 2 (two) times daily. 08/25/22 08/20/23  Minna Merritts, MD  Cholecalciferol (VITAMIN D3) 50 MCG (2000 UT) TABS Take 2,000 Units by mouth daily.    [provider]  Coenzyme Q10 100 MG TABS Take 100 mg by mouth daily.    [provider]  docusate sodium (COLACE) 100 MG capsule Take 100 mg by mouth daily.    [provider]  donepezil (ARICEPT) 5 MG tablet Take 1 tablet (5 mg total) by mouth at bedtime. 03/27/22   Cannady, Henrine Screws T, NP  fexofenadine (ALLEGRA ALLERGY) 180 MG tablet Take 1 tablet (180 mg total) by mouth daily. 12/05/21   Vigg, Avanti, MD  fluticasone (FLONASE) 50 MCG/ACT nasal spray PLACE 2 SPRAYS INTO EACH NOSTRIL DAILY. 02/17/22   Cannady, Barbaraann Faster, NP  Misc Natural Products (RELAX & SLEEP PO)  Take by mouth. Reports taking "Relaxium" nightly for sleep (contains 16m Melatonin, 1064mMagnesium, L-Tryptophan, Valerest, Ashwagandha, 1001mABA, Chamomile and Passionflower)    [provider]  Multiple Vitamin (MULTIVITAMIN WITH MINERALS) TABS tablet Take 1 tablet by mouth daily. Men's Multivitamin 50+    [provider]  rosuvastatin (CRESTOR) 40 MG tablet Take 1 tablet (40 mg total) by mouth daily. Stop taking 20 MG tablets. 03/29/22   Cannady, JolHenrine Screws NP  sacubitril-valsartan (ENTRESTO) 49-51 MG Take 1 tablet by mouth 2 (two) times daily. 11/25/22   GolMinna MerrittsD  vitamin B-12 (CYANOCOBALAMIN) 500 MCG tablet Take 500 mcg by mouth daily.    [provider]    DATA REVIEWED    Imaging Studies  No interval imaging to review   IMPRESSION  Christopher Huang a [age]o. male who I performed a telephone encounter today for evaluation and management of: Facet arthropathy with corresponding back pain  PLAN  Christopher Huang a very pleasant [age]ar old presenting today via telephone visit to discuss his response to recent facet injections.  He has had a positive response to these and would like to move forward with nerve ablations.  He has a follow-up next month with Dr. LatHolley Raringd we will discuss this with him.  We discussed that given his lack of radicular symptoms or response to facet injections, there is not currently a role for surgical intervention which he was pleased with.  He would like to reserve any sort of surgical intervention as a last resort.  Should he fail to have meaningful relief from facet ablations or began to have radicular symptoms, we will be more than happy to see him back to discuss potential surgical options.  Will need to first complete 6 weeks of physical therapy.  We will otherwise see him going forward on an as-needed basis.  He was encouraged to call the office should he have any questions or concerns.  He expressed understanding was in agreement with this plan.  No orders of the defined types were placed in this encounter.   DISPOSITION  Follow up: In person appointment in  PRN   DanLoleta DickerA   TELEPHONE DOCUMENTATION   This visit was performed via telephone.  Patient location: home Provider location: office  I spent a total of 10 minutes non-face-to-face activities for this visit on the date of this encounter including review of current clinical condition and response to treatment.  The patient is aware of and accepts the limits of this telehealth visit.

## 2023-01-15 ENCOUNTER — Ambulatory Visit (INDEPENDENT_AMBULATORY_CARE_PROVIDER_SITE_OTHER): Payer: PPO

## 2023-01-15 ENCOUNTER — Ambulatory Visit: Payer: Self-pay

## 2023-01-15 DIAGNOSIS — F0280 Dementia in other diseases classified elsewhere without behavioral disturbance: Secondary | ICD-10-CM

## 2023-01-15 DIAGNOSIS — I5020 Unspecified systolic (congestive) heart failure: Secondary | ICD-10-CM

## 2023-01-15 NOTE — Chronic Care Management (AMB) (Signed)
Chronic Care Management   CCM RN Visit Note  01/15/2023 Name: STIHL BRUSSO MRN: [id number] DOB: [birth year]  Subjective: Christopher Huang is a 72 y.o. year old male who is a primary care patient of Cannady, Barbaraann Faster, NP. The patient was referred to the Chronic Care Management team for assistance with care management needs subsequent to provider initiation of CCM services and plan of care.    Today's Visit:  Engaged with patient by telephone for follow up visit.        Goals Addressed             This Visit's Progress    CCM Expected Outcome:  Monitor, Self-Manage and Reduce Symptoms of Heart Failure       Current Barriers:  Knowledge Deficits related to the importance of daily weights in the patient with heart failure Care Coordination needs related to cost of Entresto and help with cost constraints, pharmacy consult pending in a patient with heart failure Chronic Disease Management support and education needs related to effective management of heart failure  Planned Interventions: Basic overview and discussion of pathophysiology of Heart Failure reviewed Provided education on low sodium diet. Review of monitoring for hidden sodium in foods Reviewed Heart Failure Action Plan in depth. The patient has a follow up appointment with the cardiologist in April. The patient denies any edema or swelling in his feet and legs. The patient states that his weight is stable.  Assessed need for readable accurate scales in home. Has a scale Provided education about placing scale on hard, flat surface Advised patient to weigh each morning after emptying bladder Discussed importance of daily weight and advised patient to weigh and record daily. The patients wife states they were weighing daily but are no longer doing this as his weight was staying the same. Education on the benefits of daily weights in  effective management of heart failure. Review of weight gain of +2/3 in one day or +5 in one week  and the need to notify the provider.  The patients wife states the patient has not had swelling in feet or legs. Review of the Heart Failure Society of America: FACES F-fatique A- Activity intolerance C-Chest congestion/Cough E-Edema or swelling (feet, legs, ankles, abdomen) S-Shortness of breath Reviewed role of diuretics in prevention of fluid overload and management of heart failure Discussed the importance of keeping all appointments with provider Provided patient with education about the role of exercise in the management of heart failure. Also gave information to the patients wife about the Heart Failure Society of [place] website for helpful information and heart healthy diet information  Advised patient to discuss change in heart health and heart failure  with provider Screening for signs and symptoms of depression related to chronic disease state  Assessed social determinant of health barriers EF %= 30 to 35% on 06-2022. Review of EF % and what this means The patient had a procedure for his back pain and discomfort and it worked well for him for a few weeks but now he is in pain again. He has a tens unit that he is hoping that will help. He goes back to see Dr. Holley Raring on 01-25-2023 and they are talking about an ablation. They are hopeful to find something that will work well for the patient. Reflective listening and support given.   Symptom Management: Take medications as prescribed   Attend all scheduled provider appointments Call provider office for new concerns or questions  call the Suicide  and Crisis Lifeline: 988 call the [place] National Suicide Prevention Lifeline: 365-847-3630 or TTY: 226-391-6430 TTY 779-399-6666) to talk to a trained counselor call 1-800-273-TALK (toll free, 24 hour hotline) if experiencing a Mental Health or [place]  call office if I gain more than 2 pounds in one day or 5 pounds in one week use salt in moderation watch for swelling in feet,  ankles and legs every day weigh myself daily develop a rescue plan follow rescue plan if symptoms flare-up eat more whole grains, fruits and vegetables, lean meats and healthy fats track symptoms and what helps feel better or worse dress right for the weather, hot or cold  Follow Up Plan: Telephone follow up appointment with care management team member scheduled for: 03-12-2023 at 1 pm       CCM Expected Outcome:  Monitor, Self-Manage and Reduce Symptoms of: Dementia       Current Barriers:  Knowledge Deficits related to progression of dementia and how to effectively manage changes in condition Care Coordination needs related to medication needs and questions about possible side effects from Aricept  in a patient with dementia Chronic Disease Management support and education needs related to effective management of dementia  Planned Interventions: Evaluation of current treatment plan related to dementia  and patient's adherence to plan as established by provider. The patient is stable. They had an appointment for 01-29-2023 to have more extensive testing on his neurological status but after talking to each other they have decided that they did not want to do this right now so Katharine Look has cancelled the appointment. They will follow up with Dr. Elberta Leatherwood as scheduled. They have upcoming appointments with the pcp in May. Advised patient to call the office for acute changes in memory, anxiety, depression, or mood Provided education to patient re: talking to the provider about other medications effective for management of dementia. The patients wife is concerned that the Aricept may be causing headaches for the patient Reviewed medications with patient and discussed compliance and Aricept with possible side effects. Pharmacy consult for medication management and questions Provided patient with resources available as dementia progression takes place educational materials related to effective management of  dementia, patient is stable at this time. The patient remains stable at this time. Education and support given.  Reviewed scheduled/upcoming provider appointments including 03-30-2023 at 840 am with pcp. The patients wife has a call into the neurologist for information related to Aricept and possible side effects Pharmacy referral for medication reconciliation, management, and cost constraints Discussed plans with patient for ongoing care management follow up and provided patient with direct contact information for care management team Advised patient to discuss changes in mood, or mentation, acute onset of memory decline, fall and safety concerns with provider Screening for signs and symptoms of depression related to chronic disease state  Assessed social determinant of health barriers The patient has completed his colonoscopy and they did find some polyps. The polyps were removed and he will have a repeat colonoscopy in 3 years.  Symptom Management: Take medications as prescribed   Attend all scheduled provider appointments Call provider office for new concerns or questions  call the Suicide and Crisis Lifeline: 988 call the [place] National Suicide Prevention Lifeline: 330 709 1368 or TTY: 7190055690 TTY 518-686-4054) to talk to a trained counselor call 1-800-273-TALK (toll free, 24 hour hotline) if experiencing a Mental Health or [place]   Follow Up Plan: Telephone follow up appointment with care management team member scheduled  for: 03-12-2023 at 1 pm          Plan:Telephone follow up appointment with care management team member scheduled for:  03-12-2023 at 1 pm  [place], MSN, [place] Management Direct Number: [phone]

## 2023-01-15 NOTE — Chronic Care Management (AMB) (Signed)
Duplicate note, error.

## 2023-01-15 NOTE — Patient Instructions (Signed)
Please call the care guide team at [phone] if you need to cancel or reschedule your appointment.   If you are experiencing a Mental Health or [place] or need someone to talk to, please call the Suicide and Crisis Lifeline: 988 call the [place] National Suicide Prevention Lifeline: 902-290-1220 or TTY: 514-450-0412 TTY 315-071-7406) to talk to a trained counselor call 1-800-273-TALK (toll free, 24 hour hotline)   Following is a copy of the CCM Program Consent:  CCM service includes personalized support from designated clinical staff supervised by the physician, including individualized plan of care and coordination with other care providers 24/7 contact phone numbers for assistance for urgent and routine care needs. Service will only be billed when office clinical staff spend 20 minutes or more in a month to coordinate care. Only one practitioner may furnish and bill the service in a calendar month. The patient may stop CCM services at amy time (effective at the end of the month) by phone call to the office staff. The patient will be responsible for cost sharing (co-pay) or up to 20% of the service fee (after annual deductible is met)  Following is a copy of your full provider care plan:   Goals Addressed             This Visit's Progress    CCM Expected Outcome:  Monitor, Self-Manage and Reduce Symptoms of Heart Failure       Current Barriers:  Knowledge Deficits related to the importance of daily weights in the patient with heart failure Care Coordination needs related to cost of Entresto and help with cost constraints, pharmacy consult pending in a patient with heart failure Chronic Disease Management support and education needs related to effective management of heart failure  Planned Interventions: Basic overview and discussion of pathophysiology of Heart Failure reviewed Provided education on low sodium diet. Review of monitoring for hidden sodium in  foods Reviewed Heart Failure Action Plan in depth. The patient has a follow up appointment with the cardiologist in April. The patient denies any edema or swelling in his feet and legs. The patient states that his weight is stable.  Assessed need for readable accurate scales in home. Has a scale Provided education about placing scale on hard, flat surface Advised patient to weigh each morning after emptying bladder Discussed importance of daily weight and advised patient to weigh and record daily. The patients wife states they were weighing daily but are no longer doing this as his weight was staying the same. Education on the benefits of daily weights in  effective management of heart failure. Review of weight gain of +2/3 in one day or +5 in one week and the need to notify the provider.  The patients wife states the patient has not had swelling in feet or legs. Review of the Heart Failure Society of America: FACES F-fatique A- Activity intolerance C-Chest congestion/Cough E-Edema or swelling (feet, legs, ankles, abdomen) S-Shortness of breath Reviewed role of diuretics in prevention of fluid overload and management of heart failure Discussed the importance of keeping all appointments with provider Provided patient with education about the role of exercise in the management of heart failure. Also gave information to the patients wife about the Heart Failure Society of [place] website for helpful information and heart healthy diet information  Advised patient to discuss change in heart health and heart failure  with provider Screening for signs and symptoms of depression related to chronic disease state  Assessed social determinant of  health barriers EF %= 30 to 35% on 06-2022. Review of EF % and what this means The patient had a procedure for his back pain and discomfort and it worked well for him for a few weeks but now he is in pain again. He has a tens unit that he is hoping that will help. He  goes back to see Dr. Holley Raring on 01-25-2023 and they are talking about an ablation. They are hopeful to find something that will work well for the patient. Reflective listening and support given.   Symptom Management: Take medications as prescribed   Attend all scheduled provider appointments Call provider office for new concerns or questions  call the Suicide and Crisis Lifeline: 988 call the [place] National Suicide Prevention Lifeline: 208-179-6851 or TTY: (302)331-2338 TTY 646-685-7185) to talk to a trained counselor call 1-800-273-TALK (toll free, 24 hour hotline) if experiencing a Mental Health or [place]  call office if I gain more than 2 pounds in one day or 5 pounds in one week use salt in moderation watch for swelling in feet, ankles and legs every day weigh myself daily develop a rescue plan follow rescue plan if symptoms flare-up eat more whole grains, fruits and vegetables, lean meats and healthy fats track symptoms and what helps feel better or worse dress right for the weather, hot or cold  Follow Up Plan: Telephone follow up appointment with care management team member scheduled for: 03-12-2023 at 1 pm       CCM Expected Outcome:  Monitor, Self-Manage and Reduce Symptoms of: Dementia       Current Barriers:  Knowledge Deficits related to progression of dementia and how to effectively manage changes in condition Care Coordination needs related to medication needs and questions about possible side effects from Aricept  in a patient with dementia Chronic Disease Management support and education needs related to effective management of dementia  Planned Interventions: Evaluation of current treatment plan related to dementia  and patient's adherence to plan as established by provider. The patient is stable. They had an appointment for 01-29-2023 to have more extensive testing on his neurological status but after talking to each other they have decided that they did  not want to do this right now so Katharine Look has cancelled the appointment. They will follow up with Dr. Elberta Leatherwood as scheduled. They have upcoming appointments with the pcp in May. Advised patient to call the office for acute changes in memory, anxiety, depression, or mood Provided education to patient re: talking to the provider about other medications effective for management of dementia. The patients wife is concerned that the Aricept may be causing headaches for the patient Reviewed medications with patient and discussed compliance and Aricept with possible side effects. Pharmacy consult for medication management and questions Provided patient with resources available as dementia progression takes place educational materials related to effective management of dementia, patient is stable at this time. The patient remains stable at this time. Education and support given.  Reviewed scheduled/upcoming provider appointments including 03-30-2023 at 840 am with pcp. The patients wife has a call into the neurologist for information related to Aricept and possible side effects Pharmacy referral for medication reconciliation, management, and cost constraints Discussed plans with patient for ongoing care management follow up and provided patient with direct contact information for care management team Advised patient to discuss changes in mood, or mentation, acute onset of memory decline, fall and safety concerns with provider Screening for signs and symptoms of depression  related to chronic disease state  Assessed social determinant of health barriers The patient has completed his colonoscopy and they did find some polyps. The polyps were removed and he will have a repeat colonoscopy in 3 years.  Symptom Management: Take medications as prescribed   Attend all scheduled provider appointments Call provider office for new concerns or questions  call the Suicide and Crisis Lifeline: 988 call the [place] National Suicide  Prevention Lifeline: (959) 562-0257 or TTY: (734)392-4430 TTY 747-792-4341) to talk to a trained counselor call 1-800-273-TALK (toll free, 24 hour hotline) if experiencing a Mental Health or [place]   Follow Up Plan: Telephone follow up appointment with care management team member scheduled for: 03-12-2023 at 1 pm          Patient verbalizes understanding of instructions and care plan provided today and agrees to view in [place]. Active MyChart status and patient understanding of how to access instructions and care plan via MyChart confirmed with patient.     Telephone follow up appointment with care management team member scheduled for:  03-12-2023 at 1 pm

## 2023-01-18 ENCOUNTER — Ambulatory Visit: Payer: PPO | Admitting: Student in an Organized Health Care Education/Training Program

## 2023-01-19 DIAGNOSIS — D225 Melanocytic nevi of trunk: Secondary | ICD-10-CM | POA: Diagnosis not present

## 2023-01-19 DIAGNOSIS — D0462 Carcinoma in situ of skin of left upper limb, including shoulder: Secondary | ICD-10-CM | POA: Diagnosis not present

## 2023-01-19 DIAGNOSIS — D2261 Melanocytic nevi of right upper limb, including shoulder: Secondary | ICD-10-CM | POA: Diagnosis not present

## 2023-01-19 DIAGNOSIS — D2272 Melanocytic nevi of left lower limb, including hip: Secondary | ICD-10-CM | POA: Diagnosis not present

## 2023-01-19 DIAGNOSIS — Z85828 Personal history of other malignant neoplasm of skin: Secondary | ICD-10-CM | POA: Diagnosis not present

## 2023-01-19 DIAGNOSIS — D2262 Melanocytic nevi of left upper limb, including shoulder: Secondary | ICD-10-CM | POA: Diagnosis not present

## 2023-01-19 DIAGNOSIS — D485 Neoplasm of uncertain behavior of skin: Secondary | ICD-10-CM | POA: Diagnosis not present

## 2023-01-19 DIAGNOSIS — L570 Actinic keratosis: Secondary | ICD-10-CM | POA: Diagnosis not present

## 2023-01-19 DIAGNOSIS — L821 Other seborrheic keratosis: Secondary | ICD-10-CM | POA: Diagnosis not present

## 2023-01-21 ENCOUNTER — Telehealth: Payer: Self-pay | Admitting: Nurse Practitioner

## 2023-01-21 DIAGNOSIS — F0280 Dementia in other diseases classified elsewhere without behavioral disturbance: Secondary | ICD-10-CM | POA: Diagnosis not present

## 2023-01-21 DIAGNOSIS — I5020 Unspecified systolic (congestive) heart failure: Secondary | ICD-10-CM | POA: Diagnosis not present

## 2023-01-21 NOTE — Telephone Encounter (Signed)
[place] to schedule their annual wellness visit. Appointment made for 02/15/2023.  Sherol Dade; Care Guide [facility] Direct Dial: [phone]

## 2023-01-25 ENCOUNTER — Ambulatory Visit
Payer: PPO | Attending: Student in an Organized Health Care Education/Training Program | Admitting: Student in an Organized Health Care Education/Training Program

## 2023-01-25 ENCOUNTER — Encounter: Payer: Self-pay | Admitting: Student in an Organized Health Care Education/Training Program

## 2023-01-25 VITALS — BP 125/73 | HR 72 | Temp 97.5°F | Resp 18 | Ht 73.0 in | Wt 228.0 lb

## 2023-01-25 DIAGNOSIS — M48061 Spinal stenosis, lumbar region without neurogenic claudication: Secondary | ICD-10-CM | POA: Diagnosis not present

## 2023-01-25 DIAGNOSIS — M5136 Other intervertebral disc degeneration, lumbar region: Secondary | ICD-10-CM | POA: Insufficient documentation

## 2023-01-25 DIAGNOSIS — M47816 Spondylosis without myelopathy or radiculopathy, lumbar region: Secondary | ICD-10-CM | POA: Diagnosis not present

## 2023-01-25 NOTE — Progress Notes (Signed)
PROVIDER NOTE: Information contained herein reflects review and annotations entered in association with encounter. Interpretation of such information and data should be left to medically-trained personnel. Information provided to patient can be located elsewhere in the medical record under "Patient Instructions". Document created using STT-dictation technology, any transcriptional errors that may result from process are unintentional.    Patient: Christopher Huang  Service Category: E/M  Provider: Gillis Santa, MD  DOB: 21-Dec-1950  DOS: 01/25/2023  Referring Provider: Venita Lick, NP  MRN: [id number]  Specialty: Interventional Pain Management  PCP: Venita Lick, NP  Type: Established Patient  Setting: Ambulatory outpatient    Location: Office  Delivery: Face-to-face     HPI  [name], a 72 y.o. year old male, is here today because of his Lumbar facet arthropathy [M47.816]. [name] primary complain today is Back Pain Last encounter: My last encounter with him was on 12/21/2022. Pertinent problems: [name] has Lumbar facet arthropathy; Spinal stenosis, lumbar region, without neurogenic claudication (L3/4); and Lumbar degenerative disc disease on their pertinent problem list. Pain Assessment: Severity of Chronic pain is reported as a 4 /10. Location: Back Lower/denies. Onset: More than a month ago. Quality: Stabbing. Timing: Intermittent. Modifying factor(s): compression belt. Vitals:  height is '6\' 1"'$  (1.854 m) and weight is 228 lb (103.4 kg). His temperature is 97.5 F (36.4 C) (abnormal). His blood pressure is 125/73 and his pulse is 72. His respiration is 18 and oxygen saturation is 99%.  BMI: Estimated body mass index is 30.08 kg/m as calculated from the following:   Height as of this encounter: '6\' 1"'$  (1.854 m).   Weight as of this encounter: 228 lb (103.4 kg).  Reason for encounter: post-procedure evaluation and assessment.   Post-procedure evaluation   Type: Lumbar  Facet, Medial Branch Block(s) #1  Laterality: Bilateral  Level: L2, L3, and L4 Medial Branch Level(s). Injecting these levels blocks the L2-3 and L3-4 lumbar facet joints. Imaging: Fluoroscopic guidance         Anesthesia: Local anesthesia (1-2% Lidocaine) Anxiolysis: Oral Valium 5 mg DOS: 12/21/2022 Performed by: Gillis Santa, MD  Primary Purpose: Diagnostic/Therapeutic Indications: Low back pain severe enough to impact quality of life or function. 1. Lumbar facet arthropathy   2. Lumbar degenerative disc disease    NAS-11 Pain score:   Pre-procedure: 2 /10   Post-procedure: 2 /10      Effectiveness:  Initial hour after procedure: 100 %  Subsequent 4-6 hours post-procedure: 100 %  Analgesia past initial 6 hours: 80% for 3 days then decreased to 60% for 3 days and back to baseline after 1 week Ongoing improvement:  Analgesic:  0% Function: Back to baseline ROM: Back to baseline    ROS  Constitutional: Denies any fever or chills Gastrointestinal: No reported hemesis, hematochezia, vomiting, or acute GI distress Musculoskeletal:  + low back pain Neurological: No reported episodes of acute onset apraxia, aphasia, dysarthria, agnosia, amnesia, paralysis, loss of coordination, or loss of consciousness  Medication Review  Acetaminophen Extra Strength, Aspirin, Cholecalciferol, Coenzyme Q10, Misc Natural Products, carvedilol, cyanocobalamin, docusate sodium, donepezil, fexofenadine, fluticasone, multivitamin with minerals, rosuvastatin, and sacubitril-valsartan  History Review  Allergy: [name] is allergic to percocet [oxycodone-acetaminophen], tramadol, and vicodin [hydrocodone-acetaminophen]. Drug: [name]  reports no history of drug use. Alcohol:  reports no history of alcohol use. Tobacco:  reports that he has never smoked. He has never used smokeless tobacco. Social: [name]  reports that he has never smoked. He  has never used smokeless tobacco. He reports that he  does not drink alcohol and does not use drugs. Medical:  has a past medical history of Allergic rhinitis, Cancer of prostate ([place]) (02/2007), Dementia ([place]), History of kidney stones, Hyperlipidemia, Hypertension, and Personal history of kidney stones. Surgical: [name]  has a past surgical history that includes prostate cancer removed; Tonsillectomy; Knee arthroscopy; basal skin cancers; Tonsillectomy; Colonoscopy with propofol (N/A, 11/09/2017); Hand surgery; Total knee arthroplasty (Left, 02/09/2020); Eye surgery (Bilateral); Anterior lateral lumbar fusion with percutaneous screw 1 level (N/A, 01/29/2021); Cataract extraction, bilateral; and Colonoscopy with propofol (N/A, 12/01/2022). Family: family history includes Dementia in his mother; Prostate cancer in his father.  Laboratory Chemistry Profile   Renal Lab Results  Component Value Date   BUN 15 10/28/2022   CREATININE 0.84 10/28/2022   BCR 18 10/28/2022   GFRAA 103 08/16/2020   GFRNONAA >60 12/03/2020    Hepatic Lab Results  Component Value Date   AST 27 10/28/2022   ALT 30 10/28/2022   ALBUMIN 4.5 10/28/2022   ALKPHOS 88 10/28/2022    Electrolytes Lab Results  Component Value Date   NA 141 10/28/2022   K 4.4 10/28/2022   CL 102 10/28/2022   CALCIUM 9.3 10/28/2022    Bone Lab Results  Component Value Date   VD25OH 33.0 10/28/2022    Inflammation (CRP: Acute Phase) (ESR: Chronic Phase) No results found for: "CRP", "ESRSEDRATE", "LATICACIDVEN"       Note: Above Lab results reviewed.  Physical Exam  General appearance: Well nourished, well developed, and well hydrated. In no apparent acute distress Mental status: Alert, oriented x 3 (person, place, & time)       Respiratory: No evidence of acute respiratory distress Eyes: PERLA Vitals: BP 125/73   Pulse 72   Temp (!) 97.5 F (36.4 C)   Resp 18   Ht '6\' 1"'$  (1.854 m)   Wt 228 lb (103.4 kg)   SpO2 99%   BMI 30.08 kg/m  BMI: Estimated body mass index is 30.08  kg/m as calculated from the following:   Height as of this encounter: '6\' 1"'$  (1.854 m).   Weight as of this encounter: 228 lb (103.4 kg). Ideal: Ideal body weight: 79.9 kg (176 lb 2.4 oz) Adjusted ideal body weight: 89.3 kg (196 lb 14.2 oz)  Lumbar Spine Area Exam  Skin & Axial Inspection: Well healed scar from previous spine surgery detected Alignment: Symmetrical Functional ROM: Unrestricted ROM       Stability: No instability detected Muscle Tone/Strength: Functionally intact. No obvious neuro-muscular anomalies detected. Sensory (Neurological): Musculoskeletal pain pattern Palpation: No palpable anomalies       Provocative Tests: Hyperextension/rotation test: (+) bilaterally for facet joint pain. Lumbar quadrant test (Kemp's test): (+) bilaterally for facet joint pain. Lateral bending test: Unable to perform due to fusion restriction.  Low back pain, worse with lumbar extension and facet loading  Assessment   Diagnosis Status  1. Lumbar facet arthropathy   2. Lumbar degenerative disc disease   3. Spinal stenosis, lumbar region, without neurogenic claudication (L3/4)    Responding Persistent Persistent     Plan of Care  Positive response to first set of diagnostic lumbar facet medial branch nerve blocks.  Discussed repeating diagnostic nerve block #2 and then considering lumbar radiofrequency ablation.  Risk and benefits reviewed and patient would like to proceed.  We also had a discussion regarding spinal cord stimulation for postlaminectomy pain syndrome.  Orders:  Orders Placed This  Encounter  Procedures   LUMBAR FACET(MEDIAL BRANCH NERVE BLOCK) MBNB    Standing Status:   Future    Standing Expiration Date:   04/27/2023    Scheduling Instructions:     Procedure: Lumbar facet block (AKA.: Lumbosacral medial branch nerve block) #2     Side: Bilateral     Level: L2-3, L3-4,Facets (L2, L3, L4,Medial Branch)     Sedation: PO Valium     Timeframe: ASAA    Order  Specific Question:   Where will this procedure be performed?    Answer:   [facility]   Follow-up plan:   Return in about 1 week (around 02/01/2023) for B/L L2, 3, 4 , in clinic (PO Valium).     Recent Visits Date Type Provider Dept  12/21/22 Procedure visit Gillis Santa, MD [facility]  12/07/22 Office Visit Gillis Santa, MD [facility]  Showing recent visits within past 90 days and meeting all other requirements Today's Visits Date Type Provider Dept  01/25/23 Office Visit Gillis Santa, MD [facility]  Showing today's visits and meeting all other requirements Future Appointments Date Type Provider Dept  02/17/23 Appointment Gillis Santa, MD [facility]  Showing future appointments within next 90 days and meeting all other requirements  I discussed the assessment and treatment plan with the patient. The patient was provided an opportunity to ask questions and all were answered. The patient agreed with the plan and demonstrated an understanding of the instructions.  Patient advised to call back or seek an in-person evaluation if the symptoms or condition worsens.  Duration of encounter: 44mnutes.  Total time on encounter, as per AMA guidelines included both the face-to-face and non-face-to-face time personally spent by the physician and/or other qualified health care professional(s) on the day of the encounter (includes time in activities that require the physician or other qualified health care professional and does not include time in activities normally performed by clinical staff). Physician's time may include the following activities when performed: Preparing to see the patient (e.g., pre-charting review of records, searching for previously ordered imaging, lab work, and nerve conduction tests) Review of prior analgesic pharmacotherapies. Reviewing PMP Interpreting ordered tests (e.g., lab work, imaging, nerve conduction  tests) Performing post-procedure evaluations, including interpretation of diagnostic procedures Obtaining and/or reviewing separately obtained history Performing a medically appropriate examination and/or evaluation Counseling and educating the patient/family/caregiver Ordering medications, tests, or procedures Referring and communicating with other health care professionals (when not separately reported) Documenting clinical information in the electronic or other health record Independently interpreting results (not separately reported) and communicating results to the patient/ family/caregiver Care coordination (not separately reported)  Note by: BGillis Santa MD Date: 01/25/2023; Time: 3:07 PM

## 2023-01-25 NOTE — Progress Notes (Signed)
Safety precautions to be maintained throughout the outpatient stay will include: orient to surroundings, keep bed in low position, maintain call bell within reach at all times, provide assistance with transfer out of bed and ambulation.  

## 2023-01-25 NOTE — Patient Instructions (Signed)
______________________________________________________________________  Preparing for your procedure  Appointments: If you think you may not be able to keep your appointment, call 24-48 hours in advance to cancel. We need time to make it available to others.  During your procedure appointment there will be: No Prescription Refills. No disability issues to discussed. No medication changes or discussions.  Instructions: Food intake: Avoid eating anything solid for at least 8 hours prior to your procedure. Clear liquid intake: You may take clear liquids such as water up to 2 hours prior to your procedure. (No carbonated drinks. No soda.) Transportation: Unless otherwise stated by your physician, bring a driver. Morning Medicines: Except for blood thinners, take all of your other morning medications with a sip of water. Make sure to take your heart and blood pressure medicines. If your blood pressure's lower number is above 100, the case will be rescheduled. Blood thinners: Make sure to stop your blood thinners as instructed.  If you take a blood thinner, but were not instructed to stop it, call our office [phone] and ask to talk to a nurse. Not stopping a blood thinner prior to certain procedures could lead to serious complications. Diabetics on insulin: Notify the staff so that you can be scheduled 1st case in the morning. If your diabetes requires high dose insulin, take only  of your normal insulin dose the morning of the procedure and notify the staff that you have done so. Preventing infections: Shower with an antibacterial soap the morning of your procedure.  Build-up your immune system: Take 1000 mg of Vitamin C with every meal (3 times a day) the day prior to your procedure. Antibiotics: Inform the nursing staff if you are taking any antibiotics or if you have any conditions that may require antibiotics prior to procedures. (Example: recent joint implants)   Pregnancy: If you are  pregnant make sure to notify the nursing staff. Not doing so may result in injury to the fetus, including death.  Sickness: If you have a cold, fever, or any active infections, call and cancel or reschedule your procedure. Receiving steroids while having an infection may result in complications. Arrival: You must be in the facility at least 30 minutes prior to your scheduled procedure. Tardiness: Your scheduled time is also the cutoff time. If you do not arrive at least 15 minutes prior to your procedure, you will be rescheduled.  Children: Do not bring any children with you. Make arrangements to keep them home. Dress appropriately: There is always a possibility that your clothing may get soiled. Avoid long dresses. Valuables: Do not bring any jewelry or valuables.  Reasons to call and reschedule or cancel your procedure: (Following these recommendations will minimize the risk of a serious complication.) Surgeries: Avoid having procedures within 2 weeks of any surgery. (Avoid for 2 weeks before or after any surgery). Flu Shots: Avoid having procedures within 2 weeks of a flu shots or . (Avoid for 2 weeks before or after immunizations). Barium: Avoid having a procedure within 7-10 days after having had a radiological study involving the use of radiological contrast. (Myelograms, Barium swallow or enema study). Heart attacks: Avoid any elective procedures or surgeries for the initial 6 months after a "Myocardial Infarction" (Heart Attack). Blood thinners: It is imperative that you stop these medications before procedures. Let us know if you if you take any blood thinner.  Infection: Avoid procedures during or within two weeks of an infection (including chest colds or gastrointestinal problems). Symptoms associated with infections  include: Localized redness, fever, chills, night sweats or profuse sweating, burning sensation when voiding, cough, congestion, stuffiness, runny nose, sore throat, diarrhea,  nausea, vomiting, cold or Flu symptoms, recent or current infections. It is specially important if the infection is over the area that we intend to treat. Heart and lung problems: Symptoms that may suggest an active cardiopulmonary problem include: cough, chest pain, breathing difficulties or shortness of breath, dizziness, ankle swelling, uncontrolled high or unusually low blood pressure, and/or palpitations. If you are experiencing any of these symptoms, cancel your procedure and contact your primary care physician for an evaluation.  Remember:  Regular Business hours are:  Monday to Thursday 8:00 AM to 4:00 PM  Provider's Schedule: Milinda Pointer, MD:  Procedure days: Tuesday and Thursday 7:30 AM to 4:00 PM  Gillis Santa, MD:  Procedure days: Monday and Wednesday 7:30 AM to 4:00 PM  ______________________________________________________________________

## 2023-01-29 ENCOUNTER — Encounter: Payer: PPO | Admitting: Psychology

## 2023-02-09 ENCOUNTER — Encounter: Payer: PPO | Admitting: Psychology

## 2023-02-09 ENCOUNTER — Telehealth: Payer: Self-pay | Admitting: Nurse Practitioner

## 2023-02-09 NOTE — Telephone Encounter (Signed)
[facility] to schedule their annual wellness visit. Appointment made for 03/15/2023.  Sherol Dade; Care Guide [facility] Direct Dial: [phone]

## 2023-02-17 ENCOUNTER — Encounter: Payer: Self-pay | Admitting: Student in an Organized Health Care Education/Training Program

## 2023-02-17 ENCOUNTER — Ambulatory Visit
Payer: PPO | Attending: Student in an Organized Health Care Education/Training Program | Admitting: Student in an Organized Health Care Education/Training Program

## 2023-02-17 ENCOUNTER — Ambulatory Visit
Admission: RE | Admit: 2023-02-17 | Discharge: 2023-02-17 | Disposition: A | Discharge status: Home / Self Care | Payer: PPO | Source: Ambulatory Visit | Attending: Student in an Organized Health Care Education/Training Program | Admitting: Student in an Organized Health Care Education/Training Program

## 2023-02-17 VITALS — BP 136/67 | HR 41 | Temp 97.5°F | Resp 16 | Ht 73.0 in | Wt 228.0 lb

## 2023-02-17 DIAGNOSIS — M5136 Other intervertebral disc degeneration, lumbar region: Secondary | ICD-10-CM | POA: Insufficient documentation

## 2023-02-17 DIAGNOSIS — M47816 Spondylosis without myelopathy or radiculopathy, lumbar region: Secondary | ICD-10-CM | POA: Diagnosis not present

## 2023-02-17 MED ORDER — DIAZEPAM 5 MG PO TABS
5.0000 mg | ORAL_TABLET | ORAL | Status: AC
  Administered 2023-02-17: 5 mg via ORAL

## 2023-02-17 MED ORDER — DEXAMETHASONE SODIUM PHOSPHATE 10 MG/ML IJ SOLN
10.0000 mg | Freq: Once | INTRAMUSCULAR | Status: AC
  Administered 2023-02-17: 10 mg
  Filled 2023-02-17: qty 1

## 2023-02-17 MED ORDER — DIAZEPAM 5 MG PO TABS
ORAL_TABLET | ORAL | Status: AC
  Filled 2023-02-17: qty 1

## 2023-02-17 MED ORDER — LIDOCAINE HCL 2 % IJ SOLN
20.0000 mL | Freq: Once | INTRAMUSCULAR | Status: AC
  Administered 2023-02-17: 400 mg
  Filled 2023-02-17: qty 40

## 2023-02-17 MED ORDER — ROPIVACAINE HCL 2 MG/ML IJ SOLN
9.0000 mL | Freq: Once | INTRAMUSCULAR | Status: AC
  Administered 2023-02-17: 9 mL via PERINEURAL
  Filled 2023-02-17: qty 20

## 2023-02-17 MED ORDER — ROPIVACAINE HCL 2 MG/ML IJ SOLN
9.0000 mL | Freq: Once | INTRAMUSCULAR | Status: AC
  Administered 2023-02-17: 9 mL via PERINEURAL

## 2023-02-17 NOTE — Addendum Note (Signed)
Addended by: Leotis Shames F on: 02/17/2023 10:25 AM   Modules accepted: Orders

## 2023-02-17 NOTE — Progress Notes (Signed)
Safety precautions to be maintained throughout the outpatient stay will include: orient to surroundings, keep bed in low position, maintain call bell within reach at all times, provide assistance with transfer out of bed and ambulation.  

## 2023-02-17 NOTE — Patient Instructions (Signed)
Facet Blocks °Patient Information ° °Description: The facets are joints in the spine between the vertebrae.  Like any joints in the body, facets can become irritated and painful.  Arthritis can also effect the facets.  By injecting steroids and local anesthetic in and around these joints, we can temporarily block the nerve supply to them.  Steroids act directly on irritated nerves and tissues to reduce selling and inflammation which often leads to decreased pain.  Facet blocks may be done anywhere along the spine from the neck to the low back depending upon the location of your pain. °  After numbing the skin with local anesthetic (like Novocaine), a small needle is passed onto the facet joints under x-ray guidance.  You may experience a sensation of pressure while this is being done.  The entire block usually lasts about 15-25 minutes.  ° °Conditions which may be treated by facet blocks: ° °Low back/buttock pain °Neck/shoulder pain °Certain types of headaches ° °Preparation for the injection: ° °Do not eat any solid food or dairy products within 8 hours of your appointment. °You may drink clear liquid up to 3 hours before appointment.  Clear liquids include water, black coffee, juice or soda.  No milk or cream please. °You may take your regular medication, including pain medications, with a sip of water before your appointment.  Diabetics should hold regular insulin (if taken separately) and take 1/2 normal NPH dose the morning of the procedure.  Carry some sugar containing items with you to your appointment. °A driver must accompany you and be prepared to drive you home after your procedure. °Bring all your current medications with you. °An IV may be inserted and sedation may be given at the discretion of the physician. °A blood pressure cuff, EKG and other monitors will often be applied during the procedure.  Some patients may need to have extra oxygen administered for a short period. °You will be asked to  provide medical information, including your allergies and medications, prior to the procedure.  We must know immediately if you are taking blood thinners (like Coumadin/Warfarin) or if you are allergic to IV iodine contrast (dye).  We must know if you could possible be pregnant. ° °Possible side-effects: ° °Bleeding from needle site °Infection (rare, may require surgery) °Nerve injury (rare) °Numbness & tingling (temporary) °Difficulty urinating (rare, temporary) °Spinal headache (a headache worse with upright posture) °Light-headedness (temporary) °Pain at injection site (serveral days) °Decreased blood pressure (rare, temporary) °Weakness in arm/leg (temporary) °Pressure sensation in back/neck (temporary) ° ° °Call if you experience: ° °Fever/chills associated with headache or increased back/neck pain °Headache worsened by an upright position °New onset, weakness or numbness of an extremity below the injection site °Hives or difficulty breathing (go to the emergency room) °Inflammation or drainage at the injection site(s) °Severe back/neck pain greater than usual °New symptoms which are concerning to you ° °Please note: ° °Although the local anesthetic injected can often make your back or neck feel good for several hours after the injection, the pain will likely return. It takes 3-7 days for steroids to work.  You may not notice any pain relief for at least one week. ° °If effective, we will often do a series of 2-3 injections spaced 3-6 weeks apart to maximally decrease your pain.  After the initial series, you may be a candidate for a more permanent nerve block of the facets. ° °If you have any questions, please call [phone] °[facility]   Pain ClinicPain Management Discharge Instructions ° °General Discharge Instructions : ° °If you need to reach your doctor call: Monday-Friday 8:00 am - 4:00 pm at [phone] or toll free [phone].  After clinic hours [phone] to have  operator reach doctor. ° °Bring all of your medication bottles to all your appointments in the pain clinic. ° °To cancel or reschedule your appointment with Pain Management please remember to call 24 hours in advance to avoid a fee. ° °Refer to the educational materials which you have been given on: General Risks, I had my Procedure. Discharge Instructions, Post Sedation. ° °Post Procedure Instructions: ° °The drugs you were given will stay in your system until tomorrow, so for the next 24 hours you should not drive, make any legal decisions or drink any alcoholic beverages. ° °You may eat anything you prefer, but it is better to start with liquids then soups and crackers, and gradually work up to solid foods. ° °Please notify your doctor immediately if you have any unusual bleeding, trouble breathing or pain that is not related to your normal pain. ° °Depending on the type of procedure that was done, some parts of your body may feel week and/or numb.  This usually clears up by tonight or the next day. ° °Walk with the use of an assistive device or accompanied by an adult for the 24 hours. ° °You may use ice on the affected area for the first 24 hours.  Put ice in a Ziploc bag and cover with a towel and place against area 15 minutes on 15 minutes off.  You may switch to heat after 24 hours.Facet Blocks °Patient Information ° °Description: The facets are joints in the spine between the vertebrae.  Like any joints in the body, facets can become irritated and painful.  Arthritis can also effect the facets.  By injecting steroids and local anesthetic in and around these joints, we can temporarily block the nerve supply to them.  Steroids act directly on irritated nerves and tissues to reduce selling and inflammation which often leads to decreased pain.  Facet blocks may be done anywhere along the spine from the neck to the low back depending upon the location of your pain. °  After numbing the skin with local anesthetic  (like Novocaine), a small needle is passed onto the facet joints under x-ray guidance.  You may experience a sensation of pressure while this is being done.  The entire block usually lasts about 15-25 minutes.  ° °Conditions which may be treated by facet blocks: ° °Low back/buttock pain °Neck/shoulder pain °Certain types of headaches ° °Preparation for the injection: ° °Do not eat any solid food or dairy products within 8 hours of your appointment. °You may drink clear liquid up to 3 hours before appointment.  Clear liquids include water, black coffee, juice or soda.  No milk or cream please. °You may take your regular medication, including pain medications, with a sip of water before your appointment.  Diabetics should hold regular insulin (if taken separately) and take 1/2 normal NPH dose the morning of the procedure.  Carry some sugar containing items with you to your appointment. °A driver must accompany you and be prepared to drive you home after your procedure. °Bring all your current medications with you. °An IV may be inserted and sedation may be given at the discretion of the physician. °A blood pressure cuff, EKG and other monitors will often be applied during the procedure.  Some patients may   need to have extra oxygen administered for a short period. °You will be asked to provide medical information, including your allergies and medications, prior to the procedure.  We must know immediately if you are taking blood thinners (like Coumadin/Warfarin) or if you are allergic to IV iodine contrast (dye).  We must know if you could possible be pregnant. ° °Possible side-effects: ° °Bleeding from needle site °Infection (rare, may require surgery) °Nerve injury (rare) °Numbness & tingling (temporary) °Difficulty urinating (rare, temporary) °Spinal headache (a headache worse with upright posture) °Light-headedness (temporary) °Pain at injection site (serveral days) °Decreased blood pressure (rare,  temporary) °Weakness in arm/leg (temporary) °Pressure sensation in back/neck (temporary) ° ° °Call if you experience: ° °Fever/chills associated with headache or increased back/neck pain °Headache worsened by an upright position °New onset, weakness or numbness of an extremity below the injection site °Hives or difficulty breathing (go to the emergency room) °Inflammation or drainage at the injection site(s) °Severe back/neck pain greater than usual °New symptoms which are concerning to you ° °Please note: ° °Although the local anesthetic injected can often make your back or neck feel good for several hours after the injection, the pain will likely return. It takes 3-7 days for steroids to work.  You may not notice any pain relief for at least one week. ° °If effective, we will often do a series of 2-3 injections spaced 3-6 weeks apart to maximally decrease your pain.  After the initial series, you may be a candidate for a more permanent nerve block of the facets. ° °If you have any questions, please call [phone] °[facility] °

## 2023-02-17 NOTE — Progress Notes (Signed)
PROVIDER NOTE: Interpretation of information contained herein should be left to medically-trained personnel. Specific patient instructions are provided elsewhere under "Patient Instructions" section of medical record. This document was created in part using STT-dictation technology, any transcriptional errors that may result from this process are unintentional.  Patient: Christopher Huang Type: Established DOB: 12/14/1950 MRN: [id number] PCP: Venita Lick, NP  Service: Procedure DOS: 02/17/2023 Setting: Ambulatory Location: Ambulatory outpatient facility Delivery: Face-to-face Provider: Gillis Santa, MD Specialty: Interventional Pain Management Specialty designation: 09 Location: Outpatient facility Ref. Prov.: Cannady, Barbaraann Faster, NP    Procedure:           Type: Lumbar Facet, Medial Branch Block(s) #2  Laterality: Bilateral  Level: L2, L3, and L4 Medial Branch Level(s). Injecting these levels blocks the L2-3 and L3-4 lumbar facet joints. Imaging: Fluoroscopic guidance         Anesthesia: Local anesthesia (1-2% Lidocaine) Anxiolysis: Oral Valium 5 mg DOS: 02/17/2023 Performed by: Gillis Santa, MD  Primary Purpose: Diagnostic/Therapeutic Indications: Low back pain severe enough to impact quality of life or function. 1. Lumbar facet arthropathy   2. Lumbar degenerative disc disease    NAS-11 Pain score:   Pre-procedure: 2 /10   Post-procedure: 2 /10     Position / Prep / Materials:  Position: Prone  Prep solution: DuraPrep (Iodine Povacrylex [0.7% available iodine] and Isopropyl Alcohol, 74% w/w) Area Prepped: Posterolateral Lumbosacral Spine (Wide prep: From the lower border of the scapula down to the end of the tailbone and from flank to flank.)  Materials:  Tray: Block Needle(s):  Type: Spinal  Gauge (G): 22  Length: 3.5-in   Pre-op H&P Assessment:  Christopher Huang is a 72 y.o. (year old), male patient, seen today for interventional treatment. He  has a past surgical  history that includes prostate cancer removed; Tonsillectomy; Knee arthroscopy; basal skin cancers; Tonsillectomy; Colonoscopy with propofol (N/A, 11/09/2017); Hand surgery; Total knee arthroplasty (Left, 02/09/2020); Eye surgery (Bilateral); Anterior lateral lumbar fusion with percutaneous screw 1 level (N/A, 01/29/2021); Cataract extraction, bilateral; and Colonoscopy with propofol (N/A, 12/01/2022). Christopher Huang has a current medication list which includes the following prescription(s): acetaminophen extra strength, aspirin, carvedilol, vitamin d3, coenzyme q10, docusate sodium, donepezil, fexofenadine, fluticasone, misc natural products, multivitamin with minerals, rosuvastatin, sacubitril-valsartan, and cyanocobalamin. His primarily concern today is the Back Pain (lower)  Initial Vital Signs:  Pulse/HCG Rate: (!) 41ECG Heart Rate: 60 Temp: (!) 97.5 F (36.4 C) Resp: 18 BP: (!) 153/59 SpO2: 100 %  BMI: Estimated body mass index is 30.08 kg/m as calculated from the following:   Height as of this encounter: 6\' 1"  (1.854 m).   Weight as of this encounter: 228 lb (103.4 kg).  Risk Assessment: Allergies: Reviewed. He is allergic to percocet [oxycodone-acetaminophen], tramadol, and vicodin [hydrocodone-acetaminophen].  Allergy Precautions: None required Coagulopathies: Reviewed. None identified.  Blood-thinner therapy: None at this time Active Infection(s): Reviewed. None identified. Christopher Huang is afebrile  Site Confirmation: Christopher Huang was asked to confirm the procedure and laterality before marking the site Procedure checklist: Completed Consent: Before the procedure and under the influence of no sedative(s), amnesic(s), or anxiolytics, the patient was informed of the treatment options, risks and possible complications. To fulfill our ethical and legal obligations, as recommended by the American Medical Association's Code of Ethics, I have informed the patient of my clinical impression; the nature  and purpose of the treatment or procedure; the risks, benefits, and possible complications of the intervention; the alternatives, including doing nothing; the risk(s)  and benefit(s) of the alternative treatment(s) or procedure(s); and the risk(s) and benefit(s) of doing nothing. The patient was provided information about the general risks and possible complications associated with the procedure. These may include, but are not limited to: failure to achieve desired goals, infection, bleeding, organ or nerve damage, allergic reactions, paralysis, and death. In addition, the patient was informed of those risks and complications associated to Spine-related procedures, such as failure to decrease pain; infection (i.e.: Meningitis, epidural or intraspinal abscess); bleeding (i.e.: epidural hematoma, subarachnoid hemorrhage, or any other type of intraspinal or peri-dural bleeding); organ or nerve damage (i.e.: Any type of peripheral nerve, nerve root, or spinal cord injury) with subsequent damage to sensory, motor, and/or autonomic systems, resulting in permanent pain, numbness, and/or weakness of one or several areas of the body; allergic reactions; (i.e.: anaphylactic reaction); and/or death. Furthermore, the patient was informed of those risks and complications associated with the medications. These include, but are not limited to: allergic reactions (i.e.: anaphylactic or anaphylactoid reaction(s)); adrenal axis suppression; blood sugar elevation that in diabetics may result in ketoacidosis or comma; water retention that in patients with history of congestive heart failure may result in shortness of breath, pulmonary edema, and decompensation with resultant heart failure; weight gain; swelling or edema; medication-induced neural toxicity; particulate matter embolism and blood vessel occlusion with resultant organ, and/or nervous system infarction; and/or aseptic necrosis of one or more joints. Finally, the patient  was informed that Medicine is not an exact science; therefore, there is also the possibility of unforeseen or unpredictable risks and/or possible complications that may result in a catastrophic outcome. The patient indicated having understood very clearly. We have given the patient no guarantees and we have made no promises. Enough time was given to the patient to ask questions, all of which were answered to the patient's satisfaction. Christopher Huang has indicated that he wanted to continue with the procedure. Attestation: I, the ordering provider, attest that I have discussed with the patient the benefits, risks, side-effects, alternatives, likelihood of achieving goals, and potential problems during recovery for the procedure that I have provided informed consent. Date  Time: 02/17/2023  8:38 AM  Pre-Procedure Preparation:  Monitoring: As per clinic protocol. Respiration, ETCO2, SpO2, BP, heart rate and rhythm monitor placed and checked for adequate function Safety Precautions: Patient was assessed for positional comfort and pressure points before starting the procedure. Time-out: I initiated and conducted the "Time-out" before starting the procedure, as per protocol. The patient was asked to participate by confirming the accuracy of the "Time Out" information. Verification of the correct person, site, and procedure were performed and confirmed by me, the nursing staff, and the patient. "Time-out" conducted as per Joint Commission's Universal Protocol (UP.01.01.01). Time: 0912  Description of Procedure:          Laterality: (see above) Targeted Levels: (see above)  Safety Precautions: Aspiration looking for blood return was conducted prior to all injections. At no point did we inject any substances, as a needle was being advanced. Before injecting, the patient was told to immediately notify me if he was experiencing any new onset of "ringing in the ears, or metallic taste in the mouth". No attempts were  made at seeking any paresthesias. Safe injection practices and needle disposal techniques used. Medications properly checked for expiration dates. SDV (single dose vial) medications used. After the completion of the procedure, all disposable equipment used was discarded in the proper designated medical waste containers. Local Anesthesia: Protocol guidelines were  followed. The patient was positioned over the fluoroscopy table. The area was prepped in the usual manner. The time-out was completed. The target area was identified using fluoroscopy. A 12-in long, straight, sterile hemostat was used with fluoroscopic guidance to locate the targets for each level blocked. Once located, the skin was marked with an approved surgical skin marker. Once all sites were marked, the skin (epidermis, dermis, and hypodermis), as well as deeper tissues (fat, connective tissue and muscle) were infiltrated with a small amount of a short-acting local anesthetic, loaded on a 10cc syringe with a 25G, 1.5-in  Needle. An appropriate amount of time was allowed for local anesthetics to take effect before proceeding to the next step. Local Anesthetic: Lidocaine 2.0% The unused portion of the local anesthetic was discarded in the proper designated containers. Technical description of process:  L2 Medial Branch Nerve Block (MBB): The target area for the L2 medial branch is at the junction of the postero-lateral aspect of the superior articular process and the superior, posterior, and medial edge of the transverse process of L3. Under fluoroscopic guidance, a Quincke needle was inserted until contact was made with os over the superior postero-lateral aspect of the pedicular shadow (target area). After negative aspiration for blood, 59mL of the nerve block solution was injected without difficulty or complication. The needle was removed intact. L3 Medial Branch Nerve Block (MBB): The target area for the L3 medial branch is at the junction of the  postero-lateral aspect of the superior articular process and the superior, posterior, and medial edge of the transverse process of L4. Under fluoroscopic guidance, a Quincke needle was inserted until contact was made with os over the superior postero-lateral aspect of the pedicular shadow (target area). After negative aspiration for blood,2 mL of the nerve block solution was injected without difficulty or complication. The needle was removed intact. L4 Medial Branch Nerve Block (MBB): The target area for the L4 medial branch is at the junction of the postero-lateral aspect of the superior articular process and the superior, posterior, and medial edge of the transverse process of L5. Under fluoroscopic guidance, a Quincke needle was inserted until contact was made with os over the superior postero-lateral aspect of the pedicular shadow (target area). After negative aspiration for blood, 2 mL of the nerve block solution was injected without difficulty or complication. The needle was removed intact.   12 cc solution made of 10 cc of 0.2% ropivacaine, 2 cc of Decadron 10 mg/cc.  2 cc injected at each level above bilaterally.   Once the entire procedure was completed, the treated area was cleaned, making sure to leave some of the prepping solution back to take advantage of its long term bactericidal properties.         Illustration of the posterior view of the lumbar spine and the posterior neural structures. Laminae of L2 through S1 are labeled. DPRL5, dorsal primary ramus of L5; DPRS1, dorsal primary ramus of S1; DPR3, dorsal primary ramus of L3; FJ, facet (zygapophyseal) joint L3-L4; I, inferior articular process of L4; LB1, lateral branch of dorsal primary ramus of L1; IAB, inferior articular branches from L3 medial branch (supplies L4-L5 facet joint); IBP, intermediate branch plexus; MB3, medial branch of dorsal primary ramus of L3; NR3, third lumbar nerve root; S, superior articular process of L5; SAB,  superior articular branches from L4 (supplies L4-5 facet joint also); TP3, transverse process of L3.  Vitals:   02/17/23 [id number] 02/17/23 [id number] 02/17/23 0922 02/17/23 0926  BP:  115/66 139/64 130/63 136/67  Pulse:      Resp: 18 15 18 16   Temp:      TempSrc:      SpO2: 96% 97% 96% 96%  Weight:      Height:         Start Time: 0912 hrs. End Time: 0926 hrs.  Imaging Guidance (Spinal):          Type of Imaging Technique: Fluoroscopy Guidance (Spinal) Indication(s): Assistance in needle guidance and placement for procedures requiring needle placement in or near specific anatomical locations not easily accessible without such assistance. Exposure Time: Please see nurses notes. Contrast: None used. Fluoroscopic Guidance: I was personally present during the use of fluoroscopy. "Tunnel Vision Technique" used to obtain the best possible view of the target area. Parallax error corrected before commencing the procedure. "Direction-depth-direction" technique used to introduce the needle under continuous pulsed fluoroscopy. Once target was reached, antero-posterior, oblique, and lateral fluoroscopic projection used confirm needle placement in all planes. Images permanently stored in EMR. Interpretation: No contrast injected. I personally interpreted the imaging intraoperatively. Adequate needle placement confirmed in multiple planes. Permanent images saved into the patient's record.  Post-operative Assessment:  Post-procedure Vital Signs:  Pulse/HCG Rate: (!) 41(!) 59 Temp: (!) 97.5 F (36.4 C) Resp: 16 BP: 136/67 SpO2: 96 %  EBL: None  Complications: No immediate post-treatment complications observed by team, or reported by patient.  Note: The patient tolerated the entire procedure well. A repeat set of vitals were taken after the procedure and the patient was kept under observation following institutional policy, for this type of procedure. Post-procedural neurological assessment was performed,  showing return to baseline, prior to discharge. The patient was provided with post-procedure discharge instructions, including a section on how to identify potential problems. Should any problems arise concerning this procedure, the patient was given instructions to immediately contact us, at any time, without hesitation. In any case, we plan to contact the patient by telephone for a follow-up status report regarding this interventional procedure.  Comments:  No additional relevant information.  Plan of Care  Orders:  Orders Placed This Encounter  Procedures   [facility] C-ARM 1-60 MIN NO REPORT    Intraoperative interpretation by procedural physician at [facility].    Standing Status:   Standing    Number of Occurrences:   1    Order Specific Question:   Reason for exam:    Answer:   Assistance in needle guidance and placement for procedures requiring needle placement in or near specific anatomical locations not easily accessible without such assistance.     Medications ordered for procedure: Meds ordered this encounter  Medications   lidocaine (XYLOCAINE) 2 % (with pres) injection 400 mg   dexamethasone (DECADRON) injection 10 mg   dexamethasone (DECADRON) injection 10 mg   ropivacaine (PF) 2 mg/mL (0.2%) (NAROPIN) injection 9 mL   ropivacaine (PF) 2 mg/mL (0.2%) (NAROPIN) injection 9 mL   Medications administered: We administered lidocaine, dexamethasone, dexamethasone, ropivacaine (PF) 2 mg/mL (0.2%), and ropivacaine (PF) 2 mg/mL (0.2%).  See the medical record for exact dosing, route, and time of administration.  Follow-up plan:   Return in about 4 weeks (around 03/17/2023) for Post Procedure Evaluation, in person.      Recent Visits Date Type Provider Dept  01/25/23 Office Visit Gillis Santa, MD [facility]  12/21/22 Procedure visit Gillis Santa, MD [facility]  12/07/22 Office Visit Gillis Santa, MD [facility]  Showing  recent visits within past 90 days and meeting all other requirements Today's Visits Date Type Provider Dept  02/17/23 Procedure visit Gillis Santa, MD [facility]  Showing today's visits and meeting all other requirements Future Appointments Date Type Provider Dept  03/22/23 Appointment Gillis Santa, MD [facility]  Showing future appointments within next 90 days and meeting all other requirements  Disposition: Discharge home  Discharge (Date  Time): 02/17/2023; 0935 hrs.   Primary Care Physician: Venita Lick, NP Location: [facility] Note by: Gillis Santa, MD Date: 02/17/2023; Time: 10:13 AM  Disclaimer:  Medicine is not an exact science. The only guarantee in medicine is that nothing is guaranteed. It is important to note that the decision to proceed with this intervention was based on the information collected from the patient. The Data and conclusions were drawn from the patient's questionnaire, the interview, and the physical examination. Because the information was provided in large part by the patient, it cannot be guaranteed that it has not been purposely or unconsciously manipulated. Every effort has been made to obtain as much relevant data as possible for this evaluation. It is important to note that the conclusions that lead to this procedure are derived in large part from the available data. Always take into account that the treatment will also be dependent on availability of resources and existing treatment guidelines, considered by other Pain Management Practitioners as being common knowledge and practice, at the time of the intervention. For Medico-Legal purposes, it is also important to point out that variation in procedural techniques and pharmacological choices are the acceptable norm. The indications, contraindications, technique, and results of the above procedure should only be interpreted and judged by a Board-Certified  Interventional Pain Specialist with extensive familiarity and expertise in the same exact procedure and technique.

## 2023-02-18 ENCOUNTER — Telehealth: Payer: Self-pay | Admitting: *Deleted

## 2023-02-18 NOTE — Telephone Encounter (Signed)
Spoke with patient's wife, no problems post procedure. 

## 2023-02-28 NOTE — Progress Notes (Unsigned)
Cardiology Office Note  Date:  03/01/2023   ID:  Christopher Huang, DOB Sep 03, 1951, MRN [id number]  PCP:  Marjie Skiff, NP   Chief Complaint  Patient presents with   3 month follow up     "Doing well." Medications reviewed by the patient verbally.     HPI:  Christopher Huang is a 72 yo male with PMH of  Memory loss HTN Hyperlipidemia Left bundle branch block Cardiomyopathy, ejection fraction 45 to 50% Who presents for follow-up of his sinus bradycardia with LBBB, ejection fraction 30 to 35% in 8/23, NICM  Last seen in clinic Jan 2024 In follow-up today reports he is active, denies significant chest pain or shortness of breath No significant leg swelling, PND orthopnea Walks, goes to the gym, couple times a week with his wife, uses the eliptical  No orthostasis BP low today Otherwise tolerating carvedilol and Entresto  Continues on Aricept for memory  EKG personally reviewed by myself on todays visit Normal sinus rhythm rate 65 bpm PVCs left bundle branch block  Cardiac CTA 1.  Coronary calcium score of 0. 2.  Normal coronary origin with right dominance. 3. Nonobstructive CAD with mixed plaque in proximal LAD causing minimal (0-24%) stenosis 4.  Mild aortic valve calcifications (AV calcium score 224) 5.  Mild mitral annular calcifications 6.  Dilated ascending aorta measuring 48mm  Echo 8/23 1. Left ventricular ejection fraction, by estimation, is 30 to 35%. The  left ventricle has moderate to severely decreased function. The left  ventricle demonstrates global hypokinesis. The left ventricular internal  cavity size was mildly dilated. Left  ventricular diastolic parameters are consistent with Grade I diastolic  dysfunction (impaired relaxation).   2. Right ventricular systolic function is low normal. The right  ventricular size is normal.   3. The mitral valve is normal in structure. No evidence of mitral valve  regurgitation.   4. The aortic valve is tricuspid.  Aortic valve regurgitation is mild.  Aortic valve sclerosis is present, with no evidence of aortic valve  stenosis.   Stress test ordered May 19, 2022 mildly depressed ejection fraction 45 to 50%, no significant ischemia noted Calcification noted in left main, aortic atherosclerosis  Echocardiogram  Left ventricular ejection fraction, by estimation, is 30 to 35%.   Prior imaging reviewed MR brain 1. Asymmetric left temporal lobe atrophy, question symptoms of semantic dementia. 2. No reversible finding or evidence of recent injury.  Carotid ultrasound Mild carotid disease bilaterally   PMH:   has a past medical history of Allergic rhinitis, Cancer of prostate (02/2007), Dementia, History of kidney stones, Hyperlipidemia, Hypertension, and Personal history of kidney stones.  PSH:    Past Surgical History:  Procedure Laterality Date   ANTERIOR LATERAL LUMBAR FUSION WITH PERCUTANEOUS SCREW 1 LEVEL N/A 01/29/2021   Procedure: L4-5 LATERAL INTERBODY FUSION, L4-5 POSTERIOR FUSION;  Surgeon: Venetia Night, MD;  Location: [facility] ORS;  Service: Neurosurgery;  Laterality: N/A;   basal skin cancers     CATARACT EXTRACTION, BILATERAL     2021   COLONOSCOPY WITH PROPOFOL N/A 11/09/2017   Procedure: COLONOSCOPY WITH PROPOFOL;  Surgeon: Midge Minium, MD;  Location: [facility] ENDOSCOPY;  Service: Endoscopy;  Laterality: N/A;   COLONOSCOPY WITH PROPOFOL N/A 12/01/2022   Procedure: COLONOSCOPY WITH PROPOFOL;  Surgeon: Midge Minium, MD;  Location: [facility] ENDOSCOPY;  Service: Endoscopy;  Laterality: N/A;   EYE SURGERY Bilateral    Cataract   HAND SURGERY     THUMB SURGERY  KNEE ARTHROSCOPY     prostate cancer removed     TONSILLECTOMY     TONSILLECTOMY     TOTAL KNEE ARTHROPLASTY Left 02/09/2020   Procedure: TOTAL KNEE ARTHROPLASTY - RNFA;  Surgeon: Kennedy BuckerMenz, Michael, MD;  Location: [facility] ORS;  Service: Orthopedics;  Laterality: Left;    Current Outpatient Medications  Medication Sig Dispense Refill    Acetaminophen (ACETAMINOPHEN EXTRA STRENGTH) 500 MG capsule Take 1 capsule by mouth every 6 (six) hours as needed for fever.     ASPIRIN 81 PO Take 81 mg by mouth daily.     carvedilol (COREG) 3.125 MG tablet Take 1 tablet (3.125 mg total) by mouth 2 (two) times daily. 180 tablet 3   Cholecalciferol (VITAMIN D3) 50 MCG (2000 UT) TABS Take 2,000 Units by mouth daily.     Coenzyme Q10 100 MG TABS Take 100 mg by mouth daily.     docusate sodium (COLACE) 100 MG capsule Take 100 mg by mouth daily.     donepezil (ARICEPT) 5 MG tablet Take 1 tablet (5 mg total) by mouth at bedtime. 90 tablet 4   fexofenadine (ALLEGRA ALLERGY) 180 MG tablet Take 1 tablet (180 mg total) by mouth daily. 10 tablet 1   fluticasone (FLONASE) 50 MCG/ACT nasal spray PLACE 2 SPRAYS INTO EACH NOSTRIL DAILY. 48 mL 3   Misc Natural Products (RELAX & SLEEP PO) Take by mouth. Reports taking "Relaxium" nightly for sleep (contains 5mg  Melatonin, 100mg  Magnesium, L-Tryptophan, Valerest, Ashwagandha, 100mg  GABA, Chamomile and Passionflower)     Multiple Vitamin (MULTIVITAMIN WITH MINERALS) TABS tablet Take 1 tablet by mouth daily. Men's Multivitamin 50+     rosuvastatin (CRESTOR) 40 MG tablet Take 1 tablet (40 mg total) by mouth daily. Stop taking 20 MG tablets. 90 tablet 4   sacubitril-valsartan (ENTRESTO) 49-51 MG Take 1 tablet by mouth 2 (two) times daily. 60 tablet 6   vitamin B-12 (CYANOCOBALAMIN) 500 MCG tablet Take 500 mcg by mouth daily.     No current facility-administered medications for this visit.    Allergies:   Percocet [oxycodone-acetaminophen], Tramadol, and Vicodin [hydrocodone-acetaminophen]   Social History:  The patient  reports that he has never smoked. He has never used smokeless tobacco. He reports that he does not drink alcohol and does not use drugs.   Family History:   family history includes Dementia in his mother; Prostate cancer in his father.    Review of Systems: Review of Systems  Constitutional:  Negative.   HENT: Negative.    Respiratory: Negative.    Cardiovascular: Negative.   Gastrointestinal: Negative.   Musculoskeletal:  Positive for back pain and joint pain.  Neurological: Negative.   Psychiatric/Behavioral: Negative.    All other systems reviewed and are negative.   PHYSICAL EXAM: VS:  BP 100/60 (BP Location: Left Arm, Patient Position: Sitting, Cuff Size: Normal)   Pulse 65   Ht 6\' 1"  (1.854 m)   Wt 225 lb 4 oz (102.2 kg)   SpO2 96%   BMI 29.72 kg/m  , BMI Body mass index is 29.72 kg/m. Constitutional:  oriented to person, place, and time. No distress.  HENT:  Head: Grossly normal Eyes:  no discharge. No scleral icterus.  Neck: No JVD, no carotid bruits  Cardiovascular: Regular rate and rhythm, no murmurs appreciated Pulmonary/Chest: Clear to auscultation bilaterally, no wheezes or rails Abdominal: Soft.  no distension.  no tenderness.  Musculoskeletal: Normal range of motion Neurological:  normal muscle tone. Coordination normal. No atrophy Skin: Skin  warm and dry Psychiatric: normal affect, pleasant  Recent Labs: 03/27/2022: TSH 1.820 10/28/2022: ALT 30; BUN 15; Creatinine, Ser 0.84; Hemoglobin 15.1; Platelets 209; Potassium 4.4; Sodium 141    Lipid Panel Lab Results  Component Value Date   CHOL 131 10/28/2022   HDL 55 10/28/2022   LDLCALC 53 10/28/2022   TRIG 132 10/28/2022      Wt Readings from Last 3 Encounters:  03/01/23 225 lb 4 oz (102.2 kg)  02/17/23 228 lb (103.4 kg)  01/25/23 228 lb (103.4 kg)     ASSESSMENT AND PLAN:  Problem List Items Addressed This Visit       Cardiology Problems   Hyperlipidemia   Relevant Orders   EKG 12-Lead   Hypertension   LBBB (left bundle branch block)   Relevant Orders   EKG 12-Lead   Other Visit Diagnoses     Coronary artery disease of native artery of native heart with stable angina pectoris    -  Primary   Relevant Orders   EKG 12-Lead   Dilated cardiomyopathy       Relevant Orders    EKG 12-Lead   Angina pectoris         Dilated cardiomyopathy  Nonischemic cardiomyopathy, cardiac CTA with no significant stenosis Ejection fraction 30 to 35% on echo We have recommended he continue carvedilol 3.125 twice daily, Entresto up to 49/51 mg twice daily Given low blood pressure, unable to add spironolactone or advance Entresto We will add Jardiance 10 mg daily Consider repeat echocardiogram in several months time  Hyperlipidemia On Crestor 40,  Cholesterol at goal  Essential hypertension  blood pressure low today, asymptomatic  Carotid disease Minimal nonobstructive disease on ultrasound Aggressive lipid management recommended  Memory loss Reports on Aricept,  Recommended regular walking program daily   Total encounter time more than 30 minutes  Greater than 50% was spent in counseling and coordination of care with the patient    Signed, Dossie Arbour, M.D., Ph.D. [facility] [place], [place] [phone]

## 2023-03-01 ENCOUNTER — Ambulatory Visit: Payer: PPO | Attending: Cardiovascular Disease | Admitting: Cardiovascular Disease

## 2023-03-01 ENCOUNTER — Encounter: Payer: Self-pay | Admitting: Cardiovascular Disease

## 2023-03-01 VITALS — BP 100/60 | HR 65 | Ht 73.0 in | Wt 225.2 lb

## 2023-03-01 DIAGNOSIS — I10 Essential (primary) hypertension: Secondary | ICD-10-CM | POA: Diagnosis not present

## 2023-03-01 DIAGNOSIS — I447 Left bundle-branch block, unspecified: Secondary | ICD-10-CM | POA: Diagnosis not present

## 2023-03-01 DIAGNOSIS — I25118 Atherosclerotic heart disease of native coronary artery with other forms of angina pectoris: Secondary | ICD-10-CM

## 2023-03-01 DIAGNOSIS — I420 Dilated cardiomyopathy: Secondary | ICD-10-CM | POA: Diagnosis not present

## 2023-03-01 DIAGNOSIS — E782 Mixed hyperlipidemia: Secondary | ICD-10-CM

## 2023-03-01 DIAGNOSIS — I209 Angina pectoris, unspecified: Secondary | ICD-10-CM

## 2023-03-01 MED ORDER — EMPAGLIFLOZIN 10 MG PO TABS: 10.0000 mg | ORAL_TABLET | Freq: Every day | ORAL | 3 refills | Status: DC

## 2023-03-01 NOTE — Addendum Note (Signed)
Addended by: Parke Poisson on: 03/01/2023 01:12 PM   Modules accepted: Orders

## 2023-03-01 NOTE — Patient Instructions (Signed)
Medication Instructions:  Please start jardiance 10 mg daily (samples)  If you need a refill on your cardiac medications before your next appointment, please call your pharmacy.   Lab work: No new labs needed  Testing/Procedures: No new testing needed  Follow-Up: At [facility], you and your health needs are our priority.  As part of our continuing mission to provide you with exceptional heart care, we have created designated Provider Care Teams.  These Care Teams include your primary Cardiologist (physician) and Advanced Practice Providers (APPs -  Physician Assistants and Nurse Practitioners) who all work together to provide you with the care you need, when you need it.  You will need a follow up appointment in 3 months  Providers on your designated Care Team:   Nicolasa Ducking, NP Eula Listen, PA-C Cadence Fransico Michael, [place]  COVID-19 Vaccine Information can be found at: [email or web address] For questions related to vaccine distribution or appointments, please email [email or web address] or call [phone].

## 2023-03-08 DIAGNOSIS — D0462 Carcinoma in situ of skin of left upper limb, including shoulder: Secondary | ICD-10-CM | POA: Diagnosis not present

## 2023-03-12 ENCOUNTER — Telehealth: Payer: PPO

## 2023-03-12 ENCOUNTER — Ambulatory Visit (INDEPENDENT_AMBULATORY_CARE_PROVIDER_SITE_OTHER): Payer: PPO

## 2023-03-12 DIAGNOSIS — I5020 Unspecified systolic (congestive) heart failure: Secondary | ICD-10-CM

## 2023-03-12 DIAGNOSIS — F0280 Dementia in other diseases classified elsewhere without behavioral disturbance: Secondary | ICD-10-CM

## 2023-03-12 DIAGNOSIS — I10 Essential (primary) hypertension: Secondary | ICD-10-CM

## 2023-03-12 NOTE — Patient Instructions (Signed)
Please call the care guide team at [phone] if you need to cancel or reschedule your appointment.   If you are experiencing a Mental Health or Behavioral Health Crisis or need someone to talk to, please call the Suicide and Crisis Lifeline: 988 call the [place] National Suicide Prevention Lifeline: 4162139427 or TTY: 804-831-9147 TTY [phone]) to talk to a trained counselor call 1-800-273-TALK (toll free, 24 hour hotline)   Following is a copy of the CCM Program Consent:  CCM service includes personalized support from designated clinical staff supervised by the physician, including individualized plan of care and coordination with other care providers 24/7 contact phone numbers for assistance for urgent and routine care needs. Service will only be billed when office clinical staff spend 20 minutes or more in a month to coordinate care. Only one practitioner may furnish and bill the service in a calendar month. The patient may stop CCM services at amy time (effective at the end of the month) by phone call to the office staff. The patient will be responsible for cost sharing (co-pay) or up to 20% of the service fee (after annual deductible is met)  Following is a copy of your full provider care plan:   Goals Addressed             This Visit's Progress    CCM Expected Outcome:  Monitor, Self-Manage and Reduce Symptoms of Heart Failure       Current Barriers:  Knowledge Deficits related to the importance of daily weights in the patient with heart failure Care Coordination needs related to cost of Entresto and help with cost constraints, pharmacy consult pending in a patient with heart failure Chronic Disease Management support and education needs related to effective management of heart failure Wt Readings from Last 3 Encounters:  03/01/23 225 lb 4 oz (102.2 kg)  02/17/23 228 lb (103.4 kg)  01/25/23 228 lb (103.4 kg)     Planned Interventions: Basic overview and discussion of  pathophysiology of Heart Failure reviewed. The patient saw the cardiologist recently and the patient and his wife said they got a good report. Denies any edema or swelling in feet or legs and abdomen. The patients weight is staying close to the same.  Provided education on low sodium diet. Review of monitoring for hidden sodium in foods Reviewed Heart Failure Action Plan in depth. The patient denies any edema or swelling in his feet and legs. The patient states that his weight is stable.  Assessed need for readable accurate scales in home. Has a scale Provided education about placing scale on hard, flat surface Advised patient to weigh each morning after emptying bladder Discussed importance of daily weight and advised patient to weigh and record daily. The patients wife states they were weighing daily but are no longer doing this as his weight was staying the same. Education on the benefits of daily weights in  effective management of heart failure. Review of weight gain of +2/3 in one day or +5 in one week and the need to notify the provider.  The patients wife states the patient has not had swelling in feet or legs. Review of the Heart Failure Society of [place]: FACES F-fatique A- Activity intolerance C-Chest congestion/Cough E-Edema or swelling (feet, legs, ankles, abdomen) S-Shortness of breath Reviewed role of diuretics in prevention of fluid overload and management of heart failure. The cardiologist at recent visit added Jardiance 10 mg QD to his medication regimen. Education give on the benefits of Jardiance for kidney  protection and helping decrease the risk of heart attack and stroke. Verbalized understanding.  Discussed the importance of keeping all appointments with provider Provided patient with education about the role of exercise in the management of heart failure. Also gave information to the patients wife about the Heart Failure Society of [place] website for helpful information and  heart healthy diet information  Advised patient to discuss change in heart health and heart failure  with provider Screening for signs and symptoms of depression related to chronic disease state  Assessed social determinant of health barriers EF %= 30 to 35% on 06-2022. Review of EF % and what this means The patient had a procedure for his back pain and discomfort and it worked well for him for a few weeks but now he is in pain again. He has a tens unit that he is hoping that will help. He goes back to see Dr. Cherylann Ratel soon and they are talking about an ablation. They are hopeful to find something that will work well for the patient. Reflective listening and support given.   Symptom Management: Take medications as prescribed   Attend all scheduled provider appointments Call provider office for new concerns or questions  call the Suicide and Crisis Lifeline: 988 call the [place] National Suicide Prevention Lifeline: (403)317-8192 or TTY: 262-250-3085 TTY 4311172648) to talk to a trained counselor call 1-800-273-TALK (toll free, 24 hour hotline) if experiencing a Mental Health or Behavioral Health Crisis  call office if I gain more than 2 pounds in one day or 5 pounds in one week use salt in moderation watch for swelling in feet, ankles and legs every day weigh myself daily develop a rescue plan follow rescue plan if symptoms flare-up eat more whole grains, fruits and vegetables, lean meats and healthy fats track symptoms and what helps feel better or worse dress right for the weather, hot or cold  Follow Up Plan: Telephone follow up appointment with care management team member scheduled for: 04-30-2023 at 1 pm       CCM Expected Outcome:  Monitor, Self-Manage and Reduce Symptoms of: Dementia       Current Barriers:  Knowledge Deficits related to progression of dementia and how to effectively manage changes in condition Care Coordination needs related to medication needs and questions about  possible side effects from Aricept  in a patient with dementia Chronic Disease Management support and education needs related to effective management of dementia  Planned Interventions: Evaluation of current treatment plan related to dementia  and patient's adherence to plan as established by provider. The patient is stable. They will follow up with Dr. Jerrel Ivory as scheduled. They have upcoming appointments with the pcp in May. The patient and his wife state the patient is stable. The patient has had no acute changes in his memory or decline in memory. The patient is being seen by pain specialist and the last injection helps with his pain and discomfort. They will go back soon and see the specialist and possibly get an ablation to help with his chronic back pain.  Advised patient to call the office for acute changes in memory, anxiety, depression, or mood Provided education to patient re: talking to the provider about other medications effective for management of dementia. The patients wife is concerned that the Aricept may be causing headaches for the patient Reviewed medications with patient and discussed compliance and Aricept with possible side effects. Pharmacy consult for medication management and questions Provided patient with resources available as  dementia progression takes place educational materials related to effective management of dementia, patient is stable at this time. The patient remains stable at this time. Education and support given.  Reviewed scheduled/upcoming provider appointments including 03-30-2023 at 840 am with pcp.  Pharmacy referral for medication reconciliation, management, and cost constraints Discussed plans with patient for ongoing care management follow up and provided patient with direct contact information for care management team Advised patient to discuss changes in mood, or mentation, acute onset of memory decline, fall and safety concerns with provider Screening for  signs and symptoms of depression related to chronic disease state  Assessed social determinant of health barriers The patient has completed his colonoscopy and they did find some polyps. The polyps were removed and he will have a repeat colonoscopy in 3 years.  Symptom Management: Take medications as prescribed   Attend all scheduled provider appointments Call provider office for new concerns or questions  call the Suicide and Crisis Lifeline: 988 call the [place] National Suicide Prevention Lifeline: 7471544793 or TTY: 575-819-2152 TTY 507-292-9007) to talk to a trained counselor call 1-800-273-TALK (toll free, 24 hour hotline) if experiencing a Mental Health or Behavioral Health Crisis   Follow Up Plan: Telephone follow up appointment with care management team member scheduled for: 04-30-2023 at 1 pm       CCM Expected Outcome:  Monitor, Self-Manage, and Reduce Symptoms of Hypertension       Current Barriers:  Knowledge Deficits related to the importance of taking blood pressures when the patient is having changes in sx and sx of elevated blood pressures  Chronic Disease Management support and education needs related to effective management of HTN BP Readings from Last 3 Encounters:  03/01/23 100/60  02/17/23 136/67  01/25/23 125/73     Planned Interventions: Evaluation of current treatment plan related to hypertension self management and patient's adherence to plan as established by provider. The patient has elevation in blood pressures at times especially when going to the providers office. The patient has had more stable blood pressures recently. Saw the cardiologist on 03-01-2023. Denies any acute changes in his blood pressures ;   Provided education to patient re: stroke prevention, s/s of heart attack and stroke; Reviewed prescribed diet heart healthy diet. Review and education done Reviewed medications with patient and discussed importance of compliance. Is compliant with  medications. At cardiologist visit they added Jardiance 10 mg QD to his medication regimen. Review of this helping decrease his risk of heart attack and stroke and providing kidney protection as well. Has worked with pharm D in the past. Knows to call for any new needs, education, and support with effective management of medications;  Discussed plans with patient for ongoing care management follow up and provided patient with direct contact information for care management team; Advised patient, providing education and rationale, to monitor blood pressure daily and record, calling PCP for findings outside established parameters. Per the patients wife they have not been taking his blood pressure on a regular basis. Review of the benefits of taking blood pressures on a regular basis and especially when he may feel different or have a headache;  Reviewed scheduled/upcoming provider appointments including: 03-30-2023 at 0840 am with the pcp. Saw cardiologist on 03-01-2023 Advised patient to discuss changes in blood pressures, sporadic headaches, and changes in heart health with provider; Provided education on prescribed diet heart healthy diet;  Discussed complications of poorly controlled blood pressure such as heart disease, stroke, circulatory complications, vision complications, kidney  impairment, sexual dysfunction;  Screening for signs and symptoms of depression related to chronic disease state;  Assessed social determinant of health barriers;   Symptom Management: Take medications as prescribed   Attend all scheduled provider appointments Call provider office for new concerns or questions  call the Suicide and Crisis Lifeline: 988 call the [place] National Suicide Prevention Lifeline: 337-117-8540 or TTY: (629) 205-4237 TTY 540-155-0802) to talk to a trained counselor call 1-800-273-TALK (toll free, 24 hour hotline) if experiencing a Mental Health or Behavioral Health Crisis  check blood pressure  weekly learn about high blood pressure call doctor for signs and symptoms of high blood pressure develop an action plan for high blood pressure keep all doctor appointments take medications for blood pressure exactly as prescribed report new symptoms to your doctor  Follow Up Plan: Telephone follow up appointment with care management team member scheduled for: 04-30-2023 at 1 pm          Patient verbalizes understanding of instructions and care plan provided today and agrees to view in MyChart. Active MyChart status and patient understanding of how to access instructions and care plan via MyChart confirmed with patient.  Telephone follow up appointment with care management team member scheduled for: 04-30-2023 at 1 pm

## 2023-03-12 NOTE — Chronic Care Management (AMB) (Signed)
Chronic Care Management   CCM RN Visit Note  03/12/2023 Name: Christopher Huang MRN: [id number] DOB: [birth year]  Subjective: Christopher Huang is a 72 y.o. year old male who is a primary care patient of Cannady, Dorie Rank, NP. The patient was referred to the Chronic Care Management team for assistance with care management needs subsequent to provider initiation of CCM services and plan of care.    Today's Visit:  Engaged with patient and his wife by telephone for follow up visit.        Goals Addressed             This Visit's Progress    CCM Expected Outcome:  Monitor, Self-Manage and Reduce Symptoms of Heart Failure       Current Barriers:  Knowledge Deficits related to the importance of daily weights in the patient with heart failure Care Coordination needs related to cost of Entresto and help with cost constraints, pharmacy consult pending in a patient with heart failure Chronic Disease Management support and education needs related to effective management of heart failure Wt Readings from Last 3 Encounters:  03/01/23 225 lb 4 oz (102.2 kg)  02/17/23 228 lb (103.4 kg)  01/25/23 228 lb (103.4 kg)     Planned Interventions: Basic overview and discussion of pathophysiology of Heart Failure reviewed. The patient saw the cardiologist recently and the patient and his wife said they got a good report. Denies any edema or swelling in feet or legs and abdomen. The patients weight is staying close to the same.  Provided education on low sodium diet. Review of monitoring for hidden sodium in foods Reviewed Heart Failure Action Plan in depth. The patient denies any edema or swelling in his feet and legs. The patient states that his weight is stable.  Assessed need for readable accurate scales in home. Has a scale Provided education about placing scale on hard, flat surface Advised patient to weigh each morning after emptying bladder Discussed importance of daily weight and advised patient to  weigh and record daily. The patients wife states they were weighing daily but are no longer doing this as his weight was staying the same. Education on the benefits of daily weights in  effective management of heart failure. Review of weight gain of +2/3 in one day or +5 in one week and the need to notify the provider.  The patients wife states the patient has not had swelling in feet or legs. Review of the Heart Failure Society of [place]: FACES F-fatique A- Activity intolerance C-Chest congestion/Cough E-Edema or swelling (feet, legs, ankles, abdomen) S-Shortness of breath Reviewed role of diuretics in prevention of fluid overload and management of heart failure. The cardiologist at recent visit added Jardiance 10 mg QD to his medication regimen. Education give on the benefits of Jardiance for kidney protection and helping decrease the risk of heart attack and stroke. Verbalized understanding.  Discussed the importance of keeping all appointments with provider Provided patient with education about the role of exercise in the management of heart failure. Also gave information to the patients wife about the Heart Failure Society of [place] website for helpful information and heart healthy diet information  Advised patient to discuss change in heart health and heart failure  with provider Screening for signs and symptoms of depression related to chronic disease state  Assessed social determinant of health barriers EF %= 30 to 35% on 06-2022. Review of EF % and what this means The patient had a procedure  for his back pain and discomfort and it worked well for him for a few weeks but now he is in pain again. He has a tens unit that he is hoping that will help. He goes back to see Dr. Cherylann Ratel soon and they are talking about an ablation. They are hopeful to find something that will work well for the patient. Reflective listening and support given.   Symptom Management: Take medications as prescribed    Attend all scheduled provider appointments Call provider office for new concerns or questions  call the Suicide and Crisis Lifeline: 988 call the [place] National Suicide Prevention Lifeline: 575-345-7417 or TTY: 630-295-7049 TTY (848) 215-3353) to talk to a trained counselor call 1-800-273-TALK (toll free, 24 hour hotline) if experiencing a Mental Health or Behavioral Health Crisis  call office if I gain more than 2 pounds in one day or 5 pounds in one week use salt in moderation watch for swelling in feet, ankles and legs every day weigh myself daily develop a rescue plan follow rescue plan if symptoms flare-up eat more whole grains, fruits and vegetables, lean meats and healthy fats track symptoms and what helps feel better or worse dress right for the weather, hot or cold  Follow Up Plan: Telephone follow up appointment with care management team member scheduled for: 04-30-2023 at 1 pm       CCM Expected Outcome:  Monitor, Self-Manage and Reduce Symptoms of: Dementia       Current Barriers:  Knowledge Deficits related to progression of dementia and how to effectively manage changes in condition Care Coordination needs related to medication needs and questions about possible side effects from Aricept  in a patient with dementia Chronic Disease Management support and education needs related to effective management of dementia  Planned Interventions: Evaluation of current treatment plan related to dementia  and patient's adherence to plan as established by provider. The patient is stable. They will follow up with Dr. Jerrel Ivory as scheduled. They have upcoming appointments with the pcp in May. The patient and his wife state the patient is stable. The patient has had no acute changes in his memory or decline in memory. The patient is being seen by pain specialist and the last injection helps with his pain and discomfort. They will go back soon and see the specialist and possibly get an ablation to  help with his chronic back pain.  Advised patient to call the office for acute changes in memory, anxiety, depression, or mood Provided education to patient re: talking to the provider about other medications effective for management of dementia. The patients wife is concerned that the Aricept may be causing headaches for the patient Reviewed medications with patient and discussed compliance and Aricept with possible side effects. Pharmacy consult for medication management and questions Provided patient with resources available as dementia progression takes place educational materials related to effective management of dementia, patient is stable at this time. The patient remains stable at this time. Education and support given.  Reviewed scheduled/upcoming provider appointments including 03-30-2023 at 840 am with pcp.  Pharmacy referral for medication reconciliation, management, and cost constraints Discussed plans with patient for ongoing care management follow up and provided patient with direct contact information for care management team Advised patient to discuss changes in mood, or mentation, acute onset of memory decline, fall and safety concerns with provider Screening for signs and symptoms of depression related to chronic disease state  Assessed social determinant of health barriers The patient has completed  his colonoscopy and they did find some polyps. The polyps were removed and he will have a repeat colonoscopy in 3 years.  Symptom Management: Take medications as prescribed   Attend all scheduled provider appointments Call provider office for new concerns or questions  call the Suicide and Crisis Lifeline: 988 call the [place] National Suicide Prevention Lifeline: 562-441-0045 or TTY: (470) 652-1419 TTY 7820583912) to talk to a trained counselor call 1-800-273-TALK (toll free, 24 hour hotline) if experiencing a Mental Health or Behavioral Health Crisis   Follow Up Plan: Telephone  follow up appointment with care management team member scheduled for: 04-30-2023 at 1 pm       CCM Expected Outcome:  Monitor, Self-Manage, and Reduce Symptoms of Hypertension       Current Barriers:  Knowledge Deficits related to the importance of taking blood pressures when the patient is having changes in sx and sx of elevated blood pressures  Chronic Disease Management support and education needs related to effective management of HTN BP Readings from Last 3 Encounters:  03/01/23 100/60  02/17/23 136/67  01/25/23 125/73     Planned Interventions: Evaluation of current treatment plan related to hypertension self management and patient's adherence to plan as established by provider. The patient has elevation in blood pressures at times especially when going to the providers office. The patient has had more stable blood pressures recently. Saw the cardiologist on 03-01-2023. Denies any acute changes in his blood pressures ;   Provided education to patient re: stroke prevention, s/s of heart attack and stroke; Reviewed prescribed diet heart healthy diet. Review and education done Reviewed medications with patient and discussed importance of compliance. Is compliant with medications. At cardiologist visit they added Jardiance 10 mg QD to his medication regimen. Review of this helping decrease his risk of heart attack and stroke and providing kidney protection as well. Has worked with pharm D in the past. Knows to call for any new needs, education, and support with effective management of medications;  Discussed plans with patient for ongoing care management follow up and provided patient with direct contact information for care management team; Advised patient, providing education and rationale, to monitor blood pressure daily and record, calling PCP for findings outside established parameters. Per the patients wife they have not been taking his blood pressure on a regular basis. Review of the benefits  of taking blood pressures on a regular basis and especially when he may feel different or have a headache;  Reviewed scheduled/upcoming provider appointments including: 03-30-2023 at 0840 am with the pcp. Saw cardiologist on 03-01-2023 Advised patient to discuss changes in blood pressures, sporadic headaches, and changes in heart health with provider; Provided education on prescribed diet heart healthy diet;  Discussed complications of poorly controlled blood pressure such as heart disease, stroke, circulatory complications, vision complications, kidney impairment, sexual dysfunction;  Screening for signs and symptoms of depression related to chronic disease state;  Assessed social determinant of health barriers;   Symptom Management: Take medications as prescribed   Attend all scheduled provider appointments Call provider office for new concerns or questions  call the Suicide and Crisis Lifeline: 988 call the [place] National Suicide Prevention Lifeline: (762)328-8778 or TTY: 763-209-4317 TTY (825) 348-5434) to talk to a trained counselor call 1-800-273-TALK (toll free, 24 hour hotline) if experiencing a Mental Health or Behavioral Health Crisis  check blood pressure weekly learn about high blood pressure call doctor for signs and symptoms of high blood pressure develop an action plan for high  blood pressure keep all doctor appointments take medications for blood pressure exactly as prescribed report new symptoms to your doctor  Follow Up Plan: Telephone follow up appointment with care management team member scheduled for: 04-30-2023 at 1 pm          Plan:Telephone follow up appointment with care management team member scheduled for:  04-30-2023 at 1 pm  Alto Denver RN, MSN, CCM RN Care Manager  Chronic Care Management Direct Number: [phone]

## 2023-03-15 ENCOUNTER — Ambulatory Visit (INDEPENDENT_AMBULATORY_CARE_PROVIDER_SITE_OTHER): Payer: PPO

## 2023-03-15 VITALS — Ht 73.0 in | Wt 225.0 lb

## 2023-03-15 DIAGNOSIS — Z0000 Encounter for general adult medical examination without abnormal findings: Secondary | ICD-10-CM | POA: Diagnosis not present

## 2023-03-15 NOTE — Progress Notes (Signed)
I connected with  Hedy Jacob on 03/15/23 by a audio enabled telemedicine application and verified that I am speaking with the correct person using two identifiers.  Patient Location: Home  Provider Location: Office/Clinic  I discussed the limitations of evaluation and management by telemedicine. The patient expressed understanding and agreed to proceed.  Subjective:   Christopher Huang is a 72 y.o. male who presents for Medicare Annual/Subsequent preventive examination.  Review of Systems     Cardiac Risk Factors include: advanced age (>62men, >39 women);male gender;hypertension     Objective:    There were no vitals filed for this visit. There is no height or weight on file to calculate BMI.     03/15/2023    1:44 PM 02/17/2023    8:45 AM 01/25/2023    1:50 PM 12/21/2022   10:21 AM 12/01/2022   10:00 AM 02/09/2022   12:20 PM 02/07/2021    3:20 PM  Advanced Directives  Does Patient Have a Medical Advance Directive? Yes Yes Yes Yes No No Yes  Type of Estate agent of [place];Living will Healthcare Power of [place];Living will Healthcare Power of [place];Living will Healthcare Power of [place];Living will Healthcare Power of [place];Living will  Healthcare Power of [place];Living will  Does patient want to make changes to medical advance directive? No - Patient declined        Copy of Healthcare Power of Attorney in Chart? Yes - validated most recent copy scanned in chart (See row information)      Yes - validated most recent copy scanned in chart (See row information)  Would patient like information on creating a medical advance directive?      No - Patient declined     Current Medications (verified) Outpatient Encounter Medications as of 03/15/2023  Medication Sig   Acetaminophen (ACETAMINOPHEN EXTRA STRENGTH) 500 MG capsule Take 1 capsule by mouth every 6 (six) hours as needed for fever.   ASPIRIN 81 PO Take 81 mg by mouth daily.   carvedilol (COREG)  3.125 MG tablet Take 1 tablet (3.125 mg total) by mouth 2 (two) times daily.   Cholecalciferol (VITAMIN D3) 50 MCG (2000 UT) TABS Take 2,000 Units by mouth daily.   Coenzyme Q10 100 MG TABS Take 100 mg by mouth daily.   docusate sodium (COLACE) 100 MG capsule Take 100 mg by mouth daily.   donepezil (ARICEPT) 5 MG tablet Take 1 tablet (5 mg total) by mouth at bedtime.   empagliflozin (JARDIANCE) 10 MG TABS tablet Take 1 tablet (10 mg total) by mouth daily before breakfast. Lot # [id number] Exp. 1/26   fexofenadine (ALLEGRA ALLERGY) 180 MG tablet Take 1 tablet (180 mg total) by mouth daily.   fluticasone (FLONASE) 50 MCG/ACT nasal spray PLACE 2 SPRAYS INTO EACH NOSTRIL DAILY.   Misc Natural Products (RELAX & SLEEP PO) Take by mouth. Reports taking "Relaxium" nightly for sleep (contains 5mg  Melatonin, 100mg  Magnesium, L-Tryptophan, Valerest, Ashwagandha, 100mg  GABA, Chamomile and Passionflower)   Multiple Vitamin (MULTIVITAMIN WITH MINERALS) TABS tablet Take 1 tablet by mouth daily. Men's Multivitamin 50+   rosuvastatin (CRESTOR) 40 MG tablet Take 1 tablet (40 mg total) by mouth daily. Stop taking 20 MG tablets.   sacubitril-valsartan (ENTRESTO) 49-51 MG Take 1 tablet by mouth 2 (two) times daily.   vitamin B-12 (CYANOCOBALAMIN) 500 MCG tablet Take 500 mcg by mouth daily.   No facility-administered encounter medications on file as of 03/15/2023.    Allergies (verified) Percocet [oxycodone-acetaminophen], Tramadol, and Vicodin [  hydrocodone-acetaminophen]   History: Past Medical History:  Diagnosis Date   Allergic rhinitis    Cancer of prostate 02/2007   s/p surgery   Dementia    History of kidney stones    H/O   Hyperlipidemia    Hypertension    Personal history of kidney stones    Past Surgical History:  Procedure Laterality Date   ANTERIOR LATERAL LUMBAR FUSION WITH PERCUTANEOUS SCREW 1 LEVEL N/A 01/29/2021   Procedure: L4-5 LATERAL INTERBODY FUSION, L4-5 POSTERIOR FUSION;  Surgeon:  Venetia Night, MD;  Location: [facility] ORS;  Service: Neurosurgery;  Laterality: N/A;   basal skin cancers     CATARACT EXTRACTION, BILATERAL     2021   COLONOSCOPY WITH PROPOFOL N/A 11/09/2017   Procedure: COLONOSCOPY WITH PROPOFOL;  Surgeon: Midge Minium, MD;  Location: [facility] ENDOSCOPY;  Service: Endoscopy;  Laterality: N/A;   COLONOSCOPY WITH PROPOFOL N/A 12/01/2022   Procedure: COLONOSCOPY WITH PROPOFOL;  Surgeon: Midge Minium, MD;  Location: [facility] ENDOSCOPY;  Service: Endoscopy;  Laterality: N/A;   EYE SURGERY Bilateral    Cataract   HAND SURGERY     THUMB SURGERY   KNEE ARTHROSCOPY     prostate cancer removed     TONSILLECTOMY     TONSILLECTOMY     TOTAL KNEE ARTHROPLASTY Left 02/09/2020   Procedure: TOTAL KNEE ARTHROPLASTY - RNFA;  Surgeon: Kennedy Bucker, MD;  Location: [facility] ORS;  Service: Orthopedics;  Laterality: Left;   Family History  Problem Relation Age of Onset   Dementia Mother    Prostate cancer Father    Social History   Socioeconomic History   Marital status: Married    Spouse name: Not on file   Number of children: Not on file   Years of education: Not on file   Highest education level: High school graduate  Occupational History   Occupation: retired   Tobacco Use   Smoking status: Never   Smokeless tobacco: Never  Vaping Use   Vaping Use: Never used  Substance and Sexual Activity   Alcohol use: No   Drug use: No   Sexual activity: Yes  Other Topics Concern   Not on file  Social History Narrative   Goes fishing and hunting    Meets with friends every morning for breakfast    Church    Social Determinants of Health   Financial Resource Strain: Low Risk  (03/15/2023)   Overall Financial Resource Strain (CARDIA)    Difficulty of Paying Living Expenses: Not hard at all  Food Insecurity: No Food Insecurity (03/15/2023)   Hunger Vital Sign    Worried About Running Out of Food in the Last Year: Never true    Ran Out of Food in the Last Year: Never true   Transportation Needs: No Transportation Needs (03/15/2023)   PRAPARE - Administrator, Civil Service (Medical): No    Lack of Transportation (Non-Medical): No  Physical Activity: Sufficiently Active (03/15/2023)   Exercise Vital Sign    Days of Exercise per Week: 3 days    Minutes of Exercise per Session: 60 min  Stress: No Stress Concern Present (03/15/2023)   [employer] of Occupational Health - Occupational Stress Questionnaire    Feeling of Stress : Not at all  Social Connections: Moderately Integrated (03/15/2023)   Social Connection and Isolation Panel [NHANES]    Frequency of Communication with Friends and Family: Three times a week    Frequency of Social Gatherings with Friends and Family: Three  times a week    Attends Religious Services: More than 4 times per year    Active Member of Clubs or Organizations: No    Attends Banker Meetings: Never    Marital Status: Married    Tobacco Counseling Counseling given: Not Answered   Clinical Intake:  Pre-visit preparation completed: Yes  Pain : No/denies pain     Nutritional Risks: None Diabetes: No  How often do you need to have someone help you when you read instructions, pamphlets, or other written materials from your doctor or pharmacy?: 1 - Never  Diabetic?o  Interpreter Needed?: No  Information entered by :: Kennedy Bucker, LPN   Activities of Daily Living    03/15/2023    1:45 PM 03/12/2023   11:00 PM  In your present state of health, do you have any difficulty performing the following activities:  Hearing? 0 0  Vision? 0 0  Difficulty concentrating or making decisions? 1 1  Walking or climbing stairs? 0 0  Dressing or bathing? 0 0  Doing errands, shopping? 0 0  Preparing Food and eating ? N N  Using the Toilet? N N  In the past six months, have you accidently leaked urine? N N  Do you have problems with loss of bowel control? N N  Managing your Medications? N N  Managing  your Finances? N N  Housekeeping or managing your Housekeeping? N N    Patient Care Team: Marjie Skiff, NP as PCP - General (Nurse Practitioner) Dominica Severin, MD as Consulting Physician (Orthopedic Surgery) Midge Minium, MD as Consulting Physician (Gastroenterology) Marlowe Sax, RN as Case Manager (General Practice)  Indicate any recent Medical Services you may have received from other than [facility] providers in the past year (date may be approximate).     Assessment:   This is a routine wellness examination for [employer].  Hearing/Vision screen Hearing Screening - Comments:: No aids Vision Screening - Comments:: Wears glasses- Dr.Woodard  Dietary issues and exercise activities discussed: Current Exercise Habits: Home exercise routine, Type of exercise: strength training/weights, Time (Minutes): 60, Frequency (Times/Week): 3, Weekly Exercise (Minutes/Week): 180, Intensity: Mild, Exercise limited by: None identified   Goals Addressed             This Visit's Progress    DIET - EAT MORE FRUITS AND VEGETABLES         Depression Screen    03/15/2023    1:42 PM 01/25/2023    1:50 PM 12/21/2022   10:21 AM 10/28/2022    8:26 AM 05/28/2022   10:32 AM 04/28/2022    9:34 AM 03/27/2022    8:04 AM  PHQ 2/9 Scores  PHQ - 2 Score 0 0 0 0  PHQ- 9 Score 0   0    Fall Risk    03/15/2023    1:45 PM 03/12/2023   11:00 PM 03/12/2023    1:32 PM 02/17/2023    8:45 AM 01/25/2023    1:49 PM  Fall Risk   Falls in the past year? 0 0 0 0 0  Number falls in past yr: 0  0    Injury with Fall? 0  0    Risk for fall due to : No Fall Risks  No Fall Risks    Follow up Falls prevention discussed;Falls evaluation completed  Falls evaluation completed;Education provided;Falls prevention discussed      FALL RISK PREVENTION PERTAINING TO THE HOME:  Any  stairs in or around the home? Yes  If so, are there any without handrails? No  Home free of loose throw rugs in walkways, pet beds,  electrical cords, etc? Yes  Adequate lighting in your home to reduce risk of falls? Yes   ASSISTIVE DEVICES UTILIZED TO PREVENT FALLS:  Life alert? No  Use of a cane, walker or w/c? No  Grab bars in the bathroom? Yes  Shower chair or bench in shower? Yes  Elevated toilet seat or a handicapped toilet? Yes   Cognitive Function:      05/05/2022   11:21 AM  Montreal Cognitive Assessment   Visuospatial/ Executive (0/5) 0  Naming (0/3) 3  Attention: Read list of digits (0/2) 2  Attention: Read list of letters (0/1) 1  Attention: Serial 7 subtraction starting at 100 (0/3) 0  Language: Repeat phrase (0/2) 1  Language : Fluency (0/1) 0  Abstraction (0/2) 1  Delayed Recall (0/5) 0  Orientation (0/6) 3  Total 11  Adjusted Score (based on education) 11      03/15/2023    1:48 PM 03/27/2022    8:22 AM 02/07/2021    3:23 PM 01/26/2019   10:03 AM 11/12/2017    2:29 PM  6CIT Screen  What Year? 0 points 0 points 0 points 0 points 0 points  What month? 3 points 0 points 0 points 0 points 0 points  What time? 3 points 0 points 0 points 0 points 0 points  Count back from 20 0 points 0 points 0 points 0 points 0 points  Months in reverse 4 points 4 points 2 points 0 points 0 points  Repeat phrase 10 points 10 points 0 points 0 points 0 points  Total Score 20 points 14 points 2 points 0 points 0 points    Immunizations Immunization History  Administered Date(s) Administered   Fluad Quad(high Dose 65+) 08/04/2019, 08/16/2020, 08/20/2021, 07/28/2022   Influenza, High Dose Seasonal PF 08/11/2018   Influenza-Unspecified 11/06/2014, 09/06/2015   PFIZER(Purple Top)SARS-COV-2 Vaccination 01/03/2020, 01/24/2020, 09/12/2020   Pneumococcal Conjugate-13 07/07/2016   Pneumococcal Polysaccharide-23 11/12/2017   Td 11/28/2015   Zoster Recombinat (Shingrix) 02/10/2022, 05/01/2022    TDAP status: Up to date  Flu Vaccine status: Up to date  Pneumococcal vaccine status: Up to date  Covid-19  vaccine status: Completed vaccines  Qualifies for Shingles Vaccine? Yes   Zostavax completed No   Shingrix Completed?: Yes  Screening Tests Health Maintenance  Topic Date Due   COVID-19 Vaccine (4 - 2023-24 season) 07/24/2022   INFLUENZA VACCINE  06/24/2023   Medicare Annual Wellness (AWV)  03/14/2024   DTaP/Tdap/Td (2 - Tdap) 11/27/2025   COLONOSCOPY (Pts 45-63yrs Insurance coverage will need to be confirmed)  12/01/2025   Pneumonia Vaccine 36+ Years old  Completed   Hepatitis C Screening  Completed   Zoster Vaccines- Shingrix  Completed   HPV VACCINES  Aged Out    Health Maintenance  Health Maintenance Due  Topic Date Due   COVID-19 Vaccine (4 - 2023-24 season) 07/24/2022    Colorectal cancer screening: Type of screening: Colonoscopy. Completed 12/01/22. Repeat every 3 years  Lung Cancer Screening: (Low Dose CT Chest recommended if Age 53-80 years, 30 pack-year currently smoking OR have quit w/in 15years.) does not qualify.   Additional Screening:  Hepatitis C Screening: does qualify; Completed 01/14/17  Vision Screening: Recommended annual ophthalmology exams for early detection of glaucoma and other disorders of the eye. Is the patient up to date with their  annual eye exam?  Yes  Who is the provider or what is the name of the office in which the patient attends annual eye exams? Dr.Woodard If pt is not established with a provider, would they like to be referred to a provider to establish care? No .   Dental Screening: Recommended annual dental exams for proper oral hygiene  Community Resource Referral / Chronic Care Management: CRR required this visit?  No   CCM required this visit?  No      Plan:     I have personally reviewed and noted the following in the patient's chart:   Medical and social history Use of alcohol, tobacco or illicit drugs  Current medications and supplements including opioid prescriptions. Patient is not currently taking opioid  prescriptions. Functional ability and status Nutritional status Physical activity Advanced directives List of other physicians Hospitalizations, surgeries, and ER visits in previous 12 months Vitals Screenings to include cognitive, depression, and falls Referrals and appointments  In addition, I have reviewed and discussed with patient certain preventive protocols, quality metrics, and best practice recommendations. A written personalized care plan for preventive services as well as general preventive health recommendations were provided to patient.     Hal Hope, LPN   6/96/2952   Nurse Notes: none

## 2023-03-15 NOTE — Patient Instructions (Addendum)
Christopher Huang , Thank you for taking time to come for your Medicare Wellness Visit. I appreciate your ongoing commitment to your health goals. Please review the following plan we discussed and let me know if I can assist you in the future.   These are the goals we discussed:  Goals      CCM Expected Outcome:  Monitor, Self-Manage and Reduce Symptoms of Heart Failure     Current Barriers:  Knowledge Deficits related to the importance of daily weights in the patient with heart failure Care Coordination needs related to cost of Entresto and help with cost constraints, pharmacy consult pending in a patient with heart failure Chronic Disease Management support and education needs related to effective management of heart failure Wt Readings from Last 3 Encounters:  03/01/23 225 lb 4 oz (102.2 kg)  02/17/23 228 lb (103.4 kg)  01/25/23 228 lb (103.4 kg)     Planned Interventions: Basic overview and discussion of pathophysiology of Heart Failure reviewed. The patient saw the cardiologist recently and the patient and his wife said they got a good report. Denies any edema or swelling in feet or legs and abdomen. The patients weight is staying close to the same.  Provided education on low sodium diet. Review of monitoring for hidden sodium in foods Reviewed Heart Failure Action Plan in depth. The patient denies any edema or swelling in his feet and legs. The patient states that his weight is stable.  Assessed need for readable accurate scales in home. Has a scale Provided education about placing scale on hard, flat surface Advised patient to weigh each morning after emptying bladder Discussed importance of daily weight and advised patient to weigh and record daily. The patients wife states they were weighing daily but are no longer doing this as his weight was staying the same. Education on the benefits of daily weights in  effective management of heart failure. Review of weight gain of +2/3 in one day or  +5 in one week and the need to notify the provider.  The patients wife states the patient has not had swelling in feet or legs. Review of the Heart Failure Society of [place]: FACES F-fatique A- Activity intolerance C-Chest congestion/Cough E-Edema or swelling (feet, legs, ankles, abdomen) S-Shortness of breath Reviewed role of diuretics in prevention of fluid overload and management of heart failure. The cardiologist at recent visit added Jardiance 10 mg QD to his medication regimen. Education give on the benefits of Jardiance for kidney protection and helping decrease the risk of heart attack and stroke. Verbalized understanding.  Discussed the importance of keeping all appointments with provider Provided patient with education about the role of exercise in the management of heart failure. Also gave information to the patients wife about the Heart Failure Society of [place] website for helpful information and heart healthy diet information  Advised patient to discuss change in heart health and heart failure  with provider Screening for signs and symptoms of depression related to chronic disease state  Assessed social determinant of health barriers EF %= 30 to 35% on 06-2022. Review of EF % and what this means The patient had a procedure for his back pain and discomfort and it worked well for him for a few weeks but now he is in pain again. He has a tens unit that he is hoping that will help. He goes back to see Christopher Huang soon and they are talking about an ablation. They are hopeful to find something that will  work well for the patient. Reflective listening and support given.   Symptom Management: Take medications as prescribed   Attend all scheduled provider appointments Call provider office for new concerns or questions  call the Suicide and Crisis Lifeline: 988 call the [place] National Suicide Prevention Lifeline: 657-071-0267 or TTY: 8484140079 TTY (231)653-8105) to talk to a trained  counselor call 1-800-273-TALK (toll free, 24 hour hotline) if experiencing a Mental Health or Behavioral Health Crisis  call office if I gain more than 2 pounds in one day or 5 pounds in one week use salt in moderation watch for swelling in feet, ankles and legs every day weigh myself daily develop a rescue plan follow rescue plan if symptoms flare-up eat more whole grains, fruits and vegetables, lean meats and healthy fats track symptoms and what helps feel better or worse dress right for the weather, hot or cold  Follow Up Plan: Telephone follow up appointment with care management team member scheduled for: 04-30-2023 at 1 pm       CCM Expected Outcome:  Monitor, Self-Manage and Reduce Symptoms of: Dementia     Current Barriers:  Knowledge Deficits related to progression of dementia and how to effectively manage changes in condition Care Coordination needs related to medication needs and questions about possible side effects from Aricept  in a patient with dementia Chronic Disease Management support and education needs related to effective management of dementia  Planned Interventions: Evaluation of current treatment plan related to dementia  and patient's adherence to plan as established by provider. The patient is stable. They will follow up with Christopher Huang as scheduled. They have upcoming appointments with the pcp in May. The patient and his wife state the patient is stable. The patient has had no acute changes in his memory or decline in memory. The patient is being seen by pain specialist and the last injection helps with his pain and discomfort. They will go back soon and see the specialist and possibly get an ablation to help with his chronic back pain.  Advised patient to call the office for acute changes in memory, anxiety, depression, or mood Provided education to patient re: talking to the provider about other medications effective for management of dementia. The patients wife is  concerned that the Aricept may be causing headaches for the patient Reviewed medications with patient and discussed compliance and Aricept with possible side effects. Pharmacy consult for medication management and questions Provided patient with resources available as dementia progression takes place educational materials related to effective management of dementia, patient is stable at this time. The patient remains stable at this time. Education and support given.  Reviewed scheduled/upcoming provider appointments including 03-30-2023 at 840 am with pcp.  Pharmacy referral for medication reconciliation, management, and cost constraints Discussed plans with patient for ongoing care management follow up and provided patient with direct contact information for care management team Advised patient to discuss changes in mood, or mentation, acute onset of memory decline, fall and safety concerns with provider Screening for signs and symptoms of depression related to chronic disease state  Assessed social determinant of health barriers The patient has completed his colonoscopy and they did find some polyps. The polyps were removed and he will have a repeat colonoscopy in 3 years.  Symptom Management: Take medications as prescribed   Attend all scheduled provider appointments Call provider office for new concerns or questions  call the Suicide and Crisis Lifeline: 988 call the [place] National Suicide Prevention Lifeline: 984-641-1311  or TTY: 2676720205 TTY 802-521-1870) to talk to a trained counselor call 1-800-273-TALK (toll free, 24 hour hotline) if experiencing a Mental Health or Behavioral Health Crisis   Follow Up Plan: Telephone follow up appointment with care management team member scheduled for: 04-30-2023 at 1 pm       CCM Expected Outcome:  Monitor, Self-Manage, and Reduce Symptoms of Hypertension     Current Barriers:  Knowledge Deficits related to the importance of taking blood pressures  when the patient is having changes in sx and sx of elevated blood pressures  Chronic Disease Management support and education needs related to effective management of HTN BP Readings from Last 3 Encounters:  03/01/23 100/60  02/17/23 136/67  01/25/23 125/73     Planned Interventions: Evaluation of current treatment plan related to hypertension self management and patient's adherence to plan as established by provider. The patient has elevation in blood pressures at times especially when going to the providers office. The patient has had more stable blood pressures recently. Saw the cardiologist on 03-01-2023. Denies any acute changes in his blood pressures ;   Provided education to patient re: stroke prevention, s/s of heart attack and stroke; Reviewed prescribed diet heart healthy diet. Review and education done Reviewed medications with patient and discussed importance of compliance. Is compliant with medications. At cardiologist visit they added Jardiance 10 mg QD to his medication regimen. Review of this helping decrease his risk of heart attack and stroke and providing kidney protection as well. Has worked with pharm D in the past. Knows to call for any new needs, education, and support with effective management of medications;  Discussed plans with patient for ongoing care management follow up and provided patient with direct contact information for care management team; Advised patient, providing education and rationale, to monitor blood pressure daily and record, calling PCP for findings outside established parameters. Per the patients wife they have not been taking his blood pressure on a regular basis. Review of the benefits of taking blood pressures on a regular basis and especially when he may feel different or have a headache;  Reviewed scheduled/upcoming provider appointments including: 03-30-2023 at 0840 am with the pcp. Saw cardiologist on 03-01-2023 Advised patient to discuss changes in  blood pressures, sporadic headaches, and changes in heart health with provider; Provided education on prescribed diet heart healthy diet;  Discussed complications of poorly controlled blood pressure such as heart disease, stroke, circulatory complications, vision complications, kidney impairment, sexual dysfunction;  Screening for signs and symptoms of depression related to chronic disease state;  Assessed social determinant of health barriers;   Symptom Management: Take medications as prescribed   Attend all scheduled provider appointments Call provider office for new concerns or questions  call the Suicide and Crisis Lifeline: 988 call the [place] National Suicide Prevention Lifeline: (431)320-4555 or TTY: (878)737-4280 TTY (272) 455-7701) to talk to a trained counselor call 1-800-273-TALK (toll free, 24 hour hotline) if experiencing a Mental Health or Behavioral Health Crisis  check blood pressure weekly learn about high blood pressure call doctor for signs and symptoms of high blood pressure develop an action plan for high blood pressure keep all doctor appointments take medications for blood pressure exactly as prescribed report new symptoms to your doctor  Follow Up Plan: Telephone follow up appointment with care management team member scheduled for: 04-30-2023 at 1 pm       DIET - EAT MORE FRUITS AND VEGETABLES     DIET - INCREASE WATER INTAKE  Recommend drinking at least 6-8 glasses of water a day      Patient Stated     02/07/2021, wants to be more active and lose 20 pounds     Weight (lb) < 200 lb (90.7 kg)        This is a list of the screening recommended for you and due dates:  Health Maintenance  Topic Date Due   COVID-19 Vaccine (4 - 2023-24 season) 07/24/2022   Flu Shot  06/24/2023   Medicare Annual Wellness Visit  03/14/2024   DTaP/Tdap/Td vaccine (2 - Tdap) 11/27/2025   Colon Cancer Screening  12/01/2025   Pneumonia Vaccine  Completed   Hepatitis C Screening:  USPSTF Recommendation to screen - Ages 18-79 yo.  Completed   Zoster (Shingles) Vaccine  Completed   HPV Vaccine  Aged Out    Advanced directives: no  Conditions/risks identified: none  Next appointment: Follow up in one year for your annual wellness visit. 03/20/24 @ 11:15 am by phone  Preventive Care 65 Years and Older, Male  Preventive care refers to lifestyle choices and visits with your health care provider that can promote health and wellness. What does preventive care include? A yearly physical exam. This is also called an annual well check. Dental exams once or twice a year. Routine eye exams. Ask your health care provider how often you should have your eyes checked. Personal lifestyle choices, including: Daily care of your teeth and gums. Regular physical activity. Eating a healthy diet. Avoiding tobacco and drug use. Limiting alcohol use. Practicing safe sex. Taking low doses of aspirin every day. Taking vitamin and mineral supplements as recommended by your health care provider. What happens during an annual well check? The services and screenings done by your health care provider during your annual well check will depend on your age, overall health, lifestyle risk factors, and family history of disease. Counseling  Your health care provider may ask you questions about your: Alcohol use. Tobacco use. Drug use. Emotional well-being. Home and relationship well-being. Sexual activity. Eating habits. History of falls. Memory and ability to understand (cognition). Work and work Astronomer. Screening  You may have the following tests or measurements: Height, weight, and BMI. Blood pressure. Lipid and cholesterol levels. These may be checked every 5 years, or more frequently if you are over 47 years old. Skin check. Lung cancer screening. You may have this screening every year starting at age 2 if you have a 30-pack-year history of smoking and currently smoke or  have quit within the past 15 years. Fecal occult blood test (FOBT) of the stool. You may have this test every year starting at age 48. Flexible sigmoidoscopy or colonoscopy. You may have a sigmoidoscopy every 5 years or a colonoscopy every 10 years starting at age 71. Prostate cancer screening. Recommendations will vary depending on your family history and other risks. Hepatitis C blood test. Hepatitis B blood test. Sexually transmitted disease (STD) testing. Diabetes screening. This is done by checking your blood sugar (glucose) after you have not eaten for a while (fasting). You may have this done every 1-3 years. Abdominal aortic aneurysm (AAA) screening. You may need this if you are a current or former smoker. Osteoporosis. You may be screened starting at age 67 if you are at high risk. Talk with your health care provider about your test results, treatment options, and if necessary, the need for more tests. Vaccines  Your health care provider may recommend certain vaccines, such as: Influenza  vaccine. This is recommended every year. Tetanus, diphtheria, and acellular pertussis (Tdap, Td) vaccine. You may need a Td booster every 10 years. Zoster vaccine. You may need this after age 36. Pneumococcal 13-valent conjugate (PCV13) vaccine. One dose is recommended after age 64. Pneumococcal polysaccharide (PPSV23) vaccine. One dose is recommended after age 30. Talk to your health care provider about which screenings and vaccines you need and how often you need them. This information is not intended to replace advice given to you by your health care provider. Make sure you discuss any questions you have with your health care provider. Document Released: 12/06/2015 Document Revised: 07/29/2016 Document Reviewed: 09/10/2015 Elsevier Interactive Patient Education  2017 [employer].  Fall Prevention in the Home Falls can cause injuries. They can happen to people of all ages. There are many things  you can do to make your home safe and to help prevent falls. What can I do on the outside of my home? Regularly fix the edges of walkways and driveways and fix any cracks. Remove anything that might make you trip as you walk through a door, such as a raised step or threshold. Trim any bushes or trees on the path to your home. Use bright outdoor lighting. Clear any walking paths of anything that might make someone trip, such as rocks or tools. Regularly check to see if handrails are loose or broken. Make sure that both sides of any steps have handrails. Any raised decks and porches should have guardrails on the edges. Have any leaves, snow, or ice cleared regularly. Use sand or salt on walking paths during winter. Clean up any spills in your garage right away. This includes oil or grease spills. What can I do in the bathroom? Use night lights. Install grab bars by the toilet and in the tub and shower. Do not use towel bars as grab bars. Use non-skid mats or decals in the tub or shower. If you need to sit down in the shower, use a plastic, non-slip stool. Keep the floor dry. Clean up any water that spills on the floor as soon as it happens. Remove soap buildup in the tub or shower regularly. Attach bath mats securely with double-sided non-slip rug tape. Do not have throw rugs and other things on the floor that can make you trip. What can I do in the bedroom? Use night lights. Make sure that you have a light by your bed that is easy to reach. Do not use any sheets or blankets that are too big for your bed. They should not hang down onto the floor. Have a firm chair that has side arms. You can use this for support while you get dressed. Do not have throw rugs and other things on the floor that can make you trip. What can I do in the kitchen? Clean up any spills right away. Avoid walking on wet floors. Keep items that you use a lot in easy-to-reach places. If you need to reach something  above you, use a strong step stool that has a grab bar. Keep electrical cords out of the way. Do not use floor polish or wax that makes floors slippery. If you must use wax, use non-skid floor wax. Do not have throw rugs and other things on the floor that can make you trip. What can I do with my stairs? Do not leave any items on the stairs. Make sure that there are handrails on both sides of the stairs and use them. Fix  handrails that are broken or loose. Make sure that handrails are as long as the stairways. Check any carpeting to make sure that it is firmly attached to the stairs. Fix any carpet that is loose or worn. Avoid having throw rugs at the top or bottom of the stairs. If you do have throw rugs, attach them to the floor with carpet tape. Make sure that you have a light switch at the top of the stairs and the bottom of the stairs. If you do not have them, ask someone to add them for you. What else can I do to help prevent falls? Wear shoes that: Do not have high heels. Have rubber bottoms. Are comfortable and fit you well. Are closed at the toe. Do not wear sandals. If you use a stepladder: Make sure that it is fully opened. Do not climb a closed stepladder. Make sure that both sides of the stepladder are locked into place. Ask someone to hold it for you, if possible. Clearly mark and make sure that you can see: Any grab bars or handrails. First and last steps. Where the edge of each step is. Use tools that help you move around (mobility aids) if they are needed. These include: Canes. Walkers. Scooters. Crutches. Turn on the lights when you go into a dark area. Replace any light bulbs as soon as they burn out. Set up your furniture so you have a clear path. Avoid moving your furniture around. If any of your floors are uneven, fix them. If there are any pets around you, be aware of where they are. Review your medicines with your doctor. Some medicines can make you feel dizzy.  This can increase your chance of falling. Ask your doctor what other things that you can do to help prevent falls. This information is not intended to replace advice given to you by your health care provider. Make sure you discuss any questions you have with your health care provider. Document Released: 09/05/2009 Document Revised: 04/16/2016 Document Reviewed: 12/14/2014 Elsevier Interactive Patient Education  2017 [employer].

## 2023-03-22 ENCOUNTER — Encounter: Payer: Self-pay | Admitting: Student in an Organized Health Care Education/Training Program

## 2023-03-22 ENCOUNTER — Ambulatory Visit
Payer: PPO | Attending: Student in an Organized Health Care Education/Training Program | Admitting: Student in an Organized Health Care Education/Training Program

## 2023-03-22 VITALS — BP 132/79 | HR 52 | Temp 97.9°F | Resp 16 | Ht 73.0 in | Wt 228.0 lb

## 2023-03-22 DIAGNOSIS — M47816 Spondylosis without myelopathy or radiculopathy, lumbar region: Secondary | ICD-10-CM | POA: Diagnosis not present

## 2023-03-22 DIAGNOSIS — M961 Postlaminectomy syndrome, not elsewhere classified: Secondary | ICD-10-CM | POA: Insufficient documentation

## 2023-03-22 DIAGNOSIS — M48061 Spinal stenosis, lumbar region without neurogenic claudication: Secondary | ICD-10-CM

## 2023-03-22 DIAGNOSIS — M5136 Other intervertebral disc degeneration, lumbar region: Secondary | ICD-10-CM | POA: Insufficient documentation

## 2023-03-22 NOTE — Patient Instructions (Signed)
______________________________________________________________________  Preparing for your procedure  Appointments: If you think you may not be able to keep your appointment, call 24-48 hours in advance to cancel. We need time to make it available to others.  During your procedure appointment there will be: No Prescription Refills. No disability issues to discussed. No medication changes or discussions.  Instructions: Food intake: Avoid eating anything solid for at least 8 hours prior to your procedure. Clear liquid intake: You may take clear liquids such as water up to 2 hours prior to your procedure. (No carbonated drinks. No soda.) Transportation: Unless otherwise stated by your physician, bring a driver. Morning Medicines: Except for blood thinners, take all of your other morning medications with a sip of water. Make sure to take your heart and blood pressure medicines. If your blood pressure's lower number is above 100, the case will be rescheduled. Blood thinners: Make sure to stop your blood thinners as instructed.  If you take a blood thinner, but were not instructed to stop it, call our office [phone] and ask to talk to a nurse. Not stopping a blood thinner prior to certain procedures could lead to serious complications. Diabetics on insulin: Notify the staff so that you can be scheduled 1st case in the morning. If your diabetes requires high dose insulin, take only  of your normal insulin dose the morning of the procedure and notify the staff that you have done so. Preventing infections: Shower with an antibacterial soap the morning of your procedure.  Build-up your immune system: Take 1000 mg of Vitamin C with every meal (3 times a day) the day prior to your procedure. Antibiotics: Inform the nursing staff if you are taking any antibiotics or if you have any conditions that may require antibiotics prior to procedures. (Example: recent joint implants)   Pregnancy: If you are  pregnant make sure to notify the nursing staff. Not doing so may result in injury to the fetus, including death.  Sickness: If you have a cold, fever, or any active infections, call and cancel or reschedule your procedure. Receiving steroids while having an infection may result in complications. Arrival: You must be in the facility at least 30 minutes prior to your scheduled procedure. Tardiness: Your scheduled time is also the cutoff time. If you do not arrive at least 15 minutes prior to your procedure, you will be rescheduled.  Children: Do not bring any children with you. Make arrangements to keep them home. Dress appropriately: There is always a possibility that your clothing may get soiled. Avoid long dresses. Valuables: Do not bring any jewelry or valuables.  Reasons to call and reschedule or cancel your procedure: (Following these recommendations will minimize the risk of a serious complication.) Surgeries: Avoid having procedures within 2 weeks of any surgery. (Avoid for 2 weeks before or after any surgery). Flu Shots: Avoid having procedures within 2 weeks of a flu shots or . (Avoid for 2 weeks before or after immunizations). Barium: Avoid having a procedure within 7-10 days after having had a radiological study involving the use of radiological contrast. (Myelograms, Barium swallow or enema study). Heart attacks: Avoid any elective procedures or surgeries for the initial 6 months after a "Myocardial Infarction" (Heart Attack). Blood thinners: It is imperative that you stop these medications before procedures. Let us know if you if you take any blood thinner.  Infection: Avoid procedures during or within two weeks of an infection (including chest colds or gastrointestinal problems). Symptoms associated with infections   include: Localized redness, fever, chills, night sweats or profuse sweating, burning sensation when voiding, cough, congestion, stuffiness, runny nose, sore throat, diarrhea,  nausea, vomiting, cold or Flu symptoms, recent or current infections. It is specially important if the infection is over the area that we intend to treat. Heart and lung problems: Symptoms that may suggest an active cardiopulmonary problem include: cough, chest pain, breathing difficulties or shortness of breath, dizziness, ankle swelling, uncontrolled high or unusually low blood pressure, and/or palpitations. If you are experiencing any of these symptoms, cancel your procedure and contact your primary care physician for an evaluation.  Remember:  Regular Business hours are:  Monday to Thursday 8:00 AM to 4:00 PM  Provider's Schedule: Francisco Naveira, MD:  Procedure days: Tuesday and Thursday 7:30 AM to 4:00 PM  Bilal Lateef, MD:  Procedure days: Monday and Wednesday 7:30 AM to 4:00 PM  ______________________________________________________________________  Radiofrequency Ablation Radiofrequency ablation is a procedure that is performed to relieve pain. The procedure is often used for back, neck, or arm pain. Radiofrequency ablation involves the use of a machine that creates radio waves to make heat. During the procedure, the heat is applied to the nerve that carries the pain signal. The heat damages the nerve and interferes with the pain signal. Pain relief usually starts about 2 weeks after the procedure and lasts for 6 months to 1 year. Tell a health care provider about: Any allergies you have. All medicines you are taking, including vitamins, herbs, eye drops, creams, and over-the-counter medicines. Any problems you or family members have had with anesthetic medicines. Any bleeding problems you have. Any surgeries you have had. Any medical conditions you have. Whether you are pregnant or may be pregnant. What are the risks? Generally, this is a safe procedure. However, problems may occur, including: Pain or soreness at the injection site. Allergic reaction to medicines given during  the procedure. Bleeding. Infection at the injection site. Damage to nerves or blood vessels. What happens before the procedure? When to stop eating and drinking Follow instructions from your health care provider about what you may eat and drink before your procedure. These may include: 8 hours before the procedure Stop eating most foods. Do not eat meat, fried foods, or fatty foods. Eat only light foods, such as toast or crackers. All liquids are okay except energy drinks and alcohol. 6 hours before the procedure Stop eating. Drink only clear liquids, such as water, clear fruit juice, black coffee, plain tea, and sports drinks. Do not drink energy drinks or alcohol. 2 hours before the procedure Stop drinking all liquids. You may be allowed to take medicine with small sips of water. If you do not follow your health care provider's instructions, your procedure may be delayed or canceled. Medicines Ask your health care provider about: Changing or stopping your regular medicines. This is especially important if you are taking diabetes medicines or blood thinners. Taking medicines such as aspirin and ibuprofen. These medicines can thin your blood. Do not take these medicines unless your health care provider tells you to take them. Taking over-the-counter medicines, vitamins, herbs, and supplements. General instructions Ask your health care provider what steps will be taken to help prevent infection. These steps may include: Removing hair at the procedure site. Washing skin with a germ-killing soap. Taking antibiotic medicine. If you will be going home right after the procedure, plan to have a responsible adult: Take you home from the hospital or clinic. You will not be allowed to   drive. Care for you for the time you are told. What happens during the procedure?  You will be awake during the procedure. You will need to be able to talk with the health care provider during the procedure. An  IV will be inserted into one of your veins. You will be given one or more of the following: A medicine to help you relax (sedative). A medicine to numb the area (local anesthetic). Your health care provider will insert a radiofrequency needle into the area to be treated. This is done with the help of fluoroscopy. A wire that carries the radio waves (electrode) will be put through the radiofrequency needle. An electrical pulse will be sent through the electrode to verify the correct nerve that is causing your pain. You will feel a tingling sensation, and you may have muscle twitching. The tissue around the needle tip will be heated by an electric current that comes from the radiofrequency machine. This will numb the nerves. The needle will be removed. A bandage (dressing) will be put on the insertion area. The procedure may vary among health care providers and hospitals. What happens after the procedure? Your blood pressure, heart rate, breathing rate, and blood oxygen level will be monitored until you leave the hospital or clinic. Return to your normal activities as told by your health care provider. Ask your health care provider what activities are safe for you. If you were given a sedative during the procedure, it can affect you for several hours. Do not drive or operate machinery until your health care provider says that it is safe. Summary Radiofrequency ablation is a procedure that is performed to relieve pain. The procedure is often used for back, neck, or arm pain. Radiofrequency ablation involves the use of a machine that creates radio waves to make heat. Plan to have a responsible adult take you home from the hospital or clinic. Do not drive or operate machinery until your health care provider says that it is safe. Return to your normal activities as told by your health care provider. Ask your health care provider what activities are safe for you. This information is not intended to  replace advice given to you by your health care provider. Make sure you discuss any questions you have with your health care provider. Document Revised: 04/29/2021 Document Reviewed: 04/29/2021 Elsevier Patient Education  2023 [employer]  

## 2023-03-22 NOTE — Progress Notes (Signed)
PROVIDER NOTE: Information contained herein reflects review and annotations entered in association with encounter. Interpretation of such information and data should be left to medically-trained personnel. Information provided to patient can be located elsewhere in the medical record under "Patient Instructions". Document created using STT-dictation technology, any transcriptional errors that may result from process are unintentional.    Patient: Christopher Huang  Service Category: E/M  Provider: Edward Jolly, MD  DOB: [birth year]  DOS: 03/22/2023  Referring Provider: Marjie Skiff, NP  MRN: [id number]  Specialty: Interventional Pain Management  PCP: Christopher Skiff, NP  Type: Established Patient  Setting: Ambulatory outpatient    Location: Office  Delivery: Face-to-face     HPI  Christopher Huang, a 72 y.o. year old male, is here today because of his Lumbar facet arthropathy [M47.816]. [name] primary complain today is Back Pain  Pertinent problems: [name] has Lumbar facet arthropathy; Spinal stenosis, lumbar region, without neurogenic claudication (L3/4); and Lumbar degenerative disc disease on their pertinent problem list. Pain Assessment: Severity of Chronic pain is reported as a 2 /10. Location: Back Lower/denies. Onset: More than a month ago. Quality: Aching, Constant, Stabbing. Timing:  . Modifying factor(s): back brace and tens unit. Vitals:  height is 6\' 1"  (1.854 m) and weight is 228 lb (103.4 kg). His temperature is 97.9 F (36.6 C). His blood pressure is 132/79 and his pulse is 52 (abnormal). His respiration is 16 and oxygen saturation is 98%.  BMI: Estimated body mass index is 30.08 kg/m as calculated from the following:   Height as of this encounter: 6\' 1"  (1.854 m).   Weight as of this encounter: 228 lb (103.4 kg). Last encounter: 01/25/2023. Last procedure: 02/17/2023.  Reason for encounter: post-procedure evaluation and assessment.    Post-procedure evaluation    Type: Lumbar Facet, Medial Branch Block(s) #2  Laterality: Bilateral  Level: L2, L3, and L4 Medial Branch Level(s). Injecting these levels blocks the L2-3 and L3-4 lumbar facet joints. Imaging: Fluoroscopic guidance         Anesthesia: Local anesthesia (1-2% Lidocaine) Anxiolysis: Oral Valium 5 mg DOS: 02/17/2023 Performed by: Edward Jolly, MD  Primary Purpose: Diagnostic/Therapeutic Indications: Low back pain severe enough to impact quality of life or function. 1. Lumbar facet arthropathy   2. Lumbar degenerative disc disease    NAS-11 Pain score:   Pre-procedure: 2 /10   Post-procedure: 2 /10      Effectiveness:  Initial hour after procedure: 100 %  Subsequent 4-6 hours post-procedure: 100 %  Analgesia past initial 6 hours: 50 % (2 weeks)  Ongoing improvement:  Analgesic:  <20% Function: Somewhat improved ROM: Back to baseline    ROS  Constitutional: Denies any fever or chills Gastrointestinal: No reported hemesis, hematochezia, vomiting, or acute GI distress Musculoskeletal:  + low back pain Neurological: No reported episodes of acute onset apraxia, aphasia, dysarthria, agnosia, amnesia, paralysis, loss of coordination, or loss of consciousness  Medication Review  Acetaminophen Extra Strength, Aspirin, Coenzyme Q10, Misc Natural Products, Vitamin D3, carvedilol, cyanocobalamin, docusate sodium, donepezil, empagliflozin, fexofenadine, fluticasone, multivitamin with minerals, rosuvastatin, and sacubitril-valsartan  History Review  Allergy: [name] is allergic to percocet [oxycodone-acetaminophen], tramadol, and vicodin [hydrocodone-acetaminophen]. Drug: [name]  reports no history of drug use. Alcohol:  reports no history of alcohol use. Tobacco:  reports that he has never smoked. He has never used smokeless tobacco. Social: [name]  reports that he has never smoked. He has never used smokeless tobacco. He reports that  he does not drink alcohol and does not use  drugs. Medical:  has a past medical history of Allergic rhinitis, Cancer of prostate (HCC) (02/2007), Dementia (HCC), History of kidney stones, Hyperlipidemia, Hypertension, and Personal history of kidney stones. Surgical: [name]  has a past surgical history that includes prostate cancer removed; Tonsillectomy; Knee arthroscopy; basal skin cancers; Tonsillectomy; Colonoscopy with propofol (N/A, 11/09/2017); Hand surgery; Total knee arthroplasty (Left, 02/09/2020); Eye surgery (Bilateral); Anterior lateral lumbar fusion with percutaneous screw 1 level (N/A, 01/29/2021); Cataract extraction, bilateral; and Colonoscopy with propofol (N/A, 12/01/2022). Family: family history includes Dementia in his mother; Prostate cancer in his father.  Laboratory Chemistry Profile   Renal Lab Results  Component Value Date   BUN 15 10/28/2022   CREATININE 0.84 10/28/2022   BCR 18 10/28/2022   GFRAA 103 08/16/2020   GFRNONAA >60 12/03/2020    Hepatic Lab Results  Component Value Date   AST 27 10/28/2022   ALT 30 10/28/2022   ALBUMIN 4.5 10/28/2022   ALKPHOS 88 10/28/2022    Electrolytes Lab Results  Component Value Date   NA 141 10/28/2022   K 4.4 10/28/2022   CL 102 10/28/2022   CALCIUM 9.3 10/28/2022    Bone Lab Results  Component Value Date   VD25OH 33.0 10/28/2022    Inflammation (CRP: Acute Phase) (ESR: Chronic Phase) No results found for: "CRP", "ESRSEDRATE", "LATICACIDVEN"       Note: Above Lab results reviewed.  Recent Imaging Review  DG PAIN CLINIC C-ARM 1-60 MIN NO REPORT Fluoro was used, but no Radiologist interpretation will be provided.  Please refer to "NOTES" tab for provider progress note. Note: Reviewed        Physical Exam  General appearance: Well nourished, well developed, and well hydrated. In no apparent acute distress Mental status: Alert, oriented x 3 (person, place, & time)       Respiratory: No evidence of acute respiratory distress Eyes: PERLA Vitals: BP  132/79   Pulse (!) 52   Temp 97.9 F (36.6 C)   Resp 16   Ht 6\' 1"  (1.854 m)   Wt 228 lb (103.4 kg)   SpO2 98%   BMI 30.08 kg/m  BMI: Estimated body mass index is 30.08 kg/m as calculated from the following:   Height as of this encounter: 6\' 1"  (1.854 m).   Weight as of this encounter: 228 lb (103.4 kg). Ideal: Ideal body weight: 79.9 kg (176 lb 2.4 oz) Adjusted ideal body weight: 89.3 kg (196 lb 14.2 oz)  Lumbar Spine Area Exam  Skin & Axial Inspection: Well healed scar from previous spine surgery detected Alignment: Symmetrical Functional ROM: Unrestricted ROM       Stability: No instability detected Muscle Tone/Strength: Functionally intact. No obvious neuro-muscular anomalies detected. Sensory (Neurological): Musculoskeletal pain pattern Palpation: No palpable anomalies       Provocative Tests: Hyperextension/rotation test: (+) bilaterally for facet joint pain. Lumbar quadrant test (Kemp's test): (+) bilaterally for facet joint pain. Lateral bending test: Unable to perform due to fusion restriction.   Low back pain, worse with lumbar extension and facet loading  Assessment   Diagnosis Status  1. Lumbar facet arthropathy   2. Lumbar degenerative disc disease   3. Failed back surgical syndrome   4. Spinal stenosis, lumbar region, without neurogenic claudication (L3/4)    Responding Persistent Controlled   Updated Problems: Problem  Failed Back Surgical Syndrome    Plan of Care  Christopher Huang is status post 2 positive diagnostic  lumbar facet medial branch nerve blocks, 12/21/2022 and 02/17/2023 both of which provided at least 80% pain relief.  He states that his previous 1 lasted for approximately 2 weeks.  Given return of pain, we discussed lumbar radiofrequency ablation for the purpose of obtaining longer-term pain relief.  Risk and benefits were reviewed and patient would like to proceed.  We also briefly discussed spinal cord stimulation for failed back surgical syndrome.   We will see how he does with lumbar RFA before further discussing spinal cord stimulator trial   Orders:  Orders Placed This Encounter  Procedures   Radiofrequency,Lumbar    Standing Status:   Future    Standing Expiration Date:   06/21/2023    Scheduling Instructions:     Side(s): Bilateral     Level(s): L2, L3, L4,  Medial Branch Nerve(s)     Sedation: With Sedation     Scheduling Timeframe: As soon as pre-approved    Order Specific Question:   Where will this procedure be performed?    Answer:   [facility]   Follow-up plan:   Return in about 16 days (around 04/07/2023) for B/L L3, 4, 5 RFA , in clinic IV Versed (block 40 mins).      Recent Visits Date Type Provider Dept  02/17/23 Procedure visit Edward Jolly, MD [facility]  01/25/23 Office Visit Edward Jolly, MD [facility]  Showing recent visits within past 90 days and meeting all other requirements Today's Visits Date Type Provider Dept  03/22/23 Office Visit Edward Jolly, MD [facility]  Showing today's visits and meeting all other requirements Future Appointments Date Type Provider Dept  04/07/23 Appointment Edward Jolly, MD [facility]  Showing future appointments within next 90 days and meeting all other requirements  I discussed the assessment and treatment plan with the patient. The patient was provided an opportunity to ask questions and all were answered. The patient agreed with the plan and demonstrated an understanding of the instructions.  Patient advised to call back or seek an in-person evaluation if the symptoms or condition worsens.  Duration of encounter: .  Total time on encounter, as per AMA guidelines included both the face-to-face and non-face-to-face time personally spent by the physician and/or other qualified health care professional(s) on the day of the encounter (includes time in activities that require the physician or other  qualified health care professional and does not include time in activities normally performed by clinical staff). Physician's time may include the following activities when performed: Preparing to see the patient (e.g., pre-charting review of records, searching for previously ordered imaging, lab work, and nerve conduction tests) Review of prior analgesic pharmacotherapies. Reviewing PMP Interpreting ordered tests (e.g., lab work, imaging, nerve conduction tests) Performing post-procedure evaluations, including interpretation of diagnostic procedures Obtaining and/or reviewing separately obtained history Performing a medically appropriate examination and/or evaluation Counseling and educating the patient/family/caregiver Ordering medications, tests, or procedures Referring and communicating with other health care professionals (when not separately reported) Documenting clinical information in the electronic or other health record Independently interpreting results (not separately reported) and communicating results to the patient/ family/caregiver Care coordination (not separately reported)  Note by: Edward Jolly, MD Date: 03/22/2023; Time: 3:39 PM

## 2023-03-23 DIAGNOSIS — F0280 Dementia in other diseases classified elsewhere without behavioral disturbance: Secondary | ICD-10-CM

## 2023-03-23 DIAGNOSIS — I5020 Unspecified systolic (congestive) heart failure: Secondary | ICD-10-CM

## 2023-03-23 DIAGNOSIS — I10 Essential (primary) hypertension: Secondary | ICD-10-CM

## 2023-03-23 DIAGNOSIS — G3109 Other frontotemporal dementia: Secondary | ICD-10-CM

## 2023-03-27 NOTE — Patient Instructions (Signed)

## 2023-03-30 ENCOUNTER — Encounter: Payer: Self-pay | Admitting: Nurse Practitioner

## 2023-03-30 ENCOUNTER — Ambulatory Visit (INDEPENDENT_AMBULATORY_CARE_PROVIDER_SITE_OTHER): Payer: PPO | Admitting: Nurse Practitioner

## 2023-03-30 VITALS — BP 112/70 | HR 62 | Temp 97.6°F | Ht 72.99 in | Wt 219.5 lb

## 2023-03-30 DIAGNOSIS — M5136 Other intervertebral disc degeneration, lumbar region: Secondary | ICD-10-CM

## 2023-03-30 DIAGNOSIS — E559 Vitamin D deficiency, unspecified: Secondary | ICD-10-CM | POA: Diagnosis not present

## 2023-03-30 DIAGNOSIS — Z9889 Other specified postprocedural states: Secondary | ICD-10-CM

## 2023-03-30 DIAGNOSIS — E782 Mixed hyperlipidemia: Secondary | ICD-10-CM

## 2023-03-30 DIAGNOSIS — G3109 Other frontotemporal dementia: Secondary | ICD-10-CM | POA: Diagnosis not present

## 2023-03-30 DIAGNOSIS — F0280 Dementia in other diseases classified elsewhere without behavioral disturbance: Secondary | ICD-10-CM | POA: Diagnosis not present

## 2023-03-30 DIAGNOSIS — Z0000 Encounter for general adult medical examination without abnormal findings: Secondary | ICD-10-CM

## 2023-03-30 DIAGNOSIS — Z8546 Personal history of malignant neoplasm of prostate: Secondary | ICD-10-CM

## 2023-03-30 DIAGNOSIS — I10 Essential (primary) hypertension: Secondary | ICD-10-CM | POA: Diagnosis not present

## 2023-03-30 DIAGNOSIS — I5020 Unspecified systolic (congestive) heart failure: Secondary | ICD-10-CM | POA: Diagnosis not present

## 2023-03-30 NOTE — Assessment & Plan Note (Signed)
Chronic.  Diagnosed on 06/30/22 with EF 30-35%.  Followed by cardiology with recent Jardiance added and tolerating.  Euvolemic today.  Educated his wife and him on HF.  Recommend: - Reminded to call for an overnight weight gain of >2 pounds or a weekly weight gain of >5 pounds - not adding salt to food and read food labels. Reviewed the importance of keeping daily sodium intake to 2000mg  daily.  - No NSAIDS

## 2023-03-30 NOTE — Assessment & Plan Note (Signed)
Ongoing and taking supplement at home, continue this and check Vit D today. ?

## 2023-03-30 NOTE — Assessment & Plan Note (Signed)
Chronic, ongoing.  BP at goal today.  Continue current medication regimen and collaboration with cardiology.  Recommend he monitor BP at least a few mornings a week at home and document.  DASH diet at home.  Labs today: CBC, CMP, TSH.

## 2023-03-30 NOTE — Assessment & Plan Note (Signed)
Chronic, stable.  Continue Tylenol if needed for pain.  Continue to collaborate with neurosurgery and pain management.

## 2023-03-30 NOTE — Assessment & Plan Note (Addendum)
Chronic, progressive.  Diagnosed 04/15/22, stable at this time.  Family history of dementia in mother and father.  At this time continue collaboration with neurology as needed and current Aricept dosing - they do not wish to increase to 10 MG. B12 level today.

## 2023-03-30 NOTE — Assessment & Plan Note (Signed)
Chronic, ongoing.  Continue Rosuvastatin daily.  Adjust dose as needed.  Obtain labs today: CMP and Lipid.   

## 2023-03-30 NOTE — Assessment & Plan Note (Signed)
PSA today and recent remains stable. ?

## 2023-03-30 NOTE — Progress Notes (Signed)
BP 112/70   Pulse 62   Temp 97.6 F (36.4 C) (Oral)   Ht 6' 0.99" (1.854 m)   Wt 219 lb 8 oz (99.6 kg)   SpO2 96%   BMI 28.97 kg/m    Subjective:    Patient ID: Christopher Huang, male    DOB: [birth year], 72 y.o.   MRN: [id number]  HPI: Christopher Huang is a 72 y.o. male presenting on 03/30/2023 for comprehensive medical examination. Current medical complaints include: none  He currently lives with: wife Interim Problems from his last visit: yes  DEMENTIA: Initial presentation 03/27/22 and imaging was performed 04/15/22 noting asymmetric left temporal lobe atrophy, ?semantic dementia.  Initial visit with neurology on 05/05/22, Dr. Delena Bali, to continue on Donepezil and perform neuropsychological testing -- they decided not to pursue this at this time. Tolerating Aricept, he feels this is helping some. Both of his parents had mild dementia, both lived into upper 77's.  His wife and him report no recent changes in memory.  They are doing walking for exercise, however having back pain -- history of back surgery in March 2022.  He is seeing pain clinic, last 03/22/23 and is having ablation of nerves upcoming.    03/15/2023    1:48 PM 03/27/2022    8:22 AM 02/07/2021    3:23 PM 01/26/2019   10:03 AM 11/12/2017    2:29 PM  6CIT Screen  What Year? 0 points 0 points 0 points 0 points 0 points  What month? 3 points 0 points 0 points 0 points 0 points  What time? 3 points 0 points 0 points 0 points 0 points  Count back from 20 0 points 0 points 0 points 0 points 0 points  Months in reverse 4 points 4 points 2 points 0 points 0 points  Repeat phrase 10 points 10 points 0 points 0 points 0 points  Total Score 20 points 14 points 2 points 0 points 0 points   HYPERTENSION / HYPERLIPIDEMIA/HF Last saw cardiology 03/01/2023, they started Jardiance.  Continues on Lisinopril and Rosuvastatin. Carotid doppler noted minor atherosclerosis, no stenosis. Continues on Enstresto, Crestor, Carvedilol.   History of  prostate cancer -- 2008 -- had complete removal of prostate == PSA in May 2023 = 0.2.   Continues B12 and Vit D supplements. Satisfied with current treatment? yes Duration of hypertension: chronic BP monitoring frequency: not checking BP range:  BP medication side effects: no Past BP meds:  Duration of hyperlipidemia: chronic Cholesterol medication side effects: no Cholesterol supplements: none Past cholesterol medications:  Medication compliance: good compliance Aspirin: yes Recent stressors: no Recurrent headaches: no Visual changes: no Palpitations: no Dyspnea: no Chest pain: no Lower extremity edema: no Dizzy/lightheaded: no   Functional Status Survey: Is the patient deaf or have difficulty hearing?: No Does the patient have difficulty seeing, even when wearing glasses/contacts?: No Does the patient have difficulty concentrating, remembering, or making decisions?: No Does the patient have difficulty walking or climbing stairs?: No Does the patient have difficulty dressing or bathing?: No Does the patient have difficulty doing errands alone such as visiting a doctor's office or shopping?: No  FALL RISK:    03/30/2023    8:42 AM 03/22/2023    2:48 PM 03/15/2023    1:45 PM 03/12/2023   11:00 PM 03/12/2023    1:32 PM  Fall Risk   Falls in the past year? 0 0 0 0 0  Number falls in past yr: 0  0  0  Injury with Fall? 0  0  0  Risk for fall due to : No Fall Risks  No Fall Risks  No Fall Risks  Follow up   Falls prevention discussed;Falls evaluation completed  Falls evaluation completed;Education provided;Falls prevention discussed       03/30/2023    8:42 AM 03/15/2023    1:42 PM 01/25/2023    1:50 PM 12/21/2022   10:21 AM 10/28/2022    8:26 AM  Depression screen PHQ 2/9  Decreased Interest 0 0 0 0 1  Down, Depressed, Hopeless 0 0 0 0 1  PHQ - 2 Score 0 0 0 0 2  Altered sleeping 0 0   0  Tired, decreased energy 0 0   1  Change in appetite 0 0   0  Feeling bad or failure  about yourself  0 0   0  Trouble concentrating 0 0   0  Moving slowly or fidgety/restless 0 0   0  Suicidal thoughts 0 0   0  PHQ-9 Score 0 0   3  Difficult doing work/chores Not difficult at all Not difficult at all   Somewhat difficult       03/30/2023    8:42 AM 10/28/2022    8:27 AM 05/28/2022   10:32 AM 04/28/2022    9:34 AM  GAD 7 : Generalized Anxiety Score  Nervous, Anxious, on Edge 0 0 0 0  Control/stop worrying 0 0 0 0  Worry too much - different things 0 1 0 0  Trouble relaxing 0 0 0 0  Restless 0 0 0 0  Easily annoyed or irritable 0 0 0 0  Afraid - awful might happen 0 0 0 0  Total GAD 7 Score 0 1 0 0  Anxiety Difficulty Not difficult at all Somewhat difficult Not difficult at all Not difficult at all    A voluntary discussion about advance care planning including the explanation and discussion of advance directives was extensively discussed  with the patient for 15 minutes with patient and wife present.  Explanation about the health care proxy and Living will was reviewed and packet with forms with explanation of how to fill them out was given.  During this discussion, the patient was able to identify a health care proxy as his wife and plans to fill out the paperwork required.  Patient was offered a separate Advance Care Planning visit for further assistance with forms.     Past Medical History:  Past Medical History:  Diagnosis Date   Allergic rhinitis    Cancer of prostate (HCC) 02/2007   s/p surgery   Dementia (HCC)    History of kidney stones    H/O   Hyperlipidemia    Hypertension    Personal history of kidney stones     Surgical History:  Past Surgical History:  Procedure Laterality Date   ANTERIOR LATERAL LUMBAR FUSION WITH PERCUTANEOUS SCREW 1 LEVEL N/A 01/29/2021   Procedure: L4-5 LATERAL INTERBODY FUSION, L4-5 POSTERIOR FUSION;  Surgeon: Venetia Night, MD;  Location: [facility] ORS;  Service: Neurosurgery;  Laterality: N/A;   basal skin cancers      CATARACT EXTRACTION, BILATERAL     2021   COLONOSCOPY WITH PROPOFOL N/A 11/09/2017   Procedure: COLONOSCOPY WITH PROPOFOL;  Surgeon: Midge Minium, MD;  Location: [facility] ENDOSCOPY;  Service: Endoscopy;  Laterality: N/A;   COLONOSCOPY WITH PROPOFOL N/A 12/01/2022   Procedure: COLONOSCOPY WITH PROPOFOL;  Surgeon: Midge Minium, MD;  Location:  [facility] ENDOSCOPY;  Service: Endoscopy;  Laterality: N/A;   EYE SURGERY Bilateral    Cataract   HAND SURGERY     THUMB SURGERY   KNEE ARTHROSCOPY     prostate cancer removed     TONSILLECTOMY     TONSILLECTOMY     TOTAL KNEE ARTHROPLASTY Left 02/09/2020   Procedure: TOTAL KNEE ARTHROPLASTY - RNFA;  Surgeon: Kennedy Bucker, MD;  Location: [facility] ORS;  Service: Orthopedics;  Laterality: Left;    Medications:  Current Outpatient Medications on File Prior to Visit  Medication Sig   Acetaminophen (ACETAMINOPHEN EXTRA STRENGTH) 500 MG capsule Take 1 capsule by mouth every 6 (six) hours as needed for fever.   ASPIRIN 81 PO Take 81 mg by mouth daily.   carvedilol (COREG) 3.125 MG tablet Take 1 tablet (3.125 mg total) by mouth 2 (two) times daily.   Cholecalciferol (VITAMIN D3) 50 MCG (2000 UT) TABS Take 2,000 Units by mouth daily.   Coenzyme Q10 100 MG TABS Take 100 mg by mouth daily.   docusate sodium (COLACE) 100 MG capsule Take 100 mg by mouth daily.   donepezil (ARICEPT) 5 MG tablet Take 1 tablet (5 mg total) by mouth at bedtime.   empagliflozin (JARDIANCE) 10 MG TABS tablet Take 1 tablet (10 mg total) by mouth daily before breakfast. Lot # [id number] Exp. 1/26   fexofenadine (ALLEGRA ALLERGY) 180 MG tablet Take 1 tablet (180 mg total) by mouth daily.   fluticasone (FLONASE) 50 MCG/ACT nasal spray PLACE 2 SPRAYS INTO EACH NOSTRIL DAILY.   Misc Natural Products (RELAX & SLEEP PO) Take by mouth. Reports taking "Relaxium" nightly for sleep (contains 5mg  Melatonin, 100mg  Magnesium, L-Tryptophan, Valerest, Ashwagandha, 100mg  GABA, Chamomile and Passionflower)   Multiple  Vitamin (MULTIVITAMIN WITH MINERALS) TABS tablet Take 1 tablet by mouth daily. Men's Multivitamin 50+   rosuvastatin (CRESTOR) 40 MG tablet Take 1 tablet (40 mg total) by mouth daily. Stop taking 20 MG tablets.   sacubitril-valsartan (ENTRESTO) 49-51 MG Take 1 tablet by mouth 2 (two) times daily.   vitamin B-12 (CYANOCOBALAMIN) 500 MCG tablet Take 500 mcg by mouth daily.   No current facility-administered medications on file prior to visit.    Allergies:  Allergies  Allergen Reactions   Percocet [Oxycodone-Acetaminophen] Nausea And Vomiting   Tramadol Nausea And Vomiting   Vicodin [Hydrocodone-Acetaminophen] Nausea And Vomiting    Social History:  Social History   Socioeconomic History   Marital status: Married    Spouse name: Not on file   Number of children: Not on file   Years of education: Not on file   Highest education level: High school graduate  Occupational History   Occupation: retired   Tobacco Use   Smoking status: Never   Smokeless tobacco: Never  Vaping Use   Vaping Use: Never used  Substance and Sexual Activity   Alcohol use: No   Drug use: No   Sexual activity: Yes  Other Topics Concern   Not on file  Social History Narrative   Goes fishing and hunting    Meets with friends every morning for breakfast    Church    Social Determinants of Health   Financial Resource Strain: Low Risk  (03/15/2023)   Overall Financial Resource Strain (CARDIA)    Difficulty of Paying Living Expenses: Not hard at all  Food Insecurity: No Food Insecurity (03/15/2023)   Hunger Vital Sign    Worried About Running Out of Food in the Last Year: Never true  Ran Out of Food in the Last Year: Never true  Transportation Needs: No Transportation Needs (03/15/2023)   PRAPARE - Administrator, Civil Service (Medical): No    Lack of Transportation (Non-Medical): No  Physical Activity: Sufficiently Active (03/15/2023)   Exercise Vital Sign    Days of Exercise per Week: 3  days    Minutes of Exercise per Session: 60 min  Stress: No Stress Concern Present (03/15/2023)   [employer] of Occupational Health - Occupational Stress Questionnaire    Feeling of Stress : Not at all  Social Connections: Moderately Integrated (03/15/2023)   Social Connection and Isolation Panel [NHANES]    Frequency of Communication with Friends and Family: Three times a week    Frequency of Social Gatherings with Friends and Family: Three times a week    Attends Religious Services: More than 4 times per year    Active Member of Clubs or Organizations: No    Attends Banker Meetings: Never    Marital Status: Married  Catering manager Violence: Not At Risk (03/15/2023)   Humiliation, Afraid, Rape, and Kick questionnaire    Fear of Current or Ex-Partner: No    Emotionally Abused: No    Physically Abused: No    Sexually Abused: No   Social History   Tobacco Use  Smoking Status Never  Smokeless Tobacco Never   Social History   Substance and Sexual Activity  Alcohol Use No    Family History:  Family History  Problem Relation Age of Onset   Dementia Mother    Prostate cancer Father     Past medical history, surgical history, medications, allergies, family history and social history reviewed with patient today and changes made to appropriate areas of the chart.   ROS All other ROS negative except what is listed above and in the HPI.      Objective:    BP 112/70   Pulse 62   Temp 97.6 F (36.4 C) (Oral)   Ht 6' 0.99" (1.854 m)   Wt 219 lb 8 oz (99.6 kg)   SpO2 96%   BMI 28.97 kg/m   Wt Readings from Last 3 Encounters:  03/30/23 219 lb 8 oz (99.6 kg)  03/22/23 228 lb (103.4 kg)  03/15/23 225 lb (102.1 kg)    Physical Exam Vitals and nursing note reviewed.  Constitutional:      General: He is awake. He is not in acute distress.    Appearance: He is well-developed and well-groomed. He is not ill-appearing or toxic-appearing.  HENT:     Head:  Normocephalic and atraumatic.     Right Ear: Hearing, tympanic membrane, ear canal and external ear normal. No drainage.     Left Ear: Hearing, tympanic membrane, ear canal and external ear normal. No drainage.     Nose: Nose normal.     Mouth/Throat:     Pharynx: Uvula midline.  Eyes:     General: Lids are normal.        Right eye: No discharge.        Left eye: No discharge.     Extraocular Movements: Extraocular movements intact.     Conjunctiva/sclera: Conjunctivae normal.     Pupils: Pupils are equal, round, and reactive to light.     Visual Fields: Right eye visual fields normal and left eye visual fields normal.  Neck:     Thyroid: No thyromegaly.     Vascular: No carotid bruit or JVD.  Trachea: Trachea normal.  Cardiovascular:     Rate and Rhythm: Normal rate and regular rhythm.     Heart sounds: Normal heart sounds, S1 normal and S2 normal. No murmur heard.    No gallop.  Pulmonary:     Effort: Pulmonary effort is normal. No accessory muscle usage or respiratory distress.     Breath sounds: Normal breath sounds.  Abdominal:     General: Bowel sounds are normal.     Palpations: Abdomen is soft. There is no hepatomegaly or splenomegaly.     Tenderness: There is no abdominal tenderness.  Musculoskeletal:        General: Normal range of motion.     Cervical back: Normal range of motion and neck supple.     Right lower leg: No edema.     Left lower leg: No edema.  Lymphadenopathy:     Head:     Right side of head: No submental, submandibular, tonsillar, preauricular or posterior auricular adenopathy.     Left side of head: No submental, submandibular, tonsillar, preauricular or posterior auricular adenopathy.     Cervical: No cervical adenopathy.  Skin:    General: Skin is warm and dry.     Capillary Refill: Capillary refill takes less than 2 seconds.     Findings: No rash.  Neurological:     Mental Status: He is alert and oriented to person, place, and time.      Cranial Nerves: Cranial nerves 2-12 are intact.     Motor: Motor function is intact.     Coordination: Coordination is intact.     Gait: Gait is intact.     Deep Tendon Reflexes: Reflexes are normal and symmetric.     Reflex Scores:      Brachioradialis reflexes are 2+ on the right side and 2+ on the left side.      Patellar reflexes are 2+ on the right side and 2+ on the left side. Psychiatric:        Attention and Perception: Attention normal.        Mood and Affect: Mood normal.        Speech: Speech normal.        Behavior: Behavior normal. Behavior is cooperative.        Thought Content: Thought content normal.        Cognition and Memory: Cognition normal.    Results for orders placed or performed during the hospital encounter of 12/01/22  Surgical pathology  Result Value Ref Range   SURGICAL PATHOLOGY      SURGICAL PATHOLOGY CASE: [id number] PATIENT: Vernie Murders Surgical Pathology Report     Specimen Submitted: A. Colon polyp x7, sigmoid; cold snare B. Colon polyp x4, right; cold snare C. Colon polyp, transverse; cold snare  Clinical History: History of colon polyps.  Colon polyps, diverticulosis    DIAGNOSIS: A. COLON POLYPS X 7, SIGMOID; COLD SNARE: - MULTIPLE FRAGMENTS OF TUBULAR ADENOMAS. - NEGATIVE FOR HIGH-GRADE DYSPLASIA AND MALIGNANCY.  B. COLON POLYPS X 4, RIGHT; COLD SNARE: - MULTIPLE FRAGMENTS OF TUBULAR ADENOMAS. - NEGATIVE FOR HIGH-GRADE DYSPLASIA AND MALIGNANCY.  C. COLON POLYP, TRANSVERSE; COLD SNARE: - TUBULAR ADENOMA. - NEGATIVE FOR HIGH-GRADE DYSPLASIA AND MALIGNANCY.  GROSS DESCRIPTION: A. Labeled: Cold snare sigmoid colon polyp x 7 Received: Formalin Collection time: 11:10 AM on 12/01/2022 Placed into formalin time: 11:10 AM on 12/01/2022 Tissue fragment(s): Multiple Size: Aggregate, 2.3 x 0.8 x 0.2 cm Description: Received are fra gments of tan-pink soft tissue  admixed with intestinal debris.  The ratio of soft tissue to  intestinal debris is 90: 10. Entirely submitted in 1 cassette.  B. Labeled: Cold snare right side colon polyp x 4 Received: Formalin Collection time: 11:14 AM on 12/01/2022 Placed into formalin time: 11:14 AM on 12/01/2022 Tissue fragment(s): Multiple Size: Aggregate, 2.5 x 0.7 x 0.1 cm Description: Received are fragments of tan-pink soft tissue admixed with intestinal debris.  The ratio of soft tissue to intestinal debris is 90: 10. Entirely submitted in 1 cassette.  C. Labeled: Cold snare transverse colon polyp Received: Formalin Collection time: 11:32 AM on 12/01/2022 Placed into formalin time: 11:32 AM on 12/01/2022 Tissue fragment(s): Multiple Size: Aggregate, 2 x 0.3 x 0.1 cm Description: Received are fragments of tan-pink soft tissue admixed with intestinal debris.  The ratio of soft tissue to intestinal debris is 80: 20. Entirely submitted in 1 cassette.  RB 12/01/2022   Final Diagnosis performed by Katherine Mantle, MD.   Electronically signed 12/02/2022 9:03:11AM The electronic signature indicates that the named Attending Pathologist has evaluated the specimen Technical component performed at [facility], [address] [place], [place] [address] Lab: [phone] Dir:  Schimke, MD, MMM  Professional component performed at [facility], [facility], [address] [place], [place], [place] [address] Lab: [phone] Dir: Beryle Quant, MD       Assessment & Plan:   Problem List Items Addressed This Visit       Cardiovascular and Mediastinum   Heart failure with reduced ejection fraction (HCC)    Chronic.  Diagnosed on 06/30/22 with EF 30-35%.  Followed by cardiology with recent Jardiance added and tolerating.  Euvolemic today.  Educated his wife and him on HF.  Recommend: - Reminded to call for an overnight weight gain of >2 pounds or a weekly weight gain of >5 pounds - not adding salt to food and read food labels. Reviewed the importance of keeping daily sodium  intake to 2000mg  daily.  - No NSAIDS      Relevant Orders   CBC with Differential/Platelet   Comprehensive metabolic panel   Hypertension    Chronic, ongoing.  BP at goal today.  Continue current medication regimen and collaboration with cardiology.  Recommend he monitor BP at least a few mornings a week at home and document.  DASH diet at home.  Labs today: CBC, CMP, TSH.         Relevant Orders   CBC with Differential/Platelet   Comprehensive metabolic panel   TSH     Nervous and Auditory   Frontotemporal dementia (HCC) - Primary    Chronic, progressive.  Diagnosed 04/15/22, stable at this time.  Family history of dementia in mother and father.  At this time continue collaboration with neurology as needed and current Aricept dosing - they do not wish to increase to 10 MG. B12 level today.      Relevant Orders   Vitamin B12     Other   History of back surgery    Chronic, stable.  Continue Tylenol if needed for pain.  Continue to collaborate with neurosurgery and pain management.      History of prostate cancer    PSA today and recent remains stable.      Relevant Orders   PSA   Hyperlipidemia    Chronic, ongoing.  Continue Rosuvastatin daily.  Adjust dose as needed.  Obtain labs today: CMP and Lipid.        Relevant Orders   Comprehensive metabolic  panel   Lipid Panel w/o Chol/HDL Ratio   Vitamin D deficiency    Ongoing and taking supplement at home, continue this and check Vit D today.      Relevant Orders   VITAMIN D 25 Hydroxy (Vit-D Deficiency, Fractures)   Other Visit Diagnoses     Encounter for annual physical exam       Annual physical today with labs and health maintenance reviewed, discussed with patient.        Discussed aspirin prophylaxis for myocardial infarction prevention and decision was made to continue ASA  LABORATORY TESTING:  Health maintenance labs ordered today as discussed above.   The natural history of prostate cancer and  ongoing controversy regarding screening and potential treatment outcomes of prostate cancer has been discussed with the patient. The meaning of a false positive PSA and a false negative PSA has been discussed. He indicates understanding of the limitations of this screening test and wishes to proceed with screening PSA testing.   IMMUNIZATIONS:   - Tdap: Tetanus vaccination status reviewed: last tetanus booster within 10 years. - Influenza: Up to date - Pneumovax: Up to date - Prevnar: Up to date - Zostavax vaccine: Up to date   SCREENING: - Colonoscopy: Up to date  Discussed with patient purpose of the colonoscopy is to detect colon cancer at curable precancerous or early stages   - AAA Screening: Not applicable  -Hearing Test: Not applicable  -Spirometry: Not applicable   PATIENT COUNSELING:    Sexuality: Discussed sexually transmitted diseases, partner selection, use of condoms, avoidance of unintended pregnancy  and contraceptive alternatives.   Advised to avoid cigarette smoking.  I discussed with the patient that most people either abstain from alcohol or drink within safe limits (<=14/week and <=4 drinks/occasion for males, <=7/weeks and <= 3 drinks/occasion for females) and that the risk for alcohol disorders and other health effects rises proportionally with the number of drinks per week and how often a drinker exceeds daily limits.  Discussed cessation/primary prevention of drug use and availability of treatment for abuse.   Diet: Encouraged to adjust caloric intake to maintain  or achieve ideal body weight, to reduce intake of dietary saturated fat and total fat, to limit sodium intake by avoiding high sodium foods and not adding table salt, and to maintain adequate dietary potassium and calcium preferably from fresh fruits, vegetables, and low-fat dairy products.    Stressed the importance of regular exercise  Injury prevention: Discussed safety belts, safety helmets, smoke  detector, smoking near bedding or upholstery.   Dental health: Discussed importance of regular tooth brushing, flossing, and dental visits.   Follow up plan: NEXT PREVENTATIVE PHYSICAL DUE IN 1 YEAR. Return in about 6 months (around 09/30/2023) for DEMENTIA, HF, HTN/HLD.

## 2023-03-31 LAB — VITAMIN D 25 HYDROXY (VIT D DEFICIENCY, FRACTURES): Vit D, 25-Hydroxy: 50.4 ng/mL (ref 30.0–100.0)

## 2023-03-31 LAB — PSA

## 2023-03-31 LAB — LIPID PANEL W/O CHOL/HDL RATIO

## 2023-03-31 LAB — TSH

## 2023-03-31 NOTE — Progress Notes (Signed)
Contacted via MyChart   Good evening Christopher Huang, your far I only have CBC and Vitamin D level back -- both look great.  Waiting on remainder of labs and will let you know when they return.  Any questions?

## 2023-04-01 LAB — CBC WITH DIFFERENTIAL/PLATELET
Basophils Absolute: 0 10*3/uL (ref 0.0–0.2)
Basos: 1 %
EOS (ABSOLUTE): 0.1 10*3/uL (ref 0.0–0.4)
Eos: 4 %
Hematocrit: 44.4 % (ref 37.5–51.0)
Hemoglobin: 14.8 g/dL (ref 13.0–17.7)
Immature Grans (Abs): 0 10*3/uL (ref 0.0–0.1)
Immature Granulocytes: 0 %
Lymphocytes Absolute: 0.7 10*3/uL (ref 0.7–3.1)
Lymphs: 19 %
MCH: 31.3 pg (ref 26.6–33.0)
MCHC: 33.3 g/dL (ref 31.5–35.7)
MCV: 94 fL (ref 79–97)
Monocytes Absolute: 0.3 10*3/uL (ref 0.1–0.9)
Monocytes: 9 %
Neutrophils Absolute: 2.5 10*3/uL (ref 1.4–7.0)
Neutrophils: 67 %
Platelets: 208 10*3/uL (ref 150–450)
RBC: 4.73 x10E6/uL (ref 4.14–5.80)
RDW: 13.2 % (ref 11.6–15.4)
WBC: 3.7 10*3/uL (ref 3.4–10.8)

## 2023-04-01 LAB — COMPREHENSIVE METABOLIC PANEL
ALT: 13 IU/L (ref 0–44)
AST: 22 IU/L (ref 0–40)
Albumin/Globulin Ratio: 2.7 — ABNORMAL HIGH (ref 1.2–2.2)
Albumin: 4.6 g/dL (ref 3.8–4.8)
Alkaline Phosphatase: 93 IU/L (ref 44–121)
BUN/Creatinine Ratio: 13 (ref 10–24)
BUN: 12 mg/dL (ref 8–27)
Bilirubin Total: 0.6 mg/dL (ref 0.0–1.2)
CO2: 19 mmol/L — ABNORMAL LOW (ref 20–29)
Calcium: 8.4 mg/dL — ABNORMAL LOW (ref 8.6–10.2)
Chloride: 106 mmol/L (ref 96–106)
Creatinine, Ser: 0.96 mg/dL (ref 0.76–1.27)
Globulin, Total: 1.7 g/dL (ref 1.5–4.5)
Glucose: 100 mg/dL — ABNORMAL HIGH (ref 70–99)
Potassium: 4.7 mmol/L (ref 3.5–5.2)
Sodium: 149 mmol/L — ABNORMAL HIGH (ref 134–144)
Total Protein: 6.3 g/dL (ref 6.0–8.5)
eGFR: 84 mL/min/{1.73_m2} (ref >59)

## 2023-04-01 LAB — LIPID PANEL W/O CHOL/HDL RATIO
Cholesterol, Total: 143 mg/dL (ref 100–199)
HDL: 52 mg/dL (ref >39)
Triglycerides: 157 mg/dL — ABNORMAL HIGH (ref 0–149)

## 2023-04-01 LAB — VITAMIN B12: Vitamin B-12: 946 pg/mL (ref 232–1245)

## 2023-04-01 NOTE — Progress Notes (Signed)
Contacted via MyChart   Good evening Hessie Diener, your labs have returned: - Kidney function, creatinine and eGFR, remains normal, as is liver function, AST and ALT.  - Sodium level a little elevated, I recommend increase water intake and reduce salt intake.  We will recheck this next visit as only mildly elevated at this time. - Cholesterol levels show LDL at goal, continue current medication.  Triglycerides a little elevated, focus on diet changes at home for this. - Remainder of labs stable, we will continue to monitor.  Continue all current medications.  Any questions? Keep being amazing!!  Thank you for allowing me to participate in your care.  I appreciate you. Kindest regards, Aubriee Szeto

## 2023-04-07 ENCOUNTER — Ambulatory Visit
Admission: RE | Admit: 2023-04-07 | Discharge: 2023-04-07 | Disposition: A | Discharge status: Home / Self Care | Payer: PPO | Source: Ambulatory Visit | Attending: Student in an Organized Health Care Education/Training Program | Admitting: Student in an Organized Health Care Education/Training Program

## 2023-04-07 ENCOUNTER — Ambulatory Visit
Payer: PPO | Attending: Student in an Organized Health Care Education/Training Program | Admitting: Student in an Organized Health Care Education/Training Program

## 2023-04-07 ENCOUNTER — Encounter: Payer: Self-pay | Admitting: Student in an Organized Health Care Education/Training Program

## 2023-04-07 VITALS — BP 154/69 | HR 55 | Temp 97.8°F | Resp 16 | Ht 74.0 in | Wt 225.0 lb

## 2023-04-07 DIAGNOSIS — M47816 Spondylosis without myelopathy or radiculopathy, lumbar region: Secondary | ICD-10-CM | POA: Insufficient documentation

## 2023-04-07 DIAGNOSIS — M5136 Other intervertebral disc degeneration, lumbar region: Secondary | ICD-10-CM | POA: Diagnosis not present

## 2023-04-07 MED ORDER — ROPIVACAINE HCL 2 MG/ML IJ SOLN
9.0000 mL | Freq: Once | INTRAMUSCULAR | Status: AC
  Administered 2023-04-07: 9 mL via PERINEURAL

## 2023-04-07 MED ORDER — LIDOCAINE HCL 2 % IJ SOLN
20.0000 mL | Freq: Once | INTRAMUSCULAR | Status: AC
  Administered 2023-04-07: 400 mg
  Filled 2023-04-07: qty 20

## 2023-04-07 MED ORDER — DEXAMETHASONE SODIUM PHOSPHATE 10 MG/ML IJ SOLN
INTRAMUSCULAR | Status: AC
  Filled 2023-04-07: qty 2

## 2023-04-07 MED ORDER — DEXAMETHASONE SODIUM PHOSPHATE 10 MG/ML IJ SOLN
10.0000 mg | Freq: Once | INTRAMUSCULAR | Status: AC
  Administered 2023-04-07: 10 mg

## 2023-04-07 MED ORDER — ROPIVACAINE HCL 2 MG/ML IJ SOLN
INTRAMUSCULAR | Status: AC
  Filled 2023-04-07: qty 20

## 2023-04-07 MED ORDER — LACTATED RINGERS IV SOLN: Freq: Once | INTRAVENOUS | Status: AC

## 2023-04-07 MED ORDER — MIDAZOLAM HCL 2 MG/2ML IJ SOLN
0.5000 mg | Freq: Once | INTRAMUSCULAR | Status: AC
  Administered 2023-04-07: 1 mg via INTRAVENOUS
  Filled 2023-04-07: qty 2

## 2023-04-07 NOTE — Progress Notes (Signed)
PROVIDER NOTE: Interpretation of information contained herein should be left to medically-trained personnel. Specific patient instructions are provided elsewhere under "Patient Instructions" section of medical record. This document was created in part using STT-dictation technology, any transcriptional errors that may result from this process are unintentional.  Patient: Christopher Huang Type: Established DOB: 01/22/1951 MRN: [id number] PCP: Marjie Skiff, NP  Service: Procedure DOS: 04/07/2023 Setting: Ambulatory Location: Ambulatory outpatient facility Delivery: Face-to-face Provider: Edward Jolly, MD Specialty: Interventional Pain Management Specialty designation: 09 Location: Outpatient facility Ref. Prov.: Marjie Skiff, NP       Interventional Therapy   Procedure: Lumbar Facet, Medial Branch Radiofrequency Ablation (RFA) #1  Laterality: Bilateral (-50)  Level: L2, L3, and L4 Medial Branch Level(s). These levels will denervate the L2-3 and L3-4 lumbar facet joints.  Imaging: Fluoroscopy-guided         Anesthesia: Local anesthesia (1-2% Lidocaine) Anxiolysis: IV Versed  DOS: 04/07/2023  Performed by: Edward Jolly, MD  Purpose: Therapeutic/Palliative Indications: Low back pain severe enough to impact quality of life or function. Indications: 1. Lumbar facet arthropathy   2. Lumbar degenerative disc disease    Christopher Huang has been dealing with the above chronic pain for longer than three months and has either failed to respond, was unable to tolerate, or simply did not get enough benefit from other more conservative therapies including, but not limited to: 1. Over-the-counter medications 2. Anti-inflammatory medications 3. Muscle relaxants 4. Membrane stabilizers 5. Opioids 6. Physical therapy and/or chiropractic manipulation 7. Modalities (Heat, ice, etc.) 8. Invasive techniques such as nerve blocks. Christopher Huang has attained more than 50% relief of the pain from a series  of diagnostic injections conducted in separate occasions.  Pain Score: Pre-procedure: 3 /10 Post-procedure: 0-No pain/10     Position / Prep / Materials:  Position: Prone  Prep solution: DuraPrep (Iodine Povacrylex [0.7% available iodine] and Isopropyl Alcohol, 74% w/w) Prep Area: Entire Lumbosacral Region (Lower back from mid-thoracic region to end of tailbone and from flank to flank.) Materials:  Tray: RFA (Radiofrequency) tray Needle(s):  Type: RFA (Teflon-coated radiofrequency ablation needles) Gauge (G): 20  Length: Regular (10cm) Qty: 2      Pre-op H&P Assessment:  Christopher Huang is a 72 y.o. (year old), male patient, seen today for interventional treatment. He  has a past surgical history that includes prostate cancer removed; Tonsillectomy; Knee arthroscopy; basal skin cancers; Tonsillectomy; Colonoscopy with propofol (N/A, 11/09/2017); Hand surgery; Total knee arthroplasty (Left, 02/09/2020); Eye surgery (Bilateral); Anterior lateral lumbar fusion with percutaneous screw 1 level (N/A, 01/29/2021); Cataract extraction, bilateral; and Colonoscopy with propofol (N/A, 12/01/2022). Christopher Huang has a current medication list which includes the following prescription(s): acetaminophen extra strength, aspirin, carvedilol, vitamin d3, coenzyme q10, docusate sodium, donepezil, empagliflozin, fexofenadine, fluticasone, misc natural products, multivitamin with minerals, rosuvastatin, sacubitril-valsartan, and cyanocobalamin. His primarily concern today is the Back Pain (Lumbar bilateral )  Initial Vital Signs:  Pulse/HCG Rate: 73  Temp: 97.8 F (36.6 C) Resp: 16 BP: (!) 140/102 SpO2: 99 %  BMI: Estimated body mass index is 28.89 kg/m as calculated from the following:   Height as of this encounter: 6\' 2"  (1.88 m).   Weight as of this encounter: 225 lb (102.1 kg).  Risk Assessment: Allergies: Reviewed. He is allergic to percocet [oxycodone-acetaminophen], tramadol, and vicodin  [hydrocodone-acetaminophen].  Allergy Precautions: None required Coagulopathies: Reviewed. None identified.  Blood-thinner therapy: None at this time Active Infection(s): Reviewed. None identified. Christopher Huang is afebrile  Site Confirmation: Christopher Huang  was asked to confirm the procedure and laterality before marking the site Procedure checklist: Completed Consent: Before the procedure and under the influence of no sedative(s), amnesic(s), or anxiolytics, the patient was informed of the treatment options, risks and possible complications. To fulfill our ethical and legal obligations, as recommended by the American Medical Association's Code of Ethics, I have informed the patient of my clinical impression; the nature and purpose of the treatment or procedure; the risks, benefits, and possible complications of the intervention; the alternatives, including doing nothing; the risk(s) and benefit(s) of the alternative treatment(s) or procedure(s); and the risk(s) and benefit(s) of doing nothing. The patient was provided information about the general risks and possible complications associated with the procedure. These may include, but are not limited to: failure to achieve desired goals, infection, bleeding, organ or nerve damage, allergic reactions, paralysis, and death. In addition, the patient was informed of those risks and complications associated to Spine-related procedures, such as failure to decrease pain; infection (i.e.: Meningitis, epidural or intraspinal abscess); bleeding (i.e.: epidural hematoma, subarachnoid hemorrhage, or any other type of intraspinal or peri-dural bleeding); organ or nerve damage (i.e.: Any type of peripheral nerve, nerve root, or spinal cord injury) with subsequent damage to sensory, motor, and/or autonomic systems, resulting in permanent pain, numbness, and/or weakness of one or several areas of the body; allergic reactions; (i.e.: anaphylactic reaction); and/or  death. Furthermore, the patient was informed of those risks and complications associated with the medications. These include, but are not limited to: allergic reactions (i.e.: anaphylactic or anaphylactoid reaction(s)); adrenal axis suppression; blood sugar elevation that in diabetics may result in ketoacidosis or comma; water retention that in patients with history of congestive heart failure may result in shortness of breath, pulmonary edema, and decompensation with resultant heart failure; weight gain; swelling or edema; medication-induced neural toxicity; particulate matter embolism and blood vessel occlusion with resultant organ, and/or nervous system infarction; and/or aseptic necrosis of one or more joints. Finally, the patient was informed that Medicine is not an exact science; therefore, there is also the possibility of unforeseen or unpredictable risks and/or possible complications that may result in a catastrophic outcome. The patient indicated having understood very clearly. We have given the patient no guarantees and we have made no promises. Enough time was given to the patient to ask questions, all of which were answered to the patient's satisfaction. Christopher Huang has indicated that he wanted to continue with the procedure. Attestation: I, the ordering provider, attest that I have discussed with the patient the benefits, risks, side-effects, alternatives, likelihood of achieving goals, and potential problems during recovery for the procedure that I have provided informed consent. Date  Time: 04/07/2023  8:52 AM   Pre-Procedure Preparation:  Monitoring: As per clinic protocol. Respiration, ETCO2, SpO2, BP, heart rate and rhythm monitor placed and checked for adequate function Safety Precautions: Patient was assessed for positional comfort and pressure points before starting the procedure. Time-out: I initiated and conducted the "Time-out" before starting the procedure, as per protocol. The  patient was asked to participate by confirming the accuracy of the "Time Out" information. Verification of the correct person, site, and procedure were performed and confirmed by me, the nursing staff, and the patient. "Time-out" conducted as per Joint Commission's Universal Protocol (UP.01.01.01). Time: 0959 Start Time: 0959 hrs.  Description of Procedure:          Laterality: See above. Levels:  See above. Safety Precautions: Aspiration looking for blood return was conducted prior to  all injections. At no point did we inject any substances, as a needle was being advanced. Before injecting, the patient was told to immediately notify me if he was experiencing any new onset of "ringing in the ears, or metallic taste in the mouth". No attempts were made at seeking any paresthesias. Safe injection practices and needle disposal techniques used. Medications properly checked for expiration dates. SDV (single dose vial) medications used. After the completion of the procedure, all disposable equipment used was discarded in the proper designated medical waste containers. Local Anesthesia: Protocol guidelines were followed. The patient was positioned over the fluoroscopy table. The area was prepped in the usual manner. The time-out was completed. The target area was identified using fluoroscopy. A 12-in long, straight, sterile hemostat was used with fluoroscopic guidance to locate the targets for each level blocked. Once located, the skin was marked with an approved surgical skin marker. Once all sites were marked, the skin (epidermis, dermis, and hypodermis), as well as deeper tissues (fat, connective tissue and muscle) were infiltrated with a small amount of a short-acting local anesthetic, loaded on a 10cc syringe with a 25G, 1.5-in  Needle. An appropriate amount of time was allowed for local anesthetics to take effect before proceeding to the next step. Technical description of process:  Radiofrequency Ablation  (RFA) L3 Medial Branch Nerve RFA: The target area for the L3 medial branch is at the junction of the postero-lateral aspect of the superior articular process and the superior, posterior, and medial edge of the transverse process of L4. Under fluoroscopic guidance, a Radiofrequency needle was inserted until contact was made with os over the superior postero-lateral aspect of the pedicular shadow (target area). Sensory and motor testing was conducted to properly adjust the position of the needle. Once satisfactory placement of the needle was achieved, the numbing solution was slowly injected after negative aspiration for blood. 2.0 mL of the nerve block solution was injected without difficulty or complication. After waiting for at least 3 minutes, the ablation was performed. Once completed, the needle was removed intact. L4 Medial Branch Nerve RFA: The target area for the L4 medial branch is at the junction of the postero-lateral aspect of the superior articular process and the superior, posterior, and medial edge of the transverse process of L5. Under fluoroscopic guidance, a Radiofrequency needle was inserted until contact was made with os over the superior postero-lateral aspect of the pedicular shadow (target area). Sensory and motor testing was conducted to properly adjust the position of the needle. Once satisfactory placement of the needle was achieved, the numbing solution was slowly injected after negative aspiration for blood. 2.0 mL of the nerve block solution was injected without difficulty or complication. After waiting for at least 3 minutes, the ablation was performed. Once completed, the needle was removed intact. L5 Medial Branch Nerve RFA: The target area for the L5 medial branch is at the junction of the postero-lateral aspect of the superior articular process of S1 and the superior, posterior, and medial edge of the sacral ala. Under fluoroscopic guidance, a Radiofrequency needle was inserted  until contact was made with os over the superior postero-lateral aspect of the pedicular shadow (target area). Sensory and motor testing was conducted to properly adjust the position of the needle. Once satisfactory placement of the needle was achieved, the numbing solution was slowly injected after negative aspiration for blood. 2.0 mL of the nerve block solution was injected without difficulty or complication. After waiting for at least 3 minutes,  the ablation was performed. Once completed, the needle was removed intact. Radiofrequency lesioning (ablation):  Radiofrequency Generator: Medtronic AccurianTM AG 1000 RF Generator Sensory Stimulation Parameters: 50 Hz was used to locate & identify the nerve, making sure that the needle was positioned such that there was no sensory stimulation below 0.3 V or above 0.7 V. Motor Stimulation Parameters: 2 Hz was used to evaluate the motor component. Care was taken not to lesion any nerves that demonstrated motor stimulation of the lower extremities at an output of less than 2.5 times that of the sensory threshold, or a maximum of 2.0 V. Lesioning Technique Parameters: Standard Radiofrequency settings. (Not bipolar or pulsed.) Temperature Settings: 80 degrees C Lesioning time: 60 seconds Intra-operative Compliance: Compliant  Once the entire procedure was completed, the treated area was cleaned, making sure to leave some of the prepping solution back to take advantage of its long term bactericidal properties.    Illustration of the posterior view of the lumbar spine and the posterior neural structures. Laminae of L2 through S1 are labeled. DPRL5, dorsal primary ramus of L5; DPRS1, dorsal primary ramus of S1; DPR3, dorsal primary ramus of L3; FJ, facet (zygapophyseal) joint L3-L4; I, inferior articular process of L4; LB1, lateral branch of dorsal primary ramus of L1; IAB, inferior articular branches from L3 medial branch (supplies L4-L5 facet joint); IBP,  intermediate branch plexus; MB3, medial branch of dorsal primary ramus of L3; NR3, third lumbar nerve root; S, superior articular process of L5; SAB, superior articular branches from L4 (supplies L4-5 facet joint also); TP3, transverse process of L3.  Facet Joint Innervation (* possible contribution)  L1-2 T12, L1 (L2*)  Medial Branch  L2-3 L1, L2 (L3*)         "          "  L3-4 L2, L3 (L4*)         "          "  L4-5 L3, L4 (L5*)         "          "  L5-S1 L4, L5, S1          "          "    Vitals:   04/07/23 1025 04/07/23 1030 04/07/23 1035 04/07/23 1043  BP: (!) 146/67 139/81 (!) 140/80 (!) 154/69  Pulse: 61 (!) 59 60 (!) 55  Resp: 17 16 16 16   Temp:      TempSrc:      SpO2: 100% 100% 99% 97%  Weight:      Height:        Start Time: 0959 hrs. End Time: 1031 hrs.  Imaging Guidance (Spinal):          Type of Imaging Technique: Fluoroscopy Guidance (Spinal) Indication(s): Assistance in needle guidance and placement for procedures requiring needle placement in or near specific anatomical locations not easily accessible without such assistance. Exposure Time: Please see nurses notes. Contrast: None used. Fluoroscopic Guidance: I was personally present during the use of fluoroscopy. "Tunnel Vision Technique" used to obtain the best possible view of the target area. Parallax error corrected before commencing the procedure. "Direction-depth-direction" technique used to introduce the needle under continuous pulsed fluoroscopy. Once target was reached, antero-posterior, oblique, and lateral fluoroscopic projection used confirm needle placement in all planes. Images permanently stored in EMR. Interpretation: No contrast injected. I personally interpreted the imaging intraoperatively. Adequate needle placement confirmed in multiple planes. Permanent images saved  into the patient's record.  Antibiotic Prophylaxis:   Anti-infectives (From admission, onward)    None       Indication(s): None identified  Post-operative Assessment:  Post-procedure Vital Signs:  Pulse/HCG Rate: (!) 55  Temp: 97.8 F (36.6 C) Resp: 16 BP: (!) 154/69 SpO2: 97 %  EBL: None  Complications: No immediate post-treatment complications observed by team, or reported by patient.  Note: The patient tolerated the entire procedure well. A repeat set of vitals were taken after the procedure and the patient was kept under observation following institutional policy, for this type of procedure. Post-procedural neurological assessment was performed, showing return to baseline, prior to discharge. The patient was provided with post-procedure discharge instructions, including a section on how to identify potential problems. Should any problems arise concerning this procedure, the patient was given instructions to immediately contact us, at any time, without hesitation. In any case, we plan to contact the patient by telephone for a follow-up status report regarding this interventional procedure.  Comments:  No additional relevant information.  Plan of Care (POC)  Orders:  Orders Placed This Encounter  Procedures   DG PAIN CLINIC C-ARM 1-60 MIN NO REPORT    Intraoperative interpretation by procedural physician at [facility].    Standing Status:   Standing    Number of Occurrences:   1    Order Specific Question:   Reason for exam:    Answer:   Assistance in needle guidance and placement for procedures requiring needle placement in or near specific anatomical locations not easily accessible without such assistance.     Medications ordered for procedure: Meds ordered this encounter  Medications   lidocaine (XYLOCAINE) 2 % (with pres) injection 400 mg   lactated ringers infusion   midazolam (VERSED) injection 0.5-2 mg    Make sure Flumazenil is available in the pyxis when using this medication. If oversedation occurs, administer 0.2 mg IV over 15 sec. If after 45 sec no  response, administer 0.2 mg again over 1 min; may repeat at 1 min intervals; not to exceed 4 doses (1 mg)   dexamethasone (DECADRON) injection 10 mg   dexamethasone (DECADRON) injection 10 mg   ropivacaine (PF) 2 mg/mL (0.2%) (NAROPIN) injection 9 mL   ropivacaine (PF) 2 mg/mL (0.2%) (NAROPIN) injection 9 mL   Medications administered: We administered lidocaine, lactated ringers, midazolam, dexamethasone, dexamethasone, ropivacaine (PF) 2 mg/mL (0.2%), and ropivacaine (PF) 2 mg/mL (0.2%).  See the medical record for exact dosing, route, and time of administration.  Follow-up plan:   Return in about 6 weeks (around 05/19/2023) for Post Procedure Evaluation, in person.      Recent Visits Date Type Provider Dept  03/22/23 Office Visit Edward Jolly, MD [facility]  02/17/23 Procedure visit Edward Jolly, MD [facility]  01/25/23 Office Visit Edward Jolly, MD [facility]  Showing recent visits within past 90 days and meeting all other requirements Today's Visits Date Type Provider Dept  04/07/23 Procedure visit Edward Jolly, MD [facility]  Showing today's visits and meeting all other requirements Future Appointments Date Type Provider Dept  05/19/23 Appointment Edward Jolly, MD [facility]  Showing future appointments within next 90 days and meeting all other requirements  Disposition: Discharge home  Discharge (Date  Time): 04/07/2023; 1050 hrs.   Primary Care Physician: Marjie Skiff, NP Location: [facility] Note by: Edward Jolly, MD (TTS technology used. I apologize for any typographical errors that  were not detected and corrected.) Date: 04/07/2023; Time: 1:25 PM  Disclaimer:  Medicine is not an Visual merchandiser. The only guarantee in medicine is that nothing is guaranteed. It is important to note that the decision to proceed with this intervention was based on the information collected from the  patient. The Data and conclusions were drawn from the patient's questionnaire, the interview, and the physical examination. Because the information was provided in large part by the patient, it cannot be guaranteed that it has not been purposely or unconsciously manipulated. Every effort has been made to obtain as much relevant data as possible for this evaluation. It is important to note that the conclusions that lead to this procedure are derived in large part from the available data. Always take into account that the treatment will also be dependent on availability of resources and existing treatment guidelines, considered by other Pain Management Practitioners as being common knowledge and practice, at the time of the intervention. For Medico-Legal purposes, it is also important to point out that variation in procedural techniques and pharmacological choices are the acceptable norm. The indications, contraindications, technique, and results of the above procedure should only be interpreted and judged by a Board-Certified Interventional Pain Specialist with extensive familiarity and expertise in the same exact procedure and technique.

## 2023-04-07 NOTE — Progress Notes (Signed)
Safety precautions to be maintained throughout the outpatient stay will include: orient to surroundings, keep bed in low position, maintain call bell within reach at all times, provide assistance with transfer out of bed and ambulation.  

## 2023-04-07 NOTE — Patient Instructions (Signed)

## 2023-04-08 ENCOUNTER — Telehealth: Payer: Self-pay | Admitting: *Deleted

## 2023-04-08 NOTE — Telephone Encounter (Signed)
Spoke with patient's wife, she reports no problems post procedure. °

## 2023-04-16 ENCOUNTER — Other Ambulatory Visit: Payer: Self-pay | Admitting: Nurse Practitioner

## 2023-04-16 DIAGNOSIS — J302 Other seasonal allergic rhinitis: Secondary | ICD-10-CM

## 2023-04-16 NOTE — Telephone Encounter (Signed)
Requested Prescriptions  Pending Prescriptions Disp Refills   fluticasone (FLONASE) 50 MCG/ACT nasal spray [Pharmacy Med Name: FLUTICASONE PROP 50 MCG SPRAY] 48 mL 3    Sig: PLACE 2 SPRAYS INTO EACH NOSTRIL DAILY.     Ear, Nose, and Throat: Nasal Preparations - Corticosteroids Passed - 04/16/2023 10:06 AM      Passed - Valid encounter within last 12 months    Recent Outpatient Visits           2 weeks ago Frontotemporal dementia (HCC)   [place] [facility] [place], [place] T, NP   5 months ago Frontotemporal dementia (HCC)   [place] [facility] [place], Dorie Rank, NP   10 months ago Sem[facility] dementia [facility])   [place] [facility] [place], Corrie Dandy T, NP   11 months ago Upper respiratory tract infection, unspecified type   [place] [facility] Vigg, [place], MD   1 year ago Primary hypertension   [place] [facility] [place], Corrie Dandy T, NP       Future Appointments             In 1 month Brion Aliment, Dois [place], NP [facility] [place]   In 5 months [place], Dorie Rank, NP  [facility]

## 2023-04-30 ENCOUNTER — Telehealth: Payer: PPO

## 2023-04-30 DIAGNOSIS — M1711 Unilateral primary osteoarthritis, right knee: Secondary | ICD-10-CM | POA: Diagnosis not present

## 2023-05-19 ENCOUNTER — Ambulatory Visit
Payer: PPO | Attending: Student in an Organized Health Care Education/Training Program | Admitting: Student in an Organized Health Care Education/Training Program

## 2023-05-19 ENCOUNTER — Encounter: Payer: Self-pay | Admitting: Student in an Organized Health Care Education/Training Program

## 2023-05-19 VITALS — BP 113/58 | HR 70 | Temp 97.0°F | Ht 73.0 in | Wt 219.0 lb

## 2023-05-19 DIAGNOSIS — M5134 Other intervertebral disc degeneration, thoracic region: Secondary | ICD-10-CM | POA: Diagnosis not present

## 2023-05-19 DIAGNOSIS — M47816 Spondylosis without myelopathy or radiculopathy, lumbar region: Secondary | ICD-10-CM | POA: Diagnosis not present

## 2023-05-19 DIAGNOSIS — M48061 Spinal stenosis, lumbar region without neurogenic claudication: Secondary | ICD-10-CM | POA: Insufficient documentation

## 2023-05-19 DIAGNOSIS — M961 Postlaminectomy syndrome, not elsewhere classified: Secondary | ICD-10-CM | POA: Insufficient documentation

## 2023-05-19 DIAGNOSIS — M5136 Other intervertebral disc degeneration, lumbar region: Secondary | ICD-10-CM | POA: Insufficient documentation

## 2023-05-19 NOTE — Progress Notes (Signed)
PROVIDER NOTE: Information contained herein reflects review and annotations entered in association with encounter. Interpretation of such information and data should be left to medically-trained personnel. Information provided to patient can be located elsewhere in the medical record under "Patient Instructions". Document created using STT-dictation technology, any transcriptional errors that may result from process are unintentional.    Patient: Christopher Huang  Service Category: E/M  Provider: Edward Jolly, MD  DOB: 06-15-1951  DOS: 05/19/2023  Referring Provider: Marjie Skiff, NP  MRN: [id number]  Specialty: Interventional Pain Management  PCP: Marjie Skiff, NP  Type: Established Patient  Setting: Ambulatory outpatient    Location: Office  Delivery: Face-to-face     HPI  Mr. DREWEY BEGUE, a 72 y.o. year old male, is here today because of his Failed back surgical syndrome [M96.1]. Mr. Valdes primary complain today is Back Pain (lower)  Pertinent problems: Mr. Maret has Lumbar facet arthropathy; Spinal stenosis, lumbar region, without neurogenic claudication (L3/4); and Lumbar degenerative disc disease on their pertinent problem list. Pain Assessment: Severity of Chronic pain is reported as a 5 /10. Location: Back Lower/denies. Onset: More than a month ago. Quality: Aching, Constant. Timing: Constant. Modifying factor(s): TENS, meds. Vitals:  height is 6\' 1"  (1.854 m) and weight is 219 lb (99.3 kg). His temporal temperature is 97 F (36.1 C) (abnormal). His blood pressure is 113/58 (abnormal) and his pulse is 70. His oxygen saturation is 98%.  BMI: Estimated body mass index is 28.89 kg/m as calculated from the following:   Height as of this encounter: 6\' 1"  (1.854 m).   Weight as of this encounter: 219 lb (99.3 kg). Last encounter: 03/22/2023. Last procedure: 04/07/2023.  Reason for encounter: post-procedure evaluation and assessment.   Unfortunately, Hessie Diener did not receive  significant benefit with his lumbar radiofrequency ablation.  Continues to endorse low back pain that is constant and limits his ability to perform ADLs.    ROS  Constitutional: Denies any fever or chills Gastrointestinal: No reported hemesis, hematochezia, vomiting, or acute GI distress Musculoskeletal:  Low back pain Neurological: No reported episodes of acute onset apraxia, aphasia, dysarthria, agnosia, amnesia, paralysis, loss of coordination, or loss of consciousness  Medication Review  Acetaminophen Extra Strength, Aspirin, Coenzyme Q10, Misc Natural Products, Vitamin D3, carvedilol, cyanocobalamin, docusate sodium, donepezil, empagliflozin, fexofenadine, fluticasone, multivitamin with minerals, rosuvastatin, and sacubitril-valsartan  History Review  Allergy: Mr. Vi is allergic to percocet [oxycodone-acetaminophen], tramadol, and vicodin [hydrocodone-acetaminophen]. Drug: Mr. Sattar  reports no history of drug use. Alcohol:  reports no history of alcohol use. Tobacco:  reports that he has never smoked. He has never used smokeless tobacco. Social: Mr. Aycock  reports that he has never smoked. He has never used smokeless tobacco. He reports that he does not drink alcohol and does not use drugs. Medical:  has a past medical history of Allergic rhinitis, Cancer of prostate (HCC) (02/2007), Dementia (HCC), History of kidney stones, Hyperlipidemia, Hypertension, and Personal history of kidney stones. Surgical: Mr. Mcclenny  has a past surgical history that includes prostate cancer removed; Tonsillectomy; Knee arthroscopy; basal skin cancers; Tonsillectomy; Colonoscopy with propofol (N/A, 11/09/2017); Hand surgery; Total knee arthroplasty (Left, 02/09/2020); Eye surgery (Bilateral); Anterior lateral lumbar fusion with percutaneous screw 1 level (N/A, 01/29/2021); Cataract extraction, bilateral; and Colonoscopy with propofol (N/A, 12/01/2022). Family: family history includes Dementia in his mother;  Prostate cancer in his father.  Laboratory Chemistry Profile   Renal Lab Results  Component Value Date   BUN 12 03/30/2023  CREATININE 0.96 03/30/2023   BCR 13 03/30/2023   GFRAA 103 08/16/2020   GFRNONAA >60 12/03/2020    Hepatic Lab Results  Component Value Date   AST 22 03/30/2023   ALT 13 03/30/2023   ALBUMIN 4.6 03/30/2023   ALKPHOS 93 03/30/2023    Electrolytes Lab Results  Component Value Date   NA 149 (H) 03/30/2023   K 4.7 03/30/2023   CL 106 03/30/2023   CALCIUM 8.4 (L) 03/30/2023    Bone Lab Results  Component Value Date   VD25OH 50.4 03/30/2023    Inflammation (CRP: Acute Phase) (ESR: Chronic Phase) No results found for: "CRP", "ESRSEDRATE", "LATICACIDVEN"       Note: Above Lab results reviewed.  CLINICAL DATA:  Lumbar radiculopathy, prior surgery, new symptoms. Patient reports surgery 5 years ago with 2 years of progressive central low back pain.   EXAM: MRI LUMBAR SPINE WITHOUT CONTRAST   TECHNIQUE: Multiplanar, multisequence MR imaging of the lumbar spine was performed. No intravenous contrast was administered.   COMPARISON:  MRI of lumbar spine 06/20/2020. Flexion extension lumbar spine radiographs 10/29/2022.   FINDINGS: Segmentation:  Partial lumbarization of S1 again noted.   Alignment: Anterolisthesis at L4-5 is partially reduced in surgery. Slight anterolisthesis at L3-4 and slight retrolisthesis at L2-3 is stable. Rightward curvature is centered at L3-4.   Vertebrae: Chronic fatty endplate marrow changes are again noted at L2-3, right greater than left. Hemangioma is present on the left at L3. Marrow signal and vertebral body heights are otherwise normal.   Conus medullaris and cauda equina: Conus extends to the T12 level. Conus and cauda equina appear normal.   Paraspinal and other soft tissues: Limited imaging the abdomen is unremarkable. There is no significant adenopathy. No solid organ lesions are present.   Disc  levels:   L1-2: Mild facet hypertrophy present bilaterally. No significant disc protrusion or stenosis is present.   L2-3: Chronic loss of disc height is present. Moderate facet hypertrophy is stable. Mild crowding of nerve roots centrally is similar the prior exam. Moderate foraminal stenosis is stable, right greater than left.   L3-4: Progressive adjacent level disease is present. A broad-based disc protrusion is present. Progressive advanced facet hypertrophy is noted. This results in moderate to severe central canal stenosis with crowding of the nerve roots and left greater than right subarticular stenosis. Mild foraminal narrowing bilaterally has progressed.   L4-5: Laminectomy and fusion noted. The central canal and foramina are decompressed. No residual or recurrent stenosis is present.   L5-S1: A leftward disc protrusion extends into the neural foramen. Endplate spurring is also present. Some facet spurring is noted. The central canal is patent. Moderate left neural foraminal stenosis is stable.   S1-2: No significant disc protrusion or stenosis is present.   IMPRESSION: 1. Laminectomy and fusion at L4-5 without residual or recurrent stenosis. 2. Progressive adjacent level disease at L3-4 with moderate to severe central canal stenosis, crowding of the nerve roots, and left greater than right subarticular stenosis. 3. Mild foraminal narrowing bilaterally at L3-4 has progressed. 4. Moderate left neural foraminal stenosis at L5-S1 is stable. 5. Chronic loss of disc height and moderate facet hypertrophy at L2-3 with mild crowding of the nerve roots centrally and moderate foraminal stenosis bilaterally, right greater than left.     Electronically Signed   By: Marin Roberts M.D.   On: 11/14/2022 17:20    Physical Exam  General appearance: Well nourished, well developed, and well hydrated. In no apparent  acute distress Mental status: Alert, oriented x 3 (person,  place, & time)       Respiratory: No evidence of acute respiratory distress Eyes: PERLA Vitals: BP (!) 113/58   Pulse 70   Temp (!) 97 F (36.1 C) (Temporal)   Ht 6\' 1"  (1.854 m)   Wt 219 lb (99.3 kg)   SpO2 98%   BMI 28.89 kg/m  BMI: Estimated body mass index is 28.89 kg/m as calculated from the following:   Height as of this encounter: 6\' 1"  (1.854 m).   Weight as of this encounter: 219 lb (99.3 kg). Ideal: Ideal body weight: 79.9 kg (176 lb 2.4 oz) Adjusted ideal body weight: 87.7 kg (193 lb 4.6 oz)  Lumbar Spine Area Exam  Skin & Axial Inspection: Well healed scar from previous spine surgery detected Alignment: Symmetrical Functional ROM: pain restricted ROM       Stability: No instability detected Muscle Tone/Strength: Functionally intact. No obvious neuro-muscular anomalies detected. Sensory (Neurological): Neurogenic Palpation: No palpable anomalies       Provocative Tests: Hyperextension/rotation test: (+) bilaterally for facet joint pain. Lumbar quadrant test (Kemp's test): (+) bilaterally for facet joint pain. Lateral bending test: Unable to perform due to fusion restriction.  Assessment   Diagnosis Status  1. Failed back surgical syndrome   2. Lumbar facet arthropathy   3. Lumbar degenerative disc disease   4. Spinal stenosis, lumbar region, without neurogenic claudication (L3/4)   5. Other intervertebral disc degeneration, thoracic region    Persistent Persistent Persistent   Updated Problems: No problems updated.  Plan of Care  I discussed  percutaneous spinal cord stimulator trial with the patient in detail. I explained to the patient that they will have an external power source and programmer which the patient will use for 7 days. There will be daily communication with the stimulator company and the patient. A possible need for a mid-trial clinic visit to give the patient the best chance of success.   Patient will need to have a thorough psychosocial  behavioral evaluation. Our office will be happy to help facilitate this. [employer] brochure and instructions given.  Patient will also need a thoracic MRI to rule out thoracic canal stenosis for spinal cord stimulator lead planning.  Some of patient's pain does seem to be mechanical in nature, with some component of neurogenic pain as well. We discussed the indications for spinal cord stimulation, specifically stating that it is typically better for neuropathic and appendicular pain, but that we have had some success in the treatment of low back and hip pain.   Patient is interested in proceeding with spinal cord stimulation trial. He understands that this may not be successful, and that spinal cord stimulation in general is not a "magic bullet."   We had a lengthy and very detailed discussion of all the risks, benefits, alternatives, and rationale of surgery as well as the option of continuing nonsurgical therapies. We specifically discussed the risks of temporary or permanent worsened neurologic injury, no symptomatic relief or pain made worse after procedure, and also the need for future surgery (due to infection, CSF leak, bleeding, adjacent segment issues, bone-healing difficulties, and other related issues). No guarantees of outcome were made or implied and he is eager to proceed and presents for definitive treatment.  Hessie Diener told me that all of his questions were answered thoroughly and to his satisfaction. Confidence and understanding of the discussed risks and consequences of  treatment was expressed and he accepted these  risks and was eager to proceed with procedure.   Issues concerning treatment and diagnosis were discussed with the patient. There are no barriers to understanding the plan of treatment. Explanation was well received by patient and/or family who then verbalized understanding.    Orders:  Orders Placed This Encounter  Procedures   MR THORACIC SPINE WO CONTRAST    Patient  presents with axial pain with possible radicular component. Please assist [place] in identifying specific level(s) and laterality of any additional findings such as: 1. Facet (Zygapophyseal) joint DJD (Hypertrophy, space narrowing, subchondral sclerosis, and/or osteophyte formation) 2. DDD and/or IVDD (Loss of disc height, desiccation, gas patterns, osteophytes, endplate sclerosis, or "Black disc disease") 3. Pars defects 4. Spondylolisthesis, spondylosis, and/or spondyloarthropathies (include Degree/Grade of displacement in mm) (stability) 5. Vertebral body Fractures (acute/chronic) (state percentage of collapse) 6. Demineralization (osteopenia/osteoporotic) 7. Bone pathology 8. Foraminal narrowing  9. Surgical changes 10. Central, Lateral Recess, and/or Foraminal Stenosis (include AP diameter of stenosis in mm) 11. Surgical changes (hardware type, status, and presence of fibrosis) 12. Modic Type Changes (MRI only) 13. IVDD (Disc bulge, protrusion, herniation, extrusion) (Level, laterality, extent)    Standing Status:   Future    Standing Expiration Date:   06/18/2023    Scheduling Instructions:     Please make sure that the patient understands that this needs to be done as soon as possible. Never have the patient do the imaging "just before the next appointment". Inform patient that having the imaging done within the [facility] will expedite the availability of the results and will provide      imaging availability to the requesting physician. In addition inform the patient that the imaging order has an expiration date and will not be renewed if not done within the active period.    Order Specific Question:   What is the patient's sedation requirement?    Answer:   No Sedation    Order Specific Question:   Does the patient have a pacemaker or implanted devices?    Answer:   No    Order Specific Question:   Preferred imaging location?    Answer:   [facility] (table limit-350lbs)     Order Specific Question:   Call Results- Best Contact Number?    Answer:   [phone] [facility])    Order Specific Question:   Radiology Contrast Protocol - do NOT remove file path    Answer:   [email or web address]   Follow-up plan:   Return for [employer] trial , ECT.     Recent Visits Date Type Provider Dept  04/07/23 Procedure visit Edward Jolly, MD [facility]  03/22/23 Office Visit Edward Jolly, MD [facility]  Showing recent visits within past 90 days and meeting all other requirements Today's Visits Date Type Provider Dept  05/19/23 Office Visit Edward Jolly, MD [facility]  Showing today's visits and meeting all other requirements Future Appointments No visits were found meeting these conditions. Showing future appointments within next 90 days and meeting all other requirements  I discussed the assessment and treatment plan with the patient. The patient was provided an opportunity to ask questions and all were answered. The patient agreed with the plan and demonstrated an understanding of the instructions.  Patient advised to call back or seek an in-person evaluation if the symptoms or condition worsens.  Duration of encounter: .  Total time on encounter, as per AMA guidelines included both the face-to-face and non-face-to-face  time personally spent by the physician and/or other qualified health care professional(s) on the day of the encounter (includes time in activities that require the physician or other qualified health care professional and does not include time in activities normally performed by clinical staff). Physician's time may include the following activities when performed: Preparing to see the patient (e.g., pre-charting review of records, searching for previously ordered imaging, lab work, and nerve conduction tests) Review of prior analgesic pharmacotherapies. Reviewing  PMP Interpreting ordered tests (e.g., lab work, imaging, nerve conduction tests) Performing post-procedure evaluations, including interpretation of diagnostic procedures Obtaining and/or reviewing separately obtained history Performing a medically appropriate examination and/or evaluation Counseling and educating the patient/family/caregiver Ordering medications, tests, or procedures Referring and communicating with other health care professionals (when not separately reported) Documenting clinical information in the electronic or other health record Independently interpreting results (not separately reported) and communicating results to the patient/ family/caregiver Care coordination (not separately reported)  Note by: Edward Jolly, MD Date: 05/19/2023; Time: 3:11 PM

## 2023-05-19 NOTE — Progress Notes (Signed)
Safety precautions to be maintained throughout the outpatient stay will include: orient to surroundings, keep bed in low position, maintain call bell within reach at all times, provide assistance with transfer out of bed and ambulation.  

## 2023-05-21 ENCOUNTER — Other Ambulatory Visit: Payer: Self-pay | Admitting: Nurse Practitioner

## 2023-05-24 NOTE — Telephone Encounter (Signed)
Requested by interface surescripts. Last labs 03/30/23. Future visit in 4 months. Last refill prior to OV.  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[facility])   [place] [facility] [place], Corrie Dandy T, NP   1 year ago Upper respiratory tract infection, unspecified type   [place] [facility] [place], Avanti, MD   1 year ago Primary hypertension   [place] [facility] [place], Corrie Dandy T, NP       Future Appointments             In 1 week [place] Aliment, Dois Davenport, NP [facility] [place]   In 4 months [place], [place] T, NP [place] [facility]             rosuvastatin (CRESTOR) 40 MG tablet [Pharmacy Med Name: ROSUVASTATIN CALCIUM 40 MG TAB] 90 tablet 0    Sig: TAKE 1 TABLET (40 MG TOTAL) BY MOUTH DAILY. STOP TAKING 20 MG TABLETS.     Cardiovascular:  Antilipid - Statins 2 Failed - 05/21/2023  2:23 AM      Failed - Lipid Panel in normal range within the last 12 months    Cholesterol, Total  Date Value Ref Range Status  03/30/2023 143 100 - 199 mg/dL Final   Cholesterol Piccolo, Waived  Date Value Ref Range Status  07/15/2017 185 <200 mg/dL Final    Comment:                            Desirable                <200                         Borderline High      200- 239                         High                     >239    LDL Chol Calc (NIH)   Date Value Ref Range Status  03/30/2023 64 0 - 99 mg/dL Final   HDL  Date Value Ref Range Status  03/30/2023 52 >39 mg/dL Final   Triglycerides  Date Value Ref Range Status  03/30/2023 157 (H) 0 - 149 mg/dL Final   Triglycerides Piccolo,Waived  Date Value Ref Range Status  07/15/2017 223 (H) <150 mg/dL Final    Comment:                            Normal                   <  150                         Borderline High     150 - 199                         High                200 - 499                         Very High                >499          Passed - Cr in normal range and within 360 days    Creatinine, Ser  Date Value Ref Range Status  03/30/2023 0.96 0.76 - 1.27 mg/dL Final         Passed - Patient is not pregnant      Passed - Valid encounter within last 12 months    Recent Outpatient Visits           1 month ago Frontotemporal dementia (HCC)   [place] [facility] [place], Corrie Dandy T, NP   6 months ago Frontotemporal dementia (HCC)   [place] [facility] [place], Dorie Rank, NP   12 months ago 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 [facility] [place], Corrie Dandy T, NP       Future Appointments             In 1 week Brion Aliment, Dois Davenport, NP [facility] [place]   In 4 months [place], Dorie Rank, NP [place] [facility]

## 2023-05-28 ENCOUNTER — Ambulatory Visit
Admission: RE | Admit: 2023-05-28 | Discharge: 2023-05-28 | Disposition: A | Discharge status: Home / Self Care | Payer: PPO | Source: Ambulatory Visit | Attending: Student in an Organized Health Care Education/Training Program | Admitting: Student in an Organized Health Care Education/Training Program

## 2023-05-28 DIAGNOSIS — M549 Dorsalgia, unspecified: Secondary | ICD-10-CM | POA: Diagnosis not present

## 2023-05-28 DIAGNOSIS — M47814 Spondylosis without myelopathy or radiculopathy, thoracic region: Secondary | ICD-10-CM | POA: Diagnosis not present

## 2023-05-28 DIAGNOSIS — M5124 Other intervertebral disc displacement, thoracic region: Secondary | ICD-10-CM | POA: Diagnosis not present

## 2023-05-28 DIAGNOSIS — M5134 Other intervertebral disc degeneration, thoracic region: Secondary | ICD-10-CM

## 2023-05-28 DIAGNOSIS — M5023 Other cervical disc displacement, cervicothoracic region: Secondary | ICD-10-CM | POA: Diagnosis not present

## 2023-06-03 ENCOUNTER — Ambulatory Visit: Payer: PPO | Admitting: Nurse Practitioner

## 2023-06-08 ENCOUNTER — Other Ambulatory Visit: Payer: Self-pay | Admitting: Student in an Organized Health Care Education/Training Program

## 2023-06-08 DIAGNOSIS — M48061 Spinal stenosis, lumbar region without neurogenic claudication: Secondary | ICD-10-CM

## 2023-06-08 DIAGNOSIS — M961 Postlaminectomy syndrome, not elsewhere classified: Secondary | ICD-10-CM

## 2023-06-14 ENCOUNTER — Encounter: Payer: Self-pay | Admitting: Cardiovascular Disease

## 2023-06-16 DIAGNOSIS — F4542 Pain disorder with related psychological factors: Secondary | ICD-10-CM | POA: Diagnosis not present

## 2023-06-17 NOTE — Addendum Note (Signed)
Addended by: Edward Jolly on: 06/17/2023 12:08 PM   Modules accepted: Orders

## 2023-06-19 ENCOUNTER — Encounter: Payer: Self-pay | Admitting: Student in an Organized Health Care Education/Training Program

## 2023-06-21 ENCOUNTER — Ambulatory Visit: Payer: PPO | Admitting: Psychiatry

## 2023-06-22 ENCOUNTER — Telehealth: Payer: Self-pay | Admitting: Student in an Organized Health Care Education/Training Program

## 2023-06-22 NOTE — Telephone Encounter (Signed)
Patient had both the MRI and Psych Eval done for SCS Trial. Wanted to let you know so we could set up his SCS Trial.

## 2023-07-11 NOTE — Progress Notes (Unsigned)
Cardiology Office Note  Date:  07/12/2023   ID:  Christopher Huang, DOB 03-20-1951, MRN [id number]  PCP:  Marjie Skiff, NP   Chief Complaint  Patient presents with   Follow-up    3 mo f/u, pt scheduled for spinal cord stimulator 8/21 for chronic back pain, meds reviewed.    HPI:  [name] is a 72 yo male with PMH of  Memory loss HTN Hyperlipidemia Left bundle branch block Cardiomyopathy, ejection fraction 45 to 50% Who presents for follow-up of his sinus bradycardia with LBBB, ejection fraction 30 to 35% in 8/23, NICM  Last seen in clinic April 2024 Followed by pain clinic for chronic low back pain Discussions initiated for percutaneous spinal cord stimulator trial  Difficulty affording Jardiance and Entresto, declining patient assistance  Remains active, goes to the gym, uses elliptical Denies significant shortness of breath, no PND orthopnea Tolerate carvedilol and Entresto on Aricept for memory  Lab work reviewed Total cholesterol 143 LDL 64 Creatinine 0.96 potassium 4.7  EKG personally reviewed by myself on todays visit EKG Interpretation Date/Time:  Monday July 12 2023 08:33:44 EDT Ventricular Rate:  65 PR Interval:  218 QRS Duration:  162 QT Interval:  452 QTC Calculation: 470 R Axis:   -17  Text Interpretation: Sinus rhythm with 1st degree A-V block with occasional Premature ventricular complexes Left bundle branch block When compared with ECG of 03-Dec-2020 08:42, Premature ventricular complexes are now Present Premature atrial complexes are no longer Present Left bundle branch block is now Present Confirmed by Julien Nordmann [phone]) on 07/12/2023 8:56:14 AM    Other past medical history reviewed Cardiac CTA 1.  Coronary calcium score of 0. 2.  Normal coronary origin with right dominance. 3. Nonobstructive CAD with mixed plaque in proximal LAD causing minimal (0-24%) stenosis 4.  Mild aortic valve calcifications (AV calcium score 224) 5.   Mild mitral annular calcifications 6.  Dilated ascending aorta measuring 39mm  Echo 8/23 1. Left ventricular ejection fraction, by estimation, is 30 to 35%. The  left ventricle has moderate to severely decreased function. The left  ventricle demonstrates global hypokinesis. The left ventricular internal  cavity size was mildly dilated. Left  ventricular diastolic parameters are consistent with Grade I diastolic  dysfunction (impaired relaxation).   2. Right ventricular systolic function is low normal. The right  ventricular size is normal.   3. The mitral valve is normal in structure. No evidence of mitral valve  regurgitation.   4. The aortic valve is tricuspid. Aortic valve regurgitation is mild.  Aortic valve sclerosis is present, with no evidence of aortic valve  stenosis.   Stress test ordered May 19, 2022 mildly depressed ejection fraction 45 to 50%, no significant ischemia noted Calcification noted in left main, aortic atherosclerosis  MR brain 1. Asymmetric left temporal lobe atrophy, question symptoms of semantic dementia. 2. No reversible finding or evidence of recent injury.  Carotid ultrasound Mild carotid disease bilaterally   PMH:   has a past medical history of Allergic rhinitis, Cancer of prostate (HCC) (02/2007), Dementia (HCC), History of kidney stones, Hyperlipidemia, Hypertension, and Personal history of kidney stones.  PSH:    Past Surgical History:  Procedure Laterality Date   ANTERIOR LATERAL LUMBAR FUSION WITH PERCUTANEOUS SCREW 1 LEVEL N/A 01/29/2021   Procedure: L4-5 LATERAL INTERBODY FUSION, L4-5 POSTERIOR FUSION;  Surgeon: Venetia Night, MD;  Location: [facility] ORS;  Service: Neurosurgery;  Laterality: N/A;   basal skin cancers     CATARACT  EXTRACTION, BILATERAL     2021   COLONOSCOPY WITH PROPOFOL N/A 11/09/2017   Procedure: COLONOSCOPY WITH PROPOFOL;  Surgeon: Midge Minium, MD;  Location: [facility] ENDOSCOPY;  Service: Endoscopy;  Laterality: N/A;    COLONOSCOPY WITH PROPOFOL N/A 12/01/2022   Procedure: COLONOSCOPY WITH PROPOFOL;  Surgeon: Midge Minium, MD;  Location: [facility] ENDOSCOPY;  Service: Endoscopy;  Laterality: N/A;   EYE SURGERY Bilateral    Cataract   HAND SURGERY     THUMB SURGERY   KNEE ARTHROSCOPY     prostate cancer removed     TONSILLECTOMY     TONSILLECTOMY     TOTAL KNEE ARTHROPLASTY Left 02/09/2020   Procedure: TOTAL KNEE ARTHROPLASTY - RNFA;  Surgeon: Kennedy Bucker, MD;  Location: [facility] ORS;  Service: Orthopedics;  Laterality: Left;    Current Outpatient Medications  Medication Sig Dispense Refill   Acetaminophen (ACETAMINOPHEN EXTRA STRENGTH) 500 MG capsule Take 1 capsule by mouth every 6 (six) hours as needed for fever.     ASPIRIN 81 PO Take 81 mg by mouth daily.     carvedilol (COREG) 3.125 MG tablet Take 1 tablet (3.125 mg total) by mouth 2 (two) times daily. 180 tablet 3   Cholecalciferol (VITAMIN D3) 50 MCG (2000 UT) TABS Take 2,000 Units by mouth daily.     Coenzyme Q10 100 MG TABS Take 100 mg by mouth daily.     docusate sodium (COLACE) 100 MG capsule Take 100 mg by mouth daily.     donepezil (ARICEPT) 5 MG tablet TAKE 1 TABLET BY MOUTH EVERYDAY AT BEDTIME 90 tablet 0   fexofenadine (ALLEGRA ALLERGY) 180 MG tablet Take 1 tablet (180 mg total) by mouth daily. 10 tablet 1   fluticasone (FLONASE) 50 MCG/ACT nasal spray PLACE 2 SPRAYS INTO EACH NOSTRIL DAILY. 48 mL 3   Misc Natural Products (RELAX & SLEEP PO) Take by mouth. Reports taking "Relaxium" nightly for sleep (contains 5mg  Melatonin, 100mg  Magnesium, L-Tryptophan, Valerest, Ashwagandha, 100mg  GABA, Chamomile and Passionflower)     Multiple Vitamin (MULTIVITAMIN WITH MINERALS) TABS tablet Take 1 tablet by mouth daily. Men's Multivitamin 50+     rosuvastatin (CRESTOR) 40 MG tablet TAKE 1 TABLET (40 MG TOTAL) BY MOUTH DAILY. STOP TAKING 20 MG TABLETS. 90 tablet 0   valsartan (DIOVAN) 160 MG tablet Take 1 tablet (160 mg total) by mouth daily. 90 tablet 3    vitamin B-12 (CYANOCOBALAMIN) 500 MCG tablet Take 500 mcg by mouth daily.     No current facility-administered medications for this visit.    Allergies:   Percocet [oxycodone-acetaminophen], Tramadol, and Vicodin [hydrocodone-acetaminophen]   Social History:  The patient  reports that he has never smoked. He has never used smokeless tobacco. He reports that he does not drink alcohol and does not use drugs.   Family History:   family history includes Dementia in his mother; Prostate cancer in his father.    Review of Systems: Review of Systems  Constitutional: Negative.   HENT: Negative.    Respiratory: Negative.    Cardiovascular: Negative.   Gastrointestinal: Negative.   Musculoskeletal:  Positive for back pain and joint pain.  Neurological: Negative.   Psychiatric/Behavioral: Negative.    All other systems reviewed and are negative.   PHYSICAL EXAM: VS:  BP (!) 116/58 (BP Location: Left Arm, Patient Position: Sitting, Cuff Size: Normal)   Pulse 65   Ht 6\' 2"  (1.88 m)   Wt 219 lb (99.3 kg)   SpO2 90%   BMI 28.12 kg/m  ,  BMI Body mass index is 28.12 kg/m. Constitutional:  oriented to person, place, and time. No distress.  HENT:  Head: Grossly normal Eyes:  no discharge. No scleral icterus.  Neck: No JVD, no carotid bruits  Cardiovascular: Regular rate and rhythm, no murmurs appreciated Pulmonary/Chest: Clear to auscultation bilaterally, no wheezes or rails Abdominal: Soft.  no distension.  no tenderness.  Musculoskeletal: Normal range of motion Neurological:  normal muscle tone. Coordination normal. No atrophy Skin: Skin warm and dry Psychiatric: normal affect, pleasant  Recent Labs: 03/30/2023: ALT 13; BUN 12; Creatinine, Ser 0.96; Hemoglobin 14.8; Platelets 208; Potassium 4.7; Sodium 149; TSH 1.810    Lipid Panel Lab Results  Component Value Date   CHOL 143 03/30/2023   HDL 52 03/30/2023   LDLCALC 64 03/30/2023   TRIG 157 (H) 03/30/2023      Wt Readings  from Last 3 Encounters:  07/12/23 219 lb (99.3 kg)  05/19/23 219 lb (99.3 kg)  04/07/23 225 lb (102.1 kg)     ASSESSMENT AND PLAN:  Problem List Items Addressed This Visit       Cardiology Problems   Hyperlipidemia   Relevant Medications   valsartan (DIOVAN) 160 MG tablet   Hypertension   Relevant Medications   valsartan (DIOVAN) 160 MG tablet   LBBB (left bundle branch block)   Relevant Medications   valsartan (DIOVAN) 160 MG tablet   Other Visit Diagnoses     Coronary artery disease of native artery of native heart with stable angina pectoris (HCC)    -  Primary   Relevant Medications   valsartan (DIOVAN) 160 MG tablet   Other Relevant Orders   EKG 12-Lead (Completed)   Dilated cardiomyopathy (HCC)       Relevant Medications   valsartan (DIOVAN) 160 MG tablet   Angina pectoris (HCC)       Relevant Medications   valsartan (DIOVAN) 160 MG tablet      Dilated cardiomyopathy  Nonischemic cardiomyopathy, cardiac CTA with no significant stenosis Ejection fraction 30 to 35% on echo August 2023, nonischemic cardiomyopathy continue carvedilol 3.125 twice daily,  Previously on Entresto up to 49/51 mg twice daily, he is requesting a change Jardiance 10 mg daily, he is requesting a change secondary to price We will stop the Jardiance when he runs out given no alternative and he does not want patient assistance, Marcelline Deist would likely be similarly expensive We will change Entresto to valsartan 160 daily Repeat echo while on Coreg, Entresto, Jardiance  Hyperlipidemia On Crestor 40,  Cholesterol at goal  Essential hypertension Blood pressure is well controlled on today's visit. No changes made to the medications.  Carotid disease Minimal nonobstructive disease on ultrasound Cholesterol at goal  Memory loss on Aricept,  Recommend he continue regular walking program     Total encounter time more than 30 minutes  Greater than 50% was spent in counseling and coordination  of care with the patient    Signed, Dossie Arbour, M.D., Ph.D. [facility] [place], [place] [phone]

## 2023-07-12 ENCOUNTER — Encounter: Payer: Self-pay | Admitting: Cardiovascular Disease

## 2023-07-12 ENCOUNTER — Ambulatory Visit: Payer: PPO | Attending: Nurse Practitioner | Admitting: Cardiovascular Disease

## 2023-07-12 VITALS — BP 116/58 | HR 65 | Ht 74.0 in | Wt 219.0 lb

## 2023-07-12 DIAGNOSIS — E782 Mixed hyperlipidemia: Secondary | ICD-10-CM

## 2023-07-12 DIAGNOSIS — I447 Left bundle-branch block, unspecified: Secondary | ICD-10-CM

## 2023-07-12 DIAGNOSIS — I420 Dilated cardiomyopathy: Secondary | ICD-10-CM

## 2023-07-12 DIAGNOSIS — I10 Essential (primary) hypertension: Secondary | ICD-10-CM

## 2023-07-12 DIAGNOSIS — I428 Other cardiomyopathies: Secondary | ICD-10-CM | POA: Diagnosis not present

## 2023-07-12 DIAGNOSIS — I25118 Atherosclerotic heart disease of native coronary artery with other forms of angina pectoris: Secondary | ICD-10-CM | POA: Diagnosis not present

## 2023-07-12 DIAGNOSIS — I209 Angina pectoris, unspecified: Secondary | ICD-10-CM

## 2023-07-12 MED ORDER — EMPAGLIFLOZIN 10 MG PO TABS
10.0000 mg | ORAL_TABLET | Freq: Every day | ORAL | Status: DC
Start: 2023-07-12 — End: 2023-09-30

## 2023-07-12 MED ORDER — VALSARTAN 160 MG PO TABS: 160.0000 mg | ORAL_TABLET | Freq: Every day | ORAL | 3 refills | Status: DC

## 2023-07-12 MED ORDER — EMPAGLIFLOZIN 10 MG PO TABS
10.0000 mg | ORAL_TABLET | Freq: Every day | ORAL | 0 refills | Status: DC
Start: 2023-07-12 — End: 2023-07-12

## 2023-07-12 NOTE — Patient Instructions (Addendum)
Medication Instructions:  When jardiance runs out, ok to stop  When entresto runs out, change to valsartan   If you need a refill on your cardiac medications before your next appointment, please call your pharmacy.   Lab work: No new labs needed  Testing/Procedures: Echo for nonischemic cardiomyopathy   Your physician has requested that you have an echocardiogram. Echocardiography is a painless test that uses sound waves to create images of your heart. It provides your doctor with information about the size and shape of your heart and how well your heart's chambers and valves are working.   You may receive an ultrasound enhancing agent through an IV if needed to better visualize your heart during the echo. This procedure takes approximately one hour.  There are no restrictions for this procedure.  This will take place at [address] [address], [place] [address]    Follow-Up: At [facility], you and your health needs are our priority.  As part of our continuing mission to provide you with exceptional heart care, we have created designated Provider Care Teams.  These Care Teams include your primary Cardiologist (physician) and Advanced Practice Providers (APPs -  Physician Assistants and Nurse Practitioners) who all work together to provide you with the care you need, when you need it.  You will need a follow up appointment in 6 months  Providers on your designated Care Team:   Christopher Ducking, NP Christopher Listen, PA-C Christopher Huang, [place]  COVID-19 Vaccine Information can be found at: [email or web address] For questions related to vaccine distribution or appointments, please email [email or web address] or call [phone].

## 2023-07-14 ENCOUNTER — Ambulatory Visit
Payer: PPO | Attending: Student in an Organized Health Care Education/Training Program | Admitting: Student in an Organized Health Care Education/Training Program

## 2023-07-14 ENCOUNTER — Encounter: Payer: Self-pay | Admitting: Student in an Organized Health Care Education/Training Program

## 2023-07-14 ENCOUNTER — Ambulatory Visit: Admission: RE | Admit: 2023-07-14 | Payer: PPO | Source: Ambulatory Visit

## 2023-07-14 VITALS — BP 123/65 | HR 62 | Temp 97.2°F | Resp 16 | Ht 73.0 in | Wt 221.0 lb

## 2023-07-14 DIAGNOSIS — M961 Postlaminectomy syndrome, not elsewhere classified: Secondary | ICD-10-CM | POA: Diagnosis present

## 2023-07-14 DIAGNOSIS — M48061 Spinal stenosis, lumbar region without neurogenic claudication: Secondary | ICD-10-CM | POA: Insufficient documentation

## 2023-07-14 DIAGNOSIS — M47816 Spondylosis without myelopathy or radiculopathy, lumbar region: Secondary | ICD-10-CM | POA: Diagnosis not present

## 2023-07-14 MED ORDER — ROPIVACAINE HCL 2 MG/ML IJ SOLN
INTRAMUSCULAR | Status: AC
  Filled 2023-07-14: qty 20

## 2023-07-14 MED ORDER — CEFAZOLIN SODIUM-DEXTROSE 2-4 GM/100ML-% IV SOLN
2.0000 g | Freq: Once | INTRAVENOUS | Status: AC
  Administered 2023-07-14: 2 g via INTRAVENOUS

## 2023-07-14 MED ORDER — EPHEDRINE 5 MG/ML INJ
10.0000 mg | Freq: Once | INTRAVENOUS | Status: AC
  Administered 2023-07-14: 10 mg via INTRAVENOUS
  Filled 2023-07-14: qty 2

## 2023-07-14 MED ORDER — MIDAZOLAM HCL 5 MG/5ML IJ SOLN
INTRAMUSCULAR | Status: AC
  Filled 2023-07-14: qty 5

## 2023-07-14 MED ORDER — ROPIVACAINE HCL 2 MG/ML IJ SOLN
9.0000 mL | Freq: Once | INTRAMUSCULAR | Status: AC
  Administered 2023-07-14: 9 mL via PERINEURAL

## 2023-07-14 MED ORDER — GLYCOPYRROLATE 0.2 MG/ML IJ SOLN
0.2000 mg | Freq: Once | INTRAMUSCULAR | Status: AC
  Administered 2023-07-14: 0.2 mg via INTRAVENOUS

## 2023-07-14 MED ORDER — FENTANYL CITRATE (PF) 100 MCG/2ML IJ SOLN
INTRAMUSCULAR | Status: AC
  Filled 2023-07-14: qty 2

## 2023-07-14 MED ORDER — FENTANYL CITRATE (PF) 100 MCG/2ML IJ SOLN
25.0000 ug | INTRAMUSCULAR | Status: DC | PRN
  Administered 2023-07-14: 50 ug via INTRAVENOUS

## 2023-07-14 MED ORDER — LIDOCAINE HCL 2 % IJ SOLN
20.0000 mL | Freq: Once | INTRAMUSCULAR | Status: AC
  Administered 2023-07-14: 400 mg

## 2023-07-14 MED ORDER — CEFAZOLIN SODIUM 1 G IJ SOLR
INTRAMUSCULAR | Status: AC
  Filled 2023-07-14: qty 20

## 2023-07-14 MED ORDER — MIDAZOLAM HCL 5 MG/5ML IJ SOLN
0.5000 mg | Freq: Once | INTRAMUSCULAR | Status: AC
  Administered 2023-07-14: 0.5 mg via INTRAVENOUS

## 2023-07-14 MED ORDER — LACTATED RINGERS IV SOLN: Freq: Once | INTRAVENOUS | Status: AC

## 2023-07-14 MED ORDER — CEPHALEXIN 500 MG PO CAPS: 500.0000 mg | ORAL_CAPSULE | Freq: Four times a day (QID) | ORAL | 0 refills | Status: AC

## 2023-07-14 MED ORDER — LIDOCAINE HCL 2 % IJ SOLN
INTRAMUSCULAR | Status: AC
  Filled 2023-07-14: qty 20

## 2023-07-14 NOTE — Patient Instructions (Signed)
______________________________________________________________________    Post-Procedure Discharge Instructions  Instructions: Apply ice:  Purpose: This will minimize any swelling and discomfort after procedure.  When: Day of procedure, as soon as you get home. How: Fill a plastic sandwich bag with crushed ice. Cover it with a small towel and apply to injection site. How long: (15 min on, 15 min off) Apply for 15 minutes then remove x 15 minutes.  Repeat sequence on day of procedure, until you go to bed. Apply heat:  Purpose: To treat any soreness and discomfort from the procedure. When: Starting the next day after the procedure. How: Apply heat to procedure site starting the day following the procedure. How long: May continue to repeat daily, until discomfort goes away. Food intake: Start with clear liquids (like water) and advance to regular food, as tolerated.  Physical activities: Keep activities to a minimum for the first 8 hours after the procedure. After that, then as tolerated. Driving: If you have received any sedation, be responsible and do not drive. You are not allowed to drive for 24 hours after having sedation. Blood thinner: (Applies only to those taking blood thinners) You may restart your blood thinner 6 hours after your procedure. Insulin: (Applies only to Diabetic patients taking insulin) As soon as you can eat, you may resume your normal dosing schedule. Infection prevention: Keep procedure site clean and dry. Shower daily and clean area with soap and water. Post-procedure Pain Diary: Extremely important that this be done correctly and accurately. Recorded information will be used to determine the next step in treatment. For the purpose of accuracy, follow these rules: Evaluate only the area treated. Do not report or include pain from an untreated area. For the purpose of this evaluation, ignore all other areas of pain, except for the treated area. After your procedure,  avoid taking a long nap and attempting to complete the pain diary after you wake up. Instead, set your alarm clock to go off every hour, on the hour, for the initial 8 hours after the procedure. Document the duration of the numbing medicine, and the relief you are getting from it. Do not go to sleep and attempt to complete it later. It will not be accurate. If you received sedation, it is likely that you were given a medication that may cause amnesia. Because of this, completing the diary at a later time may cause the information to be inaccurate. This information is needed to plan your care. Follow-up appointment: Keep your post-procedure follow-up evaluation appointment after the procedure (usually 2 weeks for most procedures, 6 weeks for radiofrequencies). DO NOT FORGET to bring you pain diary with you.   Expect: (What should I expect to see with my procedure?) From numbing medicine (AKA: Local Anesthetics): Numbness or decrease in pain. You may also experience some weakness, which if present, could last for the duration of the local anesthetic. Onset: Full effect within 15 minutes of injected. Duration: It will depend on the type of local anesthetic used. On the average, 1 to 8 hours.  From steroids (Applies only if steroids were used): Decrease in swelling or inflammation. Once inflammation is improved, relief of the pain will follow. Onset of benefits: Depends on the amount of swelling present. The more swelling, the longer it will take for the benefits to be seen. In some cases, up to 10 days. Duration: Steroids will stay in the system x 2 weeks. Duration of benefits will depend on multiple posibilities including persistent irritating factors. Side-effects: If  present, they may typically last 2 weeks (the duration of the steroids). Frequent: Cramps (if they occur, drink Gatorade and take over-the-counter Magnesium 450-500 mg once to twice a day); water retention with temporary weight gain;  increases in blood sugar; decreased immune system response; increased appetite. Occasional: Facial flushing (red, warm cheeks); mood swings; menstrual changes. Uncommon: Long-term decrease or suppression of natural hormones; bone thinning. (These are more common with higher doses or more frequent use. This is why we prefer that our patients avoid having any injection therapies in other practices.)  Very Rare: Severe mood changes; psychosis; aseptic necrosis. From procedure: Some discomfort is to be expected once the numbing medicine wears off. This should be minimal if ice and heat are applied as instructed.  Call if: (When should I call?) You experience numbness and weakness that gets worse with time, as opposed to wearing off. New onset bowel or bladder incontinence. (Applies only to procedures done in the spine)  Emergency Numbers: Durning business hours (Monday - Thursday, 8:00 AM - 4:00 PM) (Friday, 9:00 AM - 12:00 Noon): [phone] After hours: [phone] NOTE: If you are having a problem and are unable connect with, or to talk to a provider, then go to your nearest urgent care or emergency department. If the problem is serious and urgent, please call 911. ______________________________________________________________________    Today we did the following -We have done a Spinal Cord Stimulator Trial with [employer]  -As long as the leads are in place, do not bathe or shower. You may sponge bathe.  -While the lead is in place, please limit the bending, lifting, or twisting because the lead can move.  -The things we want to see is if your pain improves (and by what percentage), if you can do more activity (don't overdo it), and if you can use less of your "as needed" medicine. Do not stop long acting medicines like methadone, oxycontin, MS Contin, etc without checking with [place].  -It is VERY important that you pick up the antibiotics we prescribed, Keflex, on your way home  from the trial and take them as prescribed(4 times a day), starting today, for as long as the lead is in place.  -The Spina Cord Stimulator Representative will be in contact with you while the lead is in place to make sure the trial goes as well as possible.  -Please contact us with any questions or concerns at any time during the trial.   -If you start running a fever over 100 degrees, have severe back pain, or new pain running down the legs, or drainage coming from the lead site, contact us immediately and/or go to the emergency room.  -Please do not restart any sort of medication that can thin your blood such as Aspirin, ibuprofen, motrin, aleve, plavix, coumadin, etc. If you aren't sure, call and ask.  -We will have you return on 0  to have the lead removed. If this is successful, at that point we can go over the details about the permanent implant.

## 2023-07-14 NOTE — Progress Notes (Signed)
PROVIDER NOTE: Interpretation of information contained herein should be left to medically-trained personnel. Specific patient instructions are provided elsewhere under "Patient Instructions" section of medical record. This document was created in part using STT-dictation technology, any transcriptional errors that may result from this process are unintentional.  Patient: Christopher Huang Type: Established DOB: [birth year] MRN: [id number] PCP: Marjie Skiff, NP  Service: Procedure DOS: 07/14/2023 Setting: Ambulatory Location: Ambulatory outpatient facility Delivery: Face-to-face Provider: Edward Jolly, MD Specialty: Interventional Pain Management Specialty designation: 09 Location: Outpatient facility Ref. Prov.: Edward Jolly, MD       Interventional Therapy   Primary Reason for Admission: Surgical management of chronic pain condition.   Procedure:              Type: [employer] Trial Spinal Cord Neurostimulator Implant (Percutaneous, interlaminar, posterior epidural placement) Laterality: Bilateral (-50)  Level: Lumbar  Imaging: Fluoroscopic guidance Anesthesia: Local anesthesia (1-2% Lidocaine) Sedation: moderate DOS: 07/14/2023  Performed by: Edward Jolly, MD  Purpose: Diagnostic. To determine if a permanent implant may be effective in controlling some or all of [name] chronic pain symptoms.   Rationale (medical necessity): procedure needed and proper for the diagnosis and/or treatment of [name] medical symptoms and needs. 1. Spinal stenosis, lumbar region, without neurogenic claudication (L3/4)   2. Failed back surgical syndrome   3. Lumbar facet arthropathy    NAS-11 Pain score:   Pre-procedure: 2 /10   Post-procedure: 0-No pain/10     Target: Posterior epidural space over the dorsal columns of the spinal cord. Location: Posterior intraspinal canal Region: Thoracolumbar  Approach: Translaminar percutaneous  Type of procedure: Surgical   Position /  Prep / Materials:  Position: Prone  Prep solution: DuraPrep (Iodine Povacrylex [0.7% available iodine] and Isopropyl Alcohol, 74% w/w) Prep Area: Entire  Posterior  Thoracolumbar  Region  Materials:  Tray: Implant tray Needle(s):  Type: Epidural  Gauge (G):  14   Length: Regular (10cm)  Qty: 2  H&P (Pre-op Assessment):  [name] is a 72 y.o. (year old), male patient, seen today for interventional treatment. He  has a past surgical history that includes prostate cancer removed; Tonsillectomy; Knee arthroscopy; basal skin cancers; Tonsillectomy; Colonoscopy with propofol (N/A, 11/09/2017); Hand surgery; Total knee arthroplasty (Left, 02/09/2020); Eye surgery (Bilateral); Anterior lateral lumbar fusion with percutaneous screw 1 level (N/A, 01/29/2021); Cataract extraction, bilateral; and Colonoscopy with propofol (N/A, 12/01/2022).  Initial Vital Signs:  Pulse/EKG Rate: 62 (Pt states cardiologist aware of low HR)ECG Heart Rate: 64 (NSR) Temp: (!) 97.2 F (36.2 C) Resp: 16 BP: 127/64 SpO2: 99 %  BMI: Estimated body mass index is 29.16 kg/m as calculated from the following:   Height as of this encounter: 6\' 1"  (1.854 m).   Weight as of this encounter: 221 lb (100.2 kg).  Risk Assessment: Allergies: Reviewed. He is allergic to percocet [oxycodone-acetaminophen], tramadol, and vicodin [hydrocodone-acetaminophen].  Allergy Precautions: None required Coagulopathies: Reviewed. None identified.  Christopher Huang-thinner therapy: None at this time Active Infection(s): Reviewed. None identified. [name] is afebrile  Site Confirmation: Christopher Huang was asked to confirm the procedure and laterality before marking the site, which he did. Procedure checklist: Completed Consent: Before the procedure and under the influence of no sedative(s), amnesic(s), or anxiolytics, the patient was informed of the treatment options, risks and possible complications. To fulfill our ethical and legal obligations, as  recommended by the American Medical Association's Code of Ethics, I have informed the patient of my clinical impression; the nature and purpose  of the treatment or procedure; the risks, benefits, and possible complications of the intervention; the alternatives, including doing nothing; the risk(s) and benefit(s) of the alternative treatment(s) or procedure(s); and the risk(s) and benefit(s) of doing nothing.  [name] was provided with information about the general risks and possible complications associated with most interventional procedures. These include, but are not limited to: failure to achieve desired goals, infection, bleeding, organ or nerve damage, allergic reactions, paralysis, and/or death.  In addition, he was informed of those risks and possible complications associated to this particular procedure, which include, but are not limited to: damage to the implant; failure to decrease pain; local, systemic, or serious CNS infections, intraspinal abscess with possible cord compression and paralysis, or life-threatening such as meningitis; intrathecal and/or epidural bleeding with formation of hematoma with possible spinal cord compression and permanent paralysis; organ damage; nerve injury or damage with subsequent sensory, motor, and/or autonomic system dysfunction, resulting in transient or permanent pain, numbness, and/or weakness of one or several areas of the body; allergic reactions, either minor or major life-threatening, such as anaphylactic or anaphylactoid reactions.  Furthermore, [name] was informed of those risks and complications associated with the medications. These include, but are not limited to: allergic reactions (i.e.: anaphylactic or anaphylactoid reactions); arrhythmia;  Hypotension/hypertension; cardiovascular collapse; respiratory depression and/or shortness of breath; swelling or edema; medication-induced neural toxicity; particulate matter embolism and Christopher Huang vessel  occlusion with resultant organ, and/or nervous system infarction and permanent paralysis.  Finally, he was informed that Medicine is not an exact science; therefore, there is also the possibility of unforeseen or unpredictable risks and/or possible complications that may result in a catastrophic outcome. The patient indicated having understood very clearly. We have given the patient no guarantees and we have made no promises. Enough time was given to the patient to ask questions, all of which were answered to the patient's satisfaction. Mr. Aronow has indicated that he wanted to continue with the procedure. Attestation: I, the ordering provider, attest that I have discussed with the patient the benefits, risks, side-effects, alternatives, likelihood of achieving goals, and potential problems during recovery for the procedure that I have provided informed consent. Date  Time: 07/14/2023  7:39 AM  Pre-Procedure Preparation:  Monitoring: As per clinic protocol. Respiration, ETCO2, SpO2, BP, heart rate and rhythm monitor placed and checked for adequate function Safety Precautions: Patient was assessed for positional comfort and pressure points before starting the procedure. Time-out: I initiated and conducted the "Time-out" before starting the procedure, as per protocol. The patient was asked to participate by confirming the accuracy of the "Time Out" information. Verification of the correct person, site, and procedure were performed and confirmed by me, the nursing staff, and the patient. "Time-out" conducted as per Joint Commission's Universal Protocol (UP.01.01.01). Time: 0832 Start Time: 0832 hrs.  Description/Narrative of Procedure:          Rationale (medical necessity): procedure needed and proper for the diagnosis and/or treatment of the patient's medical symptoms and needs. Procedural Technique Safety Precautions: Aspiration looking for Christopher Huang return was conducted prior to all injections. At no  point did we inject any substances, as a needle was being advanced. No attempts were made at seeking any paresthesias. Safe injection practices and needle disposal techniques used. Medications properly checked for expiration dates. SDV (single dose vial) medications used. Description of the Procedure: Protocol guidelines were followed. The patient was assisted into a comfortable position. The target area was identified and the area prepped in  the usual manner. Skin & deeper tissues infiltrated with local anesthetic. Appropriate amount of time allowed to pass for local anesthetics to take effect. The procedure needles were then advanced to the target area. Proper needle placement secured. Negative aspiration confirmed. Solution injected in intermittent fashion, asking for systemic symptoms every 0.5cc of injectate. The needles were then removed and the area cleansed, making sure to leave some of the prepping solution back to take advantage of its long term bactericidal properties.  Technical description of procedure: Availability of a responsible, adult driver, and NPO status confirmed. Informed consent was obtained after having discussed risks and possible complications. An IV was started. The patient was then taken to the fluoroscopy suite, where the patient was placed in position for the procedure, over the fluoroscopy table. The patient was then monitored in the usual manner. Fluoroscopy was manipulated to obtain the best possible view of the target. Parallex error was corrected before commencing the procedure. Once a clear view of the target had been obtained, the skin and deeper tissues over the procedure site were infiltrated using lidocaine, loaded in a 10 cc luer-loc syringe with a 0.5 inch, 25-G needle. The introducer needle(s) was/were then inserted through the skin and deeper tissues. A paramidline approach was used to enter the posterior epidural space at a 30 angle, using "Loss-of-resistance  Technique" with 3 ml of PF-NaCl (0.9% NSS). Correct needle placement was confirmed in the antero-posterior and lateral fluoroscopic views. The lead was gently introduced and manipulated under real-time fluoroscopy, constantly assessing for pain, discomfort, or paresthesias, until the tip rested at the desired level. Both sides were done in identical fashion. Electrode placement was tested until appropriate coverage was attained. Once the patient confirmed that the stimulation was over the desired area, the lead(s) was/were secured in place and the introducer needles removed. This was done under real-time fluoroscopy while observing the electrode tip to avoid unintended migration. The area was covered with a non-occlusive dressing and the patient transported to recovery for further programming.  Vitals:   07/14/23 0937 07/14/23 0942 07/14/23 0947 07/14/23 0952  BP: 120/70 128/64 125/60 123/65  Pulse:      Resp: 17 20 15 16   Temp:      SpO2: 96% 97% 97% 98%  Weight:      Height:        Start Time: 0832 hrs. End Time:   hrs.  Neurostimulator Details:   Lead(s):  Brand: [employer]         Epidural Access Level:  T12-L1 L1-2  Lead implant:  Bilateral   No. of Electrodes/Lead:  16 16  Laterality:  Right Left  Top electrode location:  T7 T7  Bottom electrode location:  T9 T9  Model No.: [id number] E Same  Length: 50 cm Same  Lot No.: [id number]   Imaging Guidance (Spinal):          Type of Imaging Technique: Fluoroscopy Guidance (Spinal) Indication(s): Assistance in needle guidance and placement for procedures requiring needle placement in or near specific anatomical locations not easily accessible without such assistance. Exposure Time: Please see nurses notes. Contrast: None used. Fluoroscopic Guidance: I was personally present during the use of fluoroscopy. "Tunnel Vision Technique" used to obtain the best possible view of the target area. Parallax error corrected before  commencing the procedure. "Direction-depth-direction" technique used to introduce the needle under continuous pulsed fluoroscopy. Once target was reached, antero-posterior, oblique, and lateral fluoroscopic projection used confirm needle placement in all planes. Images permanently  stored in EMR. Interpretation: No contrast injected. I personally interpreted the imaging intraoperatively. Adequate needle placement confirmed in multiple planes. Permanent images saved into the patient's record.      Antibiotic Prophylaxis:   Anti-infectives (From admission, onward)    Start     Dose/Rate Route Frequency Ordered Stop   07/14/23 0830  ceFAZolin (ANCEF) IVPB 2g/100 mL premix        2 g 200 mL/hr over 30 Minutes Intravenous  Once 07/14/23 0823 07/14/23 0845   07/14/23 0000  cephALEXin (KEFLEX) 500 MG capsule        500 mg Oral 4 times daily 07/14/23 0813 07/22/23 2359      Indication(s): Implant Prophylaxis.  Post-operative Assessment:  Post-procedure Vital Signs:  Pulse/HCG Rate: 62 (Pt states cardiologist aware of low HR)62 Temp: (!) 97.2 F (36.2 C) Resp: 16 BP: 123/65 SpO2: 98 %  Complications: No immediate post-treatment complications observed by team, or reported by patient.  Note:  Patient  had vasovagal reaction at end of procedure, see nursing notes, given glycopyrolate and ephedrine with improvement in symptoms.  A repeat set of vitals were taken after the procedure and the patient was kept under observation following institutional policy, for this type of procedure. Post-procedural neurological assessment was performed, showing return to baseline, prior to discharge. The patient was provided with post-procedure discharge instructions, including a section on how to identify potential problems. Should any problems arise concerning this procedure, the patient was given instructions to immediately contact us, at any time, without hesitation. In any case, we plan to contact the patient  by telephone for a follow-up status report regarding this interventional procedure.  Comments:  No additional relevant information.  Plan of Care  Orders:  Orders Placed This Encounter  Procedures   DG PAIN CLINIC C-ARM 1-60 MIN NO REPORT    Intraoperative interpretation by procedural physician at [facility].    Standing Status:   Standing    Number of Occurrences:   1    Order Specific Question:   Reason for exam:    Answer:   Assistance in needle guidance and placement for procedures requiring needle placement in or near specific anatomical locations not easily accessible without such assistance.    Medications administered: We administered lidocaine, lactated ringers, midazolam, fentaNYL, ropivacaine (PF) 2 mg/mL (0.2%), ceFAZolin, glycopyrrolate, and ePHEDrine.  See the medical record for exact dosing, route, and time of administration.  Follow-up plan:   Return in about 8 days (around 07/22/2023) for PPE, remove SCS.      Recent Visits Date Type Provider Dept  05/19/23 Office Visit Edward Jolly, MD [facility]  Showing recent visits within past 90 days and meeting all other requirements Today's Visits Date Type Provider Dept  07/14/23 Procedure visit Edward Jolly, MD [facility]  Showing today's visits and meeting all other requirements Future Appointments Date Type Provider Dept  07/22/23 Appointment Edward Jolly, MD [facility]  Showing future appointments within next 90 days and meeting all other requirements  Disposition: Discharge home  Discharge (Date  Time): 07/14/2023; 1003 hrs.   Primary Care Physician: Marjie Skiff, NP Location: [facility] Note by: Edward Jolly, MD (TTS technology used. I apologize for any typographical errors that were not detected and corrected.) Date: 07/14/2023; Time: 1:10 PM

## 2023-07-14 NOTE — Progress Notes (Signed)
1610 Dr. Cherylann Ratel aware patient VS.   Glycopyrolate 0.2mg  given IVP per MD order at 0910.  0912 Ephedrine 10mg  IVP per MD order.  9604 Report to RR nurse. Dr. Cherylann Ratel at bedside.

## 2023-07-14 NOTE — Progress Notes (Signed)
0865 bed taken to procedure room, Pale and diaphorecic. To recovery dr Cherylann Ratel  at bedside, BP  wnl and color to normal, a&o, wife at bedside for teaching.

## 2023-07-14 NOTE — Progress Notes (Signed)
Safety precautions to be maintained throughout the outpatient stay will include: orient to surroundings, keep bed in low position, maintain call bell within reach at all times, provide assistance with transfer out of bed and ambulation.  

## 2023-07-15 ENCOUNTER — Telehealth: Payer: Self-pay

## 2023-07-15 NOTE — Telephone Encounter (Signed)
Called PP. Spoke with wife and patient is doing well. States he is now out to [employer] and is walking better.

## 2023-07-19 ENCOUNTER — Ambulatory Visit: Payer: PPO | Admitting: Student in an Organized Health Care Education/Training Program

## 2023-07-22 ENCOUNTER — Ambulatory Visit: Payer: PPO | Attending: Cardiovascular Disease

## 2023-07-22 ENCOUNTER — Encounter: Payer: Self-pay | Admitting: Student in an Organized Health Care Education/Training Program

## 2023-07-22 ENCOUNTER — Ambulatory Visit
Admission: RE | Admit: 2023-07-22 | Discharge: 2023-07-22 | Disposition: A | Discharge status: Home / Self Care | Payer: PPO | Source: Ambulatory Visit | Attending: Student in an Organized Health Care Education/Training Program | Admitting: Student in an Organized Health Care Education/Training Program

## 2023-07-22 ENCOUNTER — Ambulatory Visit
Payer: PPO | Attending: Student in an Organized Health Care Education/Training Program | Admitting: Student in an Organized Health Care Education/Training Program

## 2023-07-22 VITALS — BP 115/80 | HR 53 | Temp 97.5°F | Resp 16 | Ht 73.0 in | Wt 221.0 lb

## 2023-07-22 DIAGNOSIS — M961 Postlaminectomy syndrome, not elsewhere classified: Secondary | ICD-10-CM

## 2023-07-22 DIAGNOSIS — I428 Other cardiomyopathies: Secondary | ICD-10-CM | POA: Diagnosis not present

## 2023-07-22 DIAGNOSIS — M48061 Spinal stenosis, lumbar region without neurogenic claudication: Secondary | ICD-10-CM

## 2023-07-22 LAB — ECHOCARDIOGRAM COMPLETE
Area-P 1/2: 4.26 cm2
Height: 73 in
S' Lateral: 4.8 cm
Weight: 3536 oz

## 2023-07-22 NOTE — Progress Notes (Signed)
Patient: Christopher Huang  Service Category: Procedure  Provider: Edward Jolly, MD  DOB: 1951/04/04  DOS: 07/22/2023  Referring Provider: Marjie Skiff, NP  MRN: [id number]  Setting: Ambulatory outpatient  PCP: Marjie Skiff, NP  Type: Established Patient  Specialty: Interventional Pain Management    Location: Office  Delivery: Face-to-face  SCS TRIAL POST-OP EVALUATION   Primary Reason(s) for Visit: Encounter for removal of temporary spinal cord stimulator lead(s) and evaluation of trial implant. CC: Back Pain  HPI  Christopher Huang is a 72 y.o. year old, male patient, who comes today for a post-procedure evaluation. He has Allergic rhinitis; History of prostate cancer; Hyperlipidemia; Hypertension; Personal history of kidney stones; Personal history of colonic polyps; Arthritis of hand; Advanced care planning/counseling discussion; Knee joint replacement status, left; History of back surgery; Vitamin D deficiency; Frontotemporal dementia (HCC); LBBB (left bundle branch block); Heart failure with reduced ejection fraction (HCC); Polyp of ascending colon; Lumbar facet arthropathy; Spinal stenosis, lumbar region, without neurogenic claudication (L3/4); Lumbar degenerative disc disease; Chronic knee pain after total replacement of left knee joint; and Failed back surgical syndrome on their problem list. His primarily concern today is the Back Pain  Pain Assessment: Location: Lower Back Radiating: denies Onset: More than a month ago Duration: Chronic pain Quality: Aching, Constant Severity: 2 /10 (subjective, self-reported pain score)  Effect on ADL: limits daily activities Timing: Constant Modifying factors: meds BP: 115/80  HR: (!) 49  Christopher Huang comes in today, after a SCS (Spinal Cord Stimulator) Trial Implant on 07/15/2023, to have his percutaneous, temporary neurostimulator lead(s) removed and to evaluate the trial experience to determine if a permanent implant may be effective in  controlling some or all of his chronic pain symptoms.  Patient reports mild relief with his spinal cord stimulator trial, currently at 10 to 15%.  We will hold off on permanent implant.  The trial leads were removed and the left fluoroscopy, tips intact.  Further details on both, my assessment(s), as well as the proposed treatment plan, please see below.  Post-operative Assessment  Intra-procedural problems/complications: None observed.         Reported side-effects: None.        Post-surgical adverse reactions or complications: None reported         Laboratory Chemistry Profile   Renal Lab Results  Component Value Date   BUN 12 03/30/2023   CREATININE 0.96 03/30/2023   BCR 13 03/30/2023   GFRAA 103 08/16/2020   GFRNONAA >60 12/03/2020   SPECGRAV 1.025 03/27/2022   PHUR 7.0 03/27/2022   PROTEINUR Negative 03/27/2022     Electrolytes Lab Results  Component Value Date   NA 149 (H) 03/30/2023   K 4.7 03/30/2023   CL 106 03/30/2023   CALCIUM 8.4 (L) 03/30/2023     Hepatic Lab Results  Component Value Date   AST 22 03/30/2023   ALT 13 03/30/2023   ALBUMIN 4.6 03/30/2023   ALKPHOS 93 03/30/2023     ID Lab Results  Component Value Date   SARSCOV2NAA Not Detected 04/28/2022   STAPHAUREUS POSITIVE (A) 12/03/2020   MRSAPCR NEGATIVE 12/03/2020     Bone Lab Results  Component Value Date   VD25OH 50.4 03/30/2023     Endocrine Lab Results  Component Value Date   GLUCOSE 100 (H) 03/30/2023   GLUCOSEU Negative 03/27/2022   HGBA1C 5.6 09/26/2021   TSH 1.810 03/30/2023     Neuropathy Lab Results  Component Value Date   VITAMINB12 946  03/30/2023   HGBA1C 5.6 09/26/2021     CNS No results found for: "COLORCSF", "APPEARCSF", "RBCCOUNTCSF", "WBCCSF", "POLYSCSF", "LYMPHSCSF", "EOSCSF", "PROTEINCSF", "GLUCCSF", "JCVIRUS", "CSFOLI", "IGGCSF", "LABACHR", "ACETBL"   Inflammation (CRP: Acute  ESR: Chronic) No results found for: "CRP", "ESRSEDRATE", "LATICACIDVEN"    Rheumatology No results found for: "RF", "ANA", "LABURIC", "URICUR", "LYMEIGGIGMAB", "LYMEABIGMQN", "HLAB27"   Coagulation Lab Results  Component Value Date   INR 1.0 12/03/2020   LABPROT 12.7 12/03/2020   APTT 32 12/03/2020   PLT 208 03/30/2023     Cardiovascular Lab Results  Component Value Date   HGB 14.8 03/30/2023   HCT 44.4 03/30/2023     Screening Lab Results  Component Value Date   SARSCOV2NAA Not Detected 04/28/2022   STAPHAUREUS POSITIVE (A) 12/03/2020   MRSAPCR NEGATIVE 12/03/2020     Cancer No results found for: "CEA", "CA125", "LABCA2"   Allergens No results found for: "ALMOND", "APPLE", "ASPARAGUS", "AVOCADO", "BANANA", "BARLEY", "BASIL", "BAYLEAF", "GREENBEAN", "LIMABEAN", "WHITEBEAN", "BEEFIGE", "REDBEET", "BLUEBERRY", "BROCCOLI", "CABBAGE", "MELON", "CARROT", "CASEIN", "CASHEWNUT", "CAULIFLOWER", "CELERY"     Note: Lab results reviewed.  Recent Imaging Results  Trial lead removed under laparoscopy with tips intact  Interpretation Report: Fluoroscopy was used during the procedure to assist with needle guidance. The images were interpreted intraoperatively by the requesting physician.        Meds   Current Outpatient Medications:    Acetaminophen (ACETAMINOPHEN EXTRA STRENGTH) 500 MG capsule, Take 1 capsule by mouth every 6 (six) hours as needed for fever., Disp: , Rfl:    ASPIRIN 81 PO, Take 81 mg by mouth daily., Disp: , Rfl:    carvedilol (COREG) 3.125 MG tablet, Take 1 tablet (3.125 mg total) by mouth 2 (two) times daily., Disp: 180 tablet, Rfl: 3   cephALEXin (KEFLEX) 500 MG capsule, Take 1 capsule (500 mg total) by mouth 4 (four) times daily for 8 days., Disp: 32 capsule, Rfl: 0   Cholecalciferol (VITAMIN D3) 50 MCG (2000 UT) TABS, Take 2,000 Units by mouth daily., Disp: , Rfl:    Coenzyme Q10 100 MG TABS, Take 100 mg by mouth daily., Disp: , Rfl:    docusate sodium (COLACE) 100 MG capsule, Take 100 mg by mouth daily., Disp: , Rfl:    donepezil  (ARICEPT) 5 MG tablet, TAKE 1 TABLET BY MOUTH EVERYDAY AT BEDTIME, Disp: 90 tablet, Rfl: 0   empagliflozin (JARDIANCE) 10 MG TABS tablet, Take 1 tablet (10 mg total) by mouth daily before breakfast. Lot # [id number] Exp. 1/26, Disp: , Rfl:    fexofenadine (ALLEGRA ALLERGY) 180 MG tablet, Take 1 tablet (180 mg total) by mouth daily., Disp: 10 tablet, Rfl: 1   fluticasone (FLONASE) 50 MCG/ACT nasal spray, PLACE 2 SPRAYS INTO EACH NOSTRIL DAILY., Disp: 48 mL, Rfl: 3   Misc Natural Products (RELAX & SLEEP PO), Take by mouth. Reports taking "Relaxium" nightly for sleep (contains 5mg  Melatonin, 100mg  Magnesium, L-Tryptophan, Valerest, Ashwagandha, 100mg  GABA, Chamomile and Passionflower), Disp: , Rfl:    Multiple Vitamin (MULTIVITAMIN WITH MINERALS) TABS tablet, Take 1 tablet by mouth daily. Men's Multivitamin 50+, Disp: , Rfl:    rosuvastatin (CRESTOR) 40 MG tablet, TAKE 1 TABLET (40 MG TOTAL) BY MOUTH DAILY. STOP TAKING 20 MG TABLETS., Disp: 90 tablet, Rfl: 0   Sacubitril-Valsartan (ENTRESTO PO), Take by mouth., Disp: , Rfl:    valsartan (DIOVAN) 160 MG tablet, Take 1 tablet (160 mg total) by mouth daily., Disp: 90 tablet, Rfl: 3   vitamin B-12 (CYANOCOBALAMIN) 500 MCG tablet, Take 500  mcg by mouth daily., Disp: , Rfl:   ROS  Constitutional: Denies any fever or chills Gastrointestinal: No reported hemesis, hematochezia, vomiting, or acute GI distress Musculoskeletal: Denies any acute onset joint swelling, redness, loss of ROM, or weakness Neurological: No reported episodes of acute onset apraxia, aphasia, dysarthria, agnosia, amnesia, paralysis, loss of coordination, or loss of consciousness  Allergies  Christopher Huang is allergic to percocet [oxycodone-acetaminophen], tramadol, and vicodin [hydrocodone-acetaminophen].  PFSH  Drug: Christopher Huang  reports no history of drug use. Alcohol:  reports no history of alcohol use. Tobacco:  reports that he has never smoked. He has never used smokeless  tobacco. Medical:  has a past medical history of Allergic rhinitis, Cancer of prostate (HCC) (02/2007), Dementia (HCC), History of kidney stones, Hyperlipidemia, Hypertension, and Personal history of kidney stones. Surgical: Christopher Huang  has a past surgical history that includes prostate cancer removed; Tonsillectomy; Knee arthroscopy; basal skin cancers; Tonsillectomy; Colonoscopy with propofol (N/A, 11/09/2017); Hand surgery; Total knee arthroplasty (Left, 02/09/2020); Eye surgery (Bilateral); Anterior lateral lumbar fusion with percutaneous screw 1 level (N/A, 01/29/2021); Cataract extraction, bilateral; and Colonoscopy with propofol (N/A, 12/01/2022). Family: family history includes Dementia in his mother; Prostate cancer in his father.  Postop Exam  General appearance: Afebrile. Well nourished, well developed, and well hydrated. In no apparent acute distress. Vitals:   07/22/23 0806  BP: 115/80  Pulse: (!) 53  Resp: 16  Temp: (!) 97.5 F (36.4 C)  SpO2: 96%  Weight: 221 lb (100.2 kg)  Height: 6\' 1"  (1.854 m)   BMI Assessment: Estimated body mass index is 29.16 kg/m as calculated from the following:   Height as of this encounter: 6\' 1"  (1.854 m).   Weight as of this encounter: 221 lb (100.2 kg).  Surgical site: Wound is healing well. No redness, tenderness, discharge, abnormal odors, or any other evidence of infection or complications.  Assessment  Primary Diagnosis & Pertinent Problem List: The primary encounter diagnosis was Failed back surgical syndrome. A diagnosis of Spinal stenosis, lumbar region, without neurogenic claudication (L3/4) was also pertinent to this visit.  Diagnosis  1. Failed back surgical syndrome   2. Spinal stenosis, lumbar region, without neurogenic claudication (L3/4)      Plan of Care  Orders:  Orders Placed This Encounter  Procedures   DG PAIN CLINIC C-ARM 1-60 MIN NO REPORT    Intraoperative interpretation by procedural physician at [facility].    Standing Status:   Standing    Number of Occurrences:   1    Order Specific Question:   Reason for exam:    Answer:   Assistance in needle guidance and placement for procedures requiring needle placement in or near specific anatomical locations not easily accessible without such assistance.   Will hold off on spinal cord stimulator implant referral at this time due to limited benefit with spinal cord stim trial.  Will continue to monitor symptoms.  At this point the only other options for this patient would be peripheral nerve stimulation which I will talk to him about later if he is interested.  Medications administered: Christopher Seller A. [place] Scrape "Christopher Huang" had no medications administered during this visit.  See the medical record for exact dosing, route, and time of administration.  Follow-up plan:   Return if symptoms worsen or fail to improve.      Recent Visits Date Type Provider Dept  07/14/23 Procedure visit Edward Jolly, MD [facility]  05/19/23 Office Visit Edward Jolly, MD [facility]  Showing recent visits within past 90 days and meeting all other requirements Today's Visits Date Type Provider Dept  07/22/23 Procedure visit Edward Jolly, MD [facility]  Showing today's visits and meeting all other requirements Future Appointments No visits were found meeting these conditions. Showing future appointments within next 90 days and meeting all other requirements  Disposition: Discharge home  Discharge (Date  Time): 07/22/2023; 0831 hrs.   Primary Care Physician: Marjie Skiff, NP Location: [facility] Note by: Edward Jolly, MD (TTS technology used. I apologize for any typographical errors that were not detected and corrected.) Date: 07/22/2023; Time: 8:43 AM

## 2023-07-22 NOTE — Progress Notes (Signed)
Safety precautions to be maintained throughout the outpatient stay will include: orient to surroundings, keep bed in low position, maintain call bell within reach at all times, provide assistance with transfer out of bed and ambulation.  

## 2023-07-27 ENCOUNTER — Telehealth: Payer: Self-pay | Admitting: Cardiovascular Disease

## 2023-07-27 ENCOUNTER — Ambulatory Visit: Payer: PPO | Admitting: Psychiatry

## 2023-07-27 NOTE — Telephone Encounter (Signed)
Pt's wife returning call regarding Echo results. Please advise

## 2023-07-28 NOTE — Telephone Encounter (Signed)
Called and spoke with wife per DPR. Informed her of the following results form Dr. Mariah Milling.  Echocardiogram  Ejection fraction remains depressed, 30 to 35%, unchanged from prior study August 2023  For any worsening symptoms may need to go back on the [place],  And consider trial of Jardiance/Farxiga   Wife verbalizes understanding.

## 2023-08-12 DIAGNOSIS — L820 Inflamed seborrheic keratosis: Secondary | ICD-10-CM | POA: Diagnosis not present

## 2023-08-12 DIAGNOSIS — D225 Melanocytic nevi of trunk: Secondary | ICD-10-CM | POA: Diagnosis not present

## 2023-08-12 DIAGNOSIS — Z08 Encounter for follow-up examination after completed treatment for malignant neoplasm: Secondary | ICD-10-CM | POA: Diagnosis not present

## 2023-08-12 DIAGNOSIS — D2262 Melanocytic nevi of left upper limb, including shoulder: Secondary | ICD-10-CM | POA: Diagnosis not present

## 2023-08-12 DIAGNOSIS — D485 Neoplasm of uncertain behavior of skin: Secondary | ICD-10-CM | POA: Diagnosis not present

## 2023-08-12 DIAGNOSIS — D2272 Melanocytic nevi of left lower limb, including hip: Secondary | ICD-10-CM | POA: Diagnosis not present

## 2023-08-12 DIAGNOSIS — Z872 Personal history of diseases of the skin and subcutaneous tissue: Secondary | ICD-10-CM | POA: Diagnosis not present

## 2023-08-12 DIAGNOSIS — D2261 Melanocytic nevi of right upper limb, including shoulder: Secondary | ICD-10-CM | POA: Diagnosis not present

## 2023-08-12 DIAGNOSIS — D0462 Carcinoma in situ of skin of left upper limb, including shoulder: Secondary | ICD-10-CM | POA: Diagnosis not present

## 2023-08-12 DIAGNOSIS — L218 Other seborrheic dermatitis: Secondary | ICD-10-CM | POA: Diagnosis not present

## 2023-08-12 DIAGNOSIS — L570 Actinic keratosis: Secondary | ICD-10-CM | POA: Diagnosis not present

## 2023-08-12 DIAGNOSIS — Z85828 Personal history of other malignant neoplasm of skin: Secondary | ICD-10-CM | POA: Diagnosis not present

## 2023-08-14 ENCOUNTER — Other Ambulatory Visit: Payer: Self-pay | Admitting: Nurse Practitioner

## 2023-08-16 ENCOUNTER — Other Ambulatory Visit: Payer: Self-pay | Admitting: Nurse Practitioner

## 2023-08-17 NOTE — Telephone Encounter (Signed)
Requested Prescriptions  Pending Prescriptions Disp Refills   rosuvastatin (CRESTOR) 40 MG tablet [Pharmacy Med Name: ROSUVASTATIN CALCIUM 40 MG TAB] 90 tablet 0    Sig: TAKE 1 TABLET (40 MG TOTAL) BY MOUTH DAILY. STOP TAKING 20 MG TABLETS.     Cardiovascular:  Antilipid - Statins 2 Failed - 08/16/2023  8:30 AM      Failed - Lipid Panel in normal range within the last 12 months    Cholesterol, Total  Date Value Ref Range Status  03/30/2023 143 100 - 199 mg/dL Final   Cholesterol Piccolo, Waived  Date Value Ref Range Status  07/15/2017 185 <200 mg/dL Final    Comment:                            Desirable                <200                         Borderline High      200- 239                         High                     >239    LDL Chol Calc (NIH)  Date Value Ref Range Status  03/30/2023 64 0 - 99 mg/dL Final   HDL  Date Value Ref Range Status  03/30/2023 52 >39 mg/dL Final   Triglycerides  Date Value Ref Range Status  03/30/2023 157 (H) 0 - 149 mg/dL Final   Triglycerides Piccolo,Waived  Date Value Ref Range Status  07/15/2017 223 (H) <150 mg/dL Final    Comment:                            Normal                   <150                         Borderline High     150 - 199                         High                200 - 499                         Very High                >499          Passed - Cr in normal range and within 360 days    Creatinine, Ser  Date Value Ref Range Status  03/30/2023 0.96 0.76 - 1.27 mg/dL Final         Passed - Patient is not pregnant      Passed - Valid encounter within last 12 months    Recent Outpatient Visits           4 months ago Frontotemporal dementia (HCC)   [place] [facility] [place], Corrie Dandy T, NP   9 months ago Frontotemporal dementia [facility])   [facility] [facility] [place], Corrie Dandy T, NP   1 year  ago 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

## 2023-08-17 NOTE — Telephone Encounter (Signed)
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

## 2023-08-20 ENCOUNTER — Other Ambulatory Visit: Payer: Self-pay | Admitting: Cardiovascular Disease

## 2023-08-24 ENCOUNTER — Other Ambulatory Visit: Payer: Self-pay | Admitting: Pharmacist

## 2023-08-24 NOTE — Progress Notes (Signed)
Pharmacy Quality Measure Review  This patient is appearing on a report for being at risk of failing the adherence measure for diabetes medications this calendar year. Although, appears patient is on Jardiance for heart failure.  Medication: Jardiance 10 mg daily Last fill date: 06/05/23 for 30 day supply  Outreached patient to discuss and spoke with his wife who manages medications. Wife reports that at last visit with Dr. Mariah Milling they requested to change Jardiance and Sherryll Burger due to cost. She reports Sherryll Burger was switched to valsartan. They previously declined patient assistance because of not wanting to provide tax documents. Discussed the [employer] cardiomyopathy fund which would make Jardiance and Sherryll Burger free and would not require submitting tax documents. She was amenable to this and grateful for the help. Will collaborate with CPP to get them set up with [facility] and send message to cardiologist.  Jarrett Ables, PharmD PGY-1 Pharmacy Resident

## 2023-08-29 NOTE — Progress Notes (Signed)
08/29/2023  Patient ID: Christopher Huang, male   DOB: Sep 10, 1951, 72 y.o.   MRN: [id number]  Request received from PGY1 [employer] Resident to assist patient with [facility] grant enrollment to assist with affordability of Entresto and [place].  These prescriptions were recently changed by cardiologist due to cost.  [employer] Grant fund for cardiomyopathy is currently open, and I was able to enroll patient for grant that will cover his copays for these medications, up to $10,000 for the next year.  Providing the patient with the below information to give to the pharmacy to bill secondary to insurance.  I will also verify there are still active prescriptions for these medications at their pharmacy, and request Dr. Mariah Milling send if not.    Bin [id number] PCN [id number] Grp [id number] ID [id number] Help Desk [phone] Processor PDMI  [name], PharmD, DPLA

## 2023-08-30 ENCOUNTER — Other Ambulatory Visit: Payer: Self-pay

## 2023-08-30 DIAGNOSIS — I25118 Atherosclerotic heart disease of native coronary artery with other forms of angina pectoris: Secondary | ICD-10-CM

## 2023-08-30 DIAGNOSIS — I420 Dilated cardiomyopathy: Secondary | ICD-10-CM

## 2023-08-30 NOTE — Progress Notes (Signed)
08/30/2023  Patient ID: Christopher Huang, male   DOB: Jul 03, 1951, 72 y.o.   MRN: [id number]  Patient approved for [facility] to assist with copays for Jardiance 10mg  daily and Entresto 49/51mg  BID, but refills need to be sent to [facility] for him to be able to obtain,  Pending orders for Dr. Mariah Milling to sign if in agreement to stop valsartan and resume Entresto as well as resuming Jardiance.  [name], PharmD, DPLA

## 2023-08-30 NOTE — Telephone Encounter (Signed)
Please see pharmacist request note below.

## 2023-08-30 NOTE — Addendum Note (Signed)
Addended by: Sabino Niemann A on: 08/30/2023 10:14 AM   Modules accepted: Orders

## 2023-09-08 NOTE — Telephone Encounter (Signed)
Please see note below.

## 2023-09-16 DIAGNOSIS — D0462 Carcinoma in situ of skin of left upper limb, including shoulder: Secondary | ICD-10-CM | POA: Diagnosis not present

## 2023-09-26 NOTE — Patient Instructions (Signed)
Be Involved in Caring For Your Health:  Taking Medications When medications are taken as directed, they can greatly improve your health. But if they are not taken as prescribed, they may not work. In some cases, not taking them correctly can be harmful. To help ensure your treatment remains effective and safe, understand your medications and how to take them. Bring your medications to each visit for review by your provider.  Your lab results, notes, and after visit summary will be available on My Chart. We strongly encourage you to use this feature. If lab results are abnormal the clinic will contact you with the appropriate steps. If the clinic does not contact you assume the results are satisfactory. You can always view your results on My Chart. If you have questions regarding your health or results, please contact the clinic during office hours. You can also ask questions on My Chart.  We at [facility] are grateful that you chose [place] to provide your care. We strive to provide evidence-based and compassionate care and are always looking for feedback. If you get a survey from the clinic please complete this so we can hear your opinions.  Healthy Eating, Adult Healthy eating may help you get and keep a healthy body weight, reduce the risk of chronic disease, and live a long and productive life. It is important to follow a healthy eating pattern. Your nutritional and calorie needs should be met mainly by different nutrient-rich foods. What are tips for following this plan? Reading food labels Read labels and choose the following: Reduced or low sodium products. Juices with 100% fruit juice. Foods with low saturated fats (<3 g per serving) and high polyunsaturated and monounsaturated fats. Foods with whole grains, such as whole wheat, cracked wheat, brown rice, and wild rice. Whole grains that are fortified with folic acid. This is recommended for females who are pregnant or who want to  become pregnant. Read labels and do not eat or drink the following: Foods or drinks with added sugars. These include foods that contain brown sugar, corn sweetener, corn syrup, dextrose, fructose, glucose, high-fructose corn syrup, honey, invert sugar, lactose, malt syrup, maltose, molasses, raw sugar, sucrose, trehalose, or turbinado sugar. Limit your intake of added sugars to less than 10% of your total daily calories. Do not eat more than the following amounts of added sugar per day: 6 teaspoons (25 g) for females. 9 teaspoons (38 g) for males. Foods that contain processed or refined starches and grains. Refined grain products, such as white flour, degermed cornmeal, white bread, and white rice. Shopping Choose nutrient-rich snacks, such as vegetables, whole fruits, and nuts. Avoid high-calorie and high-sugar snacks, such as potato chips, fruit snacks, and candy. Use oil-based dressings and spreads on foods instead of solid fats such as butter, margarine, sour cream, or cream cheese. Limit pre-made sauces, mixes, and "instant" products such as flavored rice, instant noodles, and ready-made pasta. Try more plant-protein sources, such as tofu, tempeh, black beans, edamame, lentils, nuts, and seeds. Explore eating plans such as the Mediterranean diet or vegetarian diet. Try heart-healthy dips made with beans and healthy fats like hummus and guacamole. Vegetables go great with these. Cooking Use oil to saut or stir-fry foods instead of solid fats such as butter, margarine, or lard. Try baking, boiling, grilling, or broiling instead of frying. Remove the fatty part of meats before cooking. Steam vegetables in water or broth. Meal planning  At meals, imagine dividing your plate into fourths: One-half of  your plate is fruits and vegetables. One-fourth of your plate is whole grains. One-fourth of your plate is protein, especially lean meats, poultry, eggs, tofu, beans, or nuts. Include low-fat  dairy as part of your daily diet. Lifestyle Choose healthy options in all settings, including home, work, school, restaurants, or stores. Prepare your food safely: Wash your hands after handling raw meats. Where you prepare food, keep surfaces clean by regularly washing with hot, soapy water. Keep raw meats separate from ready-to-eat foods, such as fruits and vegetables. Cook seafood, meat, poultry, and eggs to the recommended temperature. Get a food thermometer. Store foods at safe temperatures. In general: Keep cold foods at 48F (4.4C) or below. Keep hot foods at 148F (60C) or above. Keep your freezer at [facility] (-17.8C) or below. Foods are not safe to eat if they have been between the temperatures of 40-148F (4.4-60C) for more than 2 hours. What foods should I eat? Fruits Aim to eat 1-2 cups of fresh, canned (in natural juice), or frozen fruits each day. One cup of fruit equals 1 small apple, 1 large banana, 8 large strawberries, 1 cup (237 g) canned fruit,  cup (82 g) dried fruit, or 1 cup (240 mL) 100% juice. Vegetables Aim to eat 2-4 cups of fresh and frozen vegetables each day, including different varieties and colors. One cup of vegetables equals 1 cup (91 g) broccoli or cauliflower florets, 2 medium carrots, 2 cups (150 g) raw, leafy greens, 1 large tomato, 1 large bell pepper, 1 large sweet potato, or 1 medium white potato. Grains Aim to eat 5-10 ounce-equivalents of whole grains each day. Examples of 1 ounce-equivalent of grains include 1 slice of bread, 1 cup (40 g) ready-to-eat cereal, 3 cups (24 g) popcorn, or  cup (93 g) cooked rice. Meats and other proteins Try to eat 5-7 ounce-equivalents of protein each day. Examples of 1 ounce-equivalent of protein include 1 egg,  oz nuts (12 almonds, 24 pistachios, or 7 walnut halves), 1/4 cup (90 g) cooked beans, 6 tablespoons (90 g) hummus or 1 tablespoon (16 g) peanut butter. A cut of meat or fish that is the size of a deck of  cards is about 3-4 ounce-equivalents (85 g). Of the protein you eat each week, try to have at least 8 sounce (227 g) of seafood. This is about 2 servings per week. This includes salmon, trout, herring, sardines, and anchovies. Dairy Aim to eat 3 cup-equivalents of fat-free or low-fat dairy each day. Examples of 1 cup-equivalent of dairy include 1 cup (240 mL) milk, 8 ounces (250 g) yogurt, 1 ounces (44 g) natural cheese, or 1 cup (240 mL) fortified soy milk. Fats and oils Aim for about 5 teaspoons (21 g) of fats and oils per day. Choose monounsaturated fats, such as canola and olive oils, mayonnaise made with olive oil or avocado oil, avocados, peanut butter, and most nuts, or polyunsaturated fats, such as sunflower, corn, and soybean oils, walnuts, pine nuts, sesame seeds, sunflower seeds, and flaxseed. Beverages Aim for 6 eight-ounce glasses of water per day. Limit coffee to 3-5 eight-ounce cups per day. Limit caffeinated beverages that have added calories, such as soda and energy drinks. If you drink alcohol: Limit how much you have to: 0-1 drink a day if you are male. 0-2 drinks a day if you are male. Know how much alcohol is in your drink. In the [place] one drink is one 12 oz bottle of beer (355 mL), one 5 oz glass of wine (  148 mL), or one 1 oz glass of hard liquor (44 mL). Seasoning and other foods Try not to add too much salt to your food. Try using herbs and spices instead of salt. Try not to add sugar to food. This information is based on [place] nutrition guidelines. To learn more, visit DisposableN[employer].be. Exact amounts may vary. You may need different amounts. This information is not intended to replace advice given to you by your health care provider. Make sure you discuss any questions you have with your health care provider. Document Revised: 08/10/2022 Document Reviewed: 08/10/2022 Elsevier Patient Education  2024 [employer].

## 2023-09-27 NOTE — Progress Notes (Unsigned)
Referring Physician:  Marjie Skiff, NP [address] [place],  [place] [address]  Primary Physician:  Marjie Skiff, NP  History of Present Illness: 09/30/2023 [name] has a history of HTN, heart failure, dementia, prostate CA, hyplipidemia.   He is s/p XLIF L4-L5 on 01/29/21.   He last did phone visit with Danielle on 01/05/23 and was to follow up with Dr. Cherylann Ratel. He's had facet injections, RFA, and SCS trial with no improvement in his pain.   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 Also with DDD L2-L3 with moderate bilateral foraminal stenosis.   He is here for follow up.   He has constant LBP that is worse with standing, walking, bending, or twisting. Pain is better with laying flat or using pain patch. No leg pain. No numbness, tingling, or weakness in the legs.   He is taking tylenol. Cardiology has told him to avoid all other NSAIDs. Some relief with salonpas patches.   Bowel/Bladder Dysfunction: none  Conservative measures:  Physical therapy: none  Multimodal medical therapy including regular antiinflammatories: tylenol  Injections:  Perc trial SCS on 07/14/23 with removal on 07/22/23 Bilateral RFA L2-L4 on 04/07/23 Bilateral MBB L2-L4 on 02/17/23 Bilateral MBB L2-L4 on 12/21/22  Past Surgery:  XLIF L4-L5 on 01/29/21  The symptoms are causing a significant impact on the patient's life.   Review of Systems:  A 10 point review of systems is negative, except for the pertinent positives and negatives detailed in the HPI.  Past Medical History: Past Medical History:  Diagnosis Date   Allergic rhinitis    Cancer of prostate (HCC) 02/2007   s/p surgery   Dementia (HCC)    History of kidney stones    H/O   Hyperlipidemia    Hypertension    Personal history of kidney stones     Past Surgical History: Past Surgical History:  Procedure Laterality Date   ANTERIOR LATERAL LUMBAR  FUSION WITH PERCUTANEOUS SCREW 1 LEVEL N/A 01/29/2021   Procedure: L4-5 LATERAL INTERBODY FUSION, L4-5 POSTERIOR FUSION;  Surgeon: Venetia Night, MD;  Location: [facility] ORS;  Service: Neurosurgery;  Laterality: N/A;   basal skin cancers     CATARACT EXTRACTION, BILATERAL     2021   COLONOSCOPY WITH PROPOFOL N/A 11/09/2017   Procedure: COLONOSCOPY WITH PROPOFOL;  Surgeon: Midge Minium, MD;  Location: [facility] ENDOSCOPY;  Service: Endoscopy;  Laterality: N/A;   COLONOSCOPY WITH PROPOFOL N/A 12/01/2022   Procedure: COLONOSCOPY WITH PROPOFOL;  Surgeon: Midge Minium, MD;  Location: [facility] ENDOSCOPY;  Service: Endoscopy;  Laterality: N/A;   EYE SURGERY Bilateral    Cataract   HAND SURGERY     THUMB SURGERY   KNEE ARTHROSCOPY     prostate cancer removed     TONSILLECTOMY     TONSILLECTOMY     TOTAL KNEE ARTHROPLASTY Left 02/09/2020   Procedure: TOTAL KNEE ARTHROPLASTY - RNFA;  Surgeon: Kennedy Bucker, MD;  Location: [facility] ORS;  Service: Orthopedics;  Laterality: Left;    Allergies: Allergies as of 09/30/2023 - Review Complete 09/30/2023  Allergen Reaction Noted   Percocet [oxycodone-acetaminophen] Nausea And Vomiting 03/27/2015   Tramadol Nausea And Vomiting 03/27/2015   Vicodin [hydrocodone-acetaminophen] Nausea And Vomiting 03/27/2015    Medications: Outpatient Encounter Medications as of 09/30/2023  Medication Sig   Acetaminophen (ACETAMINOPHEN EXTRA STRENGTH) 500 MG capsule Take 1 capsule by mouth every 6 (six) hours as needed for fever.  ASPIRIN 81 PO Take 81 mg by mouth daily.   carvedilol (COREG) 3.125 MG tablet TAKE 1 TABLET BY MOUTH 2 TIMES DAILY.   Cholecalciferol (VITAMIN D3) 50 MCG (2000 UT) TABS Take 2,000 Units by mouth daily.   Coenzyme Q10 100 MG TABS Take 100 mg by mouth daily.   docusate sodium (COLACE) 100 MG capsule Take 100 mg by mouth daily.   donepezil (ARICEPT) 5 MG tablet TAKE 1 TABLET BY MOUTH EVERYDAY AT BEDTIME   fluticasone (FLONASE) 50 MCG/ACT nasal spray PLACE 2  SPRAYS INTO EACH NOSTRIL DAILY.   levocetirizine (XYZAL) 5 MG tablet Take 1 tablet (5 mg total) by mouth every evening.   Misc Natural Products (RELAX & SLEEP PO) Take by mouth. Reports taking "Relaxium" nightly for sleep (contains 5mg  Melatonin, 100mg  Magnesium, L-Tryptophan, Valerest, Ashwagandha, 100mg  GABA, Chamomile and Passionflower)   Multiple Vitamin (MULTIVITAMIN WITH MINERALS) TABS tablet Take 1 tablet by mouth daily. Men's Multivitamin 50+   rosuvastatin (CRESTOR) 40 MG tablet TAKE 1 TABLET (40 MG TOTAL) BY MOUTH DAILY. STOP TAKING 20 MG TABLETS.   valsartan (DIOVAN) 160 MG tablet Take 160 mg by mouth daily.   vitamin B-12 (CYANOCOBALAMIN) 500 MCG tablet Take 500 mcg by mouth daily.   [DISCONTINUED] empagliflozin (JARDIANCE) 10 MG TABS tablet Take 1 tablet (10 mg total) by mouth daily before breakfast. Lot # [id number] Exp. 1/26   [DISCONTINUED] fexofenadine (ALLEGRA ALLERGY) 180 MG tablet Take 1 tablet (180 mg total) by mouth daily.   No facility-administered encounter medications on file as of 09/30/2023.    Social History: Social History   Tobacco Use   Smoking status: Never   Smokeless tobacco: Never  Vaping Use   Vaping status: Never Used  Substance Use Topics   Alcohol use: No   Drug use: No    Family Medical History: Family History  Problem Relation Age of Onset   Dementia Mother    Prostate cancer Father     Physical Examination: Vitals:   09/30/23 1349  BP: 130/76    General: Patient is well developed, well nourished, calm, collected, and in no apparent distress. Attention to examination is appropriate.  Respiratory: Patient is breathing without any difficulty.   NEUROLOGICAL:     Awake, alert, oriented to person, place, and time.  Speech is clear and fluent. Fund of knowledge is appropriate.   Cranial Nerves: Pupils equal round and reactive to light.  Facial tone is symmetric.    Well healed lumbar incisions. No lower lumbar tenderness.   No  abnormal lesions on exposed skin.   Strength: Side Biceps Triceps Deltoid Interossei Grip Wrist Ext. Wrist Flex.  R 5 5 5 5 5 5 5   L 5 5 5 5 5 5 5    Side Iliopsoas Quads Hamstring PF DF EHL  R 5 5 5 5 5 5   L 5 5 5 5 5 5    Reflexes are 2+ and symmetric at the biceps, brachioradialis, patella and achilles.   Hoffman's is absent.  Clonus is not present.   Bilateral upper and lower extremity sensation is intact to light touch.     Gait is normal, but he walks slightly hunched forward.   Medical Decision Making  Imaging: No recent lumbar imaging.   Assessment and Plan: Mr. Lipe is a pleasant 72 y.o. male who is s/p XLIF L4-L5 on 01/29/21.   He continues with constant LBP that is worse with standing, walking, bending, or twisting. Pain is better with laying flat or  using pain patch. No leg pain. No numbness, tingling, or weakness in the legs.   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 Also with DDD L2-L3 with moderate bilateral foraminal stenosis.   No improvement in facet injections, RFA, and SCS trial with Dr. Cherylann Ratel.   Treatment options discussed with patient and following plan made:   - MRI of lumbar spine ordered to evaluate persistent LBP after above surgery.  - Discussed that we would need to consider PT prior to any surgical discussion.  - He would like to avoid surgery if possible.  - Will schedule follow up visit to review MRI results once I get them back.   I spent a total of 25 minutes in face-to-face and non-face-to-face activities related to this patient's care today including review of outside records, review of imaging, review of symptoms, physical exam, discussion of differential diagnosis, discussion of treatment options, and documentation.   Drake Leach PA-C Dept. of Neurosurgery

## 2023-09-30 ENCOUNTER — Ambulatory Visit: Payer: PPO | Admitting: Orthopedic Surgery

## 2023-09-30 ENCOUNTER — Ambulatory Visit (INDEPENDENT_AMBULATORY_CARE_PROVIDER_SITE_OTHER): Payer: PPO | Admitting: Nurse Practitioner

## 2023-09-30 ENCOUNTER — Encounter: Payer: Self-pay | Admitting: Orthopedic Surgery

## 2023-09-30 ENCOUNTER — Encounter: Payer: Self-pay | Admitting: Nurse Practitioner

## 2023-09-30 ENCOUNTER — Ambulatory Visit: Payer: PPO | Admitting: Neurosurgery

## 2023-09-30 VITALS — BP 130/76 | Ht 74.0 in | Wt 224.0 lb

## 2023-09-30 VITALS — BP 127/62 | HR 66 | Temp 97.7°F | Resp 18 | Ht 74.0 in | Wt 220.0 lb

## 2023-09-30 DIAGNOSIS — M48061 Spinal stenosis, lumbar region without neurogenic claudication: Secondary | ICD-10-CM

## 2023-09-30 DIAGNOSIS — M47816 Spondylosis without myelopathy or radiculopathy, lumbar region: Secondary | ICD-10-CM

## 2023-09-30 DIAGNOSIS — F0280 Dementia in other diseases classified elsewhere without behavioral disturbance: Secondary | ICD-10-CM | POA: Diagnosis not present

## 2023-09-30 DIAGNOSIS — M5136 Other intervertebral disc degeneration, lumbar region with discogenic back pain only: Secondary | ICD-10-CM

## 2023-09-30 DIAGNOSIS — G3109 Other frontotemporal dementia: Secondary | ICD-10-CM | POA: Diagnosis not present

## 2023-09-30 DIAGNOSIS — M4316 Spondylolisthesis, lumbar region: Secondary | ICD-10-CM | POA: Diagnosis not present

## 2023-09-30 DIAGNOSIS — M51361 Other intervertebral disc degeneration, lumbar region with lower extremity pain only: Secondary | ICD-10-CM

## 2023-09-30 DIAGNOSIS — Z981 Arthrodesis status: Secondary | ICD-10-CM

## 2023-09-30 DIAGNOSIS — J301 Allergic rhinitis due to pollen: Secondary | ICD-10-CM

## 2023-09-30 DIAGNOSIS — I10 Essential (primary) hypertension: Secondary | ICD-10-CM

## 2023-09-30 DIAGNOSIS — E782 Mixed hyperlipidemia: Secondary | ICD-10-CM

## 2023-09-30 DIAGNOSIS — I5020 Unspecified systolic (congestive) heart failure: Secondary | ICD-10-CM | POA: Diagnosis not present

## 2023-09-30 MED ORDER — LEVOCETIRIZINE DIHYDROCHLORIDE 5 MG PO TABS: 5.0000 mg | ORAL_TABLET | Freq: Every evening | ORAL | 4 refills | Status: DC

## 2023-09-30 NOTE — Assessment & Plan Note (Signed)
Chronic, progressive.  Diagnosed 04/15/22, stable at this time.  Family history of dementia in mother and father.  At this time continue collaboration with neurology as needed and current Aricept dosing - they do not wish to increase to 10 MG.  Continue B12 supplement.

## 2023-09-30 NOTE — Assessment & Plan Note (Signed)
Chronic, ongoing with recent increase sinus headaches.  Recommend we trial a change to Xyzal and see if more benefit + continue Flonase daily.  Monitor closely and worsening to alert PCP.

## 2023-09-30 NOTE — Patient Instructions (Signed)
It was so nice to see you today. Thank you so much for coming in.    I want to get an MRI of your lower back to look into things further. We will get this approved through your insurance and [facility] will call you to schedule the appointment.   [facility] (building with the white pillars) is located off of [address]. The address is [address], [place], [place] [address].    After you have the MRI, it takes 10-14 days for me to get the results back. Once I have them, we will call you to schedule a follow up visit with me to review them.   Please do not hesitate to call if you have any questions or concerns. You can also message me in MyChart.   Drake Leach PA-C [phone]     The physicians and staff at [facility] are committed to providing excellent care. You may receive a survey asking for feedback about your experience at our office. We value you your feedback and appreciate you taking the time to to fill it out. The [facility] leadership team is also available to discuss your experience in person, feel free to contact us [phone].

## 2023-09-30 NOTE — Assessment & Plan Note (Signed)
Chronic, ongoing.  Continue Rosuvastatin daily.  Adjust dose as needed.  Obtain labs today: CMP and Lipid.

## 2023-09-30 NOTE — Assessment & Plan Note (Signed)
Chronic.  Diagnosed on 06/30/22 with EF 30-35%.  Followed by cardiology.  Euvolemic today.  Educated his wife and him on HF.  They have grant to assist in covering [place] and [place], they will reach out to Dr. Mariah Milling to see if he can restart these.  Recommend: - Reminded to call for an overnight weight gain of >2 pounds or a weekly weight gain of >5 pounds - not adding salt to food and read food labels. Reviewed the importance of keeping daily sodium intake to 2000mg  daily.  - No NSAIDS

## 2023-09-30 NOTE — Assessment & Plan Note (Signed)
Chronic, ongoing.  BP at goal today.  Continue current medication regimen and collaboration with cardiology.  Recommend he monitor BP at least a few mornings a week at home and document.  DASH diet at home.  Labs today: CMP.

## 2023-09-30 NOTE — Assessment & Plan Note (Signed)
Chronic, continue collaboration with pain management and neurosurgery.  Recent notes reviewed.

## 2023-09-30 NOTE — Progress Notes (Signed)
BP 127/62   Pulse 66   Temp 97.7 F (36.5 C) (Oral)   Resp 18   Ht 6\' 2"  (1.88 m)   Wt 220 lb (99.8 kg)   SpO2 96%   BMI 28.25 kg/m    Subjective:    Patient ID: Christopher Huang, male    DOB: [birth year], 72 y.o.   MRN: [id number]  HPI: Christopher Huang is a 72 y.o. male  Chief Complaint  Patient presents with   Hyperlipidemia   Hypertension   Congestive Heart Failure   Dementia   HYPERTENSION / HYPERLIPIDEMIA/HF Saw cardiology last 07/12/23.  Continues on Carvedilol, Rosuvastatin, and Valsartan.  Had to stop Sherryll Burger and Jardiance due to cost.  Is working with PharmD Elnita Maxwell with [facility] to restart these. Satisfied with current treatment? yes Duration of hypertension: chronic BP monitoring frequency: rarely BP range:  BP medication side effects: no Duration of hyperlipidemia: chronic Cholesterol medication side effects: no Cholesterol supplements: none  Medication compliance: good compliance Aspirin: yes Recent stressors: no Recurrent headaches: no Visual changes: no Palpitations: no Dyspnea: no Chest pain: no Lower extremity edema: no Dizzy/lightheaded: no   DEMENTIA: Had initial presentation 03/27/22 and imaging was performed 04/15/22 noting asymmetric left temporal lobe atrophy, ?semantic dementia. Saw neurology on 05/05/22, Dr. Delena Bali, to continue on Donepezil and perform neuropsychological testing.  Tolerating Aricept.  Does have headaches a lot in afternoon across upper eyebrows, going for eye exam soon and has sinus issues.  Both of his parents had mild dementia, both lived into upper 37's.  His wife and him report no recent worsening in memory.  History of back surgery in March 2022.  He is seeing pain clinic, last 07/22/23 and is had ablation of nerves. Going to neurosurgery.  Not doing walking at present or as active as previously was.    03/15/2023    1:48 PM 03/27/2022    8:22 AM 02/07/2021    3:23 PM 01/26/2019   10:03 AM 11/12/2017    2:29 PM  6CIT Screen  What  Year? 0 points 0 points 0 points 0 points 0 points  What month? 3 points 0 points 0 points 0 points 0 points  What time? 3 points 0 points 0 points 0 points 0 points  Count back from 20 0 points 0 points 0 points 0 points 0 points  Months in reverse 4 points 4 points 2 points 0 points 0 points  Repeat phrase 10 points 10 points 0 points 0 points 0 points  Total Score 20 points 14 points 2 points 0 points 0 points      09/30/2023    9:00 AM 03/30/2023    8:42 AM 03/15/2023    1:42 PM 01/25/2023    1:50 PM 12/21/2022   10:21 AM  Depression screen PHQ 2/9  Decreased Interest 1 0 0 0 0  Down, Depressed, Hopeless 0 0 0 0 0  PHQ - 2 Score 1 0 0 0 0  Altered sleeping 0 0 0    Tired, decreased energy 1 0 0    Change in appetite 0 0 0    Feeling bad or failure about yourself  0 0 0    Trouble concentrating 0 0 0    Moving slowly or fidgety/restless 0 0 0    Suicidal thoughts 0 0 0    PHQ-9 Score 2 0 0    Difficult doing work/chores Not difficult at all Not difficult at all Not difficult at all  09/30/2023    9:00 AM 03/30/2023    8:42 AM 10/28/2022    8:27 AM 05/28/2022   10:32 AM  GAD 7 : Generalized Anxiety Score  Nervous, Anxious, on Edge 0 0 0 0  Control/stop worrying 0 0 0 0  Worry too much - different things 0 0 1 0  Trouble relaxing 0 0 0 0  Restless 0 0 0 0  Easily annoyed or irritable 0 0 0 0  Afraid - awful might happen 0 0 0 0  Total GAD 7 Score 0 0 1 0  Anxiety Difficulty Not difficult at all Not difficult at all Somewhat difficult Not difficult at all   Relevant past medical, surgical, family and social history reviewed and updated as indicated. Interim medical history since our last visit reviewed. Allergies and medications reviewed and updated.  Review of Systems  Constitutional:  Negative for activity change, appetite change, diaphoresis, fatigue and fever.  Respiratory:  Negative for cough, chest tightness, shortness of breath and wheezing.   Cardiovascular:   Negative for chest pain, palpitations and leg swelling.  Gastrointestinal: Negative.   Endocrine: Negative.   Neurological:  Positive for headaches. Negative for dizziness, syncope, weakness, light-headedness and numbness.  Psychiatric/Behavioral: Negative.  Negative for decreased concentration, self-injury, sleep disturbance and suicidal ideas. The patient is not nervous/anxious.     Per HPI unless specifically indicated above     Objective:    BP 127/62   Pulse 66   Temp 97.7 F (36.5 C) (Oral)   Resp 18   Ht 6\' 2"  (1.88 m)   Wt 220 lb (99.8 kg)   SpO2 96%   BMI 28.25 kg/m   Wt Readings from Last 3 Encounters:  09/30/23 224 lb (101.6 kg)  09/30/23 220 lb (99.8 kg)  07/22/23 221 lb (100.2 kg)    Physical Exam Vitals and nursing note reviewed.  Constitutional:      General: He is awake. He is not in acute distress.    Appearance: Normal appearance. He is well-developed and well-groomed. He is not ill-appearing or toxic-appearing.  HENT:     Head: Normocephalic and atraumatic.     Right Ear: Hearing and external ear normal. No drainage.     Left Ear: Hearing and external ear normal. No drainage.  Eyes:     General: Lids are normal.        Right eye: No discharge.        Left eye: No discharge.     Conjunctiva/sclera: Conjunctivae normal.     Pupils: Pupils are equal, round, and reactive to light.  Neck:     Thyroid: No thyromegaly.     Vascular: No carotid bruit.  Cardiovascular:     Rate and Rhythm: Normal rate and regular rhythm.     Heart sounds: Normal heart sounds, S1 normal and S2 normal. No murmur heard.    No gallop.     Comments: Occasional extra beats noted on auscultation. Pulmonary:     Effort: Pulmonary effort is normal. No accessory muscle usage or respiratory distress.     Breath sounds: Normal breath sounds. No decreased breath sounds, wheezing or rhonchi.  Abdominal:     General: Abdomen is flat. Bowel sounds are normal. There is no distension.      Palpations: Abdomen is soft. There is no hepatomegaly or splenomegaly.     Tenderness: There is no abdominal tenderness.  Musculoskeletal:        General: Normal range of motion.  Cervical back: Normal range of motion and neck supple.     Right lower leg: No edema.     Left lower leg: No edema.  Lymphadenopathy:     Cervical: No cervical adenopathy.  Skin:    General: Skin is warm and dry.     Capillary Refill: Capillary refill takes less than 2 seconds.  Neurological:     Mental Status: He is alert and oriented to person, place, and time.     Deep Tendon Reflexes: Reflexes are normal and symmetric.     Comments: Oriented to month and day of week.  Able to state President.  Unable to report year.  Psychiatric:        Attention and Perception: Attention normal.        Mood and Affect: Mood normal.        Speech: Speech normal.        Behavior: Behavior normal. Behavior is cooperative.        Thought Content: Thought content normal.    Results for orders placed or performed in visit on 07/22/23  ECHOCARDIOGRAM COMPLETE  Result Value Ref Range   Weight 3,536 oz   Height 73 in   BP 115/80 mmHg   S' Lateral 4.80 cm   Area-P 1/2 4.26 cm2   Est EF 30 - 35%       Assessment & Plan:   Problem List Items Addressed This Visit       Cardiovascular and Mediastinum   Heart failure with reduced ejection fraction (HCC)    Chronic.  Diagnosed on 06/30/22 with EF 30-35%.  Followed by cardiology.  Euvolemic today.  Educated his wife and him on HF.  They have grant to assist in covering [place], they will reach out to Dr. Mariah Milling to see if he can restart these.  Recommend: - Reminded to call for an overnight weight gain of >2 pounds or a weekly weight gain of >5 pounds - not adding salt to food and read food labels. Reviewed the importance of keeping daily sodium intake to 2000mg  daily.  - No NSAIDS      Relevant Medications   valsartan (DIOVAN) 160 MG tablet    Hypertension    Chronic, ongoing.  BP at goal today.  Continue current medication regimen and collaboration with cardiology.  Recommend he monitor BP at least a few mornings a week at home and document.  DASH diet at home.  Labs today: CMP.         Relevant Medications   valsartan (DIOVAN) 160 MG tablet     Respiratory   Allergic rhinitis    Chronic, ongoing with recent increase sinus headaches.  Recommend we trial a change to Xyzal and see if more benefit + continue Flonase daily.  Monitor closely and worsening to alert PCP.        Nervous and Auditory   Frontotemporal dementia (HCC) - Primary    Chronic, progressive.  Diagnosed 04/15/22, stable at this time.  Family history of dementia in mother and father.  At this time continue collaboration with neurology as needed and current Aricept dosing - they do not wish to increase to 10 MG.  Continue B12 supplement.        Other   Hyperlipidemia    Chronic, ongoing.  Continue Rosuvastatin daily.  Adjust dose as needed.  Obtain labs today: CMP and Lipid.        Relevant Medications   valsartan (DIOVAN) 160 MG tablet  Other Relevant Orders   Comprehensive metabolic panel   Lipid Panel w/o Chol/HDL Ratio   Spinal stenosis, lumbar region, without neurogenic claudication (L3/4)    Chronic, continue collaboration with pain management and neurosurgery.  Recent notes reviewed.        Follow up plan: Return in about 3 months (around 12/31/2023) for Dementia and Headaches.

## 2023-10-01 ENCOUNTER — Encounter: Payer: Self-pay | Admitting: Cardiovascular Disease

## 2023-10-01 LAB — COMPREHENSIVE METABOLIC PANEL
ALT: 16 [IU]/L (ref 0–44)
AST: 19 [IU]/L (ref 0–40)
Albumin: 4.4 g/dL (ref 3.8–4.8)
Alkaline Phosphatase: 100 [IU]/L (ref 44–121)
BUN/Creatinine Ratio: 13 (ref 10–24)
BUN: 12 mg/dL (ref 8–27)
Bilirubin Total: 0.5 mg/dL (ref 0.0–1.2)
CO2: 25 mmol/L (ref 20–29)
Calcium: 9.1 mg/dL (ref 8.6–10.2)
Chloride: 103 mmol/L (ref 96–106)
Creatinine, Ser: 0.91 mg/dL (ref 0.76–1.27)
Globulin, Total: 1.9 g/dL (ref 1.5–4.5)
Glucose: 88 mg/dL (ref 70–99)
Potassium: 4.7 mmol/L (ref 3.5–5.2)
Sodium: 142 mmol/L (ref 134–144)
Total Protein: 6.3 g/dL (ref 6.0–8.5)
eGFR: 90 mL/min/{1.73_m2} (ref >59)

## 2023-10-01 LAB — LIPID PANEL W/O CHOL/HDL RATIO
Cholesterol, Total: 135 mg/dL (ref 100–199)
HDL: 50 mg/dL (ref >39)
LDL Chol Calc (NIH): 49 mg/dL (ref 0–99)
Triglycerides: 228 mg/dL — ABNORMAL HIGH (ref 0–149)
VLDL Cholesterol Cal: 36 mg/dL (ref 5–40)

## 2023-10-01 MED ORDER — EMPAGLIFLOZIN 10 MG PO TABS: 10.0000 mg | ORAL_TABLET | Freq: Every day | ORAL | 3 refills | Status: AC

## 2023-10-01 MED ORDER — ENTRESTO 49-51 MG PO TABS: 1.0000 | ORAL_TABLET | Freq: Two times a day (BID) | ORAL | 3 refills | Status: DC

## 2023-10-01 NOTE — Progress Notes (Signed)
Contacted via MyChart   Good afternoon Christopher Huang, your labs have returned: - Kidney function, creatinine and eGFR, remains normal, as is liver function, AST and ALT.  - Cholesterol levels are stable with exception of some elevation in triglycerides. Continue all current medication and we will recheck next visit fasting labs.  Focus on healthy diet too.  Any questions? Keep being stellar!!  Thank you for allowing me to participate in your care.  I appreciate you. Kindest regards, Quadry Kampa

## 2023-10-04 DIAGNOSIS — R519 Headache, unspecified: Secondary | ICD-10-CM | POA: Diagnosis not present

## 2023-10-04 DIAGNOSIS — Z961 Presence of intraocular lens: Secondary | ICD-10-CM | POA: Diagnosis not present

## 2023-10-04 DIAGNOSIS — H43813 Vitreous degeneration, bilateral: Secondary | ICD-10-CM | POA: Diagnosis not present

## 2023-10-04 DIAGNOSIS — H5052 Exophoria: Secondary | ICD-10-CM | POA: Diagnosis not present

## 2023-10-04 DIAGNOSIS — H524 Presbyopia: Secondary | ICD-10-CM | POA: Diagnosis not present

## 2023-10-04 DIAGNOSIS — H35373 Puckering of macula, bilateral: Secondary | ICD-10-CM | POA: Diagnosis not present

## 2023-10-13 ENCOUNTER — Ambulatory Visit
Admission: RE | Admit: 2023-10-13 | Discharge: 2023-10-13 | Disposition: A | Discharge status: Home / Self Care | Payer: PPO | Source: Ambulatory Visit | Attending: Orthopedic Surgery | Admitting: Orthopedic Surgery

## 2023-10-13 DIAGNOSIS — M47816 Spondylosis without myelopathy or radiculopathy, lumbar region: Secondary | ICD-10-CM | POA: Insufficient documentation

## 2023-10-13 DIAGNOSIS — Z981 Arthrodesis status: Secondary | ICD-10-CM | POA: Diagnosis not present

## 2023-10-13 DIAGNOSIS — M4316 Spondylolisthesis, lumbar region: Secondary | ICD-10-CM | POA: Insufficient documentation

## 2023-10-13 DIAGNOSIS — M5126 Other intervertebral disc displacement, lumbar region: Secondary | ICD-10-CM | POA: Diagnosis not present

## 2023-10-13 DIAGNOSIS — M48061 Spinal stenosis, lumbar region without neurogenic claudication: Secondary | ICD-10-CM | POA: Insufficient documentation

## 2023-10-13 DIAGNOSIS — M4807 Spinal stenosis, lumbosacral region: Secondary | ICD-10-CM | POA: Diagnosis not present

## 2023-10-13 DIAGNOSIS — M5137 Other intervertebral disc degeneration, lumbosacral region with discogenic back pain only: Secondary | ICD-10-CM | POA: Diagnosis not present

## 2023-11-15 ENCOUNTER — Other Ambulatory Visit: Payer: Self-pay | Admitting: Nurse Practitioner

## 2023-11-16 NOTE — Telephone Encounter (Signed)
Requested Prescriptions  Pending Prescriptions Disp Refills   donepezil (ARICEPT) 5 MG tablet [Pharmacy Med Name: DONEPEZIL HCL 5 MG TABLET] 90 tablet 0    Sig: TAKE 1 TABLET BY MOUTH EVERYDAY AT BEDTIME     Neurology:  Alzheimer's Agents Passed - 11/16/2023  1:58 PM      Passed - Valid encounter within last 6 months    Recent Outpatient Visits           1 month ago Frontotemporal dementia (HCC)   [place] [facility] [place], Corrie Dandy T, NP   7 months ago Frontotemporal dementia (HCC)   [place] [facility] [place], Corrie Dandy T, NP   1 year ago Frontotemporal dementia (HCC)   [place] [facility] [place], Corrie Dandy T, NP   1 year ago Semantic dementia [facility])   [place] [facility] [place], Corrie Dandy T, NP   1 year ago Upper respiratory tract infection, unspecified type   [place] [facility] Vigg, Avanti, MD       Future Appointments             In 1 month Cannady, Dorie Rank, NP  [facility], [facility]

## 2023-11-17 ENCOUNTER — Other Ambulatory Visit: Payer: Self-pay | Admitting: Cardiovascular Disease

## 2023-11-17 ENCOUNTER — Other Ambulatory Visit: Payer: Self-pay | Admitting: Nurse Practitioner

## 2023-11-18 NOTE — Telephone Encounter (Signed)
Labs in date.    Requested Prescriptions  Pending Prescriptions Disp Refills   rosuvastatin (CRESTOR) 40 MG tablet [Pharmacy Med Name: ROSUVASTATIN CALCIUM 40 MG TAB] 90 tablet 0    Sig: TAKE 1 TABLET (40 MG TOTAL) BY MOUTH DAILY. STOP TAKING 20 MG TABLETS.     Cardiovascular:  Antilipid - Statins 2 Failed - 11/18/2023  2:49 PM      Failed - Lipid Panel in normal range within the last 12 months    Cholesterol, Total  Date Value Ref Range Status  09/30/2023 135 100 - 199 mg/dL Final   Cholesterol Piccolo, Waived  Date Value Ref Range Status  07/15/2017 185 <200 mg/dL Final    Comment:                            Desirable                <200                         Borderline High      200- 239                         High                     >239    LDL Chol Calc (NIH)  Date Value Ref Range Status  09/30/2023 49 0 - 99 mg/dL Final   HDL  Date Value Ref Range Status  09/30/2023 50 >39 mg/dL Final   Triglycerides  Date Value Ref Range Status  09/30/2023 228 (H) 0 - 149 mg/dL Final   Triglycerides Piccolo,Waived  Date Value Ref Range Status  07/15/2017 223 (H) <150 mg/dL Final    Comment:                            Normal                   <150                         Borderline High     150 - 199                         High                200 - 499                         Very High                >499          Passed - Cr in normal range and within 360 days    Creatinine, Ser  Date Value Ref Range Status  09/30/2023 0.91 0.76 - 1.27 mg/dL Final         Passed - Patient is not pregnant      Passed - Valid encounter within last 12 months    Recent Outpatient Visits           1 month ago Frontotemporal dementia (HCC)   [place] [facility] [place], Corrie Dandy T, NP   7 months ago Frontotemporal dementia [facility])   [place] [facility] [place], [place]  T, NP   1 year ago Frontotemporal dementia (HCC)   [place] [facility] [place], Dorie Rank, NP   1 year ago Semantic dementia [facility])   [place] [facility] [place], Corrie Dandy T, NP   1 year ago Upper respiratory tract infection, unspecified type   [place] [facility] Vigg, Avanti, MD       Future Appointments             In 1 month Cannady, Dorie Rank, NP [facility], [facility]

## 2023-11-26 NOTE — Progress Notes (Addendum)
 Referring Physician:  Valerio Melanie DASEN, NP [address] [place],  [place] [address]  Primary Physician:  Valerio Melanie DASEN, NP  History of Present Illness: [name] has a history of HTN, heart failure, dementia, prostate CA, hyplipidemia.   He is s/p XLIF L4-L5 on 01/29/21.  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 Also with DDD L2-L3 with moderate bilateral foraminal stenosis.   He's had facet injections, RFA, and SCS trial with no improvement in his pain.   He is here to review his lumbar MRI scan. He is about the same.   He continues with constant LBP that is worse with standing, walking, bending, or twisting. Pain is better with laying flat, sitting, or using pain patch. No leg pain. No numbness, tingling, or weakness in the legs.   He is taking tylenol . Cardiology has told him to avoid all other NSAIDs. Some relief with salonpas patches.   No help last year with facet injections or SCS trial. Some relief with OTC lumbar brace.   Bowel/Bladder Dysfunction: none  Conservative measures:  Physical therapy: none  Multimodal medical therapy including regular antiinflammatories: tylenol   Injections:  Perc trial SCS on 07/14/23 with removal on 07/22/23 Bilateral RFA L2-L4 on 04/07/23 Bilateral MBB L2-L4 on 02/17/23 Bilateral MBB L2-L4 on 12/21/22  Past Surgery:  XLIF L4-L5 on 01/29/21  The symptoms are causing a significant impact on the patient's life.   Review of Systems:  A 10 point review of systems is negative, except for the pertinent positives and negatives detailed in the HPI.  Past Medical History: Past Medical History:  Diagnosis Date   Allergic rhinitis    Cancer of prostate (HCC) 02/2007   s/p surgery   Dementia (HCC)    History of kidney stones    H/O   Hyperlipidemia    Hypertension    Personal history of kidney stones     Past Surgical History: Past Surgical History:   Procedure Laterality Date   ANTERIOR LATERAL LUMBAR FUSION WITH PERCUTANEOUS SCREW 1 LEVEL N/A 01/29/2021   Procedure: L4-5 LATERAL INTERBODY FUSION, L4-5 POSTERIOR FUSION;  Surgeon: Clois Fret, MD;  Location: [facility] ORS;  Service: Neurosurgery;  Laterality: N/A;   basal skin cancers     CATARACT EXTRACTION, BILATERAL     2021   COLONOSCOPY WITH PROPOFOL  N/A 11/09/2017   Procedure: COLONOSCOPY WITH PROPOFOL ;  Surgeon: Jinny Carmine, MD;  Location: [facility] ENDOSCOPY;  Service: Endoscopy;  Laterality: N/A;   COLONOSCOPY WITH PROPOFOL  N/A 12/01/2022   Procedure: COLONOSCOPY WITH PROPOFOL ;  Surgeon: Jinny Carmine, MD;  Location: [facility] ENDOSCOPY;  Service: Endoscopy;  Laterality: N/A;   EYE SURGERY Bilateral    Cataract   HAND SURGERY     THUMB SURGERY   KNEE ARTHROSCOPY     prostate cancer removed     TONSILLECTOMY     TONSILLECTOMY     TOTAL KNEE ARTHROPLASTY Left 02/09/2020   Procedure: TOTAL KNEE ARTHROPLASTY - RNFA;  Surgeon: Kathlynn Sharper, MD;  Location: [facility] ORS;  Service: Orthopedics;  Laterality: Left;    Allergies: Allergies as of 12/02/2023 - Review Complete 09/30/2023  Allergen Reaction Noted   Percocet [oxycodone -acetaminophen ] Nausea And Vomiting 03/27/2015   Tramadol Nausea And Vomiting 03/27/2015   Vicodin [hydrocodone-acetaminophen ] Nausea And Vomiting 03/27/2015    Medications: Outpatient Encounter Medications as of 12/02/2023  Medication Sig   Acetaminophen  (ACETAMINOPHEN  EXTRA STRENGTH) 500 MG capsule Take 1 capsule by mouth every  6 (six) hours as needed for fever.   ASPIRIN  81 PO Take 81 mg by mouth daily.   carvedilol  (COREG ) 3.125 MG tablet TAKE 1 TABLET BY MOUTH TWICE A DAY   Cholecalciferol  (VITAMIN D3) 50 MCG (2000 UT) TABS Take 2,000 Units by mouth daily.   Coenzyme Q10 100 MG TABS Take 100 mg by mouth daily.   docusate sodium  (COLACE) 100 MG capsule Take 100 mg by mouth daily.   donepezil  (ARICEPT ) 5 MG tablet TAKE 1 TABLET BY MOUTH EVERYDAY AT BEDTIME    empagliflozin  (JARDIANCE ) 10 MG TABS tablet Take 1 tablet (10 mg total) by mouth daily before breakfast.   fluticasone  (FLONASE ) 50 MCG/ACT nasal spray PLACE 2 SPRAYS INTO EACH NOSTRIL DAILY.   levocetirizine (XYZAL ) 5 MG tablet Take 1 tablet (5 mg total) by mouth every evening.   Misc Natural Products (RELAX & SLEEP PO) Take by mouth. Reports taking Relaxium nightly for sleep (contains 5mg  Melatonin, 100mg  Magnesium , L-Tryptophan, Valerest, Ashwagandha, 100mg  GABA, Chamomile and Passionflower)   Multiple Vitamin (MULTIVITAMIN WITH MINERALS) TABS tablet Take 1 tablet by mouth daily. Men's Multivitamin 50+   rosuvastatin  (CRESTOR ) 40 MG tablet TAKE 1 TABLET (40 MG TOTAL) BY MOUTH DAILY. STOP TAKING 20 MG TABLETS.   sacubitril -valsartan  (ENTRESTO ) 49-51 MG Take 1 tablet by mouth 2 (two) times daily.   vitamin B-12 (CYANOCOBALAMIN ) 500 MCG tablet Take 500 mcg by mouth daily.   No facility-administered encounter medications on file as of 12/02/2023.    Social History: Social History   Tobacco Use   Smoking status: Never   Smokeless tobacco: Never  Vaping Use   Vaping status: Never Used  Substance Use Topics   Alcohol use: No   Drug use: No    Family Medical History: Family History  Problem Relation Age of Onset   Dementia Mother    Prostate cancer Father     Physical Examination: There were no vitals filed for this visit.    Awake, alert, oriented to person, place, and time.  Speech is clear and fluent. Fund of knowledge is appropriate.   Cranial Nerves: Pupils equal round and reactive to light.  Facial tone is symmetric.    No lower lumbar tenderness.   No abnormal lesions on exposed skin.   Strength:  Side Iliopsoas Quads Hamstring PF DF EHL  R 5 5 5 5 5 5   L 5 5 5 5 5 5    Reflexes are 2+ and symmetric at the patella and achilles.    Clonus is not present.   Bilateral lower extremity sensation is intact to light touch.     Gait is normal, but he walks slightly  hunched forward.   Medical Decision Making  Imaging: Lumbar MRI dated 10/13/23:  FINDINGS: Segmentation: Transitional lumbosacral anatomy with a partially lumbarized S1 segment, as before. Utilizing this numbering, the patient has undergone previous PLIF at L4-5.   Alignment: The alignment is stable with a mild scoliosis, a grade 1 retrolisthesis at L2-3 and a grade 1 anterolisthesis at L4-5 and L5-S1.   Vertebrae: No worrisome osseous lesion, acute fracture or pars defect. Stable L3 hemangioma. Stable postsurgical changes from L4-5 PLIF.   Conus medullaris: Extends to the T12-L1 level and appears normal.   Paraspinal and other soft tissues: No significant paraspinal findings.   Disc levels:   Sagittal images demonstrate no significant disc space findings within the visualized lower thoracic spine.   L1-2: Only imaged in the sagittal plane. Preserved disc height with mild bilateral  facet hypertrophy. No significant spinal stenosis or nerve root encroachment.   L2-3: Stable chronic loss of disc height with annular disc bulging and endplate osteophytes asymmetric to the right. Moderate facet and ligamentous hypertrophy. Unchanged mild multifactorial spinal stenosis with asymmetric narrowing of the right lateral recess, moderate right and mild left foraminal narrowing.   L3-4: Mild loss of disc height with annular disc bulging and endplate osteophytes asymmetric to the right. There advanced facet and ligamentous hypertrophy contributing to stable moderate to severe multifactorial spinal stenosis. Mild lateral recess and foraminal narrowing, right greater than left, is unchanged.   L4-5: Previous posterior decompression and PLIF. The spinal canal and neural foramina are widely patent.   L5-S1: Chronic mild loss of disc height with annular disc bulging and endplate osteophytes asymmetric to the left. Mild right and moderate left foraminal narrowing. The spinal canal,  lateral recesses and right foramen remain widely patent. There is chronic moderate left foraminal narrowing.   S1-2: Transitional disc space level appears stable, without spinal stenosis or nerve root encroachment.   IMPRESSION: 1. No acute findings or significant changes compared with previous study from 11 months prior. 2. Stable mild multifactorial spinal stenosis at L2-3 with asymmetric narrowing of the right lateral recess, moderate right and mild left foraminal narrowing. 3. Stable moderate to severe multifactorial spinal stenosis at L3-4 with mild lateral recess and foraminal narrowing, right greater than left. 4. Stable postsurgical changes at L4-5 without residual or recurrent spinal stenosis. 5. Stable moderate chronic left foraminal narrowing at L5-S1.     Electronically Signed   By: Elsie Perone M.D.   On: 10/31/2023 17:23   I have personally reviewed the images and agree with the above interpretation.   Assessment and Plan: Mr. Coston is a pleasant 73 y.o. male who is s/p XLIF L4-L5 on 01/29/21.   He continues with constant LBP that is worse with standing, walking, bending, or twisting. Pain is better with laying flat, sitting, or using pain patch. No leg pain. No numbness, tingling, or weakness in the legs.   He has known adjacent level disease at L3-L4 with moderate/severe central stenosis, slip, and bilateral foraminal stenosis. Also with DDD L2-L3 with moderate bilateral foraminal stenosis and mild central stenosis.   No improvement in facet injections, RFA, and SCS trial with Dr. Marcelino.   Treatment options discussed with patient and following plan made:   - PT for lumbar spine. Orders to [facility].  - He does not want to consider further injections.  - Follow up with Dr. Clois after he does 6 weeks of PT to discuss possible surgery options. Will message him in 4 weeks to check on progress with PT and likely schedule f/u with Dr. Clois at that time. They  will let me know if he is discharged from PT.   I spent a total of 20 minutes in face-to-face and non-face-to-face activities related to this patient's care today including review of outside records, review of imaging, review of symptoms, physical exam, discussion of differential diagnosis, discussion of treatment options, and documentation.   Glade Boys PA-C Dept. of Neurosurgery

## 2023-12-02 ENCOUNTER — Ambulatory Visit: Payer: PPO | Admitting: Orthopedic Surgery

## 2023-12-02 ENCOUNTER — Encounter: Payer: Self-pay | Admitting: Orthopedic Surgery

## 2023-12-02 VITALS — BP 130/80 | Ht 74.0 in | Wt 224.0 lb

## 2023-12-02 DIAGNOSIS — M47816 Spondylosis without myelopathy or radiculopathy, lumbar region: Secondary | ICD-10-CM

## 2023-12-02 DIAGNOSIS — Z981 Arthrodesis status: Secondary | ICD-10-CM | POA: Diagnosis not present

## 2023-12-02 DIAGNOSIS — M48061 Spinal stenosis, lumbar region without neurogenic claudication: Secondary | ICD-10-CM

## 2023-12-02 DIAGNOSIS — M5136 Other intervertebral disc degeneration, lumbar region with discogenic back pain only: Secondary | ICD-10-CM

## 2023-12-02 DIAGNOSIS — M4316 Spondylolisthesis, lumbar region: Secondary | ICD-10-CM | POA: Diagnosis not present

## 2023-12-02 NOTE — Patient Instructions (Signed)
 It was so nice to see you today. Thank you so much for coming in.    You have some wear and tear in your back, especially above your fusion. This is likely what is causing your pain.   I sent physical therapy orders to [facility]. You can call them at the number below if you don't hear from them to schedule your visit.   I will message you in 4-5 weeks to check on you and then likely will schedule you to see Dr. Clois. Let me know if PT discharges you.   I am sorry about your dog- I know she had a great life and was so loved by you both!  Please do not hesitate to call if you have any questions or concerns. You can also message me in MyChart.   Glade Boys PA-C [phone]     The physicians and staff at [facility] are committed to providing excellent care. You may receive a survey asking for feedback about your experience at our office. We value you your feedback and appreciate you taking the time to to fill it out. The [facility] leadership team is also available to discuss your experience in person, feel free to contact us  [phone].

## 2023-12-06 ENCOUNTER — Ambulatory Visit: Payer: PPO | Attending: Orthopedic Surgery

## 2023-12-06 DIAGNOSIS — M47816 Spondylosis without myelopathy or radiculopathy, lumbar region: Secondary | ICD-10-CM | POA: Insufficient documentation

## 2023-12-06 DIAGNOSIS — M5459 Other low back pain: Secondary | ICD-10-CM | POA: Diagnosis not present

## 2023-12-06 DIAGNOSIS — M6281 Muscle weakness (generalized): Secondary | ICD-10-CM | POA: Diagnosis not present

## 2023-12-06 DIAGNOSIS — R262 Difficulty in walking, not elsewhere classified: Secondary | ICD-10-CM | POA: Diagnosis not present

## 2023-12-06 DIAGNOSIS — M4316 Spondylolisthesis, lumbar region: Secondary | ICD-10-CM | POA: Diagnosis not present

## 2023-12-06 DIAGNOSIS — M48061 Spinal stenosis, lumbar region without neurogenic claudication: Secondary | ICD-10-CM | POA: Insufficient documentation

## 2023-12-06 DIAGNOSIS — M5136 Other intervertebral disc degeneration, lumbar region with discogenic back pain only: Secondary | ICD-10-CM | POA: Diagnosis not present

## 2023-12-06 DIAGNOSIS — Z981 Arthrodesis status: Secondary | ICD-10-CM | POA: Insufficient documentation

## 2023-12-06 DIAGNOSIS — R293 Abnormal posture: Secondary | ICD-10-CM | POA: Insufficient documentation

## 2023-12-06 NOTE — Therapy (Signed)
 OUTPATIENT PHYSICAL THERAPY THORACOLUMBAR EVALUATION   Patient Name: Christopher Huang MRN: [id number] DOB:16-Sep-1951, 73 y.o., male Today's Date: 12/06/2023  END OF SESSION:  PT End of Session - 12/06/23 1851     Visit Number 1    Number of Visits 17    Date for PT Re-Evaluation 01/31/24    PT Start Time 1601    PT Stop Time 1646    PT Time Calculation (min) 45 min    Activity Tolerance Patient tolerated treatment well    Behavior During Therapy [facility] for tasks assessed/performed             Past Medical History:  Diagnosis Date   Allergic rhinitis    Cancer of prostate (HCC) 02/2007   s/p surgery   Dementia (HCC)    History of kidney stones    H/O   Hyperlipidemia    Hypertension    Personal history of kidney stones    Past Surgical History:  Procedure Laterality Date   ANTERIOR LATERAL LUMBAR FUSION WITH PERCUTANEOUS SCREW 1 LEVEL N/A 01/29/2021   Procedure: L4-5 LATERAL INTERBODY FUSION, L4-5 POSTERIOR FUSION;  Surgeon: Clois Fret, MD;  Location: [facility] ORS;  Service: Neurosurgery;  Laterality: N/A;   basal skin cancers     CATARACT EXTRACTION, BILATERAL     2021   COLONOSCOPY WITH PROPOFOL  N/A 11/09/2017   Procedure: COLONOSCOPY WITH PROPOFOL ;  Surgeon: Jinny Carmine, MD;  Location: [facility] ENDOSCOPY;  Service: Endoscopy;  Laterality: N/A;   COLONOSCOPY WITH PROPOFOL  N/A 12/01/2022   Procedure: COLONOSCOPY WITH PROPOFOL ;  Surgeon: Jinny Carmine, MD;  Location: [facility] ENDOSCOPY;  Service: Endoscopy;  Laterality: N/A;   EYE SURGERY Bilateral    Cataract   HAND SURGERY     THUMB SURGERY   KNEE ARTHROSCOPY     prostate cancer removed     TONSILLECTOMY     TONSILLECTOMY     TOTAL KNEE ARTHROPLASTY Left 02/09/2020   Procedure: TOTAL KNEE ARTHROPLASTY - RNFA;  Surgeon: Kathlynn Sharper, MD;  Location: [facility] ORS;  Service: Orthopedics;  Laterality: Left;   Patient Active Problem List   Diagnosis Date Noted   Failed back surgical syndrome 03/22/2023   Lumbar facet  arthropathy 12/07/2022   Spinal stenosis, lumbar region, without neurogenic claudication (L3/4) 12/07/2022   Lumbar degenerative disc disease 12/07/2022   Chronic knee pain after total replacement of left knee joint 12/07/2022   Polyp of ascending colon 12/01/2022   Heart failure with reduced ejection fraction (HCC) 10/25/2022   LBBB (left bundle branch block) 05/24/2022   Frontotemporal dementia (HCC) 03/27/2022   Vitamin D  deficiency 09/26/2021   History of back surgery 03/07/2021   Knee joint replacement status, left 02/09/2020   Advanced care planning/counseling discussion 02/02/2019   Arthritis of hand 11/25/2018   History of colonic polyps    Allergic rhinitis    History of prostate cancer    Hyperlipidemia    Hypertension    Personal history of kidney stones     PCP: Cannady, Jolene, T NP  REFERRING PROVIDER: Hilma Hastings, PA-C  REFERRING DIAG: Z98.1 (ICD-10-CM) - S/P lumbar fusionM47.816 (ICD-10-CM) - Lumbar spondylosisM48.061 (ICD-10-CM) - Spinal stenosis of lumbar region, unspecified whether neurogenic claudication presentM43.16 (ICD-10-CM) - Spondylolisthesis of lumbar regionM51.360 (ICD-10-CM) - Degeneration of intervertebral disc of lumbar region with discogenic back pain  Rationale for Evaluation and Treatment: Rehabilitation  THERAPY DIAG:  Other low back pain  Abnormal posture  Muscle weakness (generalized)  Difficulty in walking, not elsewhere classified  ONSET DATE:  Chronic. Has had it for many years.  SUBJECTIVE:                                                                                                                                                                                           SUBJECTIVE STATEMENT: Pt is a 73 y.o. male referred to OPPT for chronic LBP.  PERTINENT HISTORY:  Pt's spouse assisting in subjective. Has had prior lumbar surgery. Sounds like he has had adjacent lumbar changes above his hardware and spouse states they (MD's)  believe this is his source of pain. Spouse reports poor endurance for standing and walking. Pt and spouse state his walking tolerance is only 2-3 minutes. Severe LBP is what is limiting him from progressing, not his LE strength denying neurogenic claudication symptoms. Pain is central, and lower. Reports stabbing pain with bending over, standing, walking. Denies use of a SPC or RW to assist in balance/endurance. Worst pain described worst pain as a 5/10 NPS with aggravating activities. Best pain around 1/10 NPS. Does endorse stiffness and acheiness with weather changes and mornings. Reports forward leaning with walking. Does wear back brace regularly which he states helps his pain.   PAIN:  Are you having pain? Yes: NPRS scale: 1/10 NPS Pain location: Central, lower back Pain description: sharp, stiff, achey Aggravating factors: activity, standing, walking, bending Relieving factors: rest, sitting, back brace  PRECAUTIONS: None  RED FLAGS: Bowel or bladder incontinence: No and Cauda equina syndrome: No   WEIGHT BEARING RESTRICTIONS: No  FALLS:  Has patient fallen in last 6 months? No  LIVING ENVIRONMENT: Lives with: lives with their spouse Lives in: House/apartment Stairs: Yes: External: 3-4 steps; bilateral but cannot reach both Has following equipment at home: None  OCCUPATION: Retired   PLOF: Independent  PATIENT GOALS: To improve his pain and endurance.   NEXT MD VISIT: N/A  OBJECTIVE:  Note: Objective measures were completed at Evaluation unless otherwise noted.  DIAGNOSTIC FINDINGS:  Lumbar MRI dated 10/13/23:  FINDINGS: Segmentation: Transitional lumbosacral anatomy with a partially lumbarized S1 segment, as before. Utilizing this numbering, the patient has undergone previous PLIF at L4-5.   Alignment: The alignment is stable with a mild scoliosis, a grade 1 retrolisthesis at L2-3 and a grade 1 anterolisthesis at L4-5 and L5-S1.   Vertebrae: No worrisome osseous  lesion, acute fracture or pars defect. Stable L3 hemangioma. Stable postsurgical changes from L4-5 PLIF.   Conus medullaris: Extends to the T12-L1 level and appears normal.   Paraspinal and other soft tissues: No significant paraspinal findings.   Disc levels:   Sagittal images demonstrate no significant disc space findings within  the visualized lower thoracic spine.   L1-2: Only imaged in the sagittal plane. Preserved disc height with mild bilateral facet hypertrophy. No significant spinal stenosis or nerve root encroachment.   L2-3: Stable chronic loss of disc height with annular disc bulging and endplate osteophytes asymmetric to the right. Moderate facet and ligamentous hypertrophy. Unchanged mild multifactorial spinal stenosis with asymmetric narrowing of the right lateral recess, moderate right and mild left foraminal narrowing.   L3-4: Mild loss of disc height with annular disc bulging and endplate osteophytes asymmetric to the right. There advanced facet and ligamentous hypertrophy contributing to stable moderate to severe multifactorial spinal stenosis. Mild lateral recess and foraminal narrowing, right greater than left, is unchanged.   L4-5: Previous posterior decompression and PLIF. The spinal canal and neural foramina are widely patent.   L5-S1: Chronic mild loss of disc height with annular disc bulging and endplate osteophytes asymmetric to the left. Mild right and moderate left foraminal narrowing. The spinal canal, lateral recesses and right foramen remain widely patent. There is chronic moderate left foraminal narrowing.   S1-2: Transitional disc space level appears stable, without spinal stenosis or nerve root encroachment.   IMPRESSION: 1. No acute findings or significant changes compared with previous study from 11 months prior. 2. Stable mild multifactorial spinal stenosis at L2-3 with asymmetric narrowing of the right lateral recess, moderate right  and mild left foraminal narrowing. 3. Stable moderate to severe multifactorial spinal stenosis at L3-4 with mild lateral recess and foraminal narrowing, right greater than left. 4. Stable postsurgical changes at L4-5 without residual or recurrent spinal stenosis. 5. Stable moderate chronic left foraminal narrowing at L5-S1.     Electronically Signed   By: Elsie Perone M.D.   On: 10/31/2023 17:23  PATIENT SURVEYS:  FOTO deferred to next session  COGNITION: Overall cognitive status: History of cognitive impairments - at baseline     SENSATION: WFL  MUSCLE LENGTH: Hamstrings: Right 60 deg; Left 60 deg Thomas test: deferred to next session  POSTURE: decreased lumbar lordosis, increased thoracic kyphosis, and hinging at hip flexors in standing  PALPATION: TTP at L1-L3 at spinous process and L sided paraspinals  LUMBAR ROM:   AROM eval  Flexion 75%  Extension 25%*  Right lateral flexion 50%  Left lateral flexion 50%*  Right rotation 100%  Left rotation 100%   (Blank rows = not tested)  LOWER EXTREMITY ROM:     Active  Right eval Left eval  Hip flexion    Hip extension    Hip abduction    Hip adduction    Hip internal rotation    Hip external rotation    Knee flexion    Knee extension    Ankle dorsiflexion    Ankle plantarflexion    Ankle inversion    Ankle eversion     (Blank rows = not tested)  LOWER EXTREMITY MMT:    MMT Right eval Left eval  Hip flexion 5 5  Hip extension 3 2  Hip abduction 3 3  Hip adduction    Hip internal rotation 4 4  Hip external rotation 5 5  Knee flexion 5 5  Knee extension 5 5  Ankle dorsiflexion 5 5  Ankle plantarflexion 5 4  Ankle inversion    Ankle eversion     (Blank rows = not tested)  LUMBAR SPECIAL TESTS:  Straight leg raise test: Negative, Slump test: Negative, and FABER test: Negative  FADDIR: Negative   FUNCTIONAL TESTS:  5 times  sit to stand: 14.81 sec 2 minute walk test: 360'  GAIT: Distance  walked: 300' Assistive device utilized: None Level of assistance: Complete Independence Comments: Crouched gait, decreased R/L foot clearance  TREATMENT DATE: 12/06/23  Reviewed HEP (reps/sets/frequency)                                                                                                                                 PATIENT EDUCATION:  Education details: HEP, POC Person educated: Patient and Spouse Education method: Explanation, Demonstration, and Handouts Education comprehension: verbalized understanding and needs further education  HOME EXERCISE PROGRAM: Access Code: [email or web address] URL: [email or web address] Date: 12/06/2023 Prepared by: Dorina Kingfisher  Exercises - Sit to Stand Without Arm Support  - 1 x daily - 3-4 x weekly - 3 sets - 12 reps - Standing Hip Abduction with Counter Support  - 1 x daily - 3-4 x weekly - 3 sets - 12 reps - Seated Hamstring Stretch  - 1 x daily - 7 x weekly - 1 sets - 2 reps - 30 hold  ASSESSMENT:  CLINICAL IMPRESSION: Patient is a 73 y.o. male who was seen today for physical therapy evaluation and treatment for chronic LBP. Pt presents with deficits in postural abnormality, proximal hip weakness, lumbar AROM impairments, and decreased walking tolerance. These impairments are limiting pt's ability to recreationally fish, perform community walking distances, and bending over ADL's. Pt will benefit from skilled PT services to address these impairments and maximize independence with functional mobility.   OBJECTIVE IMPAIRMENTS: Abnormal gait, decreased activity tolerance, decreased balance, decreased endurance, decreased mobility, difficulty walking, decreased ROM, decreased strength, impaired flexibility, postural dysfunction, and pain.   ACTIVITY LIMITATIONS: carrying, lifting, bending, standing, squatting, stairs, and locomotion level  PARTICIPATION LIMITATIONS: community activity  PERSONAL FACTORS: Age, Past/current  experiences, Time since onset of injury/illness/exacerbation, and 3+ comorbidities: Dementia, hx of prostate cancer, HLD, HTN   are also affecting patient's functional outcome.   REHAB POTENTIAL: Fair Chronicity of symptoms  CLINICAL DECISION MAKING: Evolving/moderate complexity  EVALUATION COMPLEXITY: Moderate   GOALS: Goals reviewed with patient? No  SHORT TERM GOALS: Target date: 01/03/24  Pt will be independent with HEP to improve lumbar ROM, LE strength, and endurance to improve functional capacity.  Baseline: 12/06/23: provided HEP Goal status: INITIAL  LONG TERM GOALS: Target date: 01/31/24  Pt will improve FOTO to target score to demonstrate clinically significant improvement in functional mobility.  Baseline: 12/06/23: Deferred to next session Goal status: INITIAL  2.  Pt will improve 5xSTS by at least 2.3 seconds to improve LE strength for completing standing and walking ADL's.  Baseline: 12/06/23: 14.81 seconds Goal status: INITIAL  3.  Pt will improve 6 MWT by at least 145' to demonstrate clinically significant improvement in completing community walking distances.  Baseline: 12/06/23; Completed 2 MWT at 361'. Goal status: INITIAL  PLAN:  PT FREQUENCY: 1-2x/week  PT DURATION: 8 weeks  PLANNED INTERVENTIONS: 02835- PT Re-evaluation,  97110-Therapeutic exercises, 97530- Therapeutic activity, [id number]- Neuromuscular re-education, 97535- Self Care, 02859- Manual therapy, [phone]- Gait training, [id number]- Electrical stimulation (unattended), [phone]- Electrical stimulation (manual), Patient/Family education, Balance training, Stair training, Joint mobilization, Joint manipulation, Spinal manipulation, Spinal mobilization, Cryotherapy, and Moist heat.  PLAN FOR NEXT SESSION: FOTO, , thomas test, review HEP   Dorina HERO. Fairly IV, PT, DPT Physical Therapist- [place]  [facility]  12/06/2023, 7:26 PM

## 2023-12-08 ENCOUNTER — Ambulatory Visit: Payer: PPO

## 2023-12-08 DIAGNOSIS — M5459 Other low back pain: Secondary | ICD-10-CM | POA: Diagnosis not present

## 2023-12-08 DIAGNOSIS — R262 Difficulty in walking, not elsewhere classified: Secondary | ICD-10-CM

## 2023-12-08 DIAGNOSIS — M6281 Muscle weakness (generalized): Secondary | ICD-10-CM

## 2023-12-08 DIAGNOSIS — R293 Abnormal posture: Secondary | ICD-10-CM

## 2023-12-08 NOTE — Therapy (Signed)
OUTPATIENT PHYSICAL THERAPY THORACOLUMBAR TREATMENT   Patient Name: Christopher Huang MRN: [id number] DOB:[birth year], 73 y.o., male Today's Date: 12/09/2023  END OF SESSION:  PT End of Session - 12/09/23 0802     Visit Number 2    Number of Visits 17    Date for PT Re-Evaluation 01/31/24    PT Start Time 1602    PT Stop Time 1645    PT Time Calculation (min) 43 min    Activity Tolerance Patient tolerated treatment well    Behavior During Therapy [facility] for tasks assessed/performed              Past Medical History:  Diagnosis Date   Allergic rhinitis    Cancer of prostate (HCC) 02/2007   s/p surgery   Dementia (HCC)    History of kidney stones    H/O   Hyperlipidemia    Hypertension    Personal history of kidney stones    Past Surgical History:  Procedure Laterality Date   ANTERIOR LATERAL LUMBAR FUSION WITH PERCUTANEOUS SCREW 1 LEVEL N/A 01/29/2021   Procedure: L4-5 LATERAL INTERBODY FUSION, L4-5 POSTERIOR FUSION;  Surgeon: Venetia Night, MD;  Location: [facility] ORS;  Service: Neurosurgery;  Laterality: N/A;   basal skin cancers     CATARACT EXTRACTION, BILATERAL     2021   COLONOSCOPY WITH PROPOFOL N/A 11/09/2017   Procedure: COLONOSCOPY WITH PROPOFOL;  Surgeon: Midge Minium, MD;  Location: [facility] ENDOSCOPY;  Service: Endoscopy;  Laterality: N/A;   COLONOSCOPY WITH PROPOFOL N/A 12/01/2022   Procedure: COLONOSCOPY WITH PROPOFOL;  Surgeon: Midge Minium, MD;  Location: [facility] ENDOSCOPY;  Service: Endoscopy;  Laterality: N/A;   EYE SURGERY Bilateral    Cataract   HAND SURGERY     THUMB SURGERY   KNEE ARTHROSCOPY     prostate cancer removed     TONSILLECTOMY     TONSILLECTOMY     TOTAL KNEE ARTHROPLASTY Left 02/09/2020   Procedure: TOTAL KNEE ARTHROPLASTY - RNFA;  Surgeon: Kennedy Bucker, MD;  Location: [facility] ORS;  Service: Orthopedics;  Laterality: Left;   Patient Active Problem List   Diagnosis Date Noted   Failed back surgical syndrome 03/22/2023   Lumbar facet  arthropathy 12/07/2022   Spinal stenosis, lumbar region, without neurogenic claudication (L3/4) 12/07/2022   Lumbar degenerative disc disease 12/07/2022   Chronic knee pain after total replacement of left knee joint 12/07/2022   Polyp of ascending colon 12/01/2022   Heart failure with reduced ejection fraction (HCC) 10/25/2022   LBBB (left bundle branch block) 05/24/2022   Frontotemporal dementia (HCC) 03/27/2022   Vitamin D deficiency 09/26/2021   History of back surgery 03/07/2021   Knee joint replacement status, left 02/09/2020   Advanced care planning/counseling discussion 02/02/2019   Arthritis of hand 11/25/2018   History of colonic polyps    Allergic rhinitis    History of prostate cancer    Hyperlipidemia    Hypertension    Personal history of kidney stones     PCP: Aura Dials, T NP  REFERRING PROVIDER: Drake Leach, PA-C  REFERRING DIAG: Z98.1 (ICD-10-CM) - S/P lumbar fusionM47.816 (ICD-10-CM) - Lumbar spondylosisM48.061 (ICD-10-CM) - Spinal stenosis of lumbar region, unspecified whether neurogenic claudication presentM43.16 (ICD-10-CM) - Spondylolisthesis of lumbar regionM51.360 (ICD-10-CM) - Degeneration of intervertebral disc of lumbar region with discogenic back pain  Rationale for Evaluation and Treatment: Rehabilitation  THERAPY DIAG:  Other low back pain  Abnormal posture  Muscle weakness (generalized)  Difficulty in walking, not elsewhere classified  ONSET  DATE: Chronic. Has had it for many years.  SUBJECTIVE:                                                                                                                                                                                           SUBJECTIVE STATEMENT: Pt reports earlier 5/10 NPS in the morning and early afternoon. No pain currently. Tried HEP completion.  PERTINENT HISTORY:  Pt's spouse assisting in subjective. Has had prior lumbar surgery. Sounds like he has had adjacent lumbar changes  above his hardware and spouse states they (MD's) believe this is his source of pain. Spouse reports poor endurance for standing and walking. Pt and spouse state his walking tolerance is only 2-3 minutes. Severe LBP is what is limiting him from progressing, not his LE strength denying neurogenic claudication symptoms. Pain is central, and lower. Reports stabbing pain with bending over, standing, walking. Denies use of a SPC or RW to assist in balance/endurance. Worst pain described worst pain as a 5/10 NPS with aggravating activities. Best pain around 1/10 NPS. Does endorse stiffness and acheiness with weather changes and mornings. Reports forward leaning with walking. Does wear back brace regularly which he states helps his pain.   PAIN:  Are you having pain? Yes: NPRS scale: 1/10 NPS Pain location: Central, lower back Pain description: sharp, stiff, achey Aggravating factors: activity, standing, walking, bending Relieving factors: rest, sitting, back brace  PRECAUTIONS: None  RED FLAGS: Bowel or bladder incontinence: No and Cauda equina syndrome: No   WEIGHT BEARING RESTRICTIONS: No  FALLS:  Has patient fallen in last 6 months? No  LIVING ENVIRONMENT: Lives with: lives with their spouse Lives in: House/apartment Stairs: Yes: External: 3-4 steps; bilateral but cannot reach both Has following equipment at home: None  OCCUPATION: Retired   PLOF: Independent  PATIENT GOALS: To improve his pain and endurance.   NEXT MD VISIT: N/A  OBJECTIVE:  Note: Objective measures were completed at Evaluation unless otherwise noted.  DIAGNOSTIC FINDINGS:  Lumbar MRI dated 10/13/23:  FINDINGS: Segmentation: Transitional lumbosacral anatomy with a partially lumbarized S1 segment, as before. Utilizing this numbering, the patient has undergone previous PLIF at L4-5.   Alignment: The alignment is stable with a mild scoliosis, a grade 1 retrolisthesis at L2-3 and a grade 1 anterolisthesis at L4-5  and L5-S1.   Vertebrae: No worrisome osseous lesion, acute fracture or pars defect. Stable L3 hemangioma. Stable postsurgical changes from L4-5 PLIF.   Conus medullaris: Extends to the T12-L1 level and appears normal.   Paraspinal and other soft tissues: No significant paraspinal findings.   Disc levels:   Sagittal images demonstrate  no significant disc space findings within the visualized lower thoracic spine.   L1-2: Only imaged in the sagittal plane. Preserved disc height with mild bilateral facet hypertrophy. No significant spinal stenosis or nerve root encroachment.   L2-3: Stable chronic loss of disc height with annular disc bulging and endplate osteophytes asymmetric to the right. Moderate facet and ligamentous hypertrophy. Unchanged mild multifactorial spinal stenosis with asymmetric narrowing of the right lateral recess, moderate right and mild left foraminal narrowing.   L3-4: Mild loss of disc height with annular disc bulging and endplate osteophytes asymmetric to the right. There advanced facet and ligamentous hypertrophy contributing to stable moderate to severe multifactorial spinal stenosis. Mild lateral recess and foraminal narrowing, right greater than left, is unchanged.   L4-5: Previous posterior decompression and PLIF. The spinal canal and neural foramina are widely patent.   L5-S1: Chronic mild loss of disc height with annular disc bulging and endplate osteophytes asymmetric to the left. Mild right and moderate left foraminal narrowing. The spinal canal, lateral recesses and right foramen remain widely patent. There is chronic moderate left foraminal narrowing.   S1-2: Transitional disc space level appears stable, without spinal stenosis or nerve root encroachment.   IMPRESSION: 1. No acute findings or significant changes compared with previous study from 11 months prior. 2. Stable mild multifactorial spinal stenosis at L2-3 with asymmetric  narrowing of the right lateral recess, moderate right and mild left foraminal narrowing. 3. Stable moderate to severe multifactorial spinal stenosis at L3-4 with mild lateral recess and foraminal narrowing, right greater than left. 4. Stable postsurgical changes at L4-5 without residual or recurrent spinal stenosis. 5. Stable moderate chronic left foraminal narrowing at L5-S1.     Electronically Signed   By: Carey Bullocks M.D.   On: 10/31/2023 17:23  PATIENT SURVEYS:  FOTO 49/58  COGNITION: Overall cognitive status: History of cognitive impairments - at baseline     SENSATION: WFL  MUSCLE LENGTH: Hamstrings: Right 60 deg; Left 60 deg Thomas test: Positive specifically for rectus femoris tightness bilaterally  POSTURE: decreased lumbar lordosis, increased thoracic kyphosis, and hinging at hip flexors in standing  PALPATION: TTP at L1-L3 at spinous process and L sided paraspinals  LUMBAR ROM:   AROM eval  Flexion 75%  Extension 25%*  Right lateral flexion 50%  Left lateral flexion 50%*  Right rotation 100%  Left rotation 100%   (Blank rows = not tested)  LOWER EXTREMITY ROM:     Active  Right eval Left eval  Hip flexion    Hip extension    Hip abduction    Hip adduction    Hip internal rotation    Hip external rotation    Knee flexion    Knee extension    Ankle dorsiflexion    Ankle plantarflexion    Ankle inversion    Ankle eversion     (Blank rows = not tested)  LOWER EXTREMITY MMT:    MMT Right eval Left eval  Hip flexion 5 5  Hip extension 3 2  Hip abduction 3 3  Hip adduction    Hip internal rotation 4 4  Hip external rotation 5 5  Knee flexion 5 5  Knee extension 5 5  Ankle dorsiflexion 5 5  Ankle plantarflexion 5 4  Ankle inversion    Ankle eversion     (Blank rows = not tested)  LUMBAR SPECIAL TESTS:  Straight leg raise test: Negative, Slump test: Negative, and FABER test: Negative  FADDIR: Negative  FUNCTIONAL TESTS:  5  times sit to stand: 14.81 sec 2 minute walk test: 360'  GAIT: Distance walked: 300' Assistive device utilized: None Level of assistance: Complete Independence Comments: Crouched gait, decreased R/L foot clearance  TREATMENT DATE: 12/08/23  There.ex: FOTO: 49/58 6 MWT: 952'.   Thomas Test: R/L positive for rec fem tightness.   Reviewed HEP: performed reps/sets as listed below. Required mod multimodal cuing and re-education due to baseline dementia.  Access Code: [email or web address] URL: [email or web address] Date: 12/06/2023 Prepared by: Ronnie Derby  Exercises - Sit to Stand Without Arm Support  - 1 x daily - 3-4 x weekly - 3 sets - 12 reps - Standing Hip Abduction with Counter Support  - 1 x daily - 3-4 x weekly - 3 sets - 12 reps - Seated Hamstring Stretch  - 1 x daily - 7 x weekly - 1 sets - 2 reps - 30 hold  PATIENT EDUCATION:  Education details: HEP, POC Person educated: Patient and Spouse Education method: Explanation, Demonstration, and Handouts Education comprehension: verbalized understanding and needs further education  HOME EXERCISE PROGRAM: Access Code: [email or web address] URL: [email or web address] Date: 12/06/2023 Prepared by: Ronnie Derby  Exercises - Sit to Stand Without Arm Support  - 1 x daily - 3-4 x weekly - 3 sets - 12 reps - Standing Hip Abduction with Counter Support  - 1 x daily - 3-4 x weekly - 3 sets - 12 reps - Seated Hamstring Stretch  - 1 x daily - 7 x weekly - 1 sets - 2 reps - 30 hold  ASSESSMENT:  CLINICAL IMPRESSION: Pt and spouse arriving for first treatment session. Continuing assessment with completion of FOTO, 6 MWT, and thomas test. Pt remains a falls risk due to lumbar impairments completing well below age matched norms for community dwelling older adults with mild increase in LBP post test. Pt also with notable hip flexor test evident via Maisie Fus test. This is likely due to flexed posture with gait in the setting of known  lumbar stenosis. Reviewed HEP with mod multimodal cuing needed due to pt's baseline dementia with fair carryover. Educated spouse mainly on correct form/technique with alternative techniques to assist pt in improved carryover. These impairments are limiting pt's ability to recreationally fish, perform community walking distances, and bending over ADL's. Pt will benefit from skilled PT services to address these impairments and maximize independence with functional mobility.  OBJECTIVE IMPAIRMENTS: Abnormal gait, decreased activity tolerance, decreased balance, decreased endurance, decreased mobility, difficulty walking, decreased ROM, decreased strength, impaired flexibility, postural dysfunction, and pain.   ACTIVITY LIMITATIONS: carrying, lifting, bending, standing, squatting, stairs, and locomotion level  PARTICIPATION LIMITATIONS: community activity  PERSONAL FACTORS: Age, Past/current experiences, Time since onset of injury/illness/exacerbation, and 3+ comorbidities: Dementia, hx of prostate cancer, HLD, HTN   are also affecting patient's functional outcome.   REHAB POTENTIAL: Fair Chronicity of symptoms  CLINICAL DECISION MAKING: Evolving/moderate complexity  EVALUATION COMPLEXITY: Moderate   GOALS: Goals reviewed with patient? No  SHORT TERM GOALS: Target date: 01/03/24  Pt will be independent with HEP to improve lumbar ROM, LE strength, and endurance to improve functional capacity.  Baseline: 12/06/23: provided HEP Goal status: INITIAL  LONG TERM GOALS: Target date: 01/31/24  Pt will improve FOTO to target score to demonstrate clinically significant improvement in functional mobility.  Baseline: 12/06/23: Deferred to next session; 12/08/23: 49/58 Goal status: INITIAL  2.  Pt will improve 5xSTS by at least 2.3 seconds to improve LE strength for completing standing and walking ADL's.  Baseline: 12/06/23: 14.81 seconds Goal status: INITIAL  3.  Pt will improve 6 MWT by at least 145'  to demonstrate clinically significant improvement in completing community walking distances.  Baseline: 12/06/23; Completed 2 MWT at 361'.; 12/08/23: 952'. Goal status: INITIAL  PLAN:  PT FREQUENCY: 1-2x/week  PT DURATION: 8 weeks  PLANNED INTERVENTIONS: 97164- PT Re-evaluation, 97110-Therapeutic exercises, 97530- Therapeutic activity, 97112- Neuromuscular re-education, 97535- Self Care, 16109- Manual therapy, [phone]- Gait training, 97014- Electrical stimulation (unattended), [phone]- Electrical stimulation (manual), Patient/Family education, Balance training, Stair training, Joint mobilization, Joint manipulation, Spinal manipulation, Spinal mobilization, Cryotherapy, and Moist heat.  PLAN FOR NEXT SESSION: Prone rec fem stretch for HEP, hip and core strengthening. Hip flexor strengthening for foot clearance.    Delphia Grates. Fairly IV, PT, DPT Physical Therapist- [place]  [facility]  12/09/2023, 8:08 AM

## 2023-12-10 ENCOUNTER — Ambulatory Visit: Payer: PPO

## 2023-12-15 ENCOUNTER — Ambulatory Visit: Payer: PPO

## 2023-12-20 ENCOUNTER — Ambulatory Visit: Payer: PPO

## 2023-12-20 DIAGNOSIS — R293 Abnormal posture: Secondary | ICD-10-CM

## 2023-12-20 DIAGNOSIS — M5459 Other low back pain: Secondary | ICD-10-CM | POA: Diagnosis not present

## 2023-12-20 DIAGNOSIS — R262 Difficulty in walking, not elsewhere classified: Secondary | ICD-10-CM

## 2023-12-20 DIAGNOSIS — M6281 Muscle weakness (generalized): Secondary | ICD-10-CM

## 2023-12-20 NOTE — Therapy (Signed)
OUTPATIENT PHYSICAL THERAPY THORACOLUMBAR TREATMENT   Patient Name: Christopher Huang MRN: [id number] DOB:November 10, 1951, 73 y.o., male Today's Date: 12/20/2023  END OF SESSION:  PT End of Session - 12/20/23 1119     Visit Number 3    Number of Visits 17    Date for PT Re-Evaluation 01/31/24    PT Start Time 1116    PT Stop Time 1200    PT Time Calculation (min) 44 min    Activity Tolerance Patient tolerated treatment well    Behavior During Therapy [facility] for tasks assessed/performed              Past Medical History:  Diagnosis Date   Allergic rhinitis    Cancer of prostate (HCC) 02/2007   s/p surgery   Dementia (HCC)    History of kidney stones    H/O   Hyperlipidemia    Hypertension    Personal history of kidney stones    Past Surgical History:  Procedure Laterality Date   ANTERIOR LATERAL LUMBAR FUSION WITH PERCUTANEOUS SCREW 1 LEVEL N/A 01/29/2021   Procedure: L4-5 LATERAL INTERBODY FUSION, L4-5 POSTERIOR FUSION;  Surgeon: Venetia Night, MD;  Location: [facility] ORS;  Service: Neurosurgery;  Laterality: N/A;   basal skin cancers     CATARACT EXTRACTION, BILATERAL     2021   COLONOSCOPY WITH PROPOFOL N/A 11/09/2017   Procedure: COLONOSCOPY WITH PROPOFOL;  Surgeon: Midge Minium, MD;  Location: [facility] ENDOSCOPY;  Service: Endoscopy;  Laterality: N/A;   COLONOSCOPY WITH PROPOFOL N/A 12/01/2022   Procedure: COLONOSCOPY WITH PROPOFOL;  Surgeon: Midge Minium, MD;  Location: [facility] ENDOSCOPY;  Service: Endoscopy;  Laterality: N/A;   EYE SURGERY Bilateral    Cataract   HAND SURGERY     THUMB SURGERY   KNEE ARTHROSCOPY     prostate cancer removed     TONSILLECTOMY     TONSILLECTOMY     TOTAL KNEE ARTHROPLASTY Left 02/09/2020   Procedure: TOTAL KNEE ARTHROPLASTY - RNFA;  Surgeon: Kennedy Bucker, MD;  Location: [facility] ORS;  Service: Orthopedics;  Laterality: Left;   Patient Active Problem List   Diagnosis Date Noted   Failed back surgical syndrome 03/22/2023   Lumbar facet  arthropathy 12/07/2022   Spinal stenosis, lumbar region, without neurogenic claudication (L3/4) 12/07/2022   Lumbar degenerative disc disease 12/07/2022   Chronic knee pain after total replacement of left knee joint 12/07/2022   Polyp of ascending colon 12/01/2022   Heart failure with reduced ejection fraction (HCC) 10/25/2022   LBBB (left bundle branch block) 05/24/2022   Frontotemporal dementia (HCC) 03/27/2022   Vitamin D deficiency 09/26/2021   History of back surgery 03/07/2021   Knee joint replacement status, left 02/09/2020   Advanced care planning/counseling discussion 02/02/2019   Arthritis of hand 11/25/2018   History of colonic polyps    Allergic rhinitis    History of prostate cancer    Hyperlipidemia    Hypertension    Personal history of kidney stones     PCP: Aura Dials, T NP  REFERRING PROVIDER: Drake Leach, PA-C  REFERRING DIAG: Z98.1 (ICD-10-CM) - S/P lumbar fusionM47.816 (ICD-10-CM) - Lumbar spondylosisM48.061 (ICD-10-CM) - Spinal stenosis of lumbar region, unspecified whether neurogenic claudication presentM43.16 (ICD-10-CM) - Spondylolisthesis of lumbar regionM51.360 (ICD-10-CM) - Degeneration of intervertebral disc of lumbar region with discogenic back pain  Rationale for Evaluation and Treatment: Rehabilitation  THERAPY DIAG:  Other low back pain  Abnormal posture  Muscle weakness (generalized)  Difficulty in walking, not elsewhere classified  ONSET  DATE: Chronic. Has had it for many years.  SUBJECTIVE:                                                                                                                                                                                           SUBJECTIVE STATEMENT: Pt reports no pain currently. Has felt a little improvement since beginning PT. Spouse reports improved form/technique with HEP. Worst pain been about a 2-3/10 NPS.   PERTINENT HISTORY:  Pt's spouse assisting in subjective. Has had prior  lumbar surgery. Sounds like he has had adjacent lumbar changes above his hardware and spouse states they (MD's) believe this is his source of pain. Spouse reports poor endurance for standing and walking. Pt and spouse state his walking tolerance is only 2-3 minutes. Severe LBP is what is limiting him from progressing, not his LE strength denying neurogenic claudication symptoms. Pain is central, and lower. Reports stabbing pain with bending over, standing, walking. Denies use of a SPC or RW to assist in balance/endurance. Worst pain described worst pain as a 5/10 NPS with aggravating activities. Best pain around 1/10 NPS. Does endorse stiffness and acheiness with weather changes and mornings. Reports forward leaning with walking. Does wear back brace regularly which he states helps his pain.   PAIN:  Are you having pain? Yes: NPRS scale: 0/10 NPS Pain location: Central, lower back Pain description: sharp, stiff, achey Aggravating factors: activity, standing, walking, bending Relieving factors: rest, sitting, back brace  PRECAUTIONS: None  RED FLAGS: Bowel or bladder incontinence: No and Cauda equina syndrome: No   WEIGHT BEARING RESTRICTIONS: No  FALLS:  Has patient fallen in last 6 months? No  LIVING ENVIRONMENT: Lives with: lives with their spouse Lives in: House/apartment Stairs: Yes: External: 3-4 steps; bilateral but cannot reach both Has following equipment at home: None  OCCUPATION: Retired   PLOF: Independent  PATIENT GOALS: To improve his pain and endurance.   NEXT MD VISIT: N/A  OBJECTIVE:  Note: Objective measures were completed at Evaluation unless otherwise noted.  DIAGNOSTIC FINDINGS:  Lumbar MRI dated 10/13/23:  FINDINGS: Segmentation: Transitional lumbosacral anatomy with a partially lumbarized S1 segment, as before. Utilizing this numbering, the patient has undergone previous PLIF at L4-5.   Alignment: The alignment is stable with a mild scoliosis, a grade  1 retrolisthesis at L2-3 and a grade 1 anterolisthesis at L4-5 and L5-S1.   Vertebrae: No worrisome osseous lesion, acute fracture or pars defect. Stable L3 hemangioma. Stable postsurgical changes from L4-5 PLIF.   Conus medullaris: Extends to the T12-L1 level and appears normal.   Paraspinal and other soft tissues: No significant paraspinal  findings.   Disc levels:   Sagittal images demonstrate no significant disc space findings within the visualized lower thoracic spine.   L1-2: Only imaged in the sagittal plane. Preserved disc height with mild bilateral facet hypertrophy. No significant spinal stenosis or nerve root encroachment.   L2-3: Stable chronic loss of disc height with annular disc bulging and endplate osteophytes asymmetric to the right. Moderate facet and ligamentous hypertrophy. Unchanged mild multifactorial spinal stenosis with asymmetric narrowing of the right lateral recess, moderate right and mild left foraminal narrowing.   L3-4: Mild loss of disc height with annular disc bulging and endplate osteophytes asymmetric to the right. There advanced facet and ligamentous hypertrophy contributing to stable moderate to severe multifactorial spinal stenosis. Mild lateral recess and foraminal narrowing, right greater than left, is unchanged.   L4-5: Previous posterior decompression and PLIF. The spinal canal and neural foramina are widely patent.   L5-S1: Chronic mild loss of disc height with annular disc bulging and endplate osteophytes asymmetric to the left. Mild right and moderate left foraminal narrowing. The spinal canal, lateral recesses and right foramen remain widely patent. There is chronic moderate left foraminal narrowing.   S1-2: Transitional disc space level appears stable, without spinal stenosis or nerve root encroachment.   IMPRESSION: 1. No acute findings or significant changes compared with previous study from 11 months prior. 2. Stable  mild multifactorial spinal stenosis at L2-3 with asymmetric narrowing of the right lateral recess, moderate right and mild left foraminal narrowing. 3. Stable moderate to severe multifactorial spinal stenosis at L3-4 with mild lateral recess and foraminal narrowing, right greater than left. 4. Stable postsurgical changes at L4-5 without residual or recurrent spinal stenosis. 5. Stable moderate chronic left foraminal narrowing at L5-S1.     Electronically Signed   By: Carey Bullocks M.D.   On: 10/31/2023 17:23  PATIENT SURVEYS:  FOTO 49/58  COGNITION: Overall cognitive status: History of cognitive impairments - at baseline     SENSATION: WFL  MUSCLE LENGTH: Hamstrings: Right 60 deg; Left 60 deg Thomas test: Positive specifically for rectus femoris tightness bilaterally  POSTURE: decreased lumbar lordosis, increased thoracic kyphosis, and hinging at hip flexors in standing  PALPATION: TTP at L1-L3 at spinous process and L sided paraspinals  LUMBAR ROM:   AROM eval  Flexion 75%  Extension 25%*  Right lateral flexion 50%  Left lateral flexion 50%*  Right rotation 100%  Left rotation 100%   (Blank rows = not tested)  LOWER EXTREMITY ROM:     Active  Right eval Left eval  Hip flexion    Hip extension    Hip abduction    Hip adduction    Hip internal rotation    Hip external rotation    Knee flexion    Knee extension    Ankle dorsiflexion    Ankle plantarflexion    Ankle inversion    Ankle eversion     (Blank rows = not tested)  LOWER EXTREMITY MMT:    MMT Right eval Left eval  Hip flexion 5 5  Hip extension 3 2  Hip abduction 3 3  Hip adduction    Hip internal rotation 4 4  Hip external rotation 5 5  Knee flexion 5 5  Knee extension 5 5  Ankle dorsiflexion 5 5  Ankle plantarflexion 5 4  Ankle inversion    Ankle eversion     (Blank rows = not tested)  LUMBAR SPECIAL TESTS:  Straight leg raise test: Negative, Slump  test: Negative, and FABER  test: Negative  FADDIR: Negative   FUNCTIONAL TESTS:  5 times sit to stand: 14.81 sec 2 minute walk test: 360'  GAIT: Distance walked: 300' Assistive device utilized: None Level of assistance: Complete Independence Comments: Crouched gait, decreased R/L foot clearance  TREATMENT DATE: 12/20/23  There.ex:  STS: 2x12, 3 KG med ball. Use of med ball for proprioceptive cue for anterior trunk lean. Excellent carryover. Min multimodal cuing for full hip extension.   Seated hamstring stretch: 3x30 sec/LE   Prone hip extension, quad stretch with towel under knee: 2x30 seconds, PT overpressure   Hook lying glut bridge hip extension: 2x8 reps  Prone dead bugs isometric holds: x5, 15 second holds  Gait post session 100' with VC's for upright posture. SBA.   PATIENT EDUCATION:  Education details: HEP, POC Person educated: Patient and Spouse Education method: Explanation, Demonstration, and Handouts Education comprehension: verbalized understanding and needs further education  HOME EXERCISE PROGRAM: Access Code: [email or web address] URL: [email or web address] Date: 12/20/2023 Prepared by: Ronnie Derby  Exercises - Isometric Dead Bug  - 1 x daily - 3-4 x weekly - 1 sets - 5 reps - 15 hold - Sit to Stand Without Arm Support  - 1 x daily - 3-4 x weekly - 3 sets - 12 reps - Standing Hip Abduction with Counter Support  - 1 x daily - 3-4 x weekly - 3 sets - 12 reps - Prone Quad Stretch with Towel Roll and Strap  - 1 x daily - 7 x weekly - 1 sets - 3 reps - 30 hold - Seated Hamstring Stretch  - 1 x daily - 7 x weekly - 1 sets - 2 reps - 30 hold   Access Code: [email or web address] URL: [email or web address] Date: 12/06/2023 Prepared by: Ronnie Derby  Exercises - Sit to Stand Without Arm Support  - 1 x daily - 3-4 x weekly - 3 sets - 12 reps - Standing Hip Abduction with Counter Support  - 1 x daily - 3-4 x weekly - 3 sets - 12 reps - Seated Hamstring Stretch  - 1 x daily - 7  x weekly - 1 sets - 2 reps - 30 hold  ASSESSMENT:  CLINICAL IMPRESSION: Continuing PT POC with focus on hip strength, LE flexibility, and addition of core stability exercises. Pt demonstrates excellent carryover with HEP related exercises indicative of completion in home setting. Working on hip flexor flexibility and glut activation to assist in improved, upright posture with addition of core activation for long term carryover and assistance with LBP. Updated HEP to work on core stability and hip flexor flexibility. Pt will benefit from skilled PT services to address these impairments and maximize independence with functional mobility.  OBJECTIVE IMPAIRMENTS: Abnormal gait, decreased activity tolerance, decreased balance, decreased endurance, decreased mobility, difficulty walking, decreased ROM, decreased strength, impaired flexibility, postural dysfunction, and pain.   ACTIVITY LIMITATIONS: carrying, lifting, bending, standing, squatting, stairs, and locomotion level  PARTICIPATION LIMITATIONS: community activity  PERSONAL FACTORS: Age, Past/current experiences, Time since onset of injury/illness/exacerbation, and 3+ comorbidities: Dementia, hx of prostate cancer, HLD, HTN   are also affecting patient's functional outcome.   REHAB POTENTIAL: Fair Chronicity of symptoms  CLINICAL DECISION MAKING: Evolving/moderate complexity  EVALUATION COMPLEXITY: Moderate   GOALS: Goals reviewed with patient? No  SHORT TERM GOALS: Target date: 01/03/24  Pt will be independent with HEP to improve lumbar ROM, LE strength, and endurance to improve functional capacity.  Baseline: 12/06/23: provided HEP Goal status: INITIAL  LONG TERM GOALS: Target date: 01/31/24  Pt will improve FOTO to target score to demonstrate clinically significant improvement in functional mobility.  Baseline: 12/06/23: Deferred to next session; 12/08/23: 49/58 Goal status: INITIAL  2.  Pt will improve 5xSTS by at least 2.3  seconds to improve LE strength for completing standing and walking ADL's.  Baseline: 12/06/23: 14.81 seconds Goal status: INITIAL  3.  Pt will improve 6 MWT by at least 145' to demonstrate clinically significant improvement in completing community walking distances.  Baseline: 12/06/23; Completed 2 MWT at 361'.; 12/08/23: 952'. Goal status: INITIAL  PLAN:  PT FREQUENCY: 1-2x/week  PT DURATION: 8 weeks  PLANNED INTERVENTIONS: 97164- PT Re-evaluation, 97110-Therapeutic exercises, 97530- Therapeutic activity, 97112- Neuromuscular re-education, 97535- Self Care, 95621- Manual therapy, [phone]- Gait training, 97014- Electrical stimulation (unattended), [phone]- Electrical stimulation (manual), Patient/Family education, Balance training, Stair training, Joint mobilization, Joint manipulation, Spinal manipulation, Spinal mobilization, Cryotherapy, and Moist heat.  PLAN FOR NEXT SESSION: Prone rec fem stretch, hip and core strengthening. Hip flexor strengthening for foot clearance.    Delphia Grates. Fairly IV, PT, DPT Physical Therapist- [place]  [facility]  12/20/2023, 12:23 PM

## 2023-12-22 ENCOUNTER — Ambulatory Visit: Payer: PPO

## 2023-12-22 DIAGNOSIS — R293 Abnormal posture: Secondary | ICD-10-CM

## 2023-12-22 DIAGNOSIS — M5459 Other low back pain: Secondary | ICD-10-CM | POA: Diagnosis not present

## 2023-12-22 DIAGNOSIS — R262 Difficulty in walking, not elsewhere classified: Secondary | ICD-10-CM

## 2023-12-22 DIAGNOSIS — M6281 Muscle weakness (generalized): Secondary | ICD-10-CM

## 2023-12-22 NOTE — Therapy (Signed)
OUTPATIENT PHYSICAL THERAPY THORACOLUMBAR TREATMENT   Patient Name: Christopher Huang MRN: [id number] DOB:1951/09/10, 73 y.o., male Today's Date: 12/22/2023  END OF SESSION:  PT End of Session - 12/22/23 1144     Visit Number 4    Number of Visits 17    Date for PT Re-Evaluation 01/31/24    PT Start Time 1116    PT Stop Time 1200    PT Time Calculation (min) 44 min    Activity Tolerance Patient tolerated treatment well    Behavior During Therapy [facility] for tasks assessed/performed              Past Medical History:  Diagnosis Date   Allergic rhinitis    Cancer of prostate (HCC) 02/2007   s/p surgery   Dementia (HCC)    History of kidney stones    H/O   Hyperlipidemia    Hypertension    Personal history of kidney stones    Past Surgical History:  Procedure Laterality Date   ANTERIOR LATERAL LUMBAR FUSION WITH PERCUTANEOUS SCREW 1 LEVEL N/A 01/29/2021   Procedure: L4-5 LATERAL INTERBODY FUSION, L4-5 POSTERIOR FUSION;  Surgeon: Venetia Night, MD;  Location: [facility] ORS;  Service: Neurosurgery;  Laterality: N/A;   basal skin cancers     CATARACT EXTRACTION, BILATERAL     2021   COLONOSCOPY WITH PROPOFOL N/A 11/09/2017   Procedure: COLONOSCOPY WITH PROPOFOL;  Surgeon: Midge Minium, MD;  Location: [facility] ENDOSCOPY;  Service: Endoscopy;  Laterality: N/A;   COLONOSCOPY WITH PROPOFOL N/A 12/01/2022   Procedure: COLONOSCOPY WITH PROPOFOL;  Surgeon: Midge Minium, MD;  Location: [facility] ENDOSCOPY;  Service: Endoscopy;  Laterality: N/A;   EYE SURGERY Bilateral    Cataract   HAND SURGERY     THUMB SURGERY   KNEE ARTHROSCOPY     prostate cancer removed     TONSILLECTOMY     TONSILLECTOMY     TOTAL KNEE ARTHROPLASTY Left 02/09/2020   Procedure: TOTAL KNEE ARTHROPLASTY - RNFA;  Surgeon: Kennedy Bucker, MD;  Location: [facility] ORS;  Service: Orthopedics;  Laterality: Left;   Patient Active Problem List   Diagnosis Date Noted   Failed back surgical syndrome 03/22/2023   Lumbar facet  arthropathy 12/07/2022   Spinal stenosis, lumbar region, without neurogenic claudication (L3/4) 12/07/2022   Lumbar degenerative disc disease 12/07/2022   Chronic knee pain after total replacement of left knee joint 12/07/2022   Polyp of ascending colon 12/01/2022   Heart failure with reduced ejection fraction (HCC) 10/25/2022   LBBB (left bundle branch block) 05/24/2022   Frontotemporal dementia (HCC) 03/27/2022   Vitamin D deficiency 09/26/2021   History of back surgery 03/07/2021   Knee joint replacement status, left 02/09/2020   Advanced care planning/counseling discussion 02/02/2019   Arthritis of hand 11/25/2018   History of colonic polyps    Allergic rhinitis    History of prostate cancer    Hyperlipidemia    Hypertension    Personal history of kidney stones     PCP: Aura Dials, T NP  REFERRING PROVIDER: Drake Leach, PA-C  REFERRING DIAG: Z98.1 (ICD-10-CM) - S/P lumbar fusionM47.816 (ICD-10-CM) - Lumbar spondylosisM48.061 (ICD-10-CM) - Spinal stenosis of lumbar region, unspecified whether neurogenic claudication presentM43.16 (ICD-10-CM) - Spondylolisthesis of lumbar regionM51.360 (ICD-10-CM) - Degeneration of intervertebral disc of lumbar region with discogenic back pain  Rationale for Evaluation and Treatment: Rehabilitation  THERAPY DIAG:  Other low back pain  Abnormal posture  Muscle weakness (generalized)  Difficulty in walking, not elsewhere classified  ONSET  DATE: Chronic. Has had it for many years.  SUBJECTIVE:                                                                                                                                                                                           SUBJECTIVE STATEMENT: Pt reports having wisdom tooth removal yesterday. No pain currently.   PERTINENT HISTORY:  Pt's spouse assisting in subjective. Has had prior lumbar surgery. Sounds like he has had adjacent lumbar changes above his hardware and spouse  states they (MD's) believe this is his source of pain. Spouse reports poor endurance for standing and walking. Pt and spouse state his walking tolerance is only 2-3 minutes. Severe LBP is what is limiting him from progressing, not his LE strength denying neurogenic claudication symptoms. Pain is central, and lower. Reports stabbing pain with bending over, standing, walking. Denies use of a SPC or RW to assist in balance/endurance. Worst pain described worst pain as a 5/10 NPS with aggravating activities. Best pain around 1/10 NPS. Does endorse stiffness and acheiness with weather changes and mornings. Reports forward leaning with walking. Does wear back brace regularly which he states helps his pain.   PAIN:  Are you having pain? Yes: NPRS scale: 0/10 NPS Pain location: Central, lower back Pain description: sharp, stiff, achey Aggravating factors: activity, standing, walking, bending Relieving factors: rest, sitting, back brace  PRECAUTIONS: None  RED FLAGS: Bowel or bladder incontinence: No and Cauda equina syndrome: No   WEIGHT BEARING RESTRICTIONS: No  FALLS:  Has patient fallen in last 6 months? No  LIVING ENVIRONMENT: Lives with: lives with their spouse Lives in: House/apartment Stairs: Yes: External: 3-4 steps; bilateral but cannot reach both Has following equipment at home: None  OCCUPATION: Retired   PLOF: Independent  PATIENT GOALS: To improve his pain and endurance.   NEXT MD VISIT: N/A  OBJECTIVE:  Note: Objective measures were completed at Evaluation unless otherwise noted.  DIAGNOSTIC FINDINGS:  Lumbar MRI dated 10/13/23:  FINDINGS: Segmentation: Transitional lumbosacral anatomy with a partially lumbarized S1 segment, as before. Utilizing this numbering, the patient has undergone previous PLIF at L4-5.   Alignment: The alignment is stable with a mild scoliosis, a grade 1 retrolisthesis at L2-3 and a grade 1 anterolisthesis at L4-5 and L5-S1.   Vertebrae: No  worrisome osseous lesion, acute fracture or pars defect. Stable L3 hemangioma. Stable postsurgical changes from L4-5 PLIF.   Conus medullaris: Extends to the T12-L1 level and appears normal.   Paraspinal and other soft tissues: No significant paraspinal findings.   Disc levels:   Sagittal images demonstrate no significant disc space findings within  the visualized lower thoracic spine.   L1-2: Only imaged in the sagittal plane. Preserved disc height with mild bilateral facet hypertrophy. No significant spinal stenosis or nerve root encroachment.   L2-3: Stable chronic loss of disc height with annular disc bulging and endplate osteophytes asymmetric to the right. Moderate facet and ligamentous hypertrophy. Unchanged mild multifactorial spinal stenosis with asymmetric narrowing of the right lateral recess, moderate right and mild left foraminal narrowing.   L3-4: Mild loss of disc height with annular disc bulging and endplate osteophytes asymmetric to the right. There advanced facet and ligamentous hypertrophy contributing to stable moderate to severe multifactorial spinal stenosis. Mild lateral recess and foraminal narrowing, right greater than left, is unchanged.   L4-5: Previous posterior decompression and PLIF. The spinal canal and neural foramina are widely patent.   L5-S1: Chronic mild loss of disc height with annular disc bulging and endplate osteophytes asymmetric to the left. Mild right and moderate left foraminal narrowing. The spinal canal, lateral recesses and right foramen remain widely patent. There is chronic moderate left foraminal narrowing.   S1-2: Transitional disc space level appears stable, without spinal stenosis or nerve root encroachment.   IMPRESSION: 1. No acute findings or significant changes compared with previous study from 11 months prior. 2. Stable mild multifactorial spinal stenosis at L2-3 with asymmetric narrowing of the right lateral  recess, moderate right and mild left foraminal narrowing. 3. Stable moderate to severe multifactorial spinal stenosis at L3-4 with mild lateral recess and foraminal narrowing, right greater than left. 4. Stable postsurgical changes at L4-5 without residual or recurrent spinal stenosis. 5. Stable moderate chronic left foraminal narrowing at L5-S1.     Electronically Signed   By: Carey Bullocks M.D.   On: 10/31/2023 17:23  PATIENT SURVEYS:  FOTO 49/58  COGNITION: Overall cognitive status: History of cognitive impairments - at baseline     SENSATION: WFL  MUSCLE LENGTH: Hamstrings: Right 60 deg; Left 60 deg Thomas test: Positive specifically for rectus femoris tightness bilaterally  POSTURE: decreased lumbar lordosis, increased thoracic kyphosis, and hinging at hip flexors in standing  PALPATION: TTP at L1-L3 at spinous process and L sided paraspinals  LUMBAR ROM:   AROM eval  Flexion 75%  Extension 25%*  Right lateral flexion 50%  Left lateral flexion 50%*  Right rotation 100%  Left rotation 100%   (Blank rows = not tested)  LOWER EXTREMITY ROM:     Active  Right eval Left eval  Hip flexion    Hip extension    Hip abduction    Hip adduction    Hip internal rotation    Hip external rotation    Knee flexion    Knee extension    Ankle dorsiflexion    Ankle plantarflexion    Ankle inversion    Ankle eversion     (Blank rows = not tested)  LOWER EXTREMITY MMT:    MMT Right eval Left eval  Hip flexion 5 5  Hip extension 3 2  Hip abduction 3 3  Hip adduction    Hip internal rotation 4 4  Hip external rotation 5 5  Knee flexion 5 5  Knee extension 5 5  Ankle dorsiflexion 5 5  Ankle plantarflexion 5 4  Ankle inversion    Ankle eversion     (Blank rows = not tested)  LUMBAR SPECIAL TESTS:  Straight leg raise test: Negative, Slump test: Negative, and FABER test: Negative  FADDIR: Negative   FUNCTIONAL TESTS:  5 times  sit to stand: 14.81 sec 2  minute walk test: 360'  GAIT: Distance walked: 300' Assistive device utilized: None Level of assistance: Complete Independence Comments: Crouched gait, decreased R/L foot clearance  TREATMENT DATE: 12/20/23  There.ex:  Nu-Step L5 for 5 min. BUE and LE use for lumbar warm up. Not billed.  STS: 2x12, 3 KG med ball. Use of med ball for proprioceptive cue for anterior trunk lean. Excellent carryover. Min multimodal cuing for full hip extension.   Bridge: 1x8. 2x8 with RTB at distal femurs.   Dead bugs: 3x10 sec holds. 5x20 sec holds. Min multimodal cuing for form/technique.   Alternating 6" step ups for foot clearance and LE strength: x8/LE. Light BUE support   PATIENT EDUCATION:  Education details: HEP, POC Person educated: Patient and Spouse Education method: Explanation, Demonstration, and Handouts Education comprehension: verbalized understanding and needs further education  HOME EXERCISE PROGRAM: Access Code: [email or web address] URL: [email or web address] Date: 12/20/2023 Prepared by: Ronnie Derby  Exercises - Isometric Dead Bug  - 1 x daily - 3-4 x weekly - 1 sets - 5 reps - 15 hold - Sit to Stand Without Arm Support  - 1 x daily - 3-4 x weekly - 3 sets - 12 reps - Standing Hip Abduction with Counter Support  - 1 x daily - 3-4 x weekly - 3 sets - 12 reps - Prone Quad Stretch with Towel Roll and Strap  - 1 x daily - 7 x weekly - 1 sets - 3 reps - 30 hold - Seated Hamstring Stretch  - 1 x daily - 7 x weekly - 1 sets - 2 reps - 30 hold   Access Code: [email or web address] URL: [email or web address] Date: 12/06/2023 Prepared by: Ronnie Derby  Exercises - Sit to Stand Without Arm Support  - 1 x daily - 3-4 x weekly - 3 sets - 12 reps - Standing Hip Abduction with Counter Support  - 1 x daily - 3-4 x weekly - 3 sets - 12 reps - Seated Hamstring Stretch  - 1 x daily - 7 x weekly - 1 sets - 2 reps - 30 hold  ASSESSMENT:  CLINICAL IMPRESSION: Continuing PT POC  with focus on hip strength, LE flexibility, and addition of core stability exercises. Pt progressing in core stability and hip strengthening tolerance. Continues to rely on min to mod multi modal cuing for form/technique due to baseline dementia but spouse present to assist at home with HEP. Encouraged to f/u with updated exercises to address hip flexor mobility and to improve core stability. Pt will benefit from skilled PT services to address these impairments and maximize independence with functional mobility.  OBJECTIVE IMPAIRMENTS: Abnormal gait, decreased activity tolerance, decreased balance, decreased endurance, decreased mobility, difficulty walking, decreased ROM, decreased strength, impaired flexibility, postural dysfunction, and pain.   ACTIVITY LIMITATIONS: carrying, lifting, bending, standing, squatting, stairs, and locomotion level  PARTICIPATION LIMITATIONS: community activity  PERSONAL FACTORS: Age, Past/current experiences, Time since onset of injury/illness/exacerbation, and 3+ comorbidities: Dementia, hx of prostate cancer, HLD, HTN   are also affecting patient's functional outcome.   REHAB POTENTIAL: Fair Chronicity of symptoms  CLINICAL DECISION MAKING: Evolving/moderate complexity  EVALUATION COMPLEXITY: Moderate   GOALS: Goals reviewed with patient? No  SHORT TERM GOALS: Target date: 01/03/24  Pt will be independent with HEP to improve lumbar ROM, LE strength, and endurance to improve functional capacity.  Baseline: 12/06/23: provided HEP Goal status: INITIAL  LONG TERM GOALS: Target date: 01/31/24  Pt will improve FOTO to target score  to demonstrate clinically significant improvement in functional mobility.  Baseline: 12/06/23: Deferred to next session; 12/08/23: 49/58 Goal status: INITIAL  2.  Pt will improve 5xSTS by at least 2.3 seconds to improve LE strength for completing standing and walking ADL's.  Baseline: 12/06/23: 14.81 seconds Goal status: INITIAL  3.   Pt will improve 6 MWT by at least 145' to demonstrate clinically significant improvement in completing community walking distances.  Baseline: 12/06/23; Completed 2 MWT at 361'.; 12/08/23: 952'. Goal status: INITIAL  PLAN:  PT FREQUENCY: 1-2x/week  PT DURATION: 8 weeks  PLANNED INTERVENTIONS: 97164- PT Re-evaluation, 97110-Therapeutic exercises, 97530- Therapeutic activity, 97112- Neuromuscular re-education, 97535- Self Care, 65784- Manual therapy, [phone]- Gait training, 97014- Electrical stimulation (unattended), [phone]- Electrical stimulation (manual), Patient/Family education, Balance training, Stair training, Joint mobilization, Joint manipulation, Spinal manipulation, Spinal mobilization, Cryotherapy, and Moist heat.  PLAN FOR NEXT SESSION: Prone rec fem stretch, hip and core strengthening. Hip flexor strengthening for foot clearance.    Delphia Grates. Fairly IV, PT, DPT Physical Therapist- [place]  [facility]  12/22/2023, 12:15 PM

## 2023-12-26 NOTE — Patient Instructions (Signed)
 Be Involved in Caring For Your Health:  Taking Medications When medications are taken as directed, they can greatly improve your health. But if they are not taken as prescribed, they may not work. In some cases, not taking them correctly can be harmful. To help ensure your treatment remains effective and safe, understand your medications and how to take them. Bring your medications to each visit for review by your provider.  Your lab results, notes, and after visit summary will be available on My Chart. We strongly encourage you to use this feature. If lab results are abnormal the clinic will contact you with the appropriate steps. If the clinic does not contact you assume the results are satisfactory. You can always view your results on My Chart. If you have questions regarding your health or results, please contact the clinic during office hours. You can also ask questions on My Chart.  We at [facility] are grateful that you chose [place] to provide your care. We strive to provide evidence-based and compassionate care and are always looking for feedback. If you get a survey from the clinic please complete this so we can hear your opinions.  Heart-Healthy Eating Plan Many factors influence your heart health, including eating and exercise habits. Heart health is also called coronary health. Coronary risk increases with abnormal blood fat (lipid) levels. A heart-healthy eating plan includes limiting unhealthy fats, increasing healthy fats, limiting salt (sodium) intake, and making other diet and lifestyle changes. What is my plan? Your health care provider may recommend that: You limit your fat intake to _________% or less of your total calories each day. You limit your saturated fat intake to _________% or less of your total calories each day. You limit the amount of cholesterol in your diet to less than _________ mg per day. You limit the amount of sodium in your diet to less than _________  mg per day. What are tips for following this plan? Cooking Cook foods using methods other than frying. Baking, boiling, grilling, and broiling are all good options. Other ways to reduce fat include: Removing the skin from poultry. Removing all visible fats from meats. Steaming vegetables in water or broth. Meal planning  At meals, imagine dividing your plate into fourths: Fill one-half of your plate with vegetables and green salads. Fill one-fourth of your plate with whole grains. Fill one-fourth of your plate with lean protein foods. Eat 2-4 cups of vegetables per day. One cup of vegetables equals 1 cup (91 g) broccoli or cauliflower florets, 2 medium carrots, 1 large bell pepper, 1 large sweet potato, 1 large tomato, 1 medium white potato, 2 cups (150 g) raw leafy greens. Eat 1-2 cups of fruit per day. One cup of fruit equals 1 small apple, 1 large banana, 1 cup (237 g) mixed fruit, 1 large orange,  cup (82 g) dried fruit, 1 cup (240 mL) 100% fruit juice. Eat more foods that contain soluble fiber. Examples include apples, broccoli, carrots, beans, peas, and barley. Aim to get 25-30 g of fiber per day. Increase your consumption of legumes, nuts, and seeds to 4-5 servings per week. One serving of dried beans or legumes equals  cup (90 g) cooked, 1 serving of nuts is  oz (12 almonds, 24 pistachios, or 7 walnut halves), and 1 serving of seeds equals  oz (8 g). Fats Choose healthy fats more often. Choose monounsaturated and polyunsaturated fats, such as olive and canola oils, avocado oil, flaxseeds, walnuts, almonds, and seeds. Eat  more omega-3 fats. Choose salmon, mackerel, sardines, tuna, flaxseed oil, and ground flaxseeds. Aim to eat fish at least 2 times each week. Check food labels carefully to identify foods with trans fats or high amounts of saturated fat. Limit saturated fats. These are found in animal products, such as meats, butter, and cream. Plant sources of saturated fats  include palm oil, palm kernel oil, and coconut oil. Avoid foods with partially hydrogenated oils in them. These contain trans fats. Examples are stick margarine, some tub margarines, cookies, crackers, and other baked goods. Avoid fried foods. General information Eat more home-cooked food and less restaurant, buffet, and fast food. Limit or avoid alcohol. Limit foods that are high in added sugar and simple starches such as foods made using white refined flour (white breads, pastries, sweets). Lose weight if you are overweight. Losing just 5-10% of your body weight can help your overall health and prevent diseases such as diabetes and heart disease. Monitor your sodium intake, especially if you have high blood pressure. Talk with your health care provider about your sodium intake. Try to incorporate more vegetarian meals weekly. What foods should I eat? Fruits All fresh, canned (in natural juice), or frozen fruits. Vegetables Fresh or frozen vegetables (raw, steamed, roasted, or grilled). Green salads. Grains Most grains. Choose whole wheat and whole grains most of the time. Rice and pasta, including brown rice and pastas made with whole wheat. Meats and other proteins Lean, well-trimmed beef, veal, pork, and lamb. Chicken and [place] without skin. All fish and shellfish. Wild duck, rabbit, pheasant, and venison. Egg whites or low-cholesterol egg substitutes. Dried beans, peas, lentils, and tofu. Seeds and most nuts. Dairy Low-fat or nonfat cheeses, including ricotta and mozzarella. Skim or 1% milk (liquid, powdered, or evaporated). Buttermilk made with low-fat milk. Nonfat or low-fat yogurt. Fats and oils Non-hydrogenated (trans-free) margarines. Vegetable oils, including soybean, sesame, sunflower, olive, avocado, peanut, safflower, corn, canola, and cottonseed. Salad dressings or mayonnaise made with a vegetable oil. Beverages Water (mineral or sparkling). Coffee and tea. Unsweetened ice  tea. Diet beverages. Sweets and desserts Sherbet, gelatin, and fruit ice. Small amounts of dark chocolate. Limit all sweets and desserts. Seasonings and condiments All seasonings and condiments. The items listed above may not be a complete list of foods and beverages you can eat. Contact a dietitian for more options. What foods should I avoid? Fruits Canned fruit in heavy syrup. Fruit in cream or butter sauce. Fried fruit. Limit coconut. Vegetables Vegetables cooked in cheese, cream, or butter sauce. Fried vegetables. Grains Breads made with saturated or trans fats, oils, or whole milk. Croissants. Sweet rolls. Donuts. High-fat crackers, such as cheese crackers and chips. Meats and other proteins Fatty meats, such as hot dogs, ribs, sausage, bacon, rib-eye roast or steak. High-fat deli meats, such as salami and bologna. Caviar. Domestic duck and goose. Organ meats, such as liver. Dairy Cream, sour cream, cream cheese, and creamed cottage cheese. Whole-milk cheeses. Whole or 2% milk (liquid, evaporated, or condensed). Whole buttermilk. Cream sauce or high-fat cheese sauce. Whole-milk yogurt. Fats and oils Meat fat, or shortening. Cocoa butter, hydrogenated oils, palm oil, coconut oil, palm kernel oil. Solid fats and shortenings, including bacon fat, salt pork, lard, and butter. Nondairy cream substitutes. Salad dressings with cheese or sour cream. Beverages Regular sodas and any drinks with added sugar. Sweets and desserts Frosting. Pudding. Cookies. Cakes. Pies. Milk chocolate or white chocolate. Buttered syrups. Full-fat ice cream or ice cream drinks. The items listed above may  not be a complete list of foods and beverages to avoid. Contact a dietitian for more information. Summary Heart-healthy meal planning includes limiting unhealthy fats, increasing healthy fats, limiting salt (sodium) intake and making other diet and lifestyle changes. Lose weight if you are overweight. Losing just  5-10% of your body weight can help your overall health and prevent diseases such as diabetes and heart disease. Focus on eating a balance of foods, including fruits and vegetables, low-fat or nonfat dairy, lean protein, nuts and legumes, whole grains, and heart-healthy oils and fats. This information is not intended to replace advice given to you by your health care provider. Make sure you discuss any questions you have with your health care provider. Document Revised: 12/15/2021 Document Reviewed: 12/15/2021 Elsevier Patient Education  2024 [employer].

## 2023-12-27 ENCOUNTER — Ambulatory Visit: Payer: PPO | Attending: Orthopedic Surgery

## 2023-12-27 DIAGNOSIS — R262 Difficulty in walking, not elsewhere classified: Secondary | ICD-10-CM | POA: Insufficient documentation

## 2023-12-27 DIAGNOSIS — R293 Abnormal posture: Secondary | ICD-10-CM | POA: Diagnosis not present

## 2023-12-27 DIAGNOSIS — M5459 Other low back pain: Secondary | ICD-10-CM | POA: Diagnosis not present

## 2023-12-27 DIAGNOSIS — M6281 Muscle weakness (generalized): Secondary | ICD-10-CM | POA: Diagnosis not present

## 2023-12-27 NOTE — Therapy (Signed)
OUTPATIENT PHYSICAL THERAPY THORACOLUMBAR TREATMENT   Patient Name: Christopher Huang MRN: [id number] DOB:[birth year], 73 y.o., male Today's Date: 12/27/2023  END OF SESSION:  PT End of Session - 12/27/23 1123     Visit Number 5    Number of Visits 17    Date for PT Re-Evaluation 01/31/24    PT Start Time 1113    PT Stop Time 1157    PT Time Calculation (min) 44 min    Activity Tolerance Patient tolerated treatment well    Behavior During Therapy [facility] for tasks assessed/performed              Past Medical History:  Diagnosis Date   Allergic rhinitis    Cancer of prostate (HCC) 02/2007   s/p surgery   Dementia (HCC)    History of kidney stones    H/O   Hyperlipidemia    Hypertension    Personal history of kidney stones    Past Surgical History:  Procedure Laterality Date   ANTERIOR LATERAL LUMBAR FUSION WITH PERCUTANEOUS SCREW 1 LEVEL N/A 01/29/2021   Procedure: L4-5 LATERAL INTERBODY FUSION, L4-5 POSTERIOR FUSION;  Surgeon: Venetia Night, MD;  Location: [facility] ORS;  Service: Neurosurgery;  Laterality: N/A;   basal skin cancers     CATARACT EXTRACTION, BILATERAL     2021   COLONOSCOPY WITH PROPOFOL N/A 11/09/2017   Procedure: COLONOSCOPY WITH PROPOFOL;  Surgeon: Midge Minium, MD;  Location: [facility] ENDOSCOPY;  Service: Endoscopy;  Laterality: N/A;   COLONOSCOPY WITH PROPOFOL N/A 12/01/2022   Procedure: COLONOSCOPY WITH PROPOFOL;  Surgeon: Midge Minium, MD;  Location: [facility] ENDOSCOPY;  Service: Endoscopy;  Laterality: N/A;   EYE SURGERY Bilateral    Cataract   HAND SURGERY     THUMB SURGERY   KNEE ARTHROSCOPY     prostate cancer removed     TONSILLECTOMY     TONSILLECTOMY     TOTAL KNEE ARTHROPLASTY Left 02/09/2020   Procedure: TOTAL KNEE ARTHROPLASTY - RNFA;  Surgeon: Kennedy Bucker, MD;  Location: [facility] ORS;  Service: Orthopedics;  Laterality: Left;   Patient Active Problem List   Diagnosis Date Noted   Failed back surgical syndrome 03/22/2023   Lumbar facet  arthropathy 12/07/2022   Spinal stenosis, lumbar region, without neurogenic claudication (L3/4) 12/07/2022   Lumbar degenerative disc disease 12/07/2022   Chronic knee pain after total replacement of left knee joint 12/07/2022   Polyp of ascending colon 12/01/2022   Heart failure with reduced ejection fraction (HCC) 10/25/2022   LBBB (left bundle branch block) 05/24/2022   Frontotemporal dementia (HCC) 03/27/2022   Vitamin D deficiency 09/26/2021   History of back surgery 03/07/2021   Knee joint replacement status, left 02/09/2020   Advanced care planning/counseling discussion 02/02/2019   Arthritis of hand 11/25/2018   History of colonic polyps    Allergic rhinitis    History of prostate cancer    Hyperlipidemia    Hypertension    Personal history of kidney stones     PCP: Aura Dials, T NP  REFERRING PROVIDER: Drake Leach, PA-C  REFERRING DIAG: Z98.1 (ICD-10-CM) - S/P lumbar fusionM47.816 (ICD-10-CM) - Lumbar spondylosisM48.061 (ICD-10-CM) - Spinal stenosis of lumbar region, unspecified whether neurogenic claudication presentM43.16 (ICD-10-CM) - Spondylolisthesis of lumbar regionM51.360 (ICD-10-CM) - Degeneration of intervertebral disc of lumbar region with discogenic back pain  Rationale for Evaluation and Treatment: Rehabilitation  THERAPY DIAG:  Other low back pain  Abnormal posture  Muscle weakness (generalized)  Difficulty in walking, not elsewhere classified  ONSET  DATE: Chronic. Has had it for many years.  SUBJECTIVE:                                                                                                                                                                                           SUBJECTIVE STATEMENT: Pt reports he continues to improve and do well. Pt and spouse report good adherence with HEP.   PERTINENT HISTORY:  Pt's spouse assisting in subjective. Has had prior lumbar surgery. Sounds like he has had adjacent lumbar changes above his  hardware and spouse states they (MD's) believe this is his source of pain. Spouse reports poor endurance for standing and walking. Pt and spouse state his walking tolerance is only 2-3 minutes. Severe LBP is what is limiting him from progressing, not his LE strength denying neurogenic claudication symptoms. Pain is central, and lower. Reports stabbing pain with bending over, standing, walking. Denies use of a SPC or RW to assist in balance/endurance. Worst pain described worst pain as a 5/10 NPS with aggravating activities. Best pain around 1/10 NPS. Does endorse stiffness and acheiness with weather changes and mornings. Reports forward leaning with walking. Does wear back brace regularly which he states helps his pain.   PAIN:  Are you having pain? Yes: NPRS scale: 0/10 NPS Pain location: Central, lower back Pain description: sharp, stiff, achey Aggravating factors: activity, standing, walking, bending Relieving factors: rest, sitting, back brace  PRECAUTIONS: None  RED FLAGS: Bowel or bladder incontinence: No and Cauda equina syndrome: No   WEIGHT BEARING RESTRICTIONS: No  FALLS:  Has patient fallen in last 6 months? No  LIVING ENVIRONMENT: Lives with: lives with their spouse Lives in: House/apartment Stairs: Yes: External: 3-4 steps; bilateral but cannot reach both Has following equipment at home: None  OCCUPATION: Retired   PLOF: Independent  PATIENT GOALS: To improve his pain and endurance.   NEXT MD VISIT: N/A  OBJECTIVE:  Note: Objective measures were completed at Evaluation unless otherwise noted.  DIAGNOSTIC FINDINGS:  Lumbar MRI dated 10/13/23:  FINDINGS: Segmentation: Transitional lumbosacral anatomy with a partially lumbarized S1 segment, as before. Utilizing this numbering, the patient has undergone previous PLIF at L4-5.   Alignment: The alignment is stable with a mild scoliosis, a grade 1 retrolisthesis at L2-3 and a grade 1 anterolisthesis at L4-5  and L5-S1.   Vertebrae: No worrisome osseous lesion, acute fracture or pars defect. Stable L3 hemangioma. Stable postsurgical changes from L4-5 PLIF.   Conus medullaris: Extends to the T12-L1 level and appears normal.   Paraspinal and other soft tissues: No significant paraspinal findings.   Disc levels:   Sagittal images  demonstrate no significant disc space findings within the visualized lower thoracic spine.   L1-2: Only imaged in the sagittal plane. Preserved disc height with mild bilateral facet hypertrophy. No significant spinal stenosis or nerve root encroachment.   L2-3: Stable chronic loss of disc height with annular disc bulging and endplate osteophytes asymmetric to the right. Moderate facet and ligamentous hypertrophy. Unchanged mild multifactorial spinal stenosis with asymmetric narrowing of the right lateral recess, moderate right and mild left foraminal narrowing.   L3-4: Mild loss of disc height with annular disc bulging and endplate osteophytes asymmetric to the right. There advanced facet and ligamentous hypertrophy contributing to stable moderate to severe multifactorial spinal stenosis. Mild lateral recess and foraminal narrowing, right greater than left, is unchanged.   L4-5: Previous posterior decompression and PLIF. The spinal canal and neural foramina are widely patent.   L5-S1: Chronic mild loss of disc height with annular disc bulging and endplate osteophytes asymmetric to the left. Mild right and moderate left foraminal narrowing. The spinal canal, lateral recesses and right foramen remain widely patent. There is chronic moderate left foraminal narrowing.   S1-2: Transitional disc space level appears stable, without spinal stenosis or nerve root encroachment.   IMPRESSION: 1. No acute findings or significant changes compared with previous study from 11 months prior. 2. Stable mild multifactorial spinal stenosis at L2-3 with asymmetric  narrowing of the right lateral recess, moderate right and mild left foraminal narrowing. 3. Stable moderate to severe multifactorial spinal stenosis at L3-4 with mild lateral recess and foraminal narrowing, right greater than left. 4. Stable postsurgical changes at L4-5 without residual or recurrent spinal stenosis. 5. Stable moderate chronic left foraminal narrowing at L5-S1.     Electronically Signed   By: Carey Bullocks M.D.   On: 10/31/2023 17:23  PATIENT SURVEYS:  FOTO 49/58  COGNITION: Overall cognitive status: History of cognitive impairments - at baseline     SENSATION: WFL  MUSCLE LENGTH: Hamstrings: Right 60 deg; Left 60 deg Thomas test: Positive specifically for rectus femoris tightness bilaterally  POSTURE: decreased lumbar lordosis, increased thoracic kyphosis, and hinging at hip flexors in standing  PALPATION: TTP at L1-L3 at spinous process and L sided paraspinals  LUMBAR ROM:   AROM eval  Flexion 75%  Extension 25%*  Right lateral flexion 50%  Left lateral flexion 50%*  Right rotation 100%  Left rotation 100%   (Blank rows = not tested)  LOWER EXTREMITY ROM:     Active  Right eval Left eval  Hip flexion    Hip extension    Hip abduction    Hip adduction    Hip internal rotation    Hip external rotation    Knee flexion    Knee extension    Ankle dorsiflexion    Ankle plantarflexion    Ankle inversion    Ankle eversion     (Blank rows = not tested)  LOWER EXTREMITY MMT:    MMT Right eval Left eval  Hip flexion 5 5  Hip extension 3 2  Hip abduction 3 3  Hip adduction    Hip internal rotation 4 4  Hip external rotation 5 5  Knee flexion 5 5  Knee extension 5 5  Ankle dorsiflexion 5 5  Ankle plantarflexion 5 4  Ankle inversion    Ankle eversion     (Blank rows = not tested)  LUMBAR SPECIAL TESTS:  Straight leg raise test: Negative, Slump test: Negative, and FABER test: Negative  FADDIR: Negative  FUNCTIONAL TESTS:  5  times sit to stand: 14.81 sec 2 minute walk test: 360'  GAIT: Distance walked: 300' Assistive device utilized: None Level of assistance: Complete Independence Comments: Crouched gait, decreased R/L foot clearance  TREATMENT DATE: 12/20/23  There.ex: Nu-Step L5 for 5 min. BUE and LE use for lumbar warm up. Not billed.  Dead bugs: trialed alternative arm/leg but pt reliant on max multi modal cuing due to dementia. X5/side. X5, 25 sec holds   Red physioball B shoulder flexion on wall to promote upright posture and thoracic extension: x12   There.Act: STS 2x8 with 15# med ball with ball slams.   2nd step lunge for quad strength and contralateral hip flexor stretch: x8/LE. Mod Multimodal cuing for form/technique.   Sled pushes: 50#, 300'  Lateral hurdle step overs for foot clearance and hip flexor and hip abductor strength and single limb strength. CGA, x6 laps with 6 hurdles. Min VC's for maintaining posture with fair carryover.   PATIENT EDUCATION:  Education details: HEP, POC Person educated: Patient and Spouse Education method: Explanation, Demonstration, and Handouts Education comprehension: verbalized understanding and needs further education  HOME EXERCISE PROGRAM: Access Code: [email or web address] URL: [email or web address] Date: 12/20/2023 Prepared by: Ronnie Derby  Exercises - Isometric Dead Bug  - 1 x daily - 3-4 x weekly - 1 sets - 5 reps - 15 hold - Sit to Stand Without Arm Support  - 1 x daily - 3-4 x weekly - 3 sets - 12 reps - Standing Hip Abduction with Counter Support  - 1 x daily - 3-4 x weekly - 3 sets - 12 reps - Prone Quad Stretch with Towel Roll and Strap  - 1 x daily - 7 x weekly - 1 sets - 3 reps - 30 hold - Seated Hamstring Stretch  - 1 x daily - 7 x weekly - 1 sets - 2 reps - 30 hold   Access Code: [email or web address] URL: [email or web address] Date: 12/06/2023 Prepared by: Ronnie Derby  Exercises - Sit to Stand Without Arm Support  -  1 x daily - 3-4 x weekly - 3 sets - 12 reps - Standing Hip Abduction with Counter Support  - 1 x daily - 3-4 x weekly - 3 sets - 12 reps - Seated Hamstring Stretch  - 1 x daily - 7 x weekly - 1 sets - 2 reps - 30 hold  ASSESSMENT:  CLINICAL IMPRESSION: Continuing PT POC with focus on hip strength, LE flexibility, and addition of core stability exercises. Pt progressing with overall volume of resistance training with greater levels of resistance. Pt remains having difficulty with multi-step exercise due to his dementia needing max multi modal cuing for these exercises. Pt does vastly better with 1-2 step exercises with improved form/technique. Pt remains with postural abnormality, gait deficits and LE/core weakness. Pt will benefit from skilled PT services to address these impairments and maximize independence with functional mobility.  OBJECTIVE IMPAIRMENTS: Abnormal gait, decreased activity tolerance, decreased balance, decreased endurance, decreased mobility, difficulty walking, decreased ROM, decreased strength, impaired flexibility, postural dysfunction, and pain.   ACTIVITY LIMITATIONS: carrying, lifting, bending, standing, squatting, stairs, and locomotion level  PARTICIPATION LIMITATIONS: community activity  PERSONAL FACTORS: Age, Past/current experiences, Time since onset of injury/illness/exacerbation, and 3+ comorbidities: Dementia, hx of prostate cancer, HLD, HTN   are also affecting patient's functional outcome.   REHAB POTENTIAL: Fair Chronicity of symptoms  CLINICAL DECISION MAKING: Evolving/moderate complexity  EVALUATION COMPLEXITY: Moderate   GOALS: Goals reviewed with patient? No  SHORT TERM GOALS: Target date: 01/03/24  Pt will be independent with HEP to improve lumbar ROM, LE strength, and endurance to improve functional capacity.  Baseline: 12/06/23: provided HEP Goal status: INITIAL  LONG TERM GOALS: Target date: 01/31/24  Pt will improve FOTO to target score to  demonstrate clinically significant improvement in functional mobility.  Baseline: 12/06/23: Deferred to next session; 12/08/23: 49/58 Goal status: INITIAL  2.  Pt will improve 5xSTS by at least 2.3 seconds to improve LE strength for completing standing and walking ADL's.  Baseline: 12/06/23: 14.81 seconds Goal status: INITIAL  3.  Pt will improve 6 MWT by at least 145' to demonstrate clinically significant improvement in completing community walking distances.  Baseline: 12/06/23; Completed 2 MWT at 361'.; 12/08/23: 952'. Goal status: INITIAL  PLAN:  PT FREQUENCY: 1-2x/week  PT DURATION: 8 weeks  PLANNED INTERVENTIONS: 97164- PT Re-evaluation, 97110-Therapeutic exercises, 97530- Therapeutic activity, 97112- Neuromuscular re-education, 97535- Self Care, 40981- Manual therapy, [phone]- Gait training, 97014- Electrical stimulation (unattended), [phone]- Electrical stimulation (manual), Patient/Family education, Balance training, Stair training, Joint mobilization, Joint manipulation, Spinal manipulation, Spinal mobilization, Cryotherapy, and Moist heat.  PLAN FOR NEXT SESSION: Prone rec fem stretch, hip and core strengthening. Hip flexor strengthening for foot clearance.    Delphia Grates. Fairly IV, PT, DPT Physical Therapist- [facility]  12/27/2023, 12:08 PM

## 2023-12-29 ENCOUNTER — Ambulatory Visit: Payer: PPO

## 2023-12-29 DIAGNOSIS — M6281 Muscle weakness (generalized): Secondary | ICD-10-CM

## 2023-12-29 DIAGNOSIS — M5459 Other low back pain: Secondary | ICD-10-CM | POA: Diagnosis not present

## 2023-12-29 DIAGNOSIS — R293 Abnormal posture: Secondary | ICD-10-CM

## 2023-12-29 DIAGNOSIS — R262 Difficulty in walking, not elsewhere classified: Secondary | ICD-10-CM

## 2023-12-29 NOTE — Therapy (Signed)
 OUTPATIENT PHYSICAL THERAPY THORACOLUMBAR TREATMENT   Patient Name: Christopher Huang MRN: [id number] DOB:[birth year], 73 y.o., male Today's Date: 12/29/2023  END OF SESSION:  PT End of Session - 12/29/23 1037     Visit Number 6    Number of Visits 17    Date for PT Re-Evaluation 01/31/24    PT Start Time 1033    PT Stop Time 1115    PT Time Calculation (min) 42 min    Activity Tolerance Patient tolerated treatment well    Behavior During Therapy [facility] for tasks assessed/performed              Past Medical History:  Diagnosis Date   Allergic rhinitis    Cancer of prostate (HCC) 02/2007   s/p surgery   Dementia (HCC)    History of kidney stones    H/O   Hyperlipidemia    Hypertension    Personal history of kidney stones    Past Surgical History:  Procedure Laterality Date   ANTERIOR LATERAL LUMBAR FUSION WITH PERCUTANEOUS SCREW 1 LEVEL N/A 01/29/2021   Procedure: L4-5 LATERAL INTERBODY FUSION, L4-5 POSTERIOR FUSION;  Surgeon: Clois Fret, MD;  Location: [facility] OR[facility];  Service: Neurosurgery;  Laterality: N/A;   basal skin cancers     CATARACT EXTRACTION, BILATERAL     2021   COLONOSCOPY WITH PROPOFOL  N/A 11/09/2017   Procedure: COLONOSCOPY WITH PROPOFOL ;  Surgeon: Jinny Carmine, MD;  Location: [facility];  Service: Endoscopy;  Laterality: N/A;   COLONOSCOPY WITH PROPOFOL  N/A 12/01/2022   Procedure: COLONOSCOPY WITH PROPOFOL ;  Surgeon: Jinny Carmine, MD;  Location: [facility];  Service: Endoscopy;  Laterality: N/A;   EYE SURGERY Bilateral    Cataract   HAND SURGERY     THUMB SURGERY   KNEE ARTHROSCOPY     prostate cancer removed     TONSILLECTOMY     TONSILLECTOMY     TOTAL KNEE ARTHROPLASTY Left 02/09/2020   Procedure: TOTAL KNEE ARTHROPLASTY - RNFA;  Surgeon: Kathlynn Sharper, MD;  Location: [facility] OR[facility];  Service: Orthopedics;  Laterality: Left;   Patient Active Problem List   Diagnosis Date Noted   Failed back surgical syndrome 03/22/2023   Lumbar facet  arthropathy 12/07/2022   Spinal stenosis, lumbar region, without neurogenic claudication (L3/4) 12/07/2022   Lumbar degenerative disc disease 12/07/2022   Chronic knee pain after total replacement of left knee joint 12/07/2022   Polyp of ascending colon 12/01/2022   Heart failure with reduced ejection fraction (HCC) 10/25/2022   LBBB (left bundle branch block) 05/24/2022   Frontotemporal dementia (HCC) 03/27/2022   Vitamin D  deficiency 09/26/2021   History of back surgery 03/07/2021   Knee joint replacement status, left 02/09/2020   Advanced care planning/counseling discussion 02/02/2019   Arthritis of hand 11/25/2018   History of colonic polyps    Allergic rhinitis    History of prostate cancer    Hyperlipidemia    Hypertension    Personal history of kidney stones     PCP: Cannady, Jolene, T NP  REFERRING PROVIDER: Hilma Hastings, PA-C  REFERRING DIAG: Z98.1 (ICD-10-CM) - S/P lumbar fusionM47.816 (ICD-10-CM) - Lumbar spondylosisM48.061 (ICD-10-CM) - Spinal stenosis of lumbar region, unspecified whether neurogenic claudication presentM43.16 (ICD-10-CM) - Spondylolisthesis of lumbar regionM51.360 (ICD-10-CM) - Degeneration of intervertebral disc of lumbar region with discogenic back pain  Rationale for Evaluation and Treatment: Rehabilitation  THERAPY DIAG:  Other low back pain  Abnormal posture  Muscle weakness (generalized)  Difficulty in walking, not elsewhere classified  ONSET  DATE: Chronic. Has had it for many years.  SUBJECTIVE:                                                                                                                                                                                           SUBJECTIVE STATEMENT: Pt reports no pain currently no falls. Still has some pain with walking.   PERTINENT HISTORY:  Pt's spouse assisting in subjective. Has had prior lumbar surgery. Sounds like he has had adjacent lumbar changes above his hardware and spouse  states they (MD's) believe this is his source of pain. Spouse reports poor endurance for standing and walking. Pt and spouse state his walking tolerance is only 2-3 minutes. Severe LBP is what is limiting him from progressing, not his LE strength denying neurogenic claudication symptoms. Pain is central, and lower. Reports stabbing pain with bending over, standing, walking. Denies use of a SPC or RW to assist in balance/endurance. Worst pain described worst pain as a 5/10 NPS with aggravating activities. Best pain around 1/10 NPS. Does endorse stiffness and acheiness with weather changes and mornings. Reports forward leaning with walking. Does wear back brace regularly which he states helps his pain.   PAIN:  Are you having pain? Yes: NPRS scale: 0/10 NPS Pain location: Central, lower back Pain description: sharp, stiff, achey Aggravating factors: activity, standing, walking, bending Relieving factors: rest, sitting, back brace  PRECAUTIONS: None  RED FLAGS: Bowel or bladder incontinence: No and Cauda equina syndrome: No   WEIGHT BEARING RESTRICTIONS: No  FALLS:  Has patient fallen in last 6 months? No  LIVING ENVIRONMENT: Lives with: lives with their spouse Lives in: House/apartment Stairs: Yes: External: 3-4 steps; bilateral but cannot reach both Has following equipment at home: None  OCCUPATION: Retired   PLOF: Independent  PATIENT GOALS: To improve his pain and endurance.   NEXT MD VISIT: N/A  OBJECTIVE:  Note: Objective measures were completed at Evaluation unless otherwise noted.  DIAGNOSTIC FINDINGS:  Lumbar MRI dated 10/13/23:  FINDINGS: Segmentation: Transitional lumbosacral anatomy with a partially lumbarized S1 segment, as before. Utilizing this numbering, the patient has undergone previous PLIF at L4-5.   Alignment: The alignment is stable with a mild scoliosis, a grade 1 retrolisthesis at L2-3 and a grade 1 anterolisthesis at L4-5 and L5-S1.   Vertebrae: No  worrisome osseous lesion, acute fracture or pars defect. Stable L3 hemangioma. Stable postsurgical changes from L4-5 PLIF.   Conus medullaris: Extends to the T12-L1 level and appears normal.   Paraspinal and other soft tissues: No significant paraspinal findings.   Disc levels:   Sagittal images demonstrate no significant disc  space findings within the visualized lower thoracic spine.   L1-2: Only imaged in the sagittal plane. Preserved disc height with mild bilateral facet hypertrophy. No significant spinal stenosis or nerve root encroachment.   L2-3: Stable chronic loss of disc height with annular disc bulging and endplate osteophytes asymmetric to the right. Moderate facet and ligamentous hypertrophy. Unchanged mild multifactorial spinal stenosis with asymmetric narrowing of the right lateral recess, moderate right and mild left foraminal narrowing.   L3-4: Mild loss of disc height with annular disc bulging and endplate osteophytes asymmetric to the right. There advanced facet and ligamentous hypertrophy contributing to stable moderate to severe multifactorial spinal stenosis. Mild lateral recess and foraminal narrowing, right greater than left, is unchanged.   L4-5: Previous posterior decompression and PLIF. The spinal canal and neural foramina are widely patent.   L5-S1: Chronic mild loss of disc height with annular disc bulging and endplate osteophytes asymmetric to the left. Mild right and moderate left foraminal narrowing. The spinal canal, lateral recesses and right foramen remain widely patent. There is chronic moderate left foraminal narrowing.   S1-2: Transitional disc space level appears stable, without spinal stenosis or nerve root encroachment.   IMPRESSION: 1. No acute findings or significant changes compared with previous study from 11 months prior. 2. Stable mild multifactorial spinal stenosis at L2-3 with asymmetric narrowing of the right lateral  recess, moderate right and mild left foraminal narrowing. 3. Stable moderate to severe multifactorial spinal stenosis at L3-4 with mild lateral recess and foraminal narrowing, right greater than left. 4. Stable postsurgical changes at L4-5 without residual or recurrent spinal stenosis. 5. Stable moderate chronic left foraminal narrowing at L5-S1.     Electronically Signed   By: Elsie Perone M.D.   On: 10/31/2023 17:23  PATIENT SURVEYS:  FOTO 49/58  COGNITION: Overall cognitive status: History of cognitive impairments - at baseline     SENSATION: WFL  MUSCLE LENGTH: Hamstrings: Right 60 deg; Left 60 deg Thomas test: Positive specifically for rectus femoris tightness bilaterally  POSTURE: decreased lumbar lordosis, increased thoracic kyphosis, and hinging at hip flexors in standing  PALPATION: TTP at L1-L3 at spinous process and L sided paraspinals  LUMBAR ROM:   AROM eval  Flexion 75%  Extension 25%*  Right lateral flexion 50%  Left lateral flexion 50%*  Right rotation 100%  Left rotation 100%   (Blank rows = not tested)  LOWER EXTREMITY ROM:     Active  Right eval Left eval  Hip flexion    Hip extension    Hip abduction    Hip adduction    Hip internal rotation    Hip external rotation    Knee flexion    Knee extension    Ankle dorsiflexion    Ankle plantarflexion    Ankle inversion    Ankle eversion     (Blank rows = not tested)  LOWER EXTREMITY MMT:    MMT Right eval Left eval  Hip flexion 5 5  Hip extension 3 2  Hip abduction 3 3  Hip adduction    Hip internal rotation 4 4  Hip external rotation 5 5  Knee flexion 5 5  Knee extension 5 5  Ankle dorsiflexion 5 5  Ankle plantarflexion 5 4  Ankle inversion    Ankle eversion     (Blank rows = not tested)  LUMBAR SPECIAL TESTS:  Straight leg raise test: Negative, Slump test: Negative, and FABER test: Negative  FADDIR: Negative   FUNCTIONAL TESTS:  5 times sit to stand: 14.81 sec 2  minute walk test: 360'  GAIT: Distance walked: 300' Assistive device utilized: None Level of assistance: Complete Independence Comments: Crouched gait, decreased R/L foot clearance  TREATMENT DATE: 12/29/23  There.ex: Nu-Step L5 for 5 min. BUE and LE use for lumbar warm up. Not billed.   There.Act: STS 2x8 with 15# med ball with ball slams. Mod VC's for form/technique   2x300' resisted gait with 4# AW's donned, CGA. Mod VC's for foot clearance, upright posture. Seated rest b/t. No reports of LBP.  Red physioball B shoulder flexion on wall to promote upright posture and thoracic extension: 2x12  Standing scap retractions with black TB: 3x12. Mod TC's for posture and back/periscapular strengthening.   Reviewed updated HEP. Educated on performing scap retractions for postural re-ed and strengthening to limit anterior trunk lean.   PATIENT EDUCATION:  Education details: HEP, POC Person educated: Patient and Spouse Education method: Explanation, Demonstration, and Handouts Education comprehension: verbalized understanding and needs further education  HOME EXERCISE PROGRAM: Access Code: [email or web address] URL: [email or web address] Date: 12/29/2023 Prepared by: Dorina Kingfisher  Exercises - Isometric Dead Bug  - 1 x daily - 3-4 x weekly - 1 sets - 5 reps - 15 hold - Sit to Stand Without Arm Support  - 1 x daily - 3-4 x weekly - 3 sets - 12 reps - Standing Hip Abduction with Counter Support  - 1 x daily - 3-4 x weekly - 3 sets - 12 reps - Prone Quad Stretch with Towel Roll and Strap  - 1 x daily - 7 x weekly - 1 sets - 3 reps - 30 hold - Seated Hamstring Stretch  - 1 x daily - 7 x weekly - 1 sets - 2 reps - 30 hold - Scapular Retraction with Resistance  - 1 x daily - 7 x weekly - 3 sets - 15 reps  Access Code: [email or web address] URL: [email or web address] Date: 12/20/2023 Prepared by: Dorina Kingfisher  Exercises - Isometric Dead Bug  - 1 x daily - 3-4 x weekly - 1 sets - 5  reps - 15 hold - Sit to Stand Without Arm Support  - 1 x daily - 3-4 x weekly - 3 sets - 12 reps - Standing Hip Abduction with Counter Support  - 1 x daily - 3-4 x weekly - 3 sets - 12 reps - Prone Quad Stretch with Towel Roll and Strap  - 1 x daily - 7 x weekly - 1 sets - 3 reps - 30 hold - Seated Hamstring Stretch  - 1 x daily - 7 x weekly - 1 sets - 2 reps - 30 hold   Access Code: [email or web address] URL: [email or web address] Date: 12/06/2023 Prepared by: Dorina Kingfisher  Exercises - Sit to Stand Without Arm Support  - 1 x daily - 3-4 x weekly - 3 sets - 12 reps - Standing Hip Abduction with Counter Support  - 1 x daily - 3-4 x weekly - 3 sets - 12 reps - Seated Hamstring Stretch  - 1 x daily - 7 x weekly - 1 sets - 2 reps - 30 hold  ASSESSMENT:  CLINICAL IMPRESSION: Continuing PT POC with focus on hip strength, LE flexibility, and addition of core stability exercises. Heavier emphasis today on postural training to reduce anterior trunk lean with gait to reduce falls risk. Updated HEP to reflect these efforts for periscapular and paraspinal strengthening. Pt continues to rely on mod multimodal cuing with multi step commands due to  baseline dementia with good carryover with use of cues. Pt remains with postural abnormality, gait deficits and LE/core weakness. Pt will benefit from skilled PT services to address these impairments and maximize independence with functional mobility.   OBJECTIVE IMPAIRMENTS: Abnormal gait, decreased activity tolerance, decreased balance, decreased endurance, decreased mobility, difficulty walking, decreased ROM, decreased strength, impaired flexibility, postural dysfunction, and pain.   ACTIVITY LIMITATIONS: carrying, lifting, bending, standing, squatting, stairs, and locomotion level  PARTICIPATION LIMITATIONS: community activity  PERSONAL FACTORS: Age, Past/current experiences, Time since onset of injury/illness/exacerbation, and 3+ comorbidities:  Dementia, hx of prostate cancer, HLD, HTN   are also affecting patient's functional outcome.   REHAB POTENTIAL: Fair Chronicity of symptoms  CLINICAL DECISION MAKING: Evolving/moderate complexity  EVALUATION COMPLEXITY: Moderate   GOALS: Goals reviewed with patient? No  SHORT TERM GOALS: Target date: 01/03/24  Pt will be independent with HEP to improve lumbar ROM, LE strength, and endurance to improve functional capacity.  Baseline: 12/06/23: provided HEP Goal status: INITIAL  LONG TERM GOALS: Target date: 01/31/24  Pt will improve FOTO to target score to demonstrate clinically significant improvement in functional mobility.  Baseline: 12/06/23: Deferred to next session; 12/08/23: 49/58 Goal status: INITIAL  2.  Pt will improve 5xSTS by at least 2.3 seconds to improve LE strength for completing standing and walking ADL's.  Baseline: 12/06/23: 14.81 seconds Goal status: INITIAL  3.  Pt will improve 6 MWT by at least 145' to demonstrate clinically significant improvement in completing community walking distances.  Baseline: 12/06/23; Completed 2 MWT at 361'.; 12/08/23: 952'. Goal status: INITIAL  PLAN:  PT FREQUENCY: 1-2x/week  PT DURATION: 8 weeks  PLANNED INTERVENTIONS: 97164- PT Re-evaluation, 97110-Therapeutic exercises, 97530- Therapeutic activity, 97112- Neuromuscular re-education, 97535- Self Care, [id number]- Manual therapy, [phone]- Gait training, 97014- Electrical stimulation (unattended), [phone]- Electrical stimulation (manual), Patient/Family education, Balance training, Stair training, Joint mobilization, Joint manipulation, Spinal manipulation, Spinal mobilization, Cryotherapy, and Moist heat.  PLAN FOR NEXT SESSION: single limb strength training, hip and core strengthening. Hip flexor strengthening for foot clearance.    Dorina HERO. Fairly IV, PT, DPT Physical Therapist- [place]  [facility]  12/29/2023, 11:53 AM

## 2023-12-31 ENCOUNTER — Encounter: Payer: Self-pay | Admitting: Nurse Practitioner

## 2023-12-31 ENCOUNTER — Ambulatory Visit: Payer: PPO | Admitting: Nurse Practitioner

## 2023-12-31 VITALS — BP 129/72 | HR 60 | Temp 97.5°F | Ht 74.0 in | Wt 219.2 lb

## 2023-12-31 DIAGNOSIS — E782 Mixed hyperlipidemia: Secondary | ICD-10-CM

## 2023-12-31 DIAGNOSIS — I10 Essential (primary) hypertension: Secondary | ICD-10-CM | POA: Diagnosis not present

## 2023-12-31 DIAGNOSIS — G3109 Other frontotemporal dementia: Secondary | ICD-10-CM

## 2023-12-31 DIAGNOSIS — F0280 Dementia in other diseases classified elsewhere without behavioral disturbance: Secondary | ICD-10-CM

## 2023-12-31 DIAGNOSIS — M48061 Spinal stenosis, lumbar region without neurogenic claudication: Secondary | ICD-10-CM

## 2023-12-31 DIAGNOSIS — I5020 Unspecified systolic (congestive) heart failure: Secondary | ICD-10-CM

## 2023-12-31 MED ORDER — ROSUVASTATIN CALCIUM 40 MG PO TABS: 40.0000 mg | ORAL_TABLET | Freq: Every day | ORAL | 4 refills | Status: AC

## 2023-12-31 MED ORDER — DONEPEZIL HCL 5 MG PO TABS: 5.0000 mg | ORAL_TABLET | Freq: Every day | ORAL | 4 refills | Status: DC

## 2023-12-31 NOTE — Assessment & Plan Note (Addendum)
 Chronic, ongoing.  Continue Rosuvastatin  daily.  Adjust dose as needed.  Obtain labs today: up to date.

## 2023-12-31 NOTE — Assessment & Plan Note (Signed)
 Chronic, ongoing.  BP at goal today.  Continue current medication regimen and collaboration with cardiology.  Recommend he monitor BP at least a few mornings a week at home and document.  DASH diet at home.  Labs today: up to date.

## 2023-12-31 NOTE — Assessment & Plan Note (Signed)
 Chronic, progressive.  Diagnosed 04/15/22, remains stable at this time.  Family history of dementia in mother and father.  At this time continue collaboration with neurology as needed and current Aricept  dosing - they do not wish to increase to 10 MG due to headaches in past with starting medication.  Continue B12 supplement.

## 2023-12-31 NOTE — Assessment & Plan Note (Signed)
 Chronic, continue collaboration with pain management and neurosurgery.  Recent notes reviewed.

## 2023-12-31 NOTE — Progress Notes (Signed)
 BP 129/72 (BP Location: Left Arm, Cuff Size: Normal)   Pulse 60   Temp (!) 97.5 F (36.4 C) (Oral)   Ht 6' 2 (1.88 m)   Wt 219 lb 3.2 oz (99.4 kg)   SpO2 97%   BMI 28.14 kg/m    Subjective:    Patient ID: Christopher Huang, male    DOB: [birth year], 73 y.o.   MRN: [id number]  HPI: Christopher Huang is a 73 y.o. male  Chief Complaint  Patient presents with   Dementia   Hypertension   Hyperlipidemia   Congestive Heart Failure   HYPERTENSION / HYPERLIPIDEMIA/HF Continues on Carvedilol , Rosuvastatin , Jardiance , and Entresto .  Last saw cardiology 07/12/23.  Satisfied with current treatment? yes Duration of hypertension: chronic BP monitoring frequency: rarely BP range:  BP medication side effects: no Duration of hyperlipidemia: chronic Cholesterol medication side effects: no Cholesterol supplements: none  Medication compliance: good compliance Aspirin : yes Recent stressors: no Recurrent headaches: no Visual changes: no Palpitations: no Dyspnea: no Chest pain: no Lower extremity edema: no Dizzy/lightheaded: no   DEMENTIA: Initially noted 03/27/22. Imaging done 04/15/22 noting asymmetric left temporal lobe atrophy, ?semantic dementia. Saw neurology on 05/05/22, Dr. Rush, to continue on Aricept . Some days he appears a little more confused per his wife. He recently had dental work done and has lost some weight with this. Has history of back surgery in March 2022.  He is seeing pain clinic, last 07/22/23 with injection.  Had ablation of nerves. Going to neurosurgery, 12/02/23 -- currently doing PT.  Both of his parents had mild dementia, both lived into upper 30's.  His wife and him report no recent worsening in memory.      12/31/2023    9:58 AM 03/15/2023    1:48 PM 03/27/2022    8:22 AM 02/07/2021    3:23 PM 01/26/2019   10:03 AM  6CIT Screen  What Year? 0 points 0 points 0 points 0 points 0 points  What month? 3 points 3 points 0 points 0 points 0 points  What time? 0 points 3  points 0 points 0 points 0 points  Count back from 20 0 points 0 points 0 points 0 points 0 points  Months in reverse 4 points 4 points 4 points 2 points 0 points  Repeat phrase 10 points 10 points 10 points 0 points 0 points  Total Score 17 points 20 points 14 points 2 points 0 points      12/31/2023    9:27 AM 09/30/2023    9:00 AM 03/30/2023    8:42 AM 03/15/2023    1:42 PM 01/25/2023    1:50 PM  Depression screen PHQ 2/9  Decreased Interest 0 1 0 0 0  Down, Depressed, Hopeless 0 0 0 0 0  PHQ - 2 Score 0 1 0 0 0  Altered sleeping 0 0 0 0   Tired, decreased energy 0 1 0 0   Change in appetite 0 0 0 0   Feeling bad or failure about yourself  0 0 0 0   Trouble concentrating 0 0 0 0   Moving slowly or fidgety/restless 0 0 0 0   Suicidal thoughts 0 0 0 0   PHQ-9 Score 0 2 0 0   Difficult doing work/chores Not difficult at all Not difficult at all Not difficult at all Not difficult at all       12/31/2023    9:27 AM 09/30/2023    9:00 AM 03/30/2023  8:42 AM 10/28/2022    8:27 AM  GAD 7 : Generalized Anxiety Score  Nervous, Anxious, on Edge 0 0 0 0  Control/stop worrying 0 0 0 0  Worry too much - different things 0 0 0 1  Trouble relaxing 0 0 0 0  Restless 0 0 0 0  Easily annoyed or irritable 0 0 0 0  Afraid - awful might happen 0 0 0 0  Total GAD 7 Score 0 0 0 1  Anxiety Difficulty Not difficult at all Not difficult at all Not difficult at all Somewhat difficult   Relevant past medical, surgical, family and social history reviewed and updated as indicated. Interim medical history since our last visit reviewed. Allergies and medications reviewed and updated.  Review of Systems  Constitutional:  Negative for activity change, appetite change, diaphoresis, fatigue and fever.  Respiratory:  Negative for cough, chest tightness, shortness of breath and wheezing.   Cardiovascular:  Negative for chest pain, palpitations and leg swelling.  Gastrointestinal: Negative.   Endocrine: Negative.    Neurological:  Negative for dizziness, syncope, weakness, light-headedness, numbness and headaches.  Psychiatric/Behavioral: Negative.  Negative for decreased concentration, self-injury, sleep disturbance and suicidal ideas. The patient is not nervous/anxious.    Per HPI unless specifically indicated above     Objective:    BP 129/72 (BP Location: Left Arm, Cuff Size: Normal)   Pulse 60   Temp (!) 97.5 F (36.4 C) (Oral)   Ht 6' 2 (1.88 m)   Wt 219 lb 3.2 oz (99.4 kg)   SpO2 97%   BMI 28.14 kg/m   Wt Readings from Last 3 Encounters:  12/31/23 219 lb 3.2 oz (99.4 kg)  12/02/23 224 lb (101.6 kg)  09/30/23 224 lb (101.6 kg)    Physical Exam Vitals and nursing note reviewed.  Constitutional:      General: He is awake. He is not in acute distress.    Appearance: Normal appearance. He is well-developed and well-groomed. He is not ill-appearing or toxic-appearing.  HENT:     Head: Normocephalic and atraumatic.     Right Ear: Hearing and external ear normal. No drainage.     Left Ear: Hearing and external ear normal. No drainage.  Eyes:     General: Lids are normal.        Right eye: No discharge.        Left eye: No discharge.     Conjunctiva/sclera: Conjunctivae normal.     Pupils: Pupils are equal, round, and reactive to light.  Neck:     Thyroid : No thyromegaly.     Vascular: No carotid bruit.  Cardiovascular:     Rate and Rhythm: Normal rate and regular rhythm.     Heart sounds: Normal heart sounds, S1 normal and S2 normal. No murmur heard.    No gallop.  Pulmonary:     Effort: Pulmonary effort is normal. No accessory muscle usage or respiratory distress.     Breath sounds: Normal breath sounds. No decreased breath sounds, wheezing or rhonchi.  Abdominal:     General: Abdomen is flat. Bowel sounds are normal. There is no distension.     Palpations: Abdomen is soft. There is no hepatomegaly or splenomegaly.     Tenderness: There is no abdominal tenderness.   Musculoskeletal:        General: Normal range of motion.     Cervical back: Normal range of motion and neck supple.     Right lower leg: No  edema.     Left lower leg: No edema.  Lymphadenopathy:     Cervical: No cervical adenopathy.  Skin:    General: Skin is warm and dry.     Capillary Refill: Capillary refill takes less than 2 seconds.  Neurological:     Mental Status: He is alert and oriented to person, place, and time.     Deep Tendon Reflexes: Reflexes are normal and symmetric.     Comments: Oriented to year, but not month or day of week.  Able to state President. Able to report location and provider name.  Psychiatric:        Attention and Perception: Attention normal.        Mood and Affect: Mood normal.        Speech: Speech normal.        Behavior: Behavior normal. Behavior is cooperative.        Thought Content: Thought content normal.    Results for orders placed or performed in visit on 09/30/23  Comprehensive metabolic panel   Collection Time: 09/30/23  9:50 AM  Result Value Ref Range   Glucose 88 70 - 99 mg/dL   BUN 12 8 - 27 mg/dL   Creatinine, Ser 9.08 0.76 - 1.27 mg/dL   eGFR 90 >40 fO/fpw/8.26   BUN/Creatinine Ratio 13 10 - 24   Sodium 142 134 - 144 mmol/L   Potassium 4.7 3.5 - 5.2 mmol/L   Chloride 103 96 - 106 mmol/L   CO2 25 20 - 29 mmol/L   Calcium  9.1 8.6 - 10.2 mg/dL   Total Protein 6.3 6.0 - 8.5 g/dL   Albumin 4.4 3.8 - 4.8 g/dL   Globulin, Total 1.9 1.5 - 4.5 g/dL   Bilirubin Total 0.5 0.0 - 1.2 mg/dL   Alkaline Phosphatase 100 44 - 121 IU/L   AST 19 0 - 40 IU/L   ALT 16 0 - 44 IU/L  Lipid Panel w/o Chol/HDL Ratio   Collection Time: 09/30/23  9:50 AM  Result Value Ref Range   Cholesterol, Total 135 100 - 199 mg/dL   Triglycerides 771 (H) 0 - 149 mg/dL   HDL 50 >60 mg/dL   VLDL Cholesterol Cal 36 5 - 40 mg/dL   LDL Chol Calc (NIH) 49 0 - 99 mg/dL      Assessment & Plan:   Problem List Items Addressed This Visit       Cardiovascular  and Mediastinum   Heart failure with reduced ejection fraction (HCC)   Chronic.  Diagnosed on 06/30/22 with EF 30-35%, recent echo August 2024 similar.  Followed by cardiology.  Euvolemic.  They have grant to assist in covering Entresto  and Jardiance . Recommend: - Reminded to call for an overnight weight gain of >2 pounds or a weekly weight gain of >5 pounds - not adding salt to food and read food labels. Reviewed the importance of keeping daily sodium intake to 2000mg  daily.  - No NSAIDS      Relevant Medications   rosuvastatin  (CRESTOR ) 40 MG tablet   Hypertension   Chronic, ongoing.  BP at goal today.  Continue current medication regimen and collaboration with cardiology.  Recommend he monitor BP at least a few mornings a week at home and document.  DASH diet at home.  Labs today: up to date.        Relevant Medications   rosuvastatin  (CRESTOR ) 40 MG tablet     Nervous and Auditory   Frontotemporal dementia (HCC) - Primary  Chronic, progressive.  Diagnosed 04/15/22, remains stable at this time.  Family history of dementia in mother and father.  At this time continue collaboration with neurology as needed and current Aricept  dosing - they do not wish to increase to 10 MG due to headaches in past with starting medication.  Continue B12 supplement.      Relevant Medications   donepezil  (ARICEPT ) 5 MG tablet     Other   Hyperlipidemia   Chronic, ongoing.  Continue Rosuvastatin  daily.  Adjust dose as needed.  Obtain labs today: up to date.      Relevant Medications   rosuvastatin  (CRESTOR ) 40 MG tablet   Spinal stenosis, lumbar region, without neurogenic claudication (L3/4)   Chronic, continue collaboration with pain management and neurosurgery.  Recent notes reviewed.         Follow up plan: Return in about 3 months (around 03/29/2024) for DEMENTIA, HTN/HLD/HF.

## 2023-12-31 NOTE — Assessment & Plan Note (Signed)
 Chronic.  Diagnosed on 06/30/22 with EF 30-35%, recent echo August 2024 similar.  Followed by cardiology.  Euvolemic.  They have grant to assist in covering Entresto  and Jardiance . Recommend: - Reminded to call for an overnight weight gain of >2 pounds or a weekly weight gain of >5 pounds - not adding salt to food and read food labels. Reviewed the importance of keeping daily sodium intake to 2000mg  daily.  - No NSAIDS

## 2024-01-04 ENCOUNTER — Ambulatory Visit: Payer: PPO

## 2024-01-04 DIAGNOSIS — M5459 Other low back pain: Secondary | ICD-10-CM | POA: Diagnosis not present

## 2024-01-04 DIAGNOSIS — M6281 Muscle weakness (generalized): Secondary | ICD-10-CM

## 2024-01-04 DIAGNOSIS — R293 Abnormal posture: Secondary | ICD-10-CM

## 2024-01-04 DIAGNOSIS — R262 Difficulty in walking, not elsewhere classified: Secondary | ICD-10-CM

## 2024-01-04 NOTE — Therapy (Signed)
OUTPATIENT PHYSICAL THERAPY THORACOLUMBAR TREATMENT   Patient Name: Christopher Huang MRN: [id number] DOB:1950-12-03, 73 y.o., male Today's Date: 01/04/2024  END OF SESSION:  PT End of Session - 01/04/24 0906     Visit Number 7    Number of Visits 17    Date for PT Re-Evaluation 01/31/24    PT Start Time 0901    PT Stop Time 0945    PT Time Calculation (min) 44 min    Activity Tolerance Patient tolerated treatment well    Behavior During Therapy [facility] for tasks assessed/performed              Past Medical History:  Diagnosis Date   Allergic rhinitis    Cancer of prostate (HCC) 02/2007   s/p surgery   Dementia (HCC)    History of kidney stones    H/O   Hyperlipidemia    Hypertension    Personal history of kidney stones    Past Surgical History:  Procedure Laterality Date   ANTERIOR LATERAL LUMBAR FUSION WITH PERCUTANEOUS SCREW 1 LEVEL N/A 01/29/2021   Procedure: L4-5 LATERAL INTERBODY FUSION, L4-5 POSTERIOR FUSION;  Surgeon: Venetia Night, MD;  Location: [facility] ORS;  Service: Neurosurgery;  Laterality: N/A;   basal skin cancers     CATARACT EXTRACTION, BILATERAL     2021   COLONOSCOPY WITH PROPOFOL N/A 11/09/2017   Procedure: COLONOSCOPY WITH PROPOFOL;  Surgeon: Midge Minium, MD;  Location: [facility] ENDOSCOPY;  Service: Endoscopy;  Laterality: N/A;   COLONOSCOPY WITH PROPOFOL N/A 12/01/2022   Procedure: COLONOSCOPY WITH PROPOFOL;  Surgeon: Midge Minium, MD;  Location: [facility] ENDOSCOPY;  Service: Endoscopy;  Laterality: N/A;   EYE SURGERY Bilateral    Cataract   HAND SURGERY     THUMB SURGERY   KNEE ARTHROSCOPY     prostate cancer removed     TONSILLECTOMY     TONSILLECTOMY     TOTAL KNEE ARTHROPLASTY Left 02/09/2020   Procedure: TOTAL KNEE ARTHROPLASTY - RNFA;  Surgeon: Kennedy Bucker, MD;  Location: [facility] ORS;  Service: Orthopedics;  Laterality: Left;   Patient Active Problem List   Diagnosis Date Noted   Failed back surgical syndrome 03/22/2023   Lumbar facet  arthropathy 12/07/2022   Spinal stenosis, lumbar region, without neurogenic claudication (L3/4) 12/07/2022   Lumbar degenerative disc disease 12/07/2022   Chronic knee pain after total replacement of left knee joint 12/07/2022   Polyp of ascending colon 12/01/2022   Heart failure with reduced ejection fraction (HCC) 10/25/2022   LBBB (left bundle branch block) 05/24/2022   Frontotemporal dementia (HCC) 03/27/2022   Vitamin D deficiency 09/26/2021   History of back surgery 03/07/2021   Knee joint replacement status, left 02/09/2020   Advanced care planning/counseling discussion 02/02/2019   Arthritis of hand 11/25/2018   History of colonic polyps    Allergic rhinitis    History of prostate cancer    Hyperlipidemia    Hypertension    Personal history of kidney stones     PCP: Aura Dials, T NP  REFERRING PROVIDER: Drake Leach, PA-C  REFERRING DIAG: Z98.1 (ICD-10-CM) - S/P lumbar fusionM47.816 (ICD-10-CM) - Lumbar spondylosisM48.061 (ICD-10-CM) - Spinal stenosis of lumbar region, unspecified whether neurogenic claudication presentM43.16 (ICD-10-CM) - Spondylolisthesis of lumbar regionM51.360 (ICD-10-CM) - Degeneration of intervertebral disc of lumbar region with discogenic back pain  Rationale for Evaluation and Treatment: Rehabilitation  THERAPY DIAG:  Other low back pain  Abnormal posture  Difficulty in walking, not elsewhere classified  Muscle weakness (generalized)  ONSET  DATE: Chronic. Has had it for many years.  SUBJECTIVE:                                                                                                                                                                                           SUBJECTIVE STATEMENT: Pt reports no pain currently no falls. Still has some pain with walking.   PERTINENT HISTORY:  Pt's spouse assisting in subjective. Has had prior lumbar surgery. Sounds like he has had adjacent lumbar changes above his hardware and spouse  states they (MD's) believe this is his source of pain. Spouse reports poor endurance for standing and walking. Pt and spouse state his walking tolerance is only 2-3 minutes. Severe LBP is what is limiting him from progressing, not his LE strength denying neurogenic claudication symptoms. Pain is central, and lower. Reports stabbing pain with bending over, standing, walking. Denies use of a SPC or RW to assist in balance/endurance. Worst pain described worst pain as a 5/10 NPS with aggravating activities. Best pain around 1/10 NPS. Does endorse stiffness and acheiness with weather changes and mornings. Reports forward leaning with walking. Does wear back brace regularly which he states helps his pain.   PAIN:  Are you having pain? Yes: NPRS scale: 0/10 NPS Pain location: Central, lower back Pain description: sharp, stiff, achey Aggravating factors: activity, standing, walking, bending Relieving factors: rest, sitting, back brace  PRECAUTIONS: None  RED FLAGS: Bowel or bladder incontinence: No and Cauda equina syndrome: No   WEIGHT BEARING RESTRICTIONS: No  FALLS:  Has patient fallen in last 6 months? No  LIVING ENVIRONMENT: Lives with: lives with their spouse Lives in: House/apartment Stairs: Yes: External: 3-4 steps; bilateral but cannot reach both Has following equipment at home: None  OCCUPATION: Retired   PLOF: Independent  PATIENT GOALS: To improve his pain and endurance.   NEXT MD VISIT: N/A  OBJECTIVE:  Note: Objective measures were completed at Evaluation unless otherwise noted.  DIAGNOSTIC FINDINGS:  Lumbar MRI dated 10/13/23:  FINDINGS: Segmentation: Transitional lumbosacral anatomy with a partially lumbarized S1 segment, as before. Utilizing this numbering, the patient has undergone previous PLIF at L4-5.   Alignment: The alignment is stable with a mild scoliosis, a grade 1 retrolisthesis at L2-3 and a grade 1 anterolisthesis at L4-5 and L5-S1.   Vertebrae: No  worrisome osseous lesion, acute fracture or pars defect. Stable L3 hemangioma. Stable postsurgical changes from L4-5 PLIF.   Conus medullaris: Extends to the T12-L1 level and appears normal.   Paraspinal and other soft tissues: No significant paraspinal findings.   Disc levels:   Sagittal images demonstrate no significant disc  space findings within the visualized lower thoracic spine.   L1-2: Only imaged in the sagittal plane. Preserved disc height with mild bilateral facet hypertrophy. No significant spinal stenosis or nerve root encroachment.   L2-3: Stable chronic loss of disc height with annular disc bulging and endplate osteophytes asymmetric to the right. Moderate facet and ligamentous hypertrophy. Unchanged mild multifactorial spinal stenosis with asymmetric narrowing of the right lateral recess, moderate right and mild left foraminal narrowing.   L3-4: Mild loss of disc height with annular disc bulging and endplate osteophytes asymmetric to the right. There advanced facet and ligamentous hypertrophy contributing to stable moderate to severe multifactorial spinal stenosis. Mild lateral recess and foraminal narrowing, right greater than left, is unchanged.   L4-5: Previous posterior decompression and PLIF. The spinal canal and neural foramina are widely patent.   L5-S1: Chronic mild loss of disc height with annular disc bulging and endplate osteophytes asymmetric to the left. Mild right and moderate left foraminal narrowing. The spinal canal, lateral recesses and right foramen remain widely patent. There is chronic moderate left foraminal narrowing.   S1-2: Transitional disc space level appears stable, without spinal stenosis or nerve root encroachment.   IMPRESSION: 1. No acute findings or significant changes compared with previous study from 11 months prior. 2. Stable mild multifactorial spinal stenosis at L2-3 with asymmetric narrowing of the right lateral  recess, moderate right and mild left foraminal narrowing. 3. Stable moderate to severe multifactorial spinal stenosis at L3-4 with mild lateral recess and foraminal narrowing, right greater than left. 4. Stable postsurgical changes at L4-5 without residual or recurrent spinal stenosis. 5. Stable moderate chronic left foraminal narrowing at L5-S1.     Electronically Signed   By: Carey Bullocks M.D.   On: 10/31/2023 17:23  PATIENT SURVEYS:  FOTO 49/58  COGNITION: Overall cognitive status: History of cognitive impairments - at baseline     SENSATION: WFL  MUSCLE LENGTH: Hamstrings: Right 60 deg; Left 60 deg Thomas test: Positive specifically for rectus femoris tightness bilaterally  POSTURE: decreased lumbar lordosis, increased thoracic kyphosis, and hinging at hip flexors in standing  PALPATION: TTP at L1-L3 at spinous process and L sided paraspinals  LUMBAR ROM:   AROM eval  Flexion 75%  Extension 25%*  Right lateral flexion 50%  Left lateral flexion 50%*  Right rotation 100%  Left rotation 100%   (Blank rows = not tested)  LOWER EXTREMITY ROM:     Active  Right eval Left eval  Hip flexion    Hip extension    Hip abduction    Hip adduction    Hip internal rotation    Hip external rotation    Knee flexion    Knee extension    Ankle dorsiflexion    Ankle plantarflexion    Ankle inversion    Ankle eversion     (Blank rows = not tested)  LOWER EXTREMITY MMT:    MMT Right eval Left eval  Hip flexion 5 5  Hip extension 3 2  Hip abduction 3 3  Hip adduction    Hip internal rotation 4 4  Hip external rotation 5 5  Knee flexion 5 5  Knee extension 5 5  Ankle dorsiflexion 5 5  Ankle plantarflexion 5 4  Ankle inversion    Ankle eversion     (Blank rows = not tested)  LUMBAR SPECIAL TESTS:  Straight leg raise test: Negative, Slump test: Negative, and FABER test: Negative  FADDIR: Negative   FUNCTIONAL TESTS:  5 times sit to stand: 14.81 sec 2  minute walk test: 360'  GAIT: Distance walked: 300' Assistive device utilized: None Level of assistance: Complete Independence Comments: Crouched gait, decreased R/L foot clearance  TREATMENT DATE: 01/04/24  There.ex: Nu-Step L5 for 5 min. BUE and LE use for lumbar warm up. Not billed.  STS with med ball tosses outside BOS. Mat table behind pt for safety. 2x8 with 3 KG med ball.   Dead bugs: 5x30 sec holds   There.Act:  Alternating step ups with contralateral hip flexion on 6" step for SLS and hip flexor/knee flexor/ ankle DF strength and foot clearance. 2x12/LE.  D2 flexion pattern picking ball up off the floor to overhead raise in D2 flexion. 3 KG med ball, 2x8/side. Mod to max multi modal cuing.   1x200' resisted gait with 4# AW's donned, CGA. Mod VC's for foot clearance, upright posture.    With 4# AW's donned. 6 hurdles, x2 steps step-to pattern. X6 laps step through pattern CGA. Intermittent LOB needing stepping strategy to correct.    PATIENT EDUCATION:  Education details: HEP, POC Person educated: Patient and Spouse Education method: Explanation, Demonstration, and Handouts Education comprehension: verbalized understanding and needs further education  HOME EXERCISE PROGRAM: Access Code: [email or web address] URL: [email or web address] Date: 12/29/2023 Prepared by: Ronnie Derby  Exercises - Isometric Dead Bug  - 1 x daily - 3-4 x weekly - 1 sets - 5 reps - 15 hold - Sit to Stand Without Arm Support  - 1 x daily - 3-4 x weekly - 3 sets - 12 reps - Standing Hip Abduction with Counter Support  - 1 x daily - 3-4 x weekly - 3 sets - 12 reps - Prone Quad Stretch with Towel Roll and Strap  - 1 x daily - 7 x weekly - 1 sets - 3 reps - 30 hold - Seated Hamstring Stretch  - 1 x daily - 7 x weekly - 1 sets - 2 reps - 30 hold - Scapular Retraction with Resistance  - 1 x daily - 7 x weekly - 3 sets - 15 reps  Access Code: [email or web address] URL:  [email or web address] Date: 12/20/2023 Prepared by: Ronnie Derby  Exercises - Isometric Dead Bug  - 1 x daily - 3-4 x weekly - 1 sets - 5 reps - 15 hold - Sit to Stand Without Arm Support  - 1 x daily - 3-4 x weekly - 3 sets - 12 reps - Standing Hip Abduction with Counter Support  - 1 x daily - 3-4 x weekly - 3 sets - 12 reps - Prone Quad Stretch with Towel Roll and Strap  - 1 x daily - 7 x weekly - 1 sets - 3 reps - 30 hold - Seated Hamstring Stretch  - 1 x daily - 7 x weekly - 1 sets - 2 reps - 30 hold   Access Code: [email or web address] URL: [email or web address] Date: 12/06/2023 Prepared by: Ronnie Derby  Exercises - Sit to Stand Without Arm Support  - 1 x daily - 3-4 x weekly - 3 sets - 12 reps - Standing Hip Abduction with Counter Support  - 1 x daily - 3-4 x weekly - 3 sets - 12 reps - Seated Hamstring Stretch  - 1 x daily - 7 x weekly - 1 sets - 2 reps - 30 hold  ASSESSMENT:  CLINICAL IMPRESSION: Continuing PT POC with focus on hip strength, progression in core stability exercises and dynamic balance. More emphasis placed on compound exercises for SLS,  foot clearance exercises with difficulty coordinating multi step tasks. Able to complete with visual targets more successfully versus multi modal cuing. Pt remains with postural abnormality, gait deficits and LE/core weakness. Pt will benefit from skilled PT services to address these impairments and maximize independence with functional mobility.  OBJECTIVE IMPAIRMENTS: Abnormal gait, decreased activity tolerance, decreased balance, decreased endurance, decreased mobility, difficulty walking, decreased ROM, decreased strength, impaired flexibility, postural dysfunction, and pain.   ACTIVITY LIMITATIONS: carrying, lifting, bending, standing, squatting, stairs, and locomotion level  PARTICIPATION LIMITATIONS: community activity  PERSONAL FACTORS: Age, Past/current experiences, Time since onset of  injury/illness/exacerbation, and 3+ comorbidities: Dementia, hx of prostate cancer, HLD, HTN   are also affecting patient's functional outcome.   REHAB POTENTIAL: Fair Chronicity of symptoms  CLINICAL DECISION MAKING: Evolving/moderate complexity  EVALUATION COMPLEXITY: Moderate   GOALS: Goals reviewed with patient? No  SHORT TERM GOALS: Target date: 01/03/24  Pt will be independent with HEP to improve lumbar ROM, LE strength, and endurance to improve functional capacity.  Baseline: 12/06/23: provided HEP Goal status: INITIAL  LONG TERM GOALS: Target date: 01/31/24  Pt will improve FOTO to target score to demonstrate clinically significant improvement in functional mobility.  Baseline: 12/06/23: Deferred to next session; 12/08/23: 49/58 Goal status: INITIAL  2.  Pt will improve 5xSTS by at least 2.3 seconds to improve LE strength for completing standing and walking ADL's.  Baseline: 12/06/23: 14.81 seconds Goal status: INITIAL  3.  Pt will improve 6 MWT by at least 145' to demonstrate clinically significant improvement in completing community walking distances.  Baseline: 12/06/23; Completed 2 MWT at 361'.; 12/08/23: 952'. Goal status: INITIAL  PLAN:  PT FREQUENCY: 1-2x/week  PT DURATION: 8 weeks  PLANNED INTERVENTIONS: 97164- PT Re-evaluation, 97110-Therapeutic exercises, 97530- Therapeutic activity, 97112- Neuromuscular re-education, 97535- Self Care, 52841- Manual therapy, [phone]- Gait training, 97014- Electrical stimulation (unattended), [phone]- Electrical stimulation (manual), Patient/Family education, Balance training, Stair training, Joint mobilization, Joint manipulation, Spinal manipulation, Spinal mobilization, Cryotherapy, and Moist heat.  PLAN FOR NEXT SESSION: single limb strength training, hip and core strengthening. Hip flexor strengthening for foot clearance.    Delphia Grates. Fairly IV, PT, DPT Physical Therapist- [place]  [facility]   01/04/2024, 9:53 AM

## 2024-01-07 ENCOUNTER — Ambulatory Visit: Payer: PPO

## 2024-01-07 DIAGNOSIS — M5459 Other low back pain: Secondary | ICD-10-CM | POA: Diagnosis not present

## 2024-01-07 DIAGNOSIS — R293 Abnormal posture: Secondary | ICD-10-CM

## 2024-01-07 DIAGNOSIS — M6281 Muscle weakness (generalized): Secondary | ICD-10-CM

## 2024-01-07 DIAGNOSIS — R262 Difficulty in walking, not elsewhere classified: Secondary | ICD-10-CM

## 2024-01-07 NOTE — Therapy (Signed)
OUTPATIENT PHYSICAL THERAPY THORACOLUMBAR TREATMENT   Patient Name: Christopher Huang MRN: [id number] DOB:[birth year], 73 y.o., male Today's Date: 01/07/2024  END OF SESSION:  PT End of Session - 01/07/24 0816     Visit Number 8    Number of Visits 17    Date for PT Re-Evaluation 01/31/24    PT Start Time 0815    PT Stop Time 0900    PT Time Calculation (min) 45 min    Activity Tolerance Patient tolerated treatment well    Behavior During Therapy [facility] for tasks assessed/performed              Past Medical History:  Diagnosis Date   Allergic rhinitis    Cancer of prostate (HCC) 02/2007   s/p surgery   Dementia (HCC)    History of kidney stones    H/O   Hyperlipidemia    Hypertension    Personal history of kidney stones    Past Surgical History:  Procedure Laterality Date   ANTERIOR LATERAL LUMBAR FUSION WITH PERCUTANEOUS SCREW 1 LEVEL N/A 01/29/2021   Procedure: L4-5 LATERAL INTERBODY FUSION, L4-5 POSTERIOR FUSION;  Surgeon: Venetia Night, MD;  Location: [facility] ORS;  Service: Neurosurgery;  Laterality: N/A;   basal skin cancers     CATARACT EXTRACTION, BILATERAL     2021   COLONOSCOPY WITH PROPOFOL N/A 11/09/2017   Procedure: COLONOSCOPY WITH PROPOFOL;  Surgeon: Midge Minium, MD;  Location: [facility] ENDOSCOPY;  Service: Endoscopy;  Laterality: N/A;   COLONOSCOPY WITH PROPOFOL N/A 12/01/2022   Procedure: COLONOSCOPY WITH PROPOFOL;  Surgeon: Midge Minium, MD;  Location: [facility] ENDOSCOPY;  Service: Endoscopy;  Laterality: N/A;   EYE SURGERY Bilateral    Cataract   HAND SURGERY     THUMB SURGERY   KNEE ARTHROSCOPY     prostate cancer removed     TONSILLECTOMY     TONSILLECTOMY     TOTAL KNEE ARTHROPLASTY Left 02/09/2020   Procedure: TOTAL KNEE ARTHROPLASTY - RNFA;  Surgeon: Kennedy Bucker, MD;  Location: [facility] ORS;  Service: Orthopedics;  Laterality: Left;   Patient Active Problem List   Diagnosis Date Noted   Failed back surgical syndrome 03/22/2023   Lumbar facet  arthropathy 12/07/2022   Spinal stenosis, lumbar region, without neurogenic claudication (L3/4) 12/07/2022   Lumbar degenerative disc disease 12/07/2022   Chronic knee pain after total replacement of left knee joint 12/07/2022   Polyp of ascending colon 12/01/2022   Heart failure with reduced ejection fraction (HCC) 10/25/2022   LBBB (left bundle branch block) 05/24/2022   Frontotemporal dementia (HCC) 03/27/2022   Vitamin D deficiency 09/26/2021   History of back surgery 03/07/2021   Knee joint replacement status, left 02/09/2020   Advanced care planning/counseling discussion 02/02/2019   Arthritis of hand 11/25/2018   History of colonic polyps    Allergic rhinitis    History of prostate cancer    Hyperlipidemia    Hypertension    Personal history of kidney stones     PCP: Aura Dials, T NP  REFERRING PROVIDER: Drake Leach, PA-C  REFERRING DIAG: Z98.1 (ICD-10-CM) - S/P lumbar fusionM47.816 (ICD-10-CM) - Lumbar spondylosisM48.061 (ICD-10-CM) - Spinal stenosis of lumbar region, unspecified whether neurogenic claudication presentM43.16 (ICD-10-CM) - Spondylolisthesis of lumbar regionM51.360 (ICD-10-CM) - Degeneration of intervertebral disc of lumbar region with discogenic back pain  Rationale for Evaluation and Treatment: Rehabilitation  THERAPY DIAG:  Other low back pain  Abnormal posture  Difficulty in walking, not elsewhere classified  Muscle weakness (generalized)  ONSET  DATE: Chronic. Has had it for many years.  SUBJECTIVE:                                                                                                                                                                                           SUBJECTIVE STATEMENT: Pt reports he feels like he is getting close to discharge. Feels like he is doing a lot better. Spouse in agreement he is doing very well.   PERTINENT HISTORY:  Pt's spouse assisting in subjective. Has had prior lumbar surgery. Sounds like  he has had adjacent lumbar changes above his hardware and spouse states they (MD's) believe this is his source of pain. Spouse reports poor endurance for standing and walking. Pt and spouse state his walking tolerance is only 2-3 minutes. Severe LBP is what is limiting him from progressing, not his LE strength denying neurogenic claudication symptoms. Pain is central, and lower. Reports stabbing pain with bending over, standing, walking. Denies use of a SPC or RW to assist in balance/endurance. Worst pain described worst pain as a 5/10 NPS with aggravating activities. Best pain around 1/10 NPS. Does endorse stiffness and acheiness with weather changes and mornings. Reports forward leaning with walking. Does wear back brace regularly which he states helps his pain.   PAIN:  Are you having pain? Yes: NPRS scale: 0/10 NPS Pain location: Central, lower back Pain description: sharp, stiff, achey Aggravating factors: activity, standing, walking, bending Relieving factors: rest, sitting, back brace  PRECAUTIONS: None  RED FLAGS: Bowel or bladder incontinence: No and Cauda equina syndrome: No   WEIGHT BEARING RESTRICTIONS: No  FALLS:  Has patient fallen in last 6 months? No  LIVING ENVIRONMENT: Lives with: lives with their spouse Lives in: House/apartment Stairs: Yes: External: 3-4 steps; bilateral but cannot reach both Has following equipment at home: None  OCCUPATION: Retired   PLOF: Independent  PATIENT GOALS: To improve his pain and endurance.   NEXT MD VISIT: N/A  OBJECTIVE:  Note: Objective measures were completed at Evaluation unless otherwise noted.  DIAGNOSTIC FINDINGS:  Lumbar MRI dated 10/13/23:  FINDINGS: Segmentation: Transitional lumbosacral anatomy with a partially lumbarized S1 segment, as before. Utilizing this numbering, the patient has undergone previous PLIF at L4-5.   Alignment: The alignment is stable with a mild scoliosis, a grade 1 retrolisthesis at L2-3  and a grade 1 anterolisthesis at L4-5 and L5-S1.   Vertebrae: No worrisome osseous lesion, acute fracture or pars defect. Stable L3 hemangioma. Stable postsurgical changes from L4-5 PLIF.   Conus medullaris: Extends to the T12-L1 level and appears normal.   Paraspinal and other soft tissues: No significant  paraspinal findings.   Disc levels:   Sagittal images demonstrate no significant disc space findings within the visualized lower thoracic spine.   L1-2: Only imaged in the sagittal plane. Preserved disc height with mild bilateral facet hypertrophy. No significant spinal stenosis or nerve root encroachment.   L2-3: Stable chronic loss of disc height with annular disc bulging and endplate osteophytes asymmetric to the right. Moderate facet and ligamentous hypertrophy. Unchanged mild multifactorial spinal stenosis with asymmetric narrowing of the right lateral recess, moderate right and mild left foraminal narrowing.   L3-4: Mild loss of disc height with annular disc bulging and endplate osteophytes asymmetric to the right. There advanced facet and ligamentous hypertrophy contributing to stable moderate to severe multifactorial spinal stenosis. Mild lateral recess and foraminal narrowing, right greater than left, is unchanged.   L4-5: Previous posterior decompression and PLIF. The spinal canal and neural foramina are widely patent.   L5-S1: Chronic mild loss of disc height with annular disc bulging and endplate osteophytes asymmetric to the left. Mild right and moderate left foraminal narrowing. The spinal canal, lateral recesses and right foramen remain widely patent. There is chronic moderate left foraminal narrowing.   S1-2: Transitional disc space level appears stable, without spinal stenosis or nerve root encroachment.   IMPRESSION: 1. No acute findings or significant changes compared with previous study from 11 months prior. 2. Stable mild multifactorial spinal  stenosis at L2-3 with asymmetric narrowing of the right lateral recess, moderate right and mild left foraminal narrowing. 3. Stable moderate to severe multifactorial spinal stenosis at L3-4 with mild lateral recess and foraminal narrowing, right greater than left. 4. Stable postsurgical changes at L4-5 without residual or recurrent spinal stenosis. 5. Stable moderate chronic left foraminal narrowing at L5-S1.     Electronically Signed   By: Carey Bullocks M.D.   On: 10/31/2023 17:23  PATIENT SURVEYS:  FOTO 49/58  COGNITION: Overall cognitive status: History of cognitive impairments - at baseline     SENSATION: WFL  MUSCLE LENGTH: Hamstrings: Right 60 deg; Left 60 deg Thomas test: Positive specifically for rectus femoris tightness bilaterally  POSTURE: decreased lumbar lordosis, increased thoracic kyphosis, and hinging at hip flexors in standing  PALPATION: TTP at L1-L3 at spinous process and L sided paraspinals  LUMBAR ROM:   AROM eval  Flexion 75%  Extension 25%*  Right lateral flexion 50%  Left lateral flexion 50%*  Right rotation 100%  Left rotation 100%   (Blank rows = not tested)  LOWER EXTREMITY ROM:     Active  Right eval Left eval  Hip flexion    Hip extension    Hip abduction    Hip adduction    Hip internal rotation    Hip external rotation    Knee flexion    Knee extension    Ankle dorsiflexion    Ankle plantarflexion    Ankle inversion    Ankle eversion     (Blank rows = not tested)  LOWER EXTREMITY MMT:    MMT Right eval Left eval  Hip flexion 5 5  Hip extension 3 2  Hip abduction 3 3  Hip adduction    Hip internal rotation 4 4  Hip external rotation 5 5  Knee flexion 5 5  Knee extension 5 5  Ankle dorsiflexion 5 5  Ankle plantarflexion 5 4  Ankle inversion    Ankle eversion     (Blank rows = not tested)  LUMBAR SPECIAL TESTS:  Straight leg raise test: Negative,  Slump test: Negative, and FABER test: Negative  FADDIR:  Negative   FUNCTIONAL TESTS:  5 times sit to stand: 14.81 sec 2 minute walk test: 360'  GAIT: Distance walked: 300' Assistive device utilized: None Level of assistance: Complete Independence Comments: Crouched gait, decreased R/L foot clearance  TREATMENT DATE: 01/07/24  There.ex: Nu-Step L6 for 5 min. BUE and LE use for lumbar warm up. Not billed.  STS with overhead press with 10# KB.  3x12  Dead bugs: 5x30 sec holds   There.Act:  Sled pushes with 90#, 4x 10 meters CGA  D2 flexion pattern with blue TB: 2x8/side. Min multi modal cuing for form/technique.   Standing scap retractions with blue TB for neuro re-ed for upright posture, periscapular strengthening, reduce forward leaning gait. 3x12, mod TC's for posture.    PATIENT EDUCATION:  Education details: HEP, POC Person educated: Patient and Spouse Education method: Explanation, Demonstration, and Handouts Education comprehension: verbalized understanding and needs further education  HOME EXERCISE PROGRAM: Access Code: [email or web address] URL: [email or web address] Date: 12/29/2023 Prepared by: Ronnie Derby  Exercises - Isometric Dead Bug  - 1 x daily - 3-4 x weekly - 1 sets - 5 reps - 15 hold - Sit to Stand Without Arm Support  - 1 x daily - 3-4 x weekly - 3 sets - 12 reps - Standing Hip Abduction with Counter Support  - 1 x daily - 3-4 x weekly - 3 sets - 12 reps - Prone Quad Stretch with Towel Roll and Strap  - 1 x daily - 7 x weekly - 1 sets - 3 reps - 30 hold - Seated Hamstring Stretch  - 1 x daily - 7 x weekly - 1 sets - 2 reps - 30 hold - Scapular Retraction with Resistance  - 1 x daily - 7 x weekly - 3 sets - 15 reps  Access Code: [email or web address] URL: [email or web address] Date: 12/20/2023 Prepared by: Ronnie Derby  Exercises - Isometric Dead Bug  - 1 x daily - 3-4 x weekly - 1 sets - 5 reps - 15 hold - Sit to Stand Without Arm Support  - 1 x daily - 3-4 x weekly - 3 sets - 12 reps -  Standing Hip Abduction with Counter Support  - 1 x daily - 3-4 x weekly - 3 sets - 12 reps - Prone Quad Stretch with Towel Roll and Strap  - 1 x daily - 7 x weekly - 1 sets - 3 reps - 30 hold - Seated Hamstring Stretch  - 1 x daily - 7 x weekly - 1 sets - 2 reps - 30 hold   Access Code: [email or web address] URL: [email or web address] Date: 12/06/2023 Prepared by: Ronnie Derby  Exercises - Sit to Stand Without Arm Support  - 1 x daily - 3-4 x weekly - 3 sets - 12 reps - Standing Hip Abduction with Counter Support  - 1 x daily - 3-4 x weekly - 3 sets - 12 reps - Seated Hamstring Stretch  - 1 x daily - 7 x weekly - 1 sets - 2 reps - 30 hold  ASSESSMENT:  CLINICAL IMPRESSION: Continuing PT POC with focus on hip strength, progression in core stability exercises and dynamic balance. Heavier emphasis today on compound, coordination exercises for core stability and proximal LE strengthening. Pt continues to progress with reduction needed overall with multi modal cuing. Progressed HEP with new TB to progress periscapular strengthening and postural re-ed. Educated pt and spouse on importance of maintaining HEP to ensure maintained,  improved symptoms. Pt approaching 10th visit and may be appropriate to discharge pending goal completion. Pt will benefit from skilled PT services to address these impairments and maximize independence with functional mobility.  OBJECTIVE IMPAIRMENTS: Abnormal gait, decreased activity tolerance, decreased balance, decreased endurance, decreased mobility, difficulty walking, decreased ROM, decreased strength, impaired flexibility, postural dysfunction, and pain.   ACTIVITY LIMITATIONS: carrying, lifting, bending, standing, squatting, stairs, and locomotion level  PARTICIPATION LIMITATIONS: community activity  PERSONAL FACTORS: Age, Past/current experiences, Time since onset of injury/illness/exacerbation, and 3+ comorbidities: Dementia, hx of prostate cancer, HLD, HTN    are also affecting patient's functional outcome.   REHAB POTENTIAL: Fair Chronicity of symptoms  CLINICAL DECISION MAKING: Evolving/moderate complexity  EVALUATION COMPLEXITY: Moderate   GOALS: Goals reviewed with patient? No  SHORT TERM GOALS: Target date: 01/03/24  Pt will be independent with HEP to improve lumbar ROM, LE strength, and endurance to improve functional capacity.  Baseline: 12/06/23: provided HEP; 01/07/24: compliant with HEP with spouse assist.  Goal status: MET  LONG TERM GOALS: Target date: 01/31/24  Pt will improve FOTO to target score to demonstrate clinically significant improvement in functional mobility.  Baseline: 12/06/23: Deferred to next session; 12/08/23: 49/58 Goal status: INITIAL  2.  Pt will improve 5xSTS by at least 2.3 seconds to improve LE strength for completing standing and walking ADL's.  Baseline: 12/06/23: 14.81 seconds Goal status: INITIAL  3.  Pt will improve 6 MWT by at least 145' to demonstrate clinically significant improvement in completing community walking distances.  Baseline: 12/06/23; Completed 2 MWT at 361'.; 12/08/23: 952'. Goal status: INITIAL  PLAN:  PT FREQUENCY: 1-2x/week  PT DURATION: 8 weeks  PLANNED INTERVENTIONS: 97164- PT Re-evaluation, 97110-Therapeutic exercises, 97530- Therapeutic activity, 97112- Neuromuscular re-education, 97535- Self Care, 63875- Manual therapy, [phone]- Gait training, 97014- Electrical stimulation (unattended), [phone]- Electrical stimulation (manual), Patient/Family education, Balance training, Stair training, Joint mobilization, Joint manipulation, Spinal manipulation, Spinal mobilization, Cryotherapy, and Moist heat.  PLAN FOR NEXT SESSION: single limb strength training, hip and core strengthening. Hip flexor strengthening for foot clearance.    Delphia Grates. Fairly IV, PT, DPT Physical Therapist- [place]  [facility]  01/07/2024, 9:11 AM

## 2024-01-11 ENCOUNTER — Ambulatory Visit: Payer: PPO

## 2024-01-11 DIAGNOSIS — R293 Abnormal posture: Secondary | ICD-10-CM

## 2024-01-11 DIAGNOSIS — M5459 Other low back pain: Secondary | ICD-10-CM

## 2024-01-11 DIAGNOSIS — R262 Difficulty in walking, not elsewhere classified: Secondary | ICD-10-CM

## 2024-01-11 DIAGNOSIS — M6281 Muscle weakness (generalized): Secondary | ICD-10-CM
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
## 2024-01-14 ENCOUNTER — Ambulatory Visit: Payer: PPO

## 2024-01-18 ENCOUNTER — Ambulatory Visit: Payer: PPO

## 2024-01-18 DIAGNOSIS — R293 Abnormal posture: Secondary | ICD-10-CM

## 2024-01-18 DIAGNOSIS — M5459 Other low back pain: Secondary | ICD-10-CM

## 2024-01-18 DIAGNOSIS — M6281 Muscle weakness (generalized): Secondary | ICD-10-CM

## 2024-01-18 DIAGNOSIS — R262 Difficulty in walking, not elsewhere classified: Secondary | ICD-10-CM

## 2024-01-18 NOTE — Therapy (Signed)
 OUTPATIENT PHYSICAL THERAPY THORACOLUMBAR TREATMENT/PROGRESS NOTE/DISCHARGE SUMMARY Dates of reporting period: 12/06/23 - 01/18/24   Patient Name: Christopher Huang MRN: [id number] DOB:March 19, 1951, 73 y.o., male Today's Date: 01/18/2024  END OF SESSION:  PT End of Session - 01/18/24 1517     Visit Number 10    Number of Visits 17    Date for PT Re-Evaluation 01/31/24    PT Start Time 1517    PT Stop Time 1600    PT Time Calculation (min) 43 min    Activity Tolerance Patient tolerated treatment well    Behavior During Therapy [facility] for tasks assessed/performed              Past Medical History:  Diagnosis Date   Allergic rhinitis    Cancer of prostate (HCC) 02/2007   s/p surgery   Dementia (HCC)    History of kidney stones    H/O   Hyperlipidemia    Hypertension    Personal history of kidney stones    Past Surgical History:  Procedure Laterality Date   ANTERIOR LATERAL LUMBAR FUSION WITH PERCUTANEOUS SCREW 1 LEVEL N/A 01/29/2021   Procedure: L4-5 LATERAL INTERBODY FUSION, L4-5 POSTERIOR FUSION;  Surgeon: Venetia Night, MD;  Location: [facility] ORS;  Service: Neurosurgery;  Laterality: N/A;   basal skin cancers     CATARACT EXTRACTION, BILATERAL     2021   COLONOSCOPY WITH PROPOFOL N/A 11/09/2017   Procedure: COLONOSCOPY WITH PROPOFOL;  Surgeon: Midge Minium, MD;  Location: [facility] ENDOSCOPY;  Service: Endoscopy;  Laterality: N/A;   COLONOSCOPY WITH PROPOFOL N/A 12/01/2022   Procedure: COLONOSCOPY WITH PROPOFOL;  Surgeon: Midge Minium, MD;  Location: [facility] ENDOSCOPY;  Service: Endoscopy;  Laterality: N/A;   EYE SURGERY Bilateral    Cataract   HAND SURGERY     THUMB SURGERY   KNEE ARTHROSCOPY     prostate cancer removed     TONSILLECTOMY     TONSILLECTOMY     TOTAL KNEE ARTHROPLASTY Left 02/09/2020   Procedure: TOTAL KNEE ARTHROPLASTY - RNFA;  Surgeon: Kennedy Bucker, MD;  Location: [facility] ORS;  Service: Orthopedics;  Laterality: Left;   Patient Active Problem List   Diagnosis  Date Noted   Failed back surgical syndrome 03/22/2023   Lumbar facet arthropathy 12/07/2022   Spinal stenosis, lumbar region, without neurogenic claudication (L3/4) 12/07/2022   Lumbar degenerative disc disease 12/07/2022   Chronic knee pain after total replacement of left knee joint 12/07/2022   Polyp of ascending colon 12/01/2022   Heart failure with reduced ejection fraction (HCC) 10/25/2022   LBBB (left bundle branch block) 05/24/2022   Frontotemporal dementia (HCC) 03/27/2022   Vitamin D deficiency 09/26/2021   History of back surgery 03/07/2021   Knee joint replacement status, left 02/09/2020   Advanced care planning/counseling discussion 02/02/2019   Arthritis of hand 11/25/2018   History of colonic polyps    Allergic rhinitis    History of prostate cancer    Hyperlipidemia    Hypertension    Personal history of kidney stones     PCP: Aura Dials, T NP  REFERRING PROVIDER: Drake Leach, PA-C  REFERRING DIAG: Z98.1 (ICD-10-CM) - S/P lumbar fusionM47.816 (ICD-10-CM) - Lumbar spondylosisM48.061 (ICD-10-CM) - Spinal stenosis of lumbar region, unspecified whether neurogenic claudication presentM43.16 (ICD-10-CM) - Spondylolisthesis of lumbar regionM51.360 (ICD-10-CM) - Degeneration of intervertebral disc of lumbar region with discogenic back pain  Rationale for Evaluation and Treatment: Rehabilitation  THERAPY DIAG:  Other low back pain  Abnormal posture  Difficulty in walking,  not elsewhere classified  Muscle weakness (generalized)  ONSET DATE: Chronic. Has had it for many years.  SUBJECTIVE:                                                                                                                                                                                           SUBJECTIVE STATEMENT: Pt reports he continues to do well. Spouse and pt agreeable for PT discharge if goals are met.  PERTINENT HISTORY:  Pt's spouse assisting in subjective. Has had prior  lumbar surgery. Sounds like he has had adjacent lumbar changes above his hardware and spouse states they (MD's) believe this is his source of pain. Spouse reports poor endurance for standing and walking. Pt and spouse state his walking tolerance is only 2-3 minutes. Severe LBP is what is limiting him from progressing, not his LE strength denying neurogenic claudication symptoms. Pain is central, and lower. Reports stabbing pain with bending over, standing, walking. Denies use of a SPC or RW to assist in balance/endurance. Worst pain described worst pain as a 5/10 NPS with aggravating activities. Best pain around 1/10 NPS. Does endorse stiffness and acheiness with weather changes and mornings. Reports forward leaning with walking. Does wear back brace regularly which he states helps his pain.   PAIN:  Are you having pain? Yes: NPRS scale: 0/10 NPS Pain location: Central, lower back Pain description: sharp, stiff, achey Aggravating factors: activity, standing, walking, bending Relieving factors: rest, sitting, back brace  PRECAUTIONS: None  RED FLAGS: Bowel or bladder incontinence: No and Cauda equina syndrome: No   WEIGHT BEARING RESTRICTIONS: No  FALLS:  Has patient fallen in last 6 months? No  LIVING ENVIRONMENT: Lives with: lives with their spouse Lives in: House/apartment Stairs: Yes: External: 3-4 steps; bilateral but cannot reach both Has following equipment at home: None  OCCUPATION: Retired   PLOF: Independent  PATIENT GOALS: To improve his pain and endurance.   NEXT MD VISIT: N/A  OBJECTIVE:  Note: Objective measures were completed at Evaluation unless otherwise noted.  DIAGNOSTIC FINDINGS:  Lumbar MRI dated 10/13/23:  FINDINGS: Segmentation: Transitional lumbosacral anatomy with a partially lumbarized S1 segment, as before. Utilizing this numbering, the patient has undergone previous PLIF at L4-5.   Alignment: The alignment is stable with a mild scoliosis, a grade  1 retrolisthesis at L2-3 and a grade 1 anterolisthesis at L4-5 and L5-S1.   Vertebrae: No worrisome osseous lesion, acute fracture or pars defect. Stable L3 hemangioma. Stable postsurgical changes from L4-5 PLIF.   Conus medullaris: Extends to the T12-L1 level and appears normal.   Paraspinal and other soft tissues: No significant paraspinal  findings.   Disc levels:   Sagittal images demonstrate no significant disc space findings within the visualized lower thoracic spine.   L1-2: Only imaged in the sagittal plane. Preserved disc height with mild bilateral facet hypertrophy. No significant spinal stenosis or nerve root encroachment.   L2-3: Stable chronic loss of disc height with annular disc bulging and endplate osteophytes asymmetric to the right. Moderate facet and ligamentous hypertrophy. Unchanged mild multifactorial spinal stenosis with asymmetric narrowing of the right lateral recess, moderate right and mild left foraminal narrowing.   L3-4: Mild loss of disc height with annular disc bulging and endplate osteophytes asymmetric to the right. There advanced facet and ligamentous hypertrophy contributing to stable moderate to severe multifactorial spinal stenosis. Mild lateral recess and foraminal narrowing, right greater than left, is unchanged.   L4-5: Previous posterior decompression and PLIF. The spinal canal and neural foramina are widely patent.   L5-S1: Chronic mild loss of disc height with annular disc bulging and endplate osteophytes asymmetric to the left. Mild right and moderate left foraminal narrowing. The spinal canal, lateral recesses and right foramen remain widely patent. There is chronic moderate left foraminal narrowing.   S1-2: Transitional disc space level appears stable, without spinal stenosis or nerve root encroachment.   IMPRESSION: 1. No acute findings or significant changes compared with previous study from 11 months prior. 2. Stable  mild multifactorial spinal stenosis at L2-3 with asymmetric narrowing of the right lateral recess, moderate right and mild left foraminal narrowing. 3. Stable moderate to severe multifactorial spinal stenosis at L3-4 with mild lateral recess and foraminal narrowing, right greater than left. 4. Stable postsurgical changes at L4-5 without residual or recurrent spinal stenosis. 5. Stable moderate chronic left foraminal narrowing at L5-S1.     Electronically Signed   By: Carey Bullocks M.D.   On: 10/31/2023 17:23  PATIENT SURVEYS:  FOTO 49/58  COGNITION: Overall cognitive status: History of cognitive impairments - at baseline     SENSATION: WFL  MUSCLE LENGTH: Hamstrings: Right 60 deg; Left 60 deg Thomas test: Positive specifically for rectus femoris tightness bilaterally  POSTURE: decreased lumbar lordosis, increased thoracic kyphosis, and hinging at hip flexors in standing  PALPATION: TTP at L1-L3 at spinous process and L sided paraspinals  LUMBAR ROM:   AROM eval  Flexion 75%  Extension 25%*  Right lateral flexion 50%  Left lateral flexion 50%*  Right rotation 100%  Left rotation 100%   (Blank rows = not tested)  LOWER EXTREMITY ROM:     Active  Right eval Left eval  Hip flexion    Hip extension    Hip abduction    Hip adduction    Hip internal rotation    Hip external rotation    Knee flexion    Knee extension    Ankle dorsiflexion    Ankle plantarflexion    Ankle inversion    Ankle eversion     (Blank rows = not tested)  LOWER EXTREMITY MMT:    MMT Right eval Left eval  Hip flexion 5 5  Hip extension 3 2  Hip abduction 3 3  Hip adduction    Hip internal rotation 4 4  Hip external rotation 5 5  Knee flexion 5 5  Knee extension 5 5  Ankle dorsiflexion 5 5  Ankle plantarflexion 5 4  Ankle inversion    Ankle eversion     (Blank rows = not tested)  LUMBAR SPECIAL TESTS:  Straight leg raise test: Negative, Slump  test: Negative, and FABER  test: Negative  FADDIR: Negative   FUNCTIONAL TESTS:  5 times sit to stand: 14.81 sec 2 minute walk test: 360'  GAIT: Distance walked: 300' Assistive device utilized: None Level of assistance: Complete Independence Comments: Crouched gait, decreased R/L foot clearance  TREATMENT DATE: 01/18/24  Physical Performance:   Reviewed short term and long term goals. See goals section and clinical impression for details.   There.ex:   Discussion on long term HEP compliance for maintaining current gains. Educated pt on flexibility exercise prescription compared to strength training prescription. Updated HEP with new hand out provided. Provided additional theraband for strength progressions with resisted exercises. Discussed gym based programs, Silver Sneakers benefits.   PATIENT EDUCATION:  Education details: HEP, POC Person educated: Patient and Spouse Education method: Explanation, Demonstration, and Handouts Education comprehension: verbalized understanding and needs further education  HOME EXERCISE PROGRAM: Access Code: [email or web address] URL: [email or web address] Date: 12/29/2023 Prepared by: Ronnie Derby  Exercises - Isometric Dead Bug  - 1 x daily - 3-4 x weekly - 1 sets - 5 reps - 15 hold - Sit to Stand Without Arm Support  - 1 x daily - 3-4 x weekly - 3 sets - 12 reps - Standing Hip Abduction with Counter Support  - 1 x daily - 3-4 x weekly - 3 sets - 12 reps - Prone Quad Stretch with Towel Roll and Strap  - 1 x daily - 7 x weekly - 1 sets - 3 reps - 30 hold - Seated Hamstring Stretch  - 1 x daily - 7 x weekly - 1 sets - 2 reps - 30 hold - Scapular Retraction with Resistance  - 1 x daily - 7 x weekly - 3 sets - 15 reps  Access Code: [email or web address] URL: [email or web address] Date: 12/20/2023 Prepared by: Ronnie Derby  Exercises - Isometric Dead Bug  - 1 x daily - 3-4 x weekly - 1 sets - 5 reps - 15 hold - Sit to Stand Without Arm Support  - 1 x daily - 3-4  x weekly - 3 sets - 12 reps - Standing Hip Abduction with Counter Support  - 1 x daily - 3-4 x weekly - 3 sets - 12 reps - Prone Quad Stretch with Towel Roll and Strap  - 1 x daily - 7 x weekly - 1 sets - 3 reps - 30 hold - Seated Hamstring Stretch  - 1 x daily - 7 x weekly - 1 sets - 2 reps - 30 hold   Access Code: [email or web address] URL: [email or web address] Date: 12/06/2023 Prepared by: Ronnie Derby  Exercises - Sit to Stand Without Arm Support  - 1 x daily - 3-4 x weekly - 3 sets - 12 reps - Standing Hip Abduction with Counter Support  - 1 x daily - 3-4 x weekly - 3 sets - 12 reps - Seated Hamstring Stretch  - 1 x daily - 7 x weekly - 1 sets - 2 reps - 30 hold  ASSESSMENT:  CLINICAL IMPRESSION: Pt on 10th visit and has met all STG and LTG's. Pt has significantly improved FOTO to target score, met 5xSTS time, and greatly improved 6 MWT. These goals indicate reduced falls risk, improved LE strength, and overall improved functional mobility with household/community ADL's and recreational activity. Time spent reviewing updated HEP with pt and spouse and long term management of chronic condition. Al questions answered from pt and spouse. Pt formally discharged from PT.   OBJECTIVE IMPAIRMENTS: Abnormal gait, decreased activity  tolerance, decreased balance, decreased endurance, decreased mobility, difficulty walking, decreased ROM, decreased strength, impaired flexibility, postural dysfunction, and pain.   ACTIVITY LIMITATIONS: carrying, lifting, bending, standing, squatting, stairs, and locomotion level  PARTICIPATION LIMITATIONS: community activity  PERSONAL FACTORS: Age, Past/current experiences, Time since onset of injury/illness/exacerbation, and 3+ comorbidities: Dementia, hx of prostate cancer, HLD, HTN   are also affecting patient's functional outcome.   REHAB POTENTIAL: Fair Chronicity of symptoms  CLINICAL DECISION MAKING: Evolving/moderate complexity  EVALUATION  COMPLEXITY: Moderate   GOALS: Goals reviewed with patient? No  SHORT TERM GOALS: Target date: 01/03/24  Pt will be independent with HEP to improve lumbar ROM, LE strength, and endurance to improve functional capacity.  Baseline: 12/06/23: provided HEP; 01/07/24: compliant with HEP with spouse assist.  Goal status: MET  LONG TERM GOALS: Target date: 01/31/24  Pt will improve FOTO to target score to demonstrate clinically significant improvement in functional mobility.  Baseline: 12/06/23: Deferred to next session; 12/08/23: 49/58; 58/58 Goal status: MET  2.  Pt will improve 5xSTS by at least 2.3 seconds to improve LE strength for completing standing and walking ADL's.  Baseline: 12/06/23: 14.81 seconds; 01/18/24: 12.13 seconds Goal status: MET  3.  Pt will improve 6 MWT by at least 145' to demonstrate clinically significant improvement in completing community walking distances.  Baseline: 12/06/23; Completed 2 MWT at 361'.; 12/08/23: 952'; 01/18/24: 1,235' Goal status: MET  PLAN:  PT FREQUENCY: 1-2x/week  PT DURATION: 8 weeks  PLANNED INTERVENTIONS: 97164- PT Re-evaluation, 97110-Therapeutic exercises, 97530- Therapeutic activity, 97112- Neuromuscular re-education, 97535- Self Care, 21308- Manual therapy, [phone]- Gait training, 97014- Electrical stimulation (unattended), [phone]- Electrical stimulation (manual), Patient/Family education, Balance training, Stair training, Joint mobilization, Joint manipulation, Spinal manipulation, Spinal mobilization, Cryotherapy, and Moist heat.  PLAN FOR NEXT SESSION: Discharged.   Delphia Grates. Fairly IV, PT, DPT Physical Therapist- [place]  [facility]  01/18/2024, 3:18 PM

## 2024-02-12 ENCOUNTER — Other Ambulatory Visit: Payer: Self-pay | Admitting: Cardiovascular Disease

## 2024-02-17 DIAGNOSIS — L538 Other specified erythematous conditions: Secondary | ICD-10-CM | POA: Diagnosis not present

## 2024-02-17 DIAGNOSIS — D2262 Melanocytic nevi of left upper limb, including shoulder: Secondary | ICD-10-CM | POA: Diagnosis not present

## 2024-02-17 DIAGNOSIS — D0462 Carcinoma in situ of skin of left upper limb, including shoulder: Secondary | ICD-10-CM | POA: Diagnosis not present

## 2024-02-17 DIAGNOSIS — L570 Actinic keratosis: Secondary | ICD-10-CM | POA: Diagnosis not present

## 2024-02-17 DIAGNOSIS — D485 Neoplasm of uncertain behavior of skin: Secondary | ICD-10-CM | POA: Diagnosis not present

## 2024-02-17 DIAGNOSIS — C44629 Squamous cell carcinoma of skin of left upper limb, including shoulder: Secondary | ICD-10-CM | POA: Diagnosis not present

## 2024-02-17 DIAGNOSIS — L820 Inflamed seborrheic keratosis: Secondary | ICD-10-CM | POA: Diagnosis not present

## 2024-02-17 DIAGNOSIS — D2261 Melanocytic nevi of right upper limb, including shoulder: Secondary | ICD-10-CM | POA: Diagnosis not present

## 2024-02-17 DIAGNOSIS — D2272 Melanocytic nevi of left lower limb, including hip: Secondary | ICD-10-CM | POA: Diagnosis not present

## 2024-02-17 DIAGNOSIS — D225 Melanocytic nevi of trunk: Secondary | ICD-10-CM | POA: Diagnosis not present

## 2024-02-17 DIAGNOSIS — L2989 Other pruritus: Secondary | ICD-10-CM | POA: Diagnosis not present

## 2024-02-29 DIAGNOSIS — C44629 Squamous cell carcinoma of skin of left upper limb, including shoulder: Secondary | ICD-10-CM | POA: Diagnosis not present

## 2024-03-14 DIAGNOSIS — D0462 Carcinoma in situ of skin of left upper limb, including shoulder: Secondary | ICD-10-CM | POA: Diagnosis not present

## 2024-03-28 ENCOUNTER — Ambulatory Visit (INDEPENDENT_AMBULATORY_CARE_PROVIDER_SITE_OTHER): Payer: Self-pay | Admitting: Emergency Medicine

## 2024-03-28 VITALS — Ht 74.0 in | Wt 218.0 lb

## 2024-03-28 DIAGNOSIS — Z0000 Encounter for general adult medical examination without abnormal findings: Secondary | ICD-10-CM

## 2024-03-28 NOTE — Progress Notes (Signed)
 Subjective:   Christopher Huang is a 73 y.o. who presents for a Medicare Wellness preventive visit.  Visit Complete: Virtual I connected with  Christopher Huang on 03/28/24 by a audio enabled telemedicine application and verified that I am speaking with the correct person using two identifiers.  Patient Location: Home  Provider Location: Home Office  I discussed the limitations of evaluation and management by telemedicine. The patient expressed understanding and agreed to proceed.  Vital Signs: Because this visit was a virtual/telehealth visit, some criteria may be missing or patient reported. Any vitals not documented were not able to be obtained and vitals that have been documented are patient reported.  VideoDeclined- This patient declined Librarian, academic. Therefore the visit was completed with audio only.  Persons Participating in Visit:  Christopher Huang, wife and patient was present during visit.  AWV Questionnaire: Yes: Patient Medicare AWV questionnaire was completed by the patient on 03/25/24; I have confirmed that all information answered by patient is correct and no changes since this date.  Cardiac Risk Factors include: advanced age (>46men, >60 women);male gender;hypertension;dyslipidemia     Objective:    Today's Vitals   03/28/24 1546  Weight: 218 lb (98.9 kg)  Height: 6\' 2"  (1.88 m)   Body mass index is 27.99 kg/m.     03/28/2024    4:00 PM 12/06/2023    3:59 PM 07/22/2023    8:05 AM 07/14/2023    7:53 AM 05/19/2023   12:51 PM 03/15/2023    1:44 PM 02/17/2023    8:45 AM  Advanced Directives  Does Patient Have a Medical Advance Directive? Yes No Yes Yes Yes Yes Yes  Type of Estate agent of [place];Living will    Healthcare Power of [place];Living will Healthcare Power of [place];Living will Healthcare Power of [place];Living will  Does patient want to make changes to medical advance directive? No - Patient declined   No -  Guardian declined  No - Patient declined   Copy of Healthcare Power of Attorney in Chart? Yes - validated most recent copy scanned in chart (See row information)     Yes - validated most recent copy scanned in chart (See row information)   Would patient like information on creating a medical advance directive?  No - Patient declined         Current Medications (verified) Outpatient Encounter Medications as of 03/28/2024  Medication Sig   Acetaminophen  (ACETAMINOPHEN  EXTRA STRENGTH) 500 MG capsule Take 1 capsule by mouth every 6 (six) hours as needed for fever.   ASPIRIN  81 PO Take 81 mg by mouth daily.   carvedilol  (COREG ) 3.125 MG tablet TAKE 1 TABLET BY MOUTH TWICE A DAY   Cholecalciferol  (VITAMIN D3) 50 MCG (2000 UT) TABS Take 2,000 Units by mouth daily.   Coenzyme Q10 100 MG TABS Take 100 mg by mouth daily.   docusate sodium  (COLACE) 100 MG capsule Take 100 mg by mouth daily.   donepezil  (ARICEPT ) 5 MG tablet Take 1 tablet (5 mg total) by mouth at bedtime.   empagliflozin  (JARDIANCE ) 10 MG TABS tablet Take 1 tablet (10 mg total) by mouth daily before breakfast.   fluticasone  (FLONASE ) 50 MCG/ACT nasal spray PLACE 2 SPRAYS INTO EACH NOSTRIL DAILY.   ketoconazole (NIZORAL) 2 % shampoo APPLY TO AFFECTED AREAS OF SCALP AND BROWS (LEAVE ON FOR 5 MIN), RINSE WELL, USE 2-3 TIMES PER WEEK   levocetirizine (XYZAL ) 5 MG tablet Take 1 tablet (5 mg total)  by mouth every evening.   Misc Natural Products (RELAX & SLEEP PO) Take by mouth. Reports taking "Relaxium" nightly for sleep (contains 5mg  Melatonin, 100mg  Magnesium , L-Tryptophan, Valerest, Ashwagandha, 100mg  GABA, Chamomile and Passionflower)   Multiple Vitamin (MULTIVITAMIN WITH MINERALS) TABS tablet Take 1 tablet by mouth daily. Men's Multivitamin 50+   rosuvastatin  (CRESTOR ) 40 MG tablet Take 1 tablet (40 mg total) by mouth daily. Stop taking 20 MG tablets.   sacubitril -valsartan  (ENTRESTO ) 49-51 MG Take 1 tablet by mouth 2 (two) times daily.    vitamin B-12 (CYANOCOBALAMIN ) 500 MCG tablet Take 500 mcg by mouth daily.   No facility-administered encounter medications on file as of 03/28/2024.    Allergies (verified) Percocet [oxycodone -acetaminophen ], Tramadol, and Vicodin [hydrocodone-acetaminophen ]   History: Past Medical History:  Diagnosis Date   Allergic rhinitis    Cancer of prostate (HCC) 02/2007   s/p surgery   Dementia (HCC)    History of kidney stones    H/O   Hyperlipidemia    Hypertension    Personal history of kidney stones    Past Surgical History:  Procedure Laterality Date   ANTERIOR LATERAL LUMBAR FUSION WITH PERCUTANEOUS SCREW 1 LEVEL N/A 01/29/2021   Procedure: L4-5 LATERAL INTERBODY FUSION, L4-5 POSTERIOR FUSION;  Surgeon: Jodeen Munch, MD;  Location: [facility];  Service: Neurosurgery;  Laterality: N/A;   basal skin cancers     CATARACT EXTRACTION, BILATERAL     2021   COLONOSCOPY WITH PROPOFOL  N/A 11/09/2017   Procedure: COLONOSCOPY WITH PROPOFOL ;  Surgeon: Marnee Sink, MD;  Location: [facility];  Service: Endoscopy;  Laterality: N/A;   COLONOSCOPY WITH PROPOFOL  N/A 12/01/2022   Procedure: COLONOSCOPY WITH PROPOFOL ;  Surgeon: Marnee Sink, MD;  Location: [facility];  Service: Endoscopy;  Laterality: N/A;   EYE SURGERY Bilateral    Cataract   HAND SURGERY     THUMB SURGERY   KNEE ARTHROSCOPY     prostate cancer removed     TONSILLECTOMY     TONSILLECTOMY     TOTAL KNEE ARTHROPLASTY Left 02/09/2020   Procedure: TOTAL KNEE ARTHROPLASTY - RNFA;  Surgeon: Molli Angelucci, MD;  Location: [facility];  Service: Orthopedics;  Laterality: Left;   Family History  Problem Relation Age of Onset   Dementia Mother    Prostate cancer Father    Social History   Socioeconomic History   Marital status: Married    Spouse name: Christopher Huang   Number of children: 0   Years of education: Not on file   Highest education level: 12th grade  Occupational History   Occupation: retired   Tobacco Use   Smoking  status: Never    Passive exposure: Never   Smokeless tobacco: Never  Vaping Use   Vaping status: Never Used  Substance and Sexual Activity   Alcohol use: No   Drug use: No   Sexual activity: Yes  Other Topics Concern   Not on file  Social History Narrative   Goes fishing and hunting    Meets with friends every morning for breakfast    Church    Social Drivers of Health   Financial Resource Strain: Low Risk  (03/28/2024)   Overall Financial Resource Strain (CARDIA)    Difficulty of Paying Living Expenses: Not hard at all  Food Insecurity: No Food Insecurity (03/28/2024)   Hunger Vital Sign    Worried About Running Out of Food in the Last Year: Never true    Ran Out of Food in the Last Year: Never true  Transportation Needs: No Transportation Needs (03/28/2024)   PRAPARE - Administrator, Civil Service (Medical): No    Lack of Transportation (Non-Medical): No  Physical Activity: Insufficiently Active (03/28/2024)   Exercise Vital Sign    Days of Exercise per Week: 7 days    Minutes of Exercise per Session: 20 min  Stress: No Stress Concern Present (03/28/2024)   [employer] of Occupational Health - Occupational Stress Questionnaire    Feeling of Stress : Not at all  Social Connections: Moderately Integrated (03/28/2024)   Social Connection and Isolation Panel [NHANES]    Frequency of Communication with Friends and Family: Three times a week    Frequency of Social Gatherings with Friends and Family: More than three times a week    Attends Religious Services: More than 4 times per year    Active Member of [employer] or Organizations: No    Attends Engineer, structural: Never    Marital Status: Married    Tobacco Counseling Counseling given: Not Answered    Clinical Intake:  Pre-visit preparation completed: Yes  Pain : No/denies pain     BMI - recorded: 27.99 Nutritional Status: BMI 25 -29 Overweight Nutritional Risks: None Diabetes: No  Lab Results   Component Value Date   HGBA1C 5.6 09/26/2021     How often do you need to have someone help you when you read instructions, pamphlets, or other written materials from your doctor or pharmacy?: 1 - Never  Interpreter Needed?: No  Information entered by :: Christopher Huang, Christopher Huang   Activities of Daily Living     03/28/2024    3:48 PM 03/25/2024    1:56 PM  In your present state of health, do you have any difficulty performing the following activities:  Hearing? 0 0  Vision? 0 0  Difficulty concentrating or making decisions? 1 1  Comment dementia   Walking or climbing stairs? 0 0  Dressing or bathing? 0 0  Doing errands, shopping? 0 1  Preparing Food and eating ? N N  Using the Toilet? N N  In the past six months, have you accidently leaked urine? N N  Do you have problems with loss of bowel control? N N  Managing your Medications? Christopher Huang  Comment wife fills pill box   Managing your Finances? Christopher Huang  Comment wife helps   Housekeeping or managing your Housekeeping? N N    Patient Care Team: Lemar Pyles, NP as PCP - General (Nurse Practitioner) Marnee Sink, MD as Consulting Physician (Gastroenterology) Enriqueta Harvey, RN as Case Manager (General Practice) [facility] (Optometry) Jerelene Monday, Deadra Everts, MD as Consulting Physician (Cardiology) Dasher, Margette Sheldon, MD (Dermatology) Lucetta Russel, PA-C as Physician Assistant (Neurosurgery)  Indicate any recent Medical Services you may have received from other than [facility] providers in the past year (date may be approximate).     Assessment:   This is a routine wellness examination for [employer].  Hearing/Vision screen Hearing Screening - Comments:: Denies hearing loss Vision Screening - Comments:: Gets routine eye exams, Dr. Violet Grew [place]   Goals Addressed               This Visit's Progress     Increase physical activity (pt-stated)         Depression Screen     03/28/2024    3:57 PM 12/31/2023    9:27 AM 09/30/2023     9:00 AM 03/30/2023    8:42  AM 03/15/2023    1:42 PM 01/25/2023    1:50 PM 12/21/2022   10:21 AM  PHQ 2/9 Scores  PHQ - 2 Score 0 0 1 0 0 0 0  PHQ- 9 Score 0 0 2 0 0      Fall Risk     03/28/2024    4:00 PM 03/25/2024    1:56 PM 12/31/2023    9:27 AM 09/30/2023    9:00 AM 07/22/2023    8:05 AM  Fall Risk   Falls in the past year? 0 0 0 0 0  Number falls in past yr: 0  0 0   Injury with Fall? 0  0 0   Risk for fall due to : No Fall Risks  No Fall Risks No Fall Risks   Follow up Falls prevention discussed;Falls evaluation completed  Falls evaluation completed Falls evaluation completed     MEDICARE RISK AT HOME:  Medicare Risk at Home Any stairs in or around the home?: Yes If so, are there any without handrails?: No Home free of loose throw rugs in walkways, pet beds, electrical cords, etc?: Yes Adequate lighting in your home to reduce risk of falls?: Yes Life alert?: No Use of a cane, walker or w/c?: No Grab bars in the bathroom?: Yes Shower chair or bench in shower?: Yes Elevated toilet seat or a handicapped toilet?: Yes  TIMED UP AND GO:  Was the test performed?  No  Cognitive Function: 6CIT completed      05/05/2022   11:21 AM  Montreal Cognitive Assessment   Visuospatial/ Executive (0/5) 0  Naming (0/3) 3  Attention: Read list of digits (0/2) 2  Attention: Read list of letters (0/1) 1  Attention: Serial 7 subtraction starting at 100 (0/3) 0  Language: Repeat phrase (0/2) 1  Language : Fluency (0/1) 0  Abstraction (0/2) 1  Delayed Recall (0/5) 0  Orientation (0/6) 3  Total 11  Adjusted Score (based on education) 11      03/28/2024    4:01 PM 12/31/2023    9:58 AM 03/15/2023    1:48 PM 03/27/2022    8:22 AM 02/07/2021    3:23 PM  6CIT Screen  What Year? 4 points 0 points 0 points 0 points 0 points  What month? 3 points 3 points 3 points 0 points 0 points  What time? 0 points 0 points 3 points 0 points 0 points  Count back from 20 0 points 0 points 0 points 0 points  0 points  Months in reverse 4 points 4 points 4 points 4 points 2 points  Repeat phrase 10 points 10 points 10 points 10 points 0 points  Total Score 21 points 17 points 20 points 14 points 2 points    Immunizations Immunization History  Administered Date(s) Administered   Fluad Quad(high Dose 65+) 08/04/2019, 08/16/2020, 08/20/2021, 07/28/2022   Influenza, High Dose Seasonal PF 08/11/2018, 08/23/2023   Influenza-Unspecified 11/06/2014, 09/06/2015   PFIZER(Purple Top)SARS-COV-2 Vaccination 01/03/2020, 01/24/2020, 09/12/2020   Pneumococcal Conjugate-13 07/07/2016   Pneumococcal Polysaccharide-23 11/12/2017   Td 11/28/2015   Zoster Recombinant(Shingrix) 02/10/2022, 05/01/2022    Screening Tests Health Maintenance  Topic Date Due   COVID-19 Vaccine (4 - 2024-25 season) 07/25/2023   INFLUENZA VACCINE  06/23/2024   Medicare Annual Wellness (AWV)  03/28/2025   DTaP/Tdap/Td (2 - Tdap) 11/27/2025   Colonoscopy  12/01/2025   Pneumonia Vaccine 65+ Years old  Completed   Hepatitis C Screening  Completed   Zoster  Vaccines- Shingrix  Completed   HPV VACCINES  Aged Out   Meningococcal B Vaccine  Aged Out    Health Maintenance  Health Maintenance Due  Topic Date Due   COVID-19 Vaccine (4 - 2024-25 season) 07/25/2023   Health Maintenance Items Addressed: See Nurse Notes  Additional Screening:  Vision Screening: Recommended annual ophthalmology exams for early detection of glaucoma and other disorders of the eye.  Dental Screening: Recommended annual dental exams for proper oral hygiene  Community Resource Referral / Chronic Care Management: CRR required this visit?  No   CCM required this visit?  No     Plan:     I have personally reviewed and noted the following in the patient's chart:   Medical and social history Use of alcohol, tobacco or illicit drugs  Current medications and supplements including opioid prescriptions. Patient is not currently taking opioid  prescriptions. Functional ability and status Nutritional status Physical activity Advanced directives List of other physicians Hospitalizations, surgeries, and ER visits in previous 12 months Vitals Screenings to include cognitive, depression, and falls Referrals and appointments  In addition, I have reviewed and discussed with patient certain preventive protocols, quality metrics, and best practice recommendations. A written personalized care plan for preventive services as well as general preventive health recommendations were provided to patient.     Christopher Huang, Christopher Huang   03/28/2024   After Visit Summary: (MyChart) Due to this being a telephonic visit, the after visit summary with patients personalized plan was offered to patient via MyChart   Notes: Please refer to Routing Comments.

## 2024-03-28 NOTE — Patient Instructions (Addendum)
 Christopher Huang , Thank you for taking time to come for your Medicare Wellness Visit. I appreciate your ongoing commitment to your health goals. Please review the following plan we discussed and let me know if I can assist you in the future.   Referrals/Orders/Follow-Ups/Clinician Recommendations: Keep up the good work!  This is a list of the screening recommended for you and due dates:  Health Maintenance  Topic Date Due   COVID-19 Vaccine (4 - 2024-25 season) 07/25/2023   Flu Shot  06/23/2024   Medicare Annual Wellness Visit  03/28/2025   DTaP/Tdap/Td vaccine (2 - Tdap) 11/27/2025   Colon Cancer Screening  12/01/2025   Pneumonia Vaccine  Completed   Hepatitis C Screening  Completed   Zoster (Shingles) Vaccine  Completed   HPV Vaccine  Aged Out   Meningitis B Vaccine  Aged Out    Advanced directives: (In Chart) A copy of your advanced directives are scanned into your chart should your provider ever need it.  Next Medicare Annual Wellness Visit scheduled for next year: Yes, 03/29/25 @ 3:10pm (phone visit)  Have you seen your provider in the last 6 months (3 months if uncontrolled diabetes)? Yes

## 2024-04-01 NOTE — Patient Instructions (Signed)
Be Involved in Caring For Your Health:  Taking Medications When medications are taken as directed, they can greatly improve your health. But if they are not taken as prescribed, they may not work. In some cases, not taking them correctly can be harmful. To help ensure your treatment remains effective and safe, understand your medications and how to take them. Bring your medications to each visit for review by your provider.  Your lab results, notes, and after visit summary will be available on My Chart. We strongly encourage you to use this feature. If lab results are abnormal the clinic will contact you with the appropriate steps. If the clinic does not contact you assume the results are satisfactory. You can always view your results on My Chart. If you have questions regarding your health or results, please contact the clinic during office hours. You can also ask questions on My Chart.  We at [facility] are grateful that you chose [place] to provide your care. We strive to provide evidence-based and compassionate care and are always looking for feedback. If you get a survey from the clinic please complete this so we can hear your opinions.  Memory Compensation Strategies  Use "WARM" strategy.  W= write it down  A= associate it  R= repeat it  M= make a mental note  2.   You can keep a Glass blower/designer.  Use a 3-ring notebook with sections for the following: calendar, important names and phone numbers,  medications, doctors' names/phone numbers, lists/reminders, and a section to journal what you did  each day.   3.    Use a calendar to write appointments down.  4.    Write yourself a schedule for the day.  This can be placed on the calendar or in a separate section of the Memory Notebook.  Keeping a  regular schedule can help memory.  5.    Use medication organizer with sections for each day or morning/evening pills.  You may need help loading it  6.    Keep a basket, or pegboard  by the door.  Place items that you need to take out with you in the basket or on the pegboard.  You may also want to  include a message board for reminders.  7.    Use sticky notes.  Place sticky notes with reminders in a place where the task is performed.  For example: " turn off the  stove" placed by the stove, "lock the door" placed on the door at eye level, " take your medications" on  the bathroom mirror or by the place where you normally take your medications.  8.    Use alarms/timers.  Use while cooking to remind yourself to check on food or as a reminder to take your medicine, or as a  reminder to make a call, or as a reminder to perform another task, etc.

## 2024-04-04 ENCOUNTER — Encounter: Payer: Self-pay | Admitting: Nurse Practitioner

## 2024-04-04 ENCOUNTER — Ambulatory Visit (INDEPENDENT_AMBULATORY_CARE_PROVIDER_SITE_OTHER): Payer: PPO | Admitting: Nurse Practitioner

## 2024-04-04 VITALS — BP 134/61 | HR 75 | Temp 97.7°F | Resp 18 | Wt 217.4 lb

## 2024-04-04 DIAGNOSIS — F0280 Dementia in other diseases classified elsewhere without behavioral disturbance: Secondary | ICD-10-CM

## 2024-04-04 DIAGNOSIS — I5020 Unspecified systolic (congestive) heart failure: Secondary | ICD-10-CM

## 2024-04-04 DIAGNOSIS — E782 Mixed hyperlipidemia: Secondary | ICD-10-CM | POA: Diagnosis not present

## 2024-04-04 DIAGNOSIS — Z8546 Personal history of malignant neoplasm of prostate: Secondary | ICD-10-CM | POA: Diagnosis not present

## 2024-04-04 DIAGNOSIS — I10 Essential (primary) hypertension: Secondary | ICD-10-CM | POA: Diagnosis not present

## 2024-04-04 DIAGNOSIS — G3109 Other frontotemporal dementia: Secondary | ICD-10-CM | POA: Diagnosis not present

## 2024-04-04 DIAGNOSIS — M48061 Spinal stenosis, lumbar region without neurogenic claudication: Secondary | ICD-10-CM | POA: Diagnosis not present

## 2024-04-04 DIAGNOSIS — E559 Vitamin D deficiency, unspecified: Secondary | ICD-10-CM

## 2024-04-04 MED ORDER — DONEPEZIL HCL 10 MG PO TABS: 10.0000 mg | ORAL_TABLET | Freq: Every day | ORAL | 3 refills | Status: DC

## 2024-04-04 NOTE — Assessment & Plan Note (Signed)
 PSA today and check annually at this time.  No new symptoms.

## 2024-04-04 NOTE — Assessment & Plan Note (Signed)
 Chronic, progressive.  Diagnosed 04/15/22, remains stable at this time.  Family history of dementia in mother and father.  Continue collaboration with neurology as needed and will trial increasing Aricept  to 10 MG, if headaches present will return to 5 MG daily.  Continue B12 supplement.

## 2024-04-04 NOTE — Assessment & Plan Note (Signed)
Chronic, ongoing.  BP at goal today.  Continue current medication regimen and collaboration with cardiology.  Recommend he monitor BP at least a few mornings a week at home and document.  DASH diet at home.  Labs today: CBC, CMP, TSH.

## 2024-04-04 NOTE — Assessment & Plan Note (Signed)
 Chronic.  Diagnosed on 06/30/22 with EF 30-35%, recent echo August 2024 similar.  Followed by cardiology.  Euvolemic.  They have grant to assist in covering Entresto  and Jardiance . Recommend: - Reminded to call for an overnight weight gain of >2 pounds or a weekly weight gain of >5 pounds - not adding salt to food and read food labels. Reviewed the importance of keeping daily sodium intake to 2000mg  daily.  - No NSAIDS

## 2024-04-04 NOTE — Assessment & Plan Note (Addendum)
 Chronic, ongoing.  Continue Rosuvastatin  daily.  Adjust dose as needed.  Obtain labs today.

## 2024-04-04 NOTE — Progress Notes (Signed)
 BP 134/61   Pulse 75   Temp 97.7 F (36.5 C) (Oral)   Resp 18   Wt 217 lb 6.4 oz (98.6 kg)   SpO2 93%   BMI 27.91 kg/m    Subjective:    Patient ID: Christopher Huang, male    DOB: 11/03/1951, 73 y.o.   MRN: [id number]  HPI: GOKUL ARRANT is a 73 y.o. male  Chief Complaint  Patient presents with   Congestive Heart Failure   Dementia   Hyperlipidemia   Hypertension   History of prostate cancer years ago with surgical intervention.  HYPERTENSION / HYPERLIPIDEMIA/HF Takes Carvedilol , Rosuvastatin , Entresto , and Jardiance .  Saw cardiology last on 07/12/23.  Last echo was in August 2024 and noted EF 30-35%. Satisfied with current treatment? yes Duration of hypertension: chronic BP monitoring frequency: not checking BP range:  BP medication side effects: no Duration of hyperlipidemia: chronic Cholesterol medication side effects: no Cholesterol supplements: none  Medication compliance: good compliance Aspirin : yes Recent stressors: no Recurrent headaches: no Visual changes: no Palpitations: no Dyspnea: no Chest pain: no Lower extremity edema: no Dizzy/lightheaded: no   DEMENTIA: Continues to take Aricept  5 MG daily, which is tolerated.  Initial presentation 03/27/22 with imaging performed 04/15/22 noting asymmetric left temporal lobe atrophy, ?semantic dementia. Saw neurology on 05/05/22, Dr. Billy Bue, to continue on Donepezil  and perform neuropsychological testing -- which they chose not to attain.  They do not wish to return to neurology at this time.  Wife reports some issues continue with remembering events or staying on task.  Continues to drive to certain areas he knows best, but no where else unless wife with him. No behaviors or issues with incontinence.  No recent falls.  Both of his parents had mild dementia, both lived into upper 92's.  History of back surgery in March 2022. Sees pain clinic, last 07/22/23 and had ablation of nerves in past. Going to neurosurgery with  last visit 12/02/23.  Recently performed PT and this offered a lot of benefit.  Takes Vitamin D  daily. Date of diagnosis: 04/15/22 Last imaging: 04/15/22 Neurology visits: 05/05/22 Current memory care medications: Aricept  Medication compliance: good Family support: present with wife Living situation: lives with wife Recent Falls: none Incontinent/Continent: continent  Meal intake: no changes, eats well Behaviors: none Sleep pattern: no issues    03/28/2024    4:01 PM 12/31/2023    9:58 AM 03/15/2023    1:48 PM 03/27/2022    8:22 AM 02/07/2021    3:23 PM  6CIT Screen  What Year? 4 points 0 points 0 points 0 points 0 points  What month? 3 points 3 points 3 points 0 points 0 points  What time? 0 points 0 points 3 points 0 points 0 points  Count back from 20 0 points 0 points 0 points 0 points 0 points  Months in reverse 4 points 4 points 4 points 4 points 2 points  Repeat phrase 10 points 10 points 10 points 10 points 0 points  Total Score 21 points 17 points 20 points 14 points 2 points      04/04/2024    8:43 AM 03/28/2024    3:57 PM 12/31/2023    9:27 AM 09/30/2023    9:00 AM 03/30/2023    8:42 AM  Depression screen PHQ 2/9  Decreased Interest 1 0 0 1 0  Down, Depressed, Hopeless 0 0 0 0 0  PHQ - 2 Score 1 0 0 1 0  Altered sleeping 0  0 0 0 0  Tired, decreased energy 0 0 0 1 0  Change in appetite 0 0 0 0 0  Feeling bad or failure about yourself  0 0 0 0 0  Trouble concentrating 1 0 0 0 0  Moving slowly or fidgety/restless 0 0 0 0 0  Suicidal thoughts 0 0 0 0 0  PHQ-9 Score 2 0 0 2 0  Difficult doing work/chores Not difficult at all Not difficult at all Not difficult at all Not difficult at all Not difficult at all      04/04/2024    8:43 AM 12/31/2023    9:27 AM 09/30/2023    9:00 AM 03/30/2023    8:42 AM  GAD 7 : Generalized Anxiety Score  Nervous, Anxious, on Edge 1 0 0 0  Control/stop worrying 1 0 0 0  Worry too much - different things 0 0 0 0  Trouble relaxing 1 0 0 0  Restless 0  0 0 0  Easily annoyed or irritable 1 0 0 0  Afraid - awful might happen 0 0 0 0  Total GAD 7 Score 4 0 0 0  Anxiety Difficulty Not difficult at all Not difficult at all Not difficult at all Not difficult at all   Relevant past medical, surgical, family and social history reviewed and updated as indicated. Interim medical history since our last visit reviewed. Allergies and medications reviewed and updated.  Review of Systems  Constitutional:  Negative for activity change, appetite change, diaphoresis, fatigue and fever.  Respiratory:  Negative for cough, chest tightness, shortness of breath and wheezing.   Cardiovascular:  Negative for chest pain, palpitations and leg swelling.  Gastrointestinal: Negative.   Endocrine: Negative.   Neurological:  Negative for dizziness, syncope, weakness, light-headedness, numbness and headaches.  Psychiatric/Behavioral: Negative.  Negative for decreased concentration, self-injury, sleep disturbance and suicidal ideas. The patient is not nervous/anxious.    Per HPI unless specifically indicated above     Objective:    BP 134/61   Pulse 75   Temp 97.7 F (36.5 C) (Oral)   Resp 18   Wt 217 lb 6.4 oz (98.6 kg)   SpO2 93%   BMI 27.91 kg/m   Wt Readings from Last 3 Encounters:  04/04/24 217 lb 6.4 oz (98.6 kg)  03/28/24 218 lb (98.9 kg)  12/31/23 219 lb 3.2 oz (99.4 kg)    Physical Exam Vitals and nursing note reviewed.  Constitutional:      General: He is awake. He is not in acute distress.    Appearance: Normal appearance. He is well-developed and well-groomed. He is not ill-appearing or toxic-appearing.  HENT:     Head: Normocephalic and atraumatic.     Right Ear: Hearing and external ear normal. No drainage.     Left Ear: Hearing and external ear normal. No drainage.  Eyes:     General: Lids are normal.        Right eye: No discharge.        Left eye: No discharge.     Conjunctiva/sclera: Conjunctivae normal.     Pupils: Pupils are  equal, round, and reactive to light.  Neck:     Thyroid : No thyromegaly.     Vascular: No carotid bruit.  Cardiovascular:     Rate and Rhythm: Normal rate and regular rhythm.     Heart sounds: Normal heart sounds, S1 normal and S2 normal. No murmur heard.    No gallop.  Comments: Occasional extra beats noted on auscultation. Pulmonary:     Effort: Pulmonary effort is normal. No accessory muscle usage or respiratory distress.     Breath sounds: Normal breath sounds. No decreased breath sounds, wheezing or rhonchi.  Abdominal:     General: Abdomen is flat. Bowel sounds are normal. There is no distension.     Palpations: Abdomen is soft. There is no hepatomegaly or splenomegaly.     Tenderness: There is no abdominal tenderness.  Musculoskeletal:        General: Normal range of motion.     Cervical back: Normal range of motion and neck supple.     Right lower leg: No edema.     Left lower leg: No edema.  Lymphadenopathy:     Cervical: No cervical adenopathy.  Skin:    General: Skin is warm and dry.     Capillary Refill: Capillary refill takes less than 2 seconds.  Neurological:     Mental Status: He is alert and oriented to person, place, and time.     Deep Tendon Reflexes: Reflexes are normal and symmetric.     Comments: Oriented to month, day of week, location, and year.  Cannot recall president.  Psychiatric:        Attention and Perception: Attention normal.        Mood and Affect: Mood normal.        Speech: Speech normal.        Behavior: Behavior normal. Behavior is cooperative.        Thought Content: Thought content normal.    Results for orders placed or performed in visit on 09/30/23  Comprehensive metabolic panel   Collection Time: 09/30/23  9:50 AM  Result Value Ref Range   Glucose 88 70 - 99 mg/dL   BUN 12 8 - 27 mg/dL   Creatinine, Ser 2.95 0.76 - 1.27 mg/dL   eGFR 90 >28 UX/LKG/4.01   BUN/Creatinine Ratio 13 10 - 24   Sodium 142 134 - 144 mmol/L    Potassium 4.7 3.5 - 5.2 mmol/L   Chloride 103 96 - 106 mmol/L   CO2 25 20 - 29 mmol/L   Calcium  9.1 8.6 - 10.2 mg/dL   Total Protein 6.3 6.0 - 8.5 g/dL   Albumin 4.4 3.8 - 4.8 g/dL   Globulin, Total 1.9 1.5 - 4.5 g/dL   Bilirubin Total 0.5 0.0 - 1.2 mg/dL   Alkaline Phosphatase 100 44 - 121 IU/L   AST 19 0 - 40 IU/L   ALT 16 0 - 44 IU/L  Lipid Panel w/o Chol/HDL Ratio   Collection Time: 09/30/23  9:50 AM  Result Value Ref Range   Cholesterol, Total 135 100 - 199 mg/dL   Triglycerides 027 (H) 0 - 149 mg/dL   HDL 50 >25 mg/dL   VLDL Cholesterol Cal 36 5 - 40 mg/dL   LDL Chol Calc (NIH) 49 0 - 99 mg/dL      Assessment & Plan:   Problem List Items Addressed This Visit       Cardiovascular and Mediastinum   Hypertension   Chronic, ongoing.  BP at goal today.  Continue current medication regimen and collaboration with cardiology.  Recommend he monitor BP at least a few mornings a week at home and document.  DASH diet at home.  Labs today: CBC, CMP, TSH.       Relevant Orders   CBC with Differential/Platelet   Comprehensive metabolic panel with GFR   TSH  Heart failure with reduced ejection fraction (HCC)   Chronic.  Diagnosed on 06/30/22 with EF 30-35%, recent echo August 2024 similar.  Followed by cardiology.  Euvolemic.  They have grant to assist in covering Entresto  and Jardiance . Recommend: - Reminded to call for an overnight weight gain of >2 pounds or a weekly weight gain of >5 pounds - not adding salt to food and read food labels. Reviewed the importance of keeping daily sodium intake to 2000mg  daily.  - No NSAIDS        Nervous and Auditory   Frontotemporal dementia (HCC) - Primary   Chronic, progressive.  Diagnosed 04/15/22, remains stable at this time.  Family history of dementia in mother and father.  Continue collaboration with neurology as needed and will trial increasing Aricept  to 10 MG, if headaches present will return to 5 MG daily.  Continue B12 supplement.       Relevant Medications   donepezil  (ARICEPT ) 10 MG tablet   Other Relevant Orders   Vitamin B12     Other   Vitamin D  deficiency   Ongoing and taking supplement at home, continue this and check Vit D today.      Relevant Orders   VITAMIN D  25 Hydroxy (Vit-D Deficiency, Fractures)   Spinal stenosis, lumbar region, without neurogenic claudication (L3/4)   Chronic, continue collaboration with pain management and neurosurgery.  Recent notes reviewed.      Hyperlipidemia   Chronic, ongoing.  Continue Rosuvastatin  daily.  Adjust dose as needed.  Obtain labs today.      Relevant Orders   Comprehensive metabolic panel with GFR   Lipid Panel w/o Chol/HDL Ratio   History of prostate cancer   PSA today and check annually at this time.  No new symptoms.      Relevant Orders   PSA     Follow up plan: Return in about 6 months (around 10/05/2024) for HTN/HLD/HF, DEMENTIA.

## 2024-04-04 NOTE — Assessment & Plan Note (Signed)
 Chronic, continue collaboration with pain management and neurosurgery.  Recent notes reviewed.

## 2024-04-04 NOTE — Assessment & Plan Note (Signed)
Ongoing and taking supplement at home, continue this and check Vit D today. ?

## 2024-04-05 ENCOUNTER — Ambulatory Visit: Payer: Self-pay | Admitting: Nurse Practitioner

## 2024-04-05 LAB — CBC WITH DIFFERENTIAL/PLATELET
Basophils Absolute: 0.1 10*3/uL (ref 0.0–0.2)
Basos: 1 %
EOS (ABSOLUTE): 0.1 10*3/uL (ref 0.0–0.4)
Eos: 3 %
Hematocrit: 48.5 % (ref 37.5–51.0)
Hemoglobin: 15.9 g/dL (ref 13.0–17.7)
Immature Grans (Abs): 0 10*3/uL (ref 0.0–0.1)
Immature Granulocytes: 0 %
Lymphocytes Absolute: 0.8 10*3/uL (ref 0.7–3.1)
Lymphs: 21 %
MCH: 31.4 pg (ref 26.6–33.0)
MCHC: 32.8 g/dL (ref 31.5–35.7)
MCV: 96 fL (ref 79–97)
Monocytes Absolute: 0.3 10*3/uL (ref 0.1–0.9)
Monocytes: 8 %
Neutrophils Absolute: 2.5 10*3/uL (ref 1.4–7.0)
Neutrophils: 67 %
Platelets: 211 10*3/uL (ref 150–450)
RBC: 5.06 x10E6/uL (ref 4.14–5.80)
RDW: 12.9 % (ref 11.6–15.4)
WBC: 3.8 10*3/uL (ref 3.4–10.8)

## 2024-04-05 LAB — COMPREHENSIVE METABOLIC PANEL WITH GFR
ALT: 24 IU/L (ref 0–44)
AST: 24 IU/L (ref 0–40)
Albumin: 4.3 g/dL (ref 3.8–4.8)
Alkaline Phosphatase: 88 IU/L (ref 44–121)
BUN/Creatinine Ratio: 13 (ref 10–24)
BUN: 10 mg/dL (ref 8–27)
Bilirubin Total: 0.5 mg/dL (ref 0.0–1.2)
CO2: 25 mmol/L (ref 20–29)
Calcium: 9.2 mg/dL (ref 8.6–10.2)
Chloride: 103 mmol/L (ref 96–106)
Creatinine, Ser: 0.8 mg/dL (ref 0.76–1.27)
Globulin, Total: 2.1 g/dL (ref 1.5–4.5)
Glucose: 99 mg/dL (ref 70–99)
Potassium: 4.6 mmol/L (ref 3.5–5.2)
Sodium: 143 mmol/L (ref 134–144)
Total Protein: 6.4 g/dL (ref 6.0–8.5)
eGFR: 93 mL/min/{1.73_m2} (ref >59)

## 2024-04-05 LAB — LIPID PANEL W/O CHOL/HDL RATIO
Cholesterol, Total: 135 mg/dL (ref 100–199)
HDL: 54 mg/dL (ref >39)
LDL Chol Calc (NIH): 55 mg/dL (ref 0–99)
Triglycerides: 151 mg/dL — ABNORMAL HIGH (ref 0–149)
VLDL Cholesterol Cal: 26 mg/dL (ref 5–40)

## 2024-04-05 LAB — TSH: TSH: 1.75 u[IU]/mL (ref 0.450–4.500)

## 2024-04-05 LAB — VITAMIN B12: Vitamin B-12: 1039 pg/mL (ref 232–1245)

## 2024-04-05 LAB — VITAMIN D 25 HYDROXY (VIT D DEFICIENCY, FRACTURES): Vit D, 25-Hydroxy: 38.4 ng/mL (ref 30.0–100.0)

## 2024-04-05 LAB — PSA: Prostate Specific Ag, Serum: 0.2 ng/mL (ref 0.0–4.0)

## 2024-04-05 NOTE — Progress Notes (Signed)
 Contacted via MyChart   Good afternoon Christopher Huang, your labs have returned and overall are stable.  No medication changes needed.  If you get headaches increase Aricept  to 10 MG let me know and we will return to 5 MG.  Any questions? Keep being stellar!!  Thank you for allowing me to participate in your care.  I appreciate you. Kindest regards, Sydnie Sigmund

## 2024-04-13 NOTE — Progress Notes (Signed)
 Cardiology Clinic Note   Date: 04/14/2024 ID: GRIFFEY NICASIO, DOB 03/29/1951, MRN [id number]  Primary Cardiologist:  Belva Boyden, MD  Chief Complaint   Christopher Huang is a 73 y.o. male who presents to the clinic today for routine follow up.   Patient Profile   Christopher Huang is followed by Dr. Gollan for the history outlined below.      Past medical history significant for: Chronic HFrEF. Echo 07/22/2023: EF 30 to 35%.  Global hypokinesis.  Grade I DD.  Normal RV size/function.  Mild LAE.  Mild MR/AI.  Aortic valve sclerosis without stenosis. LBBB. Nuclear stress test 05/19/2022: Abnormal probably low risk myocardial perfusion stress test.  No evidence of significant ischemia or scar.  Mildly reduced LV systolic function with global hypokinesis.  Attenuation correction CT demonstrates dense calcification in the region of the LMCA.  Aortic atherosclerosis also present.  Occasional PVCs including couplets noted during stress/recovery. Coronary CTA 10/02/2022: Coronary calcium  score of 0.  Mixed plaque in proximal LAD causing minimal stenosis. Hypertension. Hyperlipidemia. Lipid panel 04/04/2024: LDL 55, HDL 54, TG 151, total 135.  In summary, patient was first evaluated by Dr. Gollan on 05/11/2022 for abnormal EKG at the request of Doran Galloway, NP.  EKG demonstrated bradycardia with LBBB.  He underwent low risk stress test as detailed above.  Follow-up echo in August 2023 demonstrated EF 30 to 35%, global hypokinesis, Grade I DD, low normal RV function, normal RV size, mild AI.  Patient was started on carvedilol  and Entresto .  Entresto  was increased in January 2024.  April 2024 patient was noted to have low BP on follow-up so spironolactone could not be added.  He was started on Jardiance .  Patient was last seen in the office by Dr. Gollan on 07/12/2023 for routine follow-up.  Patient requested to stop Entresto  and Jardiance  secondary to cost.  Entresto  was changed to valsartan .  Repeat  echo was ordered which showed continued depressed EF 30 to 35% as detailed above.     History of Present Illness    Today, patient is accompanied by his wife. He reports he is doing well. Patient denies shortness of breath, dyspnea on exertion, lower extremity edema, orthopnea or PND. No chest pain, pressure, or tightness. No palpitations.  He has not been weighing at home. He stays active going to the gym, walking, and performing back exercises he learned in PT. He performs yard work including mowing on a [employer], newspaper. He is able to do these activities without limitation. He is tolerating all of his medications. His wife mentions that she noticed he will sometimes get choked up when eating causing him to cough. It typically occurs with crunchy foods like peanuts and popcorn. He is able to swallow liquids without difficulty. Suggested they follow up with PCP regarding this. Recommended keeping a food diary to track what foods are giving him issues.     ROS: All other systems reviewed and are otherwise negative except as noted in History of Present Illness.  EKGs/Labs Reviewed    EKG Interpretation Date/Time:  Friday Apr 14 2024 13:35:50 EDT Ventricular Rate:  61 PR Interval:  208 QRS Duration:  152 QT Interval:  452 QTC Calculation: 455 R Axis:   35  Text Interpretation: Normal sinus rhythm with sinus arrhythmia Left bundle branch block When compared with ECG of 12-Jul-2023 08:33, Premature ventricular complexes are no longer Present Confirmed by Morey Ar [phone]) on 04/14/2024 1:42:20 PM   04/04/2024: ALT 24; AST  24; BUN 10; Creatinine, Ser 0.80; Potassium 4.6; Sodium 143   04/04/2024: Hemoglobin 15.9; WBC 3.8   04/04/2024: TSH 1.750    Physical Exam    VS:  BP 130/60 (BP Location: Left Arm)   Pulse 61   Ht 6\' 2"  (1.88 m)   Wt 217 lb 9.6 oz (98.7 kg)   SpO2 98%   BMI 27.94 kg/m  , BMI Body mass index is 27.94 kg/m.  GEN: Well nourished, well developed, in no acute  distress. Neck: No JVD or carotid bruits. Cardiac:  RRR. No murmurs. No rubs or gallops.   Respiratory:  Respirations regular and unlabored. Clear to auscultation without rales, wheezing or rhonchi. GI: Soft, nontender, nondistended. Extremities: Radials/DP/PT 2+ and equal bilaterally. No clubbing or cyanosis. No edema.  Skin: Warm and dry, no rash. Neuro: Strength intact.  Assessment & Plan   Chronic HFrEF Echo August 2024 showed EF 30 to 35%, global hypokinesis, Grade I DD, normal RV size/function, mild LAE, mild MR/AI.  Patient is doing well. He denies lower extremity edema, abdominal bloating or distention, DOE, orthopnea or PND. He is able to stay active without difficulty.  Euvolemic and well compensated on exam. - Continue carvedilol , Entresto , Jardiance .  Nonobstructive CAD/LBBB Coronary CTA November 2023 showed calcium  score of 0 with mixed plaque to proximal LAD causing minimal stenosis.  Patient denies chest pain, pressure or tightness. He has been going to the gym and walking a bit. He has also been performing back exercises he learned in PT. Patient has a history of low BP. Patient and wife would like to defer addition of spironolactone.  - Continue carvedilol , aspirin , rosuvastatin .  Hypertension BP today 130/60. No report of headaches or dizziness.  -Continue Entresto , carvedilol .   Hyperlipidemia LDL 55 May 2025, at goal. - Continue rosuvastatin .  Disposition: Return in 6 months or sooner as needed.          Signed, Lonell Rives. Azalya Galyon, DNP, NP-C

## 2024-04-14 ENCOUNTER — Ambulatory Visit: Attending: Student | Admitting: Student

## 2024-04-14 ENCOUNTER — Encounter: Payer: Self-pay | Admitting: Student

## 2024-04-14 ENCOUNTER — Encounter: Payer: Self-pay | Admitting: Nurse Practitioner

## 2024-04-14 VITALS — BP 130/60 | HR 61 | Ht 74.0 in | Wt 217.6 lb

## 2024-04-14 DIAGNOSIS — I447 Left bundle-branch block, unspecified: Secondary | ICD-10-CM | POA: Diagnosis not present

## 2024-04-14 DIAGNOSIS — E782 Mixed hyperlipidemia: Secondary | ICD-10-CM | POA: Diagnosis not present

## 2024-04-14 DIAGNOSIS — I2510 Atherosclerotic heart disease of native coronary artery without angina pectoris: Secondary | ICD-10-CM | POA: Diagnosis not present

## 2024-04-14 DIAGNOSIS — I5022 Chronic systolic (congestive) heart failure: Secondary | ICD-10-CM

## 2024-04-14 DIAGNOSIS — I10 Essential (primary) hypertension: Secondary | ICD-10-CM | POA: Diagnosis not present

## 2024-04-14 MED ORDER — DONEPEZIL HCL 5 MG PO TABS: 5.0000 mg | ORAL_TABLET | Freq: Every day | ORAL | 3 refills | Status: AC

## 2024-04-14 NOTE — Patient Instructions (Signed)
 Medication Instructions:  Your Physician recommend you continue on your current medication as directed.    *If you need a refill on your cardiac medications before your next appointment, please call your pharmacy*  Lab Work: None ordered at this time  If you have labs (blood work) drawn today and your tests are completely normal, you will receive your results only by: MyChart Message (if you have MyChart) OR A paper copy in the mail If you have any lab test that is abnormal or we need to change your treatment, we will call you to review the results.  Testing/Procedures: None ordered at this time   Follow-Up: At [facility], you and your health needs are our priority.  As part of our continuing mission to provide you with exceptional heart care, our providers are all part of one team.  This team includes your primary Cardiologist (physician) and Advanced Practice Providers or APPs (Physician Assistants and Nurse Practitioners) who all work together to provide you with the care you need, when you need it.  Your next appointment:   6 month(s)  Provider:   You may see Timothy Gollan, MD or one of the following Advanced Practice Providers on your designated Care Team:   Laneta Pintos, NP Gildardo Labrador, PA-C Varney Gentleman, PA-C Cadence Green, PA-C Ronald Cockayne, NP Morey Ar, NP    We recommend signing up for the patient portal called "MyChart".  Sign up information is provided on this After Visit Summary.  MyChart is used to connect with patients for Virtual Visits (Telemedicine).  Patients are able to view lab/test results, encounter notes, upcoming appointments, etc.  Non-urgent messages can be sent to your provider as well.   To learn more about what you can do with MyChart, go to [email or web address].

## 2024-06-08 ENCOUNTER — Other Ambulatory Visit: Payer: Self-pay | Admitting: Nurse Practitioner

## 2024-06-08 DIAGNOSIS — J302 Other seasonal allergic rhinitis: Secondary | ICD-10-CM

## 2024-06-09 NOTE — Telephone Encounter (Signed)
 Requested Prescriptions  Pending Prescriptions Disp Refills   fluticasone  (FLONASE ) 50 MCG/ACT nasal spray [Pharmacy Med Name: FLUTICASONE  PROP 50 MCG SPRAY] 48 mL 3    Sig: PLACE 2 SPRAYS INTO EACH NOSTRIL DAILY.     Ear, Nose, and Throat: Nasal Preparations - Corticosteroids Passed - 06/09/2024 11:32 AM      Passed - Valid encounter within last 12 months    Recent Outpatient Visits           2 months ago Frontotemporal dementia (HCC)   [place] [facility] [place], Melanie T, NP   5 months ago Frontotemporal dementia [facility])   [place] [facility] [place], Melanie DASEN, NP

## 2024-08-31 DIAGNOSIS — D485 Neoplasm of uncertain behavior of skin: Secondary | ICD-10-CM | POA: Diagnosis not present

## 2024-08-31 DIAGNOSIS — D0462 Carcinoma in situ of skin of left upper limb, including shoulder: Secondary | ICD-10-CM | POA: Diagnosis not present

## 2024-08-31 DIAGNOSIS — D225 Melanocytic nevi of trunk: Secondary | ICD-10-CM | POA: Diagnosis not present

## 2024-08-31 DIAGNOSIS — Z85828 Personal history of other malignant neoplasm of skin: Secondary | ICD-10-CM | POA: Diagnosis not present

## 2024-08-31 DIAGNOSIS — D2272 Melanocytic nevi of left lower limb, including hip: Secondary | ICD-10-CM | POA: Diagnosis not present

## 2024-08-31 DIAGNOSIS — L570 Actinic keratosis: Secondary | ICD-10-CM | POA: Diagnosis not present

## 2024-08-31 DIAGNOSIS — D2261 Melanocytic nevi of right upper limb, including shoulder: Secondary | ICD-10-CM | POA: Diagnosis not present

## 2024-08-31 DIAGNOSIS — D2262 Melanocytic nevi of left upper limb, including shoulder: Secondary | ICD-10-CM | POA: Diagnosis not present

## 2024-09-16 NOTE — Progress Notes (Unsigned)
 Cardiology Clinic Note   Date: 09/20/2024 ID: ABDIRAHMAN CHITTUM, DOB 08-30-1951, MRN [id number]  Primary Cardiologist:  Evalene Lunger, MD  Chief Complaint   Christopher Huang is a 73 y.o. male who presents to the clinic today for routine follow up.   Patient Profile   Christopher Huang is followed by Dr. Gollan for the history outlined below.       Past medical history significant for: Chronic HFrEF. Echo 07/22/2023: EF 30 to 35%.  Global hypokinesis.  Grade I DD.  Normal RV size/function.  Mild LAE.  Mild MR/AI.  Aortic valve sclerosis without stenosis. LBBB. Nuclear stress test 05/19/2022: Abnormal probably low risk myocardial perfusion stress test.  No evidence of significant ischemia or scar.  Mildly reduced LV systolic function with global hypokinesis.  Attenuation correction CT demonstrates dense calcification in the region of the LMCA.  Aortic atherosclerosis also present.  Occasional PVCs including couplets noted during stress/recovery. Coronary CTA 10/02/2022: Coronary calcium  score of 0.  Mixed plaque in proximal LAD causing minimal stenosis. Hypertension. Hyperlipidemia. Lipid panel 04/04/2024: LDL 55, HDL 54, TG 151, total 135.  In summary, patient was first evaluated by Dr. Gollan on 05/11/2022 for abnormal EKG at the request of Melanie Polio, NP.  EKG demonstrated bradycardia with LBBB.  He underwent low risk stress test as detailed above.  Follow-up echo in August 2023 demonstrated EF 30 to 35%, global hypokinesis, Grade I DD, low normal RV function, normal RV size, mild AI.  Patient was started on carvedilol  and Entresto .  Entresto  was increased in January 2024.  April 2024 patient was noted to have low BP on follow-up so spironolactone could not be added.  He was started on Jardiance .  Patient was seen on 07/12/2023 for routine follow-up.  Patient requested to stop Entresto  and Jardiance  secondary to cost.  Entresto  was changed to valsartan .  Repeat echo was ordered which showed  continued depressed EF 30 to 35% as detailed above.  He was subsequently restarted on Entresto  and Jardiance .  Patient was last seen in the office by me on 04/14/2024 for routine follow-up.  He reported he had not been weighing daily at home.  He denied shortness of breath, lower extremity edema, orthopnea or PND.  He was staying active going to the gym, walking and performing back exercises he learned in PT.  Deferred addition of spironolactone.     History of Present Illness    Today, patient is accompanied by his wife. He reports he is doing well. Patient denies shortness of breath, dyspnea on exertion, lower extremity edema, orthopnea or PND. He is weighing infrequently at home.  No chest pain, pressure, or tightness. No palpitations. He walks for exercise and occasionally goes to the gym with good tolerance. He is somewhat limited with walking at times secondary to back pain.     ROS: All other systems reviewed and are otherwise negative except as noted in History of Present Illness.  EKGs/Labs Reviewed    EKG Interpretation Date/Time:  Wednesday September 20 2024 10:58:16 EDT Ventricular Rate:  60 PR Interval:  212 QRS Duration:  164 QT Interval:  460 QTC Calculation: 460 R Axis:   -19  Text Interpretation: Sinus rhythm with 1st degree A-V block with Premature atrial complexes in a pattern of bigeminy Left bundle branch block When compared with ECG of 14-Apr-2024 13:35, Premature atrial complexes are now Present T wave inversion no longer evident in Inferior leads T wave inversion less evident in Lateral leads  Confirmed by Loistine Sober [phone]) on 09/20/2024 11:14:27 AM   04/04/2024: ALT 24; AST 24; BUN 10; Creatinine, Ser 0.80; Potassium 4.6; Sodium 143   04/04/2024: Hemoglobin 15.9; WBC 3.8   04/04/2024: TSH 1.750    Physical Exam    VS:  BP 112/68 (BP Location: Left Arm, Patient Position: Sitting, Cuff Size: Normal)   Pulse 60   Wt 219 lb (99.3 kg)   SpO2 96%   BMI  28.12 kg/m  , BMI Body mass index is 28.12 kg/m.  GEN: Well nourished, well developed, in no acute distress. Neck: No JVD or carotid bruits. Cardiac:  RRR.  No murmur. No rubs or gallops.   Respiratory:  Respirations regular and unlabored. Clear to auscultation without rales, wheezing or rhonchi. GI: Soft, nontender, nondistended. Extremities: Radials/DP/PT 2+ and equal bilaterally. No clubbing or cyanosis. No edema.  Skin: Warm and dry, no rash. Neuro: Strength intact.  Assessment & Plan   Chronic HFrEF Echo August 2024 showed EF 30 to 35%, global hypokinesis, Grade I DD, normal RV size/function, mild LAE, mild MR/AI.  Patient denies lower extremity edema, abdominal fullness, shortness of breath, orthopnea or PND.  Euvolemic and well compensated on exam.  - Continue carvedilol , Entresto , Jardiance .GDMT limited secondary to soft BP.  - Consider repeat echo at follow up.    Nonobstructive CAD/LBBB Coronary CTA November 2023 showed calcium  score of 0 with mixed plaque to proximal LAD causing minimal stenosis.  Patient denies chest pain. He is active walking for exercise and occasionally going to the gym with good tolerance.  - Continue carvedilol , aspirin , rosuvastatin .   Hypertension BP today 112/68. No report of headaches or dizziness.  -Continue Entresto , carvedilol .    Hyperlipidemia LDL 55 May 2025, at goal. - Continue rosuvastatin .  Disposition: Return in 6 months or sooner as needed.          Signed, Sober HERO. Kahliyah Dick, DNP, NP-C

## 2024-09-18 DIAGNOSIS — H524 Presbyopia: Secondary | ICD-10-CM | POA: Diagnosis not present

## 2024-09-18 DIAGNOSIS — H5052 Exophoria: Secondary | ICD-10-CM | POA: Diagnosis not present

## 2024-09-18 DIAGNOSIS — H43813 Vitreous degeneration, bilateral: Secondary | ICD-10-CM | POA: Diagnosis not present

## 2024-09-18 DIAGNOSIS — Z961 Presence of intraocular lens: Secondary | ICD-10-CM | POA: Diagnosis not present

## 2024-09-18 DIAGNOSIS — H35373 Puckering of macula, bilateral: Secondary | ICD-10-CM | POA: Diagnosis not present

## 2024-09-18 DIAGNOSIS — R519 Headache, unspecified: Secondary | ICD-10-CM | POA: Diagnosis not present

## 2024-09-20 ENCOUNTER — Encounter: Payer: Self-pay | Admitting: Student

## 2024-09-20 ENCOUNTER — Ambulatory Visit: Attending: Student | Admitting: Student

## 2024-09-20 VITALS — BP 112/68 | HR 60 | Wt 219.0 lb

## 2024-09-20 DIAGNOSIS — I5022 Chronic systolic (congestive) heart failure: Secondary | ICD-10-CM | POA: Diagnosis not present

## 2024-09-20 DIAGNOSIS — I10 Essential (primary) hypertension: Secondary | ICD-10-CM

## 2024-09-20 DIAGNOSIS — I2510 Atherosclerotic heart disease of native coronary artery without angina pectoris: Secondary | ICD-10-CM | POA: Diagnosis not present

## 2024-09-20 DIAGNOSIS — I447 Left bundle-branch block, unspecified: Secondary | ICD-10-CM

## 2024-09-20 DIAGNOSIS — E785 Hyperlipidemia, unspecified: Secondary | ICD-10-CM | POA: Diagnosis not present

## 2024-09-20 NOTE — Patient Instructions (Signed)
 Medication Instructions:   None ordered at this time   *If you need a refill on your cardiac medications before your next appointment, please call your pharmacy*  Lab Work:  None ordered at this time   If you have labs (blood work) drawn today and your tests are completely normal, you will receive your results only by:  MyChart Message (if you have MyChart) OR  A paper copy in the mail If you have any lab test that is abnormal or we need to change your treatment, we will call you to review the results.  Testing/Procedures:  None ordered at this time   Referrals:  None ordered at this time   Follow-Up:  At [facility], you and your health needs are our priority.  As part of our continuing mission to provide you with exceptional heart care, our providers are all part of one team.  This team includes your primary Cardiologist (physician) and Advanced Practice Providers or APPs (Physician Assistants and Nurse Practitioners) who all work together to provide you with the care you need, when you need it.  Your next appointment:   5 - 6 month(s)  Provider:    Evalene Lunger, MD or Barnie Hila, NP    We recommend signing up for the patient portal called MyChart.  Sign up information is provided on this After Visit Summary.  MyChart is used to connect with patients for Virtual Visits (Telemedicine).  Patients are able to view lab/test results, encounter notes, upcoming appointments, etc.  Non-urgent messages can be sent to your provider as well.   To learn more about what you can do with MyChart, go to [email or web address].

## 2024-09-21 ENCOUNTER — Other Ambulatory Visit: Payer: Self-pay | Admitting: Cardiovascular Disease

## 2024-09-23 ENCOUNTER — Other Ambulatory Visit: Payer: Self-pay | Admitting: Cardiovascular Disease

## 2024-09-25 ENCOUNTER — Emergency Department
Admission: EM | Admit: 2024-09-25 | Discharge: 2024-09-25 | Disposition: A | Discharge status: Home / Self Care | Attending: Emergency Medicine | Admitting: Emergency Medicine

## 2024-09-25 ENCOUNTER — Other Ambulatory Visit: Payer: Self-pay

## 2024-09-25 ENCOUNTER — Ambulatory Visit: Payer: Self-pay

## 2024-09-25 ENCOUNTER — Encounter: Payer: Self-pay | Admitting: Emergency Medicine

## 2024-09-25 ENCOUNTER — Emergency Department

## 2024-09-25 DIAGNOSIS — S60519A Abrasion of unspecified hand, initial encounter: Secondary | ICD-10-CM | POA: Insufficient documentation

## 2024-09-25 DIAGNOSIS — S0081XA Abrasion of other part of head, initial encounter: Secondary | ICD-10-CM | POA: Diagnosis not present

## 2024-09-25 DIAGNOSIS — R5383 Other fatigue: Secondary | ICD-10-CM | POA: Diagnosis not present

## 2024-09-25 DIAGNOSIS — W19XXXA Unspecified fall, initial encounter: Secondary | ICD-10-CM

## 2024-09-25 DIAGNOSIS — T07XXXA Unspecified multiple injuries, initial encounter: Secondary | ICD-10-CM

## 2024-09-25 DIAGNOSIS — W1809XA Striking against other object with subsequent fall, initial encounter: Secondary | ICD-10-CM | POA: Insufficient documentation

## 2024-09-25 DIAGNOSIS — M47812 Spondylosis without myelopathy or radiculopathy, cervical region: Secondary | ICD-10-CM | POA: Diagnosis not present

## 2024-09-25 DIAGNOSIS — Z8546 Personal history of malignant neoplasm of prostate: Secondary | ICD-10-CM | POA: Diagnosis not present

## 2024-09-25 DIAGNOSIS — F0390 Unspecified dementia without behavioral disturbance: Secondary | ICD-10-CM | POA: Insufficient documentation

## 2024-09-25 DIAGNOSIS — S80211A Abrasion, right knee, initial encounter: Secondary | ICD-10-CM | POA: Diagnosis not present

## 2024-09-25 DIAGNOSIS — S0083XA Contusion of other part of head, initial encounter: Secondary | ICD-10-CM | POA: Diagnosis not present

## 2024-09-25 DIAGNOSIS — I110 Hypertensive heart disease with heart failure: Secondary | ICD-10-CM | POA: Diagnosis not present

## 2024-09-25 DIAGNOSIS — I509 Heart failure, unspecified: Secondary | ICD-10-CM | POA: Diagnosis not present

## 2024-09-25 DIAGNOSIS — S0993XA Unspecified injury of face, initial encounter: Secondary | ICD-10-CM | POA: Diagnosis present

## 2024-09-25 DIAGNOSIS — Z043 Encounter for examination and observation following other accident: Secondary | ICD-10-CM | POA: Diagnosis not present

## 2024-09-25 DIAGNOSIS — S80212A Abrasion, left knee, initial encounter: Secondary | ICD-10-CM | POA: Diagnosis not present

## 2024-09-25 NOTE — ED Triage Notes (Addendum)
 Pt in via POV w/ spouse, wife reports fall in driveway on Saturday, striking face on concrete.  Wife expresses concern that he is more lethargic today and woke up w/ a headache.  Declines LOC at time of fall, declines taking any blood thinners.  Patient alert, no lethargy noted at this time, oriented x 3 which is baseline for patient.  NAD noted at this time.    Wife reports Dementia at baseline.

## 2024-09-25 NOTE — Discharge Instructions (Signed)
 Follow-up with your primary care provider if any continued problems or concerns.  Clean the areas on the face and palm of the hand and other abrasions with mild soap and water and watch for any signs of infection.  You may apply a very small amount of bacitracin or Neosporin  to the facial areas to prevent dryness and cracking as needed for comfort.  Return to the emergency department or contact your primary care provider if any signs or concerns of infection to his face.  It is normal to be sore and stiff for approximately 4 to 5 days after a fall.

## 2024-09-25 NOTE — Telephone Encounter (Signed)
  FYI Only or Action Required?: FYI only for provider: ED advised. REFUSED- but will go to Urgent Care  Patient was last seen in primary care on 04/04/2024 by Cannady, Jolene T, NP.  Called Nurse Triage reporting Fall.  Symptoms began several days ago. 2 days ago  Interventions attempted: Rest, hydration, or home remedies.  Symptoms are: gradually worsening. Abraisions looks worse per wife and patient seems more tired than usual  Triage Disposition: Go to ED Now (or PCP Triage)  Patient/caregiver understands and will follow disposition?: YesCopied from CRM [id number]. Topic: Clinical - Red Word Triage >> Sep 25, 2024  9:32 AM Leonette SQUIBB wrote: Red Word that prompted transfer to Nurse Triage: pt fell Saturday in the driveway.  Hit is face.  She wants him to be seen.  Hurt his nose pretty bad Reason for Disposition  Patient sounds very sick or weak to the triager  Additional Information  Negative: [1] MODERATE weakness (e.g., interferes with work, school, normal activities) AND [2] new-onset or getting worse    Facial pain with abriasion and feels lethargic  Answer Assessment - Initial Assessment Questions 1. MECHANISM: How did the fall happen?     Unwitnessed fall. But wife says that he stepped off the driveway and fell on the drive. He told wife that he didn't feel dizzy at the time of the fall  2. DOMESTIC VIOLENCE AND ELDER ABUSE SCREENING: Did you fall because someone pushed you or tried to hurt you? If Yes, ask: Are you safe now?     No  3. ONSET: When did the fall happen? (e.g., minutes, hours, or days ago)     2 days ago  4. LOCATION: What part of the body hit the ground? (e.g., back, buttocks, head, hips, knees, hands, head, stomach)     Face  5. INJURY: Did you hurt (injure) yourself when you fell? If Yes, ask: What did you injure? Tell me more about this? (e.g., body area; type of injury; pain severity)     Yes, landing on face, broke his glasses and hurt his  nose; both knees and hands  6. PAIN: Is there any pain? If Yes, ask: How bad is the pain? (e.g., Scale 0-10; or none, mild,      Moderate pain  7. SIZE: For cuts, bruises, or swelling, ask: How large is it? (e.g., inches or centimeters)      Cuts and abrasions on his face. Nose is really scraped up along with chin  8. PREGNANCY: Is there any chance you are pregnant? When was your last menstrual period?     N/a  9. OTHER SYMPTOMS: Do you have any other symptoms? (e.g., dizziness, fever, weakness; new-onset or worsening).      No  10. CAUSE: What do you think caused the fall (or falling)? (e.g., dizzy spell, tripped)       Tripped and fell  Protocols used: Falls and [facility]

## 2024-09-25 NOTE — ED Provider Notes (Signed)
 [facility] Provider Note    Event Date/Time   First MD Initiated Contact with Patient 09/25/24 1150     (approximate)   History   Fall   HPI  Christopher Huang is a 73 y.o. male   presents to the ED by wife with concerns of patient being more lethargic today than he was yesterday after his fall.  Patient states that he has a headache.  Patient fell in the driveway and landed on his face.  Patient's wife denies any blood thinners, nausea, vomiting and patient continues to answer questions appropriately.  Wife is concerned as he has dementia and would like to have him evaluated.  She reports that to her knowledge his dementia stays at baseline.  In addition he has history of hypertension, left bundle branch block, heart failure with reduced ejection fraction, history of prostate cancer, lumbar degenerative disc disease, chronic left knee pain.      Physical Exam   Triage Vital Signs: ED Triage Vitals  Encounter Vitals Group     BP 09/25/24 1129 (!) 147/82     Girls Systolic BP Percentile --      Girls Diastolic BP Percentile --      Boys Systolic BP Percentile --      Boys Diastolic BP Percentile --      Pulse Rate 09/25/24 1129 63     Resp 09/25/24 1129 16     Temp 09/25/24 1129 97.8 F (36.6 C)     Temp Source 09/25/24 1129 Oral     SpO2 09/25/24 1129 96 %     Weight 09/25/24 1130 216 lb (98 kg)     Height 09/25/24 1130 6' 2 (1.88 m)     Head Circumference --      Peak Flow --      Pain Score 09/25/24 1129 0     Pain Loc --      Pain Education --      Exclude from Growth Chart --     Most recent vital signs: Vitals:   09/25/24 1129  BP: (!) 147/82  Pulse: 63  Resp: 16  Temp: 97.8 F (36.6 C)  SpO2: 96%     General: Awake, no distress.  Alert, pleasant, talkative. CV:  Good peripheral perfusion.  Heart regular rate and rhythm. Resp:  Normal effort.  Lungs are clear bilaterally.  No tenderness on palpation of the ribs bilaterally or  anteriorly. Abd:  No distention.  Soft, nontender, bowel sounds normoactive x 4 quadrants. Other:  Patient is able to move upper and lower extremities without any difficulty.  There is some superficial abrasions noted to his knees and the palm of his hand.  No foreign body or bleeding noted.   ED Results / Procedures / Treatments   Labs (all labs ordered are listed, but only abnormal results are displayed) Labs Reviewed - No data to display   RADIOLOGY  CT maxillofacial without contrast per radiology is negative for facial fractures. CT cervical spine without contrast per radiology is negative for acute cervical fracture. Multilevel spondylosis and facet hypertrophy as discussed in the radiology report. CT head without contrast per radiology is negative for acute intracranial findings.    PROCEDURES:  Critical Care performed:   Procedures   MEDICATIONS ORDERED IN ED: Medications - No data to display   IMPRESSION / MDM / ASSESSMENT AND PLAN / ED COURSE  I reviewed the triage vital signs and the nursing notes.  Differential diagnosis includes, but is not limited to, head injury, facial fracture, cervical fracture, subluxation, multiple abrasions, foreign body, intracranial bleeding secondary to trauma, multiple contusions secondary to fall.  73 year old male presents to the ED with family members after patient fell in the driveway on Saturday striking his face on the concrete.  There was no loss of consciousness however wife is concerned when he woke up this morning with a headache.  CT head, cervical spine and maxillofacial were reassuring to the patient's wife.  We discussed care for his multiple abrasions to his face, hand and knee.  Watch for signs of infection and clean daily with mild soap and water.  Follow-up with his PCP or return to the emergency department if any severe worsening of his symptoms or urgent concerns.       Patient's presentation is most consistent  with acute presentation with potential threat to life or bodily function.  FINAL CLINICAL IMPRESSION(S) / ED DIAGNOSES   Final diagnoses:  Contusion of face, initial encounter  Abrasions of multiple sites  Fall, initial encounter     Rx / DC Orders   ED Discharge Orders     None        Note:  This document was prepared using Dragon voice recognition software and may include unintentional dictation errors.   Saunders Shona CROME, PA-C 09/25/24 1516    Arlander Charleston, MD 09/25/24 1534

## 2024-10-01 NOTE — Patient Instructions (Signed)
Be Involved in Caring For Your Health:  Taking Medications When medications are taken as directed, they can greatly improve your health. But if they are not taken as prescribed, they may not work. In some cases, not taking them correctly can be harmful. To help ensure your treatment remains effective and safe, understand your medications and how to take them. Bring your medications to each visit for review by your provider.  Your lab results, notes, and after visit summary will be available on My Chart. We strongly encourage you to use this feature. If lab results are abnormal the clinic will contact you with the appropriate steps. If the clinic does not contact you assume the results are satisfactory. You can always view your results on My Chart. If you have questions regarding your health or results, please contact the clinic during office hours. You can also ask questions on My Chart.  We at [facility] are grateful that you chose [place] to provide your care. We strive to provide evidence-based and compassionate care and are always looking for feedback. If you get a survey from the clinic please complete this so we can hear your opinions.  Memory Compensation Strategies  Use "WARM" strategy.  W= write it down  A= associate it  R= repeat it  M= make a mental note  2.   You can keep a Glass blower/designer.  Use a 3-ring notebook with sections for the following: calendar, important names and phone numbers,  medications, doctors' names/phone numbers, lists/reminders, and a section to journal what you did  each day.   3.    Use a calendar to write appointments down.  4.    Write yourself a schedule for the day.  This can be placed on the calendar or in a separate section of the Memory Notebook.  Keeping a  regular schedule can help memory.  5.    Use medication organizer with sections for each day or morning/evening pills.  You may need help loading it  6.    Keep a basket, or pegboard  by the door.  Place items that you need to take out with you in the basket or on the pegboard.  You may also want to  include a message board for reminders.  7.    Use sticky notes.  Place sticky notes with reminders in a place where the task is performed.  For example: " turn off the  stove" placed by the stove, "lock the door" placed on the door at eye level, " take your medications" on  the bathroom mirror or by the place where you normally take your medications.  8.    Use alarms/timers.  Use while cooking to remind yourself to check on food or as a reminder to take your medicine, or as a  reminder to make a call, or as a reminder to perform another task, etc.

## 2024-10-05 ENCOUNTER — Encounter: Payer: Self-pay | Admitting: Nurse Practitioner

## 2024-10-05 ENCOUNTER — Ambulatory Visit (INDEPENDENT_AMBULATORY_CARE_PROVIDER_SITE_OTHER): Admitting: Nurse Practitioner

## 2024-10-05 VITALS — BP 123/66 | HR 50 | Temp 98.7°F | Ht 74.0 in | Wt 216.0 lb

## 2024-10-05 DIAGNOSIS — G3109 Other frontotemporal dementia: Secondary | ICD-10-CM

## 2024-10-05 DIAGNOSIS — E782 Mixed hyperlipidemia: Secondary | ICD-10-CM | POA: Diagnosis not present

## 2024-10-05 DIAGNOSIS — I5020 Unspecified systolic (congestive) heart failure: Secondary | ICD-10-CM | POA: Diagnosis not present

## 2024-10-05 DIAGNOSIS — I10 Essential (primary) hypertension: Secondary | ICD-10-CM | POA: Diagnosis not present

## 2024-10-05 DIAGNOSIS — F0280 Dementia in other diseases classified elsewhere without behavioral disturbance: Secondary | ICD-10-CM

## 2024-10-05 MED ORDER — LEVOCETIRIZINE DIHYDROCHLORIDE 5 MG PO TABS: 5.0000 mg | ORAL_TABLET | Freq: Every evening | ORAL | 4 refills | Status: AC

## 2024-10-05 NOTE — Progress Notes (Addendum)
 BP 123/66   Pulse (!) 50   Temp 98.7 F (37.1 C) (Oral)   Ht 6' 2 (1.88 m)   Wt 216 lb (98 kg)   SpO2 96%   BMI 27.73 kg/m    Subjective:    Patient ID: Christopher Huang, male    DOB: 06-May-1951, 73 y.o.   MRN: [id number]  HPI: JHONATHAN DESROCHES is a 73 y.o. male  Chief Complaint  Patient presents with   Hyperlipidemia   Hypertension   HYPERTENSION / HYPERLIPIDEMIA/HF Continues Carvedilol , Rosuvastatin , Entresto , and Jardiance .  Last echo was in August 2024 with EF 30-35%. Saw cardiology last on 09/20/24, they want to perform echo at his next visit. His weight has been staying stable at home. Satisfied with current treatment? yes Duration of hypertension: chronic BP monitoring frequency: not checking BP range:  BP medication side effects: no Duration of hyperlipidemia: chronic Cholesterol medication side effects: no Cholesterol supplements: none  Medication compliance: good compliance Aspirin : yes Recent stressors: no Recurrent headaches: no Visual changes: no Palpitations: no Dyspnea: no Chest pain: no Lower extremity edema: no Dizzy/lightheaded: no   DEMENTIA: Takes Aricept  5 MG daily - did not tolerate 10 MG.  Initial presentation 03/27/22 with imaging performed 04/15/22 noting asymmetric left temporal lobe atrophy, ?semantic dementia. Saw neurology 05/05/22, Dr. Rush, to continue on Donepezil  and perform neuropsychological testing -- which they chose not to attain.  They do not wish to return to neurology at this time.  Wife reports good days and bad days.  Continues to drive to certain areas he knows best, only two places in town ([place] and [place]). No behaviors or issues with incontinence.  He did fall a week ago last Saturday. Was seen in ER for this. It was in the driveway of their home, lost his footing. Last saw neurosurgery 12/02/23 for back surgery history and pain clinic for injection 07/22/23. Takes Vitamin D  daily. Wife ensures he gets out for walks often  during the week.  HISTORY: Both of his parents had mild dementia, both lived into upper 9's.  History of back surgery in March 2022.  Date of diagnosis: 04/15/22 Last imaging: 04/15/22 Neurology visits: 05/05/22 Current memory care medications: Aricept  Medication compliance: good Family support: present with wife Living situation: lives with wife Recent Falls: none Incontinent/Continent: continent  Meal intake: no changes, eats well Behaviors: none Sleep pattern: no issues    10/05/2024    9:03 AM 03/28/2024    4:01 PM 12/31/2023    9:58 AM 03/15/2023    1:48 PM 03/27/2022    8:22 AM  6CIT Screen  What Year? 4 points 4 points 0 points 0 points 0 points  What month? 3 points 3 points 3 points 3 points 0 points  What time? 0 points 0 points 0 points 3 points 0 points  Count back from 20 0 points 0 points 0 points 0 points 0 points  Months in reverse 4 points 4 points 4 points 4 points 4 points  Repeat phrase 10 points 10 points 10 points 10 points 10 points  Total Score 21 points 21 points 17 points 20 points 14 points      10/05/2024    8:55 AM 04/04/2024    8:43 AM 03/28/2024    3:57 PM 12/31/2023    9:27 AM 09/30/2023    9:00 AM  Depression screen PHQ 2/9  Decreased Interest 1 1 0 0 1  Down, Depressed, Hopeless 0 0 0 0 0  PHQ -  2 Score 1 1 0 0 1  Altered sleeping 0 0 0 0 0  Tired, decreased energy 1 0 0 0 1  Change in appetite 0 0 0 0 0  Feeling bad or failure about yourself  0 0 0 0 0  Trouble concentrating 0 1 0 0 0  Moving slowly or fidgety/restless 1 0 0 0 0  Suicidal thoughts 0 0 0 0 0  PHQ-9 Score 3 2  0  0  2   Difficult doing work/chores Not difficult at all Not difficult at all Not difficult at all Not difficult at all Not difficult at all     Data saved with a previous flowsheet row definition      10/05/2024    8:55 AM 04/04/2024    8:43 AM 12/31/2023    9:27 AM 09/30/2023    9:00 AM  GAD 7 : Generalized Anxiety Score  Nervous, Anxious, on Edge 0 1 0 0   Control/stop worrying 0 1 0 0  Worry too much - different things 1 0 0 0  Trouble relaxing 0 1 0 0  Restless 0 0 0 0  Easily annoyed or irritable 0 1 0 0  Afraid - awful might happen 0 0 0 0  Total GAD 7 Score 1 4 0 0  Anxiety Difficulty Not difficult at all Not difficult at all Not difficult at all Not difficult at all   Relevant past medical, surgical, family and social history reviewed and updated as indicated. Interim medical history since our last visit reviewed. Allergies and medications reviewed and updated.  Review of Systems  Constitutional:  Negative for activity change, appetite change, diaphoresis, fatigue and fever.  Respiratory:  Negative for cough, chest tightness, shortness of breath and wheezing.   Cardiovascular:  Negative for chest pain, palpitations and leg swelling.  Gastrointestinal: Negative.   Endocrine: Negative.   Neurological:  Negative for dizziness, syncope, weakness, light-headedness, numbness and headaches.  Psychiatric/Behavioral: Negative.  Negative for decreased concentration, self-injury, sleep disturbance and suicidal ideas. The patient is not nervous/anxious.    Per HPI unless specifically indicated above     Objective:    BP 123/66   Pulse (!) 50   Temp 98.7 F (37.1 C) (Oral)   Ht 6' 2 (1.88 m)   Wt 216 lb (98 kg)   SpO2 96%   BMI 27.73 kg/m   Wt Readings from Last 3 Encounters:  10/05/24 216 lb (98 kg)  09/25/24 216 lb (98 kg)  09/20/24 219 lb (99.3 kg)    Physical Exam Vitals and nursing note reviewed.  Constitutional:      General: He is awake. He is not in acute distress.    Appearance: Normal appearance. He is well-developed and well-groomed. He is not ill-appearing or toxic-appearing.  HENT:     Head: Normocephalic and atraumatic.     Right Ear: Hearing and external ear normal. No drainage.     Left Ear: Hearing and external ear normal. No drainage.  Eyes:     General: Lids are normal.        Right eye: No discharge.         Left eye: No discharge.     Conjunctiva/sclera: Conjunctivae normal.     Pupils: Pupils are equal, round, and reactive to light.  Neck:     Thyroid : No thyromegaly.     Vascular: No carotid bruit.  Cardiovascular:     Rate and Rhythm: Regular rhythm. Bradycardia present.  Heart sounds: Normal heart sounds, S1 normal and S2 normal. No murmur heard.    No gallop.     Comments: Occasional extra beats noted on auscultation. Pulmonary:     Effort: Pulmonary effort is normal. No accessory muscle usage or respiratory distress.     Breath sounds: Normal breath sounds. No decreased breath sounds, wheezing or rhonchi.  Abdominal:     General: Abdomen is flat. Bowel sounds are normal. There is no distension.     Palpations: Abdomen is soft. There is no hepatomegaly or splenomegaly.     Tenderness: There is no abdominal tenderness.  Musculoskeletal:        General: Normal range of motion.     Cervical back: Normal range of motion and neck supple.     Right lower leg: No edema.     Left lower leg: No edema.  Lymphadenopathy:     Cervical: No cervical adenopathy.  Skin:    General: Skin is warm and dry.     Capillary Refill: Capillary refill takes less than 2 seconds.  Neurological:     Mental Status: He is alert and oriented to person, place, and time.     Deep Tendon Reflexes: Reflexes are normal and symmetric.     Comments: Not oriented to year, month, day of week. Is aware of his location.  Cannot recall president.  Psychiatric:        Attention and Perception: Attention normal.        Mood and Affect: Mood normal.        Speech: Speech normal.        Behavior: Behavior normal. Behavior is cooperative.        Thought Content: Thought content normal.    Results for orders placed or performed in visit on 04/04/24  CBC with Differential/Platelet   Collection Time: 04/04/24  9:09 AM  Result Value Ref Range   WBC 3.8 3.4 - 10.8 x10E3/uL   RBC 5.06 4.14 - 5.80 x10E6/uL    Hemoglobin 15.9 13.0 - 17.7 g/dL   Hematocrit 51.4 62.4 - 51.0 %   MCV 96 79 - 97 fL   MCH 31.4 26.6 - 33.0 pg   MCHC 32.8 31.5 - 35.7 g/dL   RDW 87.0 88.3 - 84.5 %   Platelets 211 150 - 450 x10E3/uL   Neutrophils 67 Not Estab. %   Lymphs 21 Not Estab. %   Monocytes 8 Not Estab. %   Eos 3 Not Estab. %   Basos 1 Not Estab. %   Neutrophils Absolute 2.5 1.4 - 7.0 x10E3/uL   Lymphocytes Absolute 0.8 0.7 - 3.1 x10E3/uL   Monocytes Absolute 0.3 0.1 - 0.9 x10E3/uL   EOS (ABSOLUTE) 0.1 0.0 - 0.4 x10E3/uL   Basophils Absolute 0.1 0.0 - 0.2 x10E3/uL   Immature Granulocytes 0 Not Estab. %   Immature Grans (Abs) 0.0 0.0 - 0.1 x10E3/uL  Comprehensive metabolic panel with GFR   Collection Time: 04/04/24  9:09 AM  Result Value Ref Range   Glucose 99 70 - 99 mg/dL   BUN 10 8 - 27 mg/dL   Creatinine, Ser 9.19 0.76 - 1.27 mg/dL   eGFR 93 >40 fO/fpw/8.26   BUN/Creatinine Ratio 13 10 - 24   Sodium 143 134 - 144 mmol/L   Potassium 4.6 3.5 - 5.2 mmol/L   Chloride 103 96 - 106 mmol/L   CO2 25 20 - 29 mmol/L   Calcium  9.2 8.6 - 10.2 mg/dL   Total Protein 6.4  6.0 - 8.5 g/dL   Albumin 4.3 3.8 - 4.8 g/dL   Globulin, Total 2.1 1.5 - 4.5 g/dL   Bilirubin Total 0.5 0.0 - 1.2 mg/dL   Alkaline Phosphatase 88 44 - 121 IU/L   AST 24 0 - 40 IU/L   ALT 24 0 - 44 IU/L  TSH   Collection Time: 04/04/24  9:09 AM  Result Value Ref Range   TSH 1.750 0.450 - 4.500 uIU/mL  PSA   Collection Time: 04/04/24  9:09 AM  Result Value Ref Range   Prostate Specific Ag, Serum 0.2 0.0 - 4.0 ng/mL  Lipid Panel w/o Chol/HDL Ratio   Collection Time: 04/04/24  9:09 AM  Result Value Ref Range   Cholesterol, Total 135 100 - 199 mg/dL   Triglycerides 848 (H) 0 - 149 mg/dL   HDL 54 >60 mg/dL   VLDL Cholesterol Cal 26 5 - 40 mg/dL   LDL Chol Calc (NIH) 55 0 - 99 mg/dL  VITAMIN D  25 Hydroxy (Vit-D Deficiency, Fractures)   Collection Time: 04/04/24  9:09 AM  Result Value Ref Range   Vit D, 25-Hydroxy 38.4 30.0 - 100.0 ng/mL   Vitamin B12   Collection Time: 04/04/24  9:09 AM  Result Value Ref Range   Vitamin B-12 1,039 232 - 1,245 pg/mL      Assessment & Plan:   Problem List Items Addressed This Visit       Cardiovascular and Mediastinum   Hypertension   Chronic, ongoing.  BP at goal today.  Continue current medication regimen and collaboration with cardiology.  Recommend he monitor BP at least a few mornings a week at home and document.  DASH diet at home.  Labs today: CMP.       Heart failure with reduced ejection fraction (HCC)   Chronic.  Diagnosed on 06/30/22 with EF 30-35%, recent echo August 2024 similar.  Followed by cardiology.  Euvolemic.  They have grant to assist in covering Entresto  and Jardiance . Recommend: - Reminded to call for an overnight weight gain of >2 pounds or a weekly weight gain of >5 pounds - not adding salt to food and read food labels. Reviewed the importance of keeping daily sodium intake to 2000mg  daily.  - No NSAIDS        Nervous and Auditory   Frontotemporal dementia (HCC) - Primary   Chronic, progressive.  Diagnosed 04/15/22, remains stable at this time.  Family history of dementia in mother and father.  Continue collaboration with neurology as needed and continue Aricept  5 MG, got persistent headaches with 10 MG.  Continue B12 supplement.        Other   Hyperlipidemia   Chronic, ongoing.  Continue Rosuvastatin  daily.  Adjust dose as needed.  Obtain labs today.      Relevant Orders   Comprehensive metabolic panel with GFR   Lipid Panel w/o Chol/HDL Ratio    I personally spent a total of 25 minutes in the care of the patient today including preparing to see the patient, getting/reviewing separately obtained history, performing a medically appropriate exam/evaluation, counseling and educating, and placing orders.   Follow up plan: Return in about 6 months (around 04/04/2025) for Annual Physical after 04/03/25.

## 2024-10-05 NOTE — Assessment & Plan Note (Signed)
 Chronic, ongoing.  Continue Rosuvastatin  daily.  Adjust dose as needed.  Obtain labs today.

## 2024-10-05 NOTE — Assessment & Plan Note (Signed)
 Chronic, progressive.  Diagnosed 04/15/22, remains stable at this time.  Family history of dementia in mother and father.  Continue collaboration with neurology as needed and continue Aricept  5 MG, got persistent headaches with 10 MG.  Continue B12 supplement.

## 2024-10-05 NOTE — Assessment & Plan Note (Signed)
 Chronic.  Diagnosed on 06/30/22 with EF 30-35%, recent echo August 2024 similar.  Followed by cardiology.  Euvolemic.  They have grant to assist in covering Entresto  and Jardiance . Recommend: - Reminded to call for an overnight weight gain of >2 pounds or a weekly weight gain of >5 pounds - not adding salt to food and read food labels. Reviewed the importance of keeping daily sodium intake to 2000mg  daily.  - No NSAIDS

## 2024-10-05 NOTE — Assessment & Plan Note (Signed)
 Chronic, ongoing.  BP at goal today.  Continue current medication regimen and collaboration with cardiology.  Recommend he monitor BP at least a few mornings a week at home and document.  DASH diet at home.  Labs today: CMP.

## 2024-10-06 ENCOUNTER — Ambulatory Visit: Payer: Self-pay | Admitting: Nurse Practitioner

## 2024-10-06 LAB — COMPREHENSIVE METABOLIC PANEL WITH GFR
ALT: 22 IU/L (ref 0–44)
AST: 22 IU/L (ref 0–40)
Albumin: 4.5 g/dL (ref 3.8–4.8)
Alkaline Phosphatase: 87 IU/L (ref 47–123)
BUN/Creatinine Ratio: 15 (ref 10–24)
BUN: 13 mg/dL (ref 8–27)
Bilirubin Total: 0.6 mg/dL (ref 0.0–1.2)
CO2: 26 mmol/L (ref 20–29)
Calcium: 9.4 mg/dL (ref 8.6–10.2)
Chloride: 102 mmol/L (ref 96–106)
Creatinine, Ser: 0.86 mg/dL (ref 0.76–1.27)
Globulin, Total: 1.8 g/dL (ref 1.5–4.5)
Glucose: 101 mg/dL — ABNORMAL HIGH (ref 70–99)
Potassium: 4.3 mmol/L (ref 3.5–5.2)
Sodium: 141 mmol/L (ref 134–144)
Total Protein: 6.3 g/dL (ref 6.0–8.5)
eGFR: 91 mL/min/1.73 (ref >59)

## 2024-10-06 LAB — LIPID PANEL W/O CHOL/HDL RATIO
Cholesterol, Total: 122 mg/dL (ref 100–199)
HDL: 53 mg/dL (ref >39)
LDL Chol Calc (NIH): 47 mg/dL (ref 0–99)
Triglycerides: 124 mg/dL (ref 0–149)
VLDL Cholesterol Cal: 22 mg/dL (ref 5–40)

## 2024-10-06 NOTE — Progress Notes (Signed)
 Contacted via MyChart  Good morning Christopher Huang, your labs have returned: - Kidney function, creatinine and eGFR, remains normal, as is liver function, AST and ALT.  - Lipid panel with levels at goal. No medication changes needed. Great news!! Any questions? Keep being amazing!!  Thank you for allowing me to participate in your care.  I appreciate you. Kindest regards, Clara Herbison

## 2024-10-17 DIAGNOSIS — H26493 Other secondary cataract, bilateral: Secondary | ICD-10-CM | POA: Diagnosis not present

## 2024-10-17 DIAGNOSIS — H524 Presbyopia: Secondary | ICD-10-CM | POA: Diagnosis not present

## 2024-10-17 DIAGNOSIS — H35373 Puckering of macula, bilateral: Secondary | ICD-10-CM | POA: Diagnosis not present

## 2024-10-17 DIAGNOSIS — H35033 Hypertensive retinopathy, bilateral: Secondary | ICD-10-CM | POA: Diagnosis not present

## 2024-10-17 DIAGNOSIS — H3554 Dystrophies primarily involving the retinal pigment epithelium: Secondary | ICD-10-CM | POA: Diagnosis not present

## 2024-11-02 DIAGNOSIS — D0462 Carcinoma in situ of skin of left upper limb, including shoulder: Secondary | ICD-10-CM | POA: Diagnosis not present

## 2025-02-22 ENCOUNTER — Ambulatory Visit: Admitting: Student

## 2025-03-29 ENCOUNTER — Ambulatory Visit

## 2025-04-05 ENCOUNTER — Encounter: Admitting: Nurse Practitioner
